# Patient Record
Sex: Female | Born: 1955
Health system: Southern US, Community
[De-identification: ages and names within clinical notes are randomized; demographics above are authoritative.]

## PROBLEM LIST (undated history)

## (undated) DIAGNOSIS — R269 Unspecified abnormalities of gait and mobility: Secondary | ICD-10-CM

## (undated) DIAGNOSIS — K649 Unspecified hemorrhoids: Secondary | ICD-10-CM

## (undated) DIAGNOSIS — R7989 Other specified abnormal findings of blood chemistry: Secondary | ICD-10-CM

## (undated) DIAGNOSIS — E785 Hyperlipidemia, unspecified: Secondary | ICD-10-CM

## (undated) DIAGNOSIS — T7840XA Allergy, unspecified, initial encounter: Secondary | ICD-10-CM

## (undated) DIAGNOSIS — K5790 Diverticulosis of intestine, part unspecified, without perforation or abscess without bleeding: Secondary | ICD-10-CM

## (undated) DIAGNOSIS — K635 Polyp of colon: Secondary | ICD-10-CM

## (undated) DIAGNOSIS — J309 Allergic rhinitis, unspecified: Secondary | ICD-10-CM

## (undated) DIAGNOSIS — E119 Type 2 diabetes mellitus without complications: Secondary | ICD-10-CM

## (undated) DIAGNOSIS — R945 Abnormal results of liver function studies: Secondary | ICD-10-CM

## (undated) DIAGNOSIS — E669 Obesity, unspecified: Secondary | ICD-10-CM

## (undated) DIAGNOSIS — E559 Vitamin D deficiency, unspecified: Secondary | ICD-10-CM

## (undated) DIAGNOSIS — G43909 Migraine, unspecified, not intractable, without status migrainosus: Secondary | ICD-10-CM

## (undated) DIAGNOSIS — K754 Autoimmune hepatitis: Secondary | ICD-10-CM

## (undated) DIAGNOSIS — K759 Inflammatory liver disease, unspecified: Secondary | ICD-10-CM

## (undated) DIAGNOSIS — I10 Essential (primary) hypertension: Secondary | ICD-10-CM

## (undated) DIAGNOSIS — G35 Multiple sclerosis: Secondary | ICD-10-CM

## (undated) DIAGNOSIS — F419 Anxiety disorder, unspecified: Secondary | ICD-10-CM

## (undated) HISTORY — DX: Inflammatory liver disease, unspecified: K75.9

## (undated) HISTORY — DX: Vitamin D deficiency, unspecified: E55.9

## (undated) HISTORY — PX: ABDOMINAL HYSTERECTOMY: SHX81

## (undated) HISTORY — PX: OTHER SURGICAL HISTORY: SHX169

## (undated) HISTORY — DX: Hyperlipidemia, unspecified: E78.5

## (undated) HISTORY — DX: Allergy, unspecified, initial encounter: T78.40XA

## (undated) HISTORY — DX: Other specified abnormal findings of blood chemistry: R79.89

## (undated) HISTORY — DX: Abnormal results of liver function studies: R94.5

## (undated) HISTORY — DX: Unspecified abnormalities of gait and mobility: R26.9

## (undated) HISTORY — DX: Anxiety disorder, unspecified: F41.9

## (undated) HISTORY — DX: Polyp of colon: K63.5

## (undated) HISTORY — DX: Migraine, unspecified, not intractable, without status migrainosus: G43.909

## (undated) HISTORY — PX: TONSILLECTOMY AND ADENOIDECTOMY: SHX28

## (undated) HISTORY — DX: Type 2 diabetes mellitus without complications: E11.9

## (undated) HISTORY — DX: Unspecified hemorrhoids: K64.9

## (undated) HISTORY — DX: Multiple sclerosis: G35

## (undated) HISTORY — DX: Essential (primary) hypertension: I10

## (undated) HISTORY — DX: Allergic rhinitis, unspecified: J30.9

## (undated) HISTORY — DX: Diverticulosis of intestine, part unspecified, without perforation or abscess without bleeding: K57.90

## (undated) HISTORY — DX: Obesity, unspecified: E66.9

## (undated) HISTORY — DX: Autoimmune hepatitis: K75.4

---

## 2000-06-17 ENCOUNTER — Encounter: Payer: Self-pay | Admitting: Obstetrics and Gynecology

## 2000-06-17 ENCOUNTER — Encounter: Admission: RE | Admit: 2000-06-17 | Discharge: 2000-06-17 | Payer: Self-pay | Admitting: Obstetrics and Gynecology

## 2000-12-02 ENCOUNTER — Other Ambulatory Visit: Admission: RE | Admit: 2000-12-02 | Discharge: 2000-12-02 | Payer: Self-pay | Admitting: Obstetrics and Gynecology

## 2000-12-05 ENCOUNTER — Ambulatory Visit (HOSPITAL_COMMUNITY): Admission: RE | Admit: 2000-12-05 | Discharge: 2000-12-05 | Payer: Self-pay | Admitting: Obstetrics and Gynecology

## 2000-12-05 ENCOUNTER — Encounter: Payer: Self-pay | Admitting: Obstetrics and Gynecology

## 2001-09-25 ENCOUNTER — Encounter: Admission: RE | Admit: 2001-09-25 | Discharge: 2001-09-25 | Payer: Self-pay | Admitting: Family Medicine

## 2001-09-25 ENCOUNTER — Encounter: Payer: Self-pay | Admitting: Family Medicine

## 2002-01-19 ENCOUNTER — Encounter: Payer: Self-pay | Admitting: Obstetrics and Gynecology

## 2002-01-19 ENCOUNTER — Encounter: Admission: RE | Admit: 2002-01-19 | Discharge: 2002-01-19 | Payer: Self-pay | Admitting: *Deleted

## 2002-01-29 ENCOUNTER — Other Ambulatory Visit: Admission: RE | Admit: 2002-01-29 | Discharge: 2002-01-29 | Payer: Self-pay | Admitting: Obstetrics and Gynecology

## 2002-03-25 ENCOUNTER — Encounter: Payer: Self-pay | Admitting: Gastroenterology

## 2002-03-25 ENCOUNTER — Ambulatory Visit (HOSPITAL_COMMUNITY): Admission: RE | Admit: 2002-03-25 | Discharge: 2002-03-25 | Payer: Self-pay | Admitting: Gastroenterology

## 2002-07-26 ENCOUNTER — Encounter: Admission: RE | Admit: 2002-07-26 | Discharge: 2002-07-26 | Payer: Self-pay | Admitting: Family Medicine

## 2002-07-26 ENCOUNTER — Encounter: Payer: Self-pay | Admitting: Family Medicine

## 2003-02-14 ENCOUNTER — Encounter: Admission: RE | Admit: 2003-02-14 | Discharge: 2003-02-14 | Payer: Self-pay | Admitting: Family Medicine

## 2003-02-14 ENCOUNTER — Encounter: Payer: Self-pay | Admitting: Family Medicine

## 2004-02-16 ENCOUNTER — Encounter: Admission: RE | Admit: 2004-02-16 | Discharge: 2004-02-16 | Payer: Self-pay | Admitting: Obstetrics and Gynecology

## 2004-06-13 ENCOUNTER — Inpatient Hospital Stay (HOSPITAL_COMMUNITY): Admission: RE | Admit: 2004-06-13 | Discharge: 2004-06-15 | Payer: Self-pay | Admitting: Obstetrics and Gynecology

## 2004-06-13 ENCOUNTER — Encounter (INDEPENDENT_AMBULATORY_CARE_PROVIDER_SITE_OTHER): Payer: Self-pay | Admitting: Specialist

## 2005-02-21 ENCOUNTER — Encounter: Admission: RE | Admit: 2005-02-21 | Discharge: 2005-02-21 | Payer: Self-pay | Admitting: Family Medicine

## 2005-04-05 ENCOUNTER — Encounter: Admission: RE | Admit: 2005-04-05 | Discharge: 2005-04-05 | Payer: Self-pay | Admitting: Obstetrics and Gynecology

## 2005-08-21 ENCOUNTER — Encounter: Admission: RE | Admit: 2005-08-21 | Discharge: 2005-08-21 | Payer: Self-pay | Admitting: Occupational Medicine

## 2006-04-07 ENCOUNTER — Encounter: Admission: RE | Admit: 2006-04-07 | Discharge: 2006-04-07 | Payer: Self-pay | Admitting: Obstetrics and Gynecology

## 2007-05-13 ENCOUNTER — Encounter: Admission: RE | Admit: 2007-05-13 | Discharge: 2007-05-13 | Payer: Self-pay | Admitting: Family Medicine

## 2007-06-17 ENCOUNTER — Emergency Department (HOSPITAL_COMMUNITY): Admission: EM | Admit: 2007-06-17 | Discharge: 2007-06-17 | Payer: Self-pay | Admitting: Emergency Medicine

## 2007-12-18 ENCOUNTER — Encounter: Admission: RE | Admit: 2007-12-18 | Discharge: 2007-12-18 | Payer: Self-pay | Admitting: Family Medicine

## 2008-05-25 ENCOUNTER — Encounter: Admission: RE | Admit: 2008-05-25 | Discharge: 2008-05-25 | Payer: Self-pay | Admitting: Family Medicine

## 2008-06-27 ENCOUNTER — Emergency Department (HOSPITAL_COMMUNITY): Admission: EM | Admit: 2008-06-27 | Discharge: 2008-06-27 | Payer: Self-pay | Admitting: Family Medicine

## 2009-03-29 ENCOUNTER — Ambulatory Visit: Payer: Self-pay | Admitting: Internal Medicine

## 2009-03-29 DIAGNOSIS — R7401 Elevation of levels of liver transaminase levels: Secondary | ICD-10-CM | POA: Insufficient documentation

## 2009-03-29 DIAGNOSIS — Z860100 Personal history of colon polyps, unspecified: Secondary | ICD-10-CM | POA: Insufficient documentation

## 2009-03-29 DIAGNOSIS — R740 Nonspecific elevation of levels of transaminase and lactic acid dehydrogenase [LDH]: Secondary | ICD-10-CM

## 2009-03-29 DIAGNOSIS — K589 Irritable bowel syndrome without diarrhea: Secondary | ICD-10-CM

## 2009-03-29 DIAGNOSIS — K7689 Other specified diseases of liver: Secondary | ICD-10-CM

## 2009-04-11 LAB — CONVERTED CEMR LAB
Iron: 63 ug/dL (ref 42–145)
Saturation Ratios: 24.8 % (ref 20.0–50.0)
Transferrin: 181.1 mg/dL — ABNORMAL LOW (ref 212.0–360.0)

## 2009-06-05 ENCOUNTER — Emergency Department (HOSPITAL_COMMUNITY): Admission: EM | Admit: 2009-06-05 | Discharge: 2009-06-05 | Payer: Self-pay | Admitting: Family Medicine

## 2009-06-07 ENCOUNTER — Encounter: Admission: RE | Admit: 2009-06-07 | Discharge: 2009-06-07 | Payer: Self-pay | Admitting: Family Medicine

## 2009-09-27 ENCOUNTER — Emergency Department (HOSPITAL_COMMUNITY): Admission: EM | Admit: 2009-09-27 | Discharge: 2009-09-27 | Payer: Self-pay | Admitting: Family Medicine

## 2010-02-10 ENCOUNTER — Emergency Department (HOSPITAL_COMMUNITY): Admission: EM | Admit: 2010-02-10 | Discharge: 2010-02-10 | Payer: Self-pay | Admitting: Emergency Medicine

## 2010-06-08 ENCOUNTER — Encounter: Admission: RE | Admit: 2010-06-08 | Discharge: 2010-06-08 | Payer: Self-pay | Admitting: Family Medicine

## 2010-07-18 ENCOUNTER — Encounter (INDEPENDENT_AMBULATORY_CARE_PROVIDER_SITE_OTHER): Payer: Self-pay | Admitting: *Deleted

## 2010-07-30 ENCOUNTER — Encounter (INDEPENDENT_AMBULATORY_CARE_PROVIDER_SITE_OTHER): Payer: Self-pay | Admitting: *Deleted

## 2010-08-01 ENCOUNTER — Ambulatory Visit: Payer: Self-pay | Admitting: Internal Medicine

## 2010-08-28 ENCOUNTER — Ambulatory Visit: Payer: Self-pay | Admitting: Internal Medicine

## 2010-08-29 ENCOUNTER — Encounter: Payer: Self-pay | Admitting: Internal Medicine

## 2010-11-01 NOTE — Letter (Signed)
Summary: [facility] Instructions  [facility]  [address] [place], [place] [address]   Phone: [phone]  Fax: [phone]       Gabriela Rice    [birth year]    MRN: [id number]        Procedure Day /Date: Tuesday 08/28/1010     Arrival Time: 10:00 am      Procedure Time: 11:00 am     Location of Procedure:                    _x _  [facility] (4th Floor)                        PREPARATION FOR COLONOSCOPY WITH MOVIPREP   Starting 5 days prior to your procedure Thursday 11/24 do not eat nuts, seeds, popcorn, corn, beans, peas,  salads, or any raw vegetables.  Do not take any fiber supplements (e.g. Metamucil, Citrucel, and Benefiber).  THE DAY BEFORE YOUR PROCEDURE         DATE: Monday 11/28  1.  Drink clear liquids the entire day-NO SOLID FOOD  2.  Do not drink anything colored red or purple.  Avoid juices with pulp.  No orange juice.  3.  Drink at least 64 oz. (8 glasses) of fluid/clear liquids during the day to prevent dehydration and help the prep work efficiently.  CLEAR LIQUIDS INCLUDE: Water Jello Ice Popsicles Tea (sugar ok, no milk/cream) Powdered fruit flavored drinks Coffee (sugar ok, no milk/cream) Gatorade Juice: apple, white grape, white cranberry  Lemonade Clear bullion, consomm, broth Carbonated beverages (any kind) Strained chicken noodle soup Hard Candy                             4.  In the morning, mix first dose of MoviPrep solution:    Empty 1 Pouch A and 1 Pouch B into the disposable container    Add lukewarm drinking water to the top line of the container. Mix to dissolve    Refrigerate (mixed solution should be used within 24 hrs)  5.  Begin drinking the prep at 5:00 p.m. The MoviPrep container is divided by 4 marks.   Every 15 minutes drink the solution down to the next mark (approximately 8 oz) until the full liter is complete.   6.  Follow completed prep with 16 oz of clear liquid of your choice  (Nothing red or purple).  Continue to drink clear liquids until bedtime.  7.  Before going to bed, mix second dose of MoviPrep solution:    Empty 1 Pouch A and 1 Pouch B into the disposable container    Add lukewarm drinking water to the top line of the container. Mix to dissolve    Refrigerate  THE DAY OF YOUR PROCEDURE      DATE: Tuesday 11/29  Beginning at 6:00 a.m. (5 hours before procedure):         1. Every 15 minutes, drink the solution down to the next mark (approx 8 oz) until the full liter is complete.  2. Follow completed prep with 16 oz. of clear liquid of your choice.    3. You may drink clear liquids until 9:00 am (2 HOURS BEFORE PROCEDURE).   MEDICATION INSTRUCTIONS  Unless otherwise instructed, you should take regular prescription medications with a small sip of water   as early as possible the morning of your  procedure.           OTHER INSTRUCTIONS  You will need a responsible adult at least 55 years of age to accompany you and drive you home.   This person must remain in the waiting room during your procedure.  Wear loose fitting clothing that is easily removed.  Leave jewelry and other valuables at home.  However, you may wish to bring a book to read or  an iPod/MP3 player to listen to music as you wait for your procedure to start.  Remove all body piercing jewelry and leave at home.  Total time from sign-in until discharge is approximately 2-3 hours.  You should go home directly after your procedure and rest.  You can resume normal activities the  day after your procedure.  The day of your procedure you should not:   Drive   Make legal decisions   Operate machinery   Drink alcohol   Return to work  You will receive specific instructions about eating, activities and medications before you leave.    The above instructions have been reviewed and explained to me by   Ezra Sites RN  August 01, 2010 4:52 PM     I fully understand  and can verbalize these instructions _____________________________ Date _________

## 2010-11-01 NOTE — Miscellaneous (Signed)
Summary: [facility]  Clinical Lists Changes  Medications: Added new medication of MOVIPREP 100 GM  SOLR (PEG-KCL-NACL-NASULF-NA ASC-C) As per prep instructions. - Signed Rx of MOVIPREP 100 GM  SOLR (PEG-KCL-NACL-NASULF-NA ASC-C) As per prep instructions.;  #1 x 0;  Signed;  Entered by: Ezra Sites RN;  Authorized by: Hilarie Fredrickson MD;  Method used: Electronically to [facility]*, [address] Shipping/mailing, [place], [place]  [address], Ph: [phone], Fax: [phone] Allergies: Changed allergy or adverse reaction from CODEINE to CODEINE Changed allergy or adverse reaction from PENICILLIN to PENICILLIN    Prescriptions: MOVIPREP 100 GM  SOLR (PEG-KCL-NACL-NASULF-NA ASC-C) As per prep instructions.  #1 x 0   Entered by:   Ezra Sites RN   Authorized by:   Hilarie Fredrickson MD   Signed by:   Ezra Sites RN on 08/01/2010   Method used:   Electronically to        [facility]* (retail)       [address]       [address] Shipping/mailing       [place], [place]  [address]       Ph: [phone]       Fax: [phone]   RxID:   [id number]   Appended Document: Colonoscopy     Procedures Next Due Date:    Colonoscopy: 07/2020  Appended Document: Patient Notice- Polyp Results Letter mailed

## 2010-11-01 NOTE — Procedures (Signed)
Summary: Colonoscopy  Patient: Gabriela Rice Note: All result statuses are Final unless otherwise noted.  Tests: (1) Colonoscopy (COL)   COL Colonoscopy           DONE     [facility]     [address]     [place], [place]  [address]           COLONOSCOPY PROCEDURE REPORT           PATIENT:  Gabriela Rice, Gabriela Rice  MR#:  [id number]     BIRTHDATE:  1956/02/17, 54 yrs. old  GENDER:  female     ENDOSCOPIST:  Wilhemina Bonito. Eda Keys, MD     REF. BY:  Surveillance Program Recall     PROCEDURE DATE:  08/28/2010     PROCEDURE:  Colonoscopy with biopsy,     Colonoscopy with snare polypectomy     x 3     ASA CLASS:  Class II     INDICATIONS:  history of polyps, family history of colon cancer     (aunt), surveillance and high-risk screening ; index exam 07-2004     w/ NAP     MEDICATIONS:   Fentanyl 75 mcg IV, Versed 9 mg IV           DESCRIPTION OF PROCEDURE:   After the risks benefits and     alternatives of the procedure were thoroughly explained, informed     consent was obtained.  Digital rectal exam was performed and     revealed no abnormalities.   The LB 180AL [id number] endoscope was     introduced through the anus and advanced to the cecum, which was     identified by both the appendix and ileocecal valve, without     limitations.Time to cecum = 2:41 min. The quality of the prep was     excellent, using MoviPrep.  The instrument was then slowly     withdrawn (time = 15:47 min) as the colon was fully examined.     <<PROCEDUREIMAGES>>           FINDINGS:  Three polyps, all < 5 mm,  were found in the ascending     colon. Polyps were snared without cautery. Retrieval was     successful. p  A polypoid lesion which appearred to be an inverted     diverticulum was found in the transverse colon. the tip was bx for     confirmation.  Mild diverticulosis was found in the left colon.     Retroflexed views in the rectum revealed internal hemorrhoids.     The scope was then withdrawn from  the patient and the procedure     completed.           COMPLICATIONS:  None     ENDOSCOPIC IMPRESSION:     1) Three polyps in the ascending colon - removed     2) Probable inverted Diverticulum in the transverse colon - bx     3) Mild diverticulosis in the left colon     4) Internal hemorrhoids           RECOMMENDATIONS:     1) Follow up colonoscopy pending pathology results; likely in 5     years           ______________________________     Wilhemina Bonito. Eda Keys, MD           CC:  Shaune Pollack, MD;  The Patient  n.     eSIGNED:   Wilhemina Bonito. Eda Keys at 08/28/2010 12:11 PM           Janyth Pupa, [id number]  Note: An exclamation mark (!) indicates a result that was not dispersed into the flowsheet. Document Creation Date: 08/28/2010 12:11 PM _______________________________________________________________________  (1) Order result status: Final Collection or observation date-time: 08/28/2010 11:52 Requested date-time:  Receipt date-time:  Reported date-time:  Referring Physician:   Ordering Physician: Fransico Setters [phone]) Specimen Source:  Source: Launa Grill Order Number: [id number] Lab site:   Appended Document: Colonoscopy     Procedures Next Due Date:    Colonoscopy: 07/2020

## 2010-11-01 NOTE — Letter (Signed)
Summary: Patient Notice- Polyp Results  [facility]  [address] [place], [place] [address]   Phone: [phone]  Fax: [phone]        August 29, 2010 MRN: [id number]    [facility] [address] [place], [place]  [address]    Dear Ms. Ha,  I am pleased to inform you that the colon polyps removed during your recent colonoscopy were found to be benign (no cancer detected) upon pathologic examination. None of the polyps were precancerous. The single biopsy was also benign.  I recommend you have a repeat colonoscopy examination in 10 years since none of the polyps were precancerous.  Should you develop new or worsening symptoms of abdominal pain, bowel habit changes or bleeding from the rectum or bowels, please schedule an evaluation with either your primary care physician or with me.  Additional information/recommendations:  __ No further action with gastroenterology is needed at this time. Please      follow-up with your primary care physician for your other healthcare      needs.    Please call us if you are having persistent problems or have questions about your condition that have not been fully answered at this time.  Sincerely,  Hilarie Fredrickson MD  This letter has been electronically signed by your physician.  Appended Document: Patient Notice- Polyp Results Letter mailed

## 2010-11-01 NOTE — Letter (Signed)
Summary: Pre Visit Letter Revised  [facility]  [address] [place], [place] [address]   Phone: [phone]  Fax: [phone]        07/18/2010 MRN: [id number] Gabriela Rice [address] [place], [place]  [address]             Procedure Date:  08/28/2010   Welcome to the Gastroenterology Division at [facility].    You are scheduled to see a nurse for your pre-procedure visit on 08/01/2010 at 4:30PM on the 3rd floor at [facility], [address]  We ask that you try to arrive at our office 15 minutes prior to your appointment time to allow for check-in.  Please take a minute to review the attached form.  If you answer "Yes" to one or more of the questions on the first page, we ask that you call the person listed at your earliest opportunity.  If you answer "No" to all of the questions, please complete the rest of the form and bring it to your appointment.    Your nurse visit will consist of discussing your medical and surgical history, your immediate family medical history, and your medications.   If you are unable to list all of your medications on the form, please bring the medication bottles to your appointment and we will list them.  We will need to be aware of both prescribed and over the counter drugs.  We will need to know exact dosage information as well.    Please be prepared to read and sign documents such as consent forms, a financial agreement, and acknowledgement forms.  If necessary, and with your consent, a friend or relative is welcome to sit-in on the nurse visit with you.  Please bring your insurance card so that we may make a copy of it.  If your insurance requires a referral to see a specialist, please bring your referral form from your primary care physician.  No co-pay is required for this nurse visit.     If you cannot keep your appointment, please call [phone] to cancel or reschedule prior to your appointment date.  This  allows [place] the opportunity to schedule an appointment for another patient in need of care.    Thank you for choosing [facility] for your medical needs.  We appreciate the opportunity to care for you.  Please visit [place] at our website  to learn more about our practice.  Sincerely, The Gastroenterology Division

## 2010-12-18 LAB — URINE CULTURE

## 2010-12-18 LAB — POCT URINALYSIS DIP (DEVICE)
Bilirubin Urine: NEGATIVE
Glucose, UA: NEGATIVE mg/dL
Ketones, ur: NEGATIVE mg/dL
Nitrite: NEGATIVE
Protein, ur: 30 mg/dL — AB
Specific Gravity, Urine: 1.025 (ref 1.005–1.030)
pH: 5.5 (ref 5.0–8.0)

## 2011-01-04 LAB — URINE CULTURE: Colony Count: >=100000

## 2011-01-04 LAB — POCT URINALYSIS DIP (DEVICE)
Bilirubin Urine: NEGATIVE
Glucose, UA: 100 mg/dL — AB
Ketones, ur: NEGATIVE mg/dL
Nitrite: POSITIVE — AB
Specific Gravity, Urine: 1.015 (ref 1.005–1.030)

## 2011-02-15 NOTE — H&P (Signed)
NAME:  Gabriela Rice, Gabriela Rice                       ACCOUNT NO.:  [id number]   MEDICAL RECORD NO.:  [id number]                   PATIENT TYPE:  INP   LOCATION:  NA                                   FACILITY:  [facility]   PHYSICIAN:  Malachi Pro. Ambrose Mantle, M.D.              DATE OF BIRTH:  [birth year]   DATE OF ADMISSION:  06/13/2004  DATE OF DISCHARGE:                                HISTORY & PHYSICAL   HISTORY OF PRESENT ILLNESS:  This is a 55 year old white female para 2-0-2-  2, who is admitted to the hospital for abdominal hysterectomy and bilateral  salpingo-oophorectomy because of leiomyomata uteri, menorrhagia, and  dysmenorrhea.  Last menstrual period was June 05, 2004.  Periods occur  at approximately 22 to 27 day intervals,  last seven days, and are heavy.  She states that she uses 8 to 10 pads plus 8 to 10 tampons per day.  She  uses two overnight pads at night and gets up to change at night and passes  egg sized clots.  She rates her menstrual cramps at 7 to 8/10.  An  ultrasound done on May 15, 2005, showed multiple uterine fibroids.  The  uterus measured 14.4 x 7.4 x 9.3 cm.  The patient has undergone a previous  multiple myomectomy and is admitted now for hysterectomy.   ALLERGIES:  1.  PENICILLIN.  2.  CODEINE.   PAST SURGICAL HISTORY:  1.  Two cesarean sections.  2.  Multiple myomectomies.  3.  Two terminations of pregnancy.   PAST MEDICAL HISTORY:  1.  Multiple sclerosis.  2.  Elevated cholesterol.  3.  Chronic liver problems that have been attributed to fatty liver.  She      had a liver biopsy and has abnormal liver function tests.   She reports that her sex drive was an 6/06, at age 6, continued to be 8/10  at 65, but now is a 3/10.  She feels like the decreased sex drive is mainly  related to fatigue, quite likely related to multiple sclerosis.   MEDICATIONS:  1.  ___________.  2.  Xanax.  3.  Advil.  4.  Benadryl.   FAMILY HISTORY:  Mother 21 with  multiple sclerosis.  Father died at 68 of  cirrhosis of the liver.  Two sisters, one with hepatitis C, one brother is  in normal health, but is disabled with back problems.   PHYSICAL EXAMINATION:  GENERAL:  A well-developed, somewhat obese, white  female in no acute distress.  VITAL SIGNS:  Weight 215.25 pounds, blood pressure 130/80, pulse 80.  HEENT:  She has a narrow mouth, no major abnormalities of the mouth.  Extraocular movements were intact.  Nose and pharynx are clear.  NECK:  Supple without thyromegaly.  BREASTS:  Soft without masses.  LUNGS:  Clear to auscultation and percussion.  HEART:  Normal size and sounds, no murmurs.  ABDOMEN:  Soft.  There is a lower abdominal mass palpable and a midline  abdominal scar.  PELVIC:  The vulva and vagina are clean.  The cervix is clean.  A slight  amount of menstrual blood present.  Uterus is hard to feel vaginally.  The  adnexa are clear.  RECTAL:  Rectal examination done in August was negative.   LABORATORY DATA:  Pap smear on May 10, 2004, was negative for  intraepithelial lesion and malignancy.   IMPRESSION:  1.  Leiomyomata.  2.  Menorrhagia.  3.  Dysmenorrhea.   PLAN:  The patient is admitted for abdominal hysterectomy and bilateral  salpingo-oophorectomy.  She understands the risks of surgery, included but  are not limited to heart attack, stroke, pulmonary embolus, wound  disruption, hemorrhage with need for re-operation and/or transfusion,  fistula formation, nerve injury, and intestinal obstruction.  She  understands and agrees to proceed.                                               Malachi Pro. Ambrose Mantle, M.D.    TFH/MEDQ  D:  06/12/2004  T:  06/12/2004  Job:  [id number]

## 2011-02-15 NOTE — Discharge Summary (Signed)
NAME:  Gabriela Rice, Gabriela Rice                       ACCOUNT NO.:  [id number]   MEDICAL RECORD NO.:  [id number]                   PATIENT TYPE:  INP   LOCATION:  0454                                 FACILITY:  [facility]   PHYSICIAN:  Malachi Pro. Ambrose Mantle, M.D.              DATE OF BIRTH:  1956/07/02   DATE OF ADMISSION:  06/13/2004  DATE OF DISCHARGE:  06/15/2004                                 DISCHARGE SUMMARY   This is a 55 year old white female admitted for abdominal  hysterectomy/bilateral salpingo-oophorectomy because of leiomyomata uteri,  dysmenorrhea, and menorrhagia.  She underwent that procedure with division  of pelvic adhesions on June 13, 2004, did well postoperatively, and was  discharged on the second postoperative day.  On the first postoperative day,  she did have a temperature maximum of 100.4 degrees.  She was asked to  increase her pulmonary toilet with incentive spirometry and with coughing  and deep breathing.  She has remained afebrile now and is ready for  discharge although she is strongly encouraged to watch her temperature four  times a day and call if it is above 100.4 degrees.  Her abdomen is soft and  nontender.  She has not passed flatus.  She is told to take clear liquids or  full liquids until she is passing flatus and she may use a Dulcolax  suppository.  Initial hemoglobin 11, hematocrit 33.5, white count 6800,  platelet count 322,000.  There is a normal differential.  A comprehensive  metabolic profile showed a glucose of 120, creatinine of 0.8, AST of 61, ALT  of 72 but the patient has had abnormal liver function tests.  She has been  evaluated by Dr. Kinnie Scales and had a liver biopsy that showed fatty liver.  Urinalysis was negative.  An EKG showed a moderate voltage criteria for LVH,  borderline EKG.  No previous tracing.  Pathology report showed a 566 g  uterus with benign cervix and chronic cervicitis, squamous metaplasia and  focal microglandular  hyperplasia, benign proliferative endometrium and no  hyperplasia, leiomyomata uteri, serosal fibrous adhesions with focal  endosalpingiosis.  Bilateral ovaries and fallopian tubes with benign ovarian  follicular cysts, benign ovarian serous cystadenoma, benign paratubal cysts,  and fibrous adhesions.   FINAL DIAGNOSES:  1.  Leiomyomata uteri.  2.  Menorrhagia.  3.  Dysmenorrhea.  4.  Serous cystadenoma of the ovary.   OPERATION:  Abdominal hysterectomy, bilateral salpingo-oophorectomy,  division of adhesions.   FINAL CONDITION:  Improved.   INSTRUCTIONS:  1.  Liquid diet until passing flatus.  2.  Call with any temperature elevation greater than 100.4 or 100.6 degrees.  3.  Call with any unusual problems.  4.  No heavy lifting or strenuous activity.  5.  No vaginal entrance.   DISCHARGE MEDICATIONS:  Mepergan Fortis 24 tablets 1 every 4-6 hours as  needed for pain and the patient is advised to continue her medications she  takes at home.   She is to return in five days to have her staples removed.      TFH/MEDQ  D:  06/15/2004  T:  06/16/2004  Job:  [id number]

## 2011-02-15 NOTE — Op Note (Signed)
NAME:  Gabriela Rice, Gabriela Rice                       ACCOUNT NO.:  [id number]   MEDICAL RECORD NO.:  [id number]                   PATIENT TYPE:  INP   LOCATION:  0454                                 FACILITY:  [facility]   PHYSICIAN:  Malachi Pro. Ambrose Mantle, M.D.              DATE OF BIRTH:  06/08/1956   DATE OF PROCEDURE:  06/13/2004  DATE OF DISCHARGE:                                 OPERATIVE REPORT   PREOPERATIVE DIAGNOSES:  1.  Leiomyoma uteri.  2.  Dysmenorrhea.  3.  Menorrhagia.   POSTOPERATIVE DIAGNOSES:  1.  Leiomyoma uteri.  2.  Dysmenorrhea.  3.  Menorrhagia.  4.  Some pelvic adhesions.   OPERATION:  Abdominal hysterectomy, bilateral salpingo-oophorectomy.   SURGEON:  Malachi Pro. Ambrose Mantle, M.D.   ASSISTANT:  Zenaida Niece, M.D.   General anesthesia.   The patient was brought to the operating room and placed under satisfactory  general anesthesia.  The abdomen, vulva, vagina, and urethra were prepped  with Betadine solution and a Foley catheter was inserted to straight drain.  Exam revealed a mass that extended to the umbilicus; otherwise, I could not  feel the pelvic structures well.  The cervix itself was closed.  The patient  was placed supine.  The patient had a prior midline incision.  Incision was  made through this old scar from the lower abdominal crease all the way  almost to the umbilicus.  The subcutaneous tissue was separated from the  fascia throughout the length of the incision.  The fascia was incised, the  midline was identified and developed.  The peritoneum was opened vertically.  Exploration of the upper abdomen revealed the liver to be smooth.  I did not  feel any abnormalities of the liver.  She had had some abnormal liver  function tests a liver biopsy showed to be fatty liver.  Both kidneys felt  normal.  Exploration of the pelvis revealed that the uterus came to the  umbilicus.  Both tubes and ovaries appeared relatively normal although there  were  multiple cysts on the surface of both ovaries, but nothing to suggest  malignancy.  The tubes had been previously ligated.  There were some  adhesions around both infundibulopelvic ligaments.  Packs and retractors  were used for exposure.  The upper pedicles were clamped across.  The right  round ligament was divided with the electrical current.  The left round  ligament was divided between [facility] clamps and suture ligated.  The bladder  flap was developed, although there was some scarring over the anterior lower  uterine segment, probably from her previous tubal ligations.  The right  infundibulopelvic ligament was divided and doubly suture ligated.  The left  infundibulopelvic ligament required two clamps to divide it, and then it was  suture ligated.  The uterine vessels were skeletonized as well as could be,  but exposure was difficult because the uterus seemed to reach  from sidewall  to sidewall, plus the patient's bladder was quite floppy and kept trying to  flop into the operative field.  Both uterine vessels were clamped, cut, and  suture ligated.  The parametrial and paracervical tissues were clamped, cut,  and suture ligated.  The uterosacral ligaments were clamped, cut, and suture  ligated and held.  The right vaginal angle was entered and the uterus and  tubes and ovaries were removed by transecting the upper vagina.  Vaginal  angle sutures were placed and then the central portion of the vagina was  closed with interrupted figure-of-eight sutures of 0 Vicryl.  It should be  noted that this procedure was quite difficult because the patient's bladder  was quite floppy and kept trying to enter the operative field.  It required  two Deaver retractors and the self-retaining bladder blade to try to keep it  at bay.  A 3-0 Vicryl suture was used for hemostasis close to the right  uterosacral ligament.  Liberal irrigation confirmed hemostasis.  The  uterosacral ligaments were sutured  together in the midline. The bladder was  irrigated with methylene blue-stained fluid.  No leakage was noted, and  there was good distention of the bladder.  Both ureters were palpated and  felt to be well away from the operative field.  They were of normal size and  shape and course.  All packs and retractors were removed.  The abdominal  wall was closed with interrupted Marcello Moores sutures of #1 Novofil, going  through the subcutaneous tissue, fascia, rectus muscle, and peritoneum on  both sides and then incorporating the fascia in the second bite.  At the end  of the closure there did not seem to be any gaps in the fascial closure.  The subcutaneous tissue was closed with a running 3-0 Vicryl and the skin  was closed with automatic staples.  The patient seemed to tolerate the  procedure well.  Blood loss was estimated at 400 mL.  Sponge and needle  counts were correct and the patient was returned to recovery in satisfactory  condition.                                               Malachi Pro. Ambrose Mantle, M.D.    TFH/MEDQ  D:  06/13/2004  T:  06/13/2004  Job:  [id number]

## 2011-05-07 ENCOUNTER — Other Ambulatory Visit: Payer: Self-pay | Admitting: Family Medicine

## 2011-05-07 DIAGNOSIS — Z1231 Encounter for screening mammogram for malignant neoplasm of breast: Secondary | ICD-10-CM

## 2011-06-10 ENCOUNTER — Ambulatory Visit
Admission: RE | Admit: 2011-06-10 | Discharge: 2011-06-10 | Disposition: A | Discharge status: Home / Self Care | Payer: Managed Care, Other (non HMO) | Source: Ambulatory Visit | Attending: Family Medicine | Admitting: Family Medicine

## 2011-06-10 DIAGNOSIS — Z1231 Encounter for screening mammogram for malignant neoplasm of breast: Secondary | ICD-10-CM

## 2011-07-01 LAB — POCT URINALYSIS DIP (DEVICE)
Bilirubin Urine: NEGATIVE
Glucose, UA: NEGATIVE
Ketones, ur: NEGATIVE
Nitrite: NEGATIVE
Operator id: 235561
Protein, ur: NEGATIVE
Specific Gravity, Urine: <=1.005
Urobilinogen, UA: 0.2
pH: 6

## 2011-07-01 LAB — URINE CULTURE: Colony Count: >=100000

## 2012-05-12 ENCOUNTER — Other Ambulatory Visit: Payer: Self-pay | Admitting: Family Medicine

## 2012-05-12 DIAGNOSIS — Z1231 Encounter for screening mammogram for malignant neoplasm of breast: Secondary | ICD-10-CM

## 2012-06-10 ENCOUNTER — Ambulatory Visit: Payer: Managed Care, Other (non HMO)

## 2012-06-29 ENCOUNTER — Ambulatory Visit: Payer: Managed Care, Other (non HMO)

## 2012-07-03 ENCOUNTER — Ambulatory Visit
Admission: RE | Admit: 2012-07-03 | Discharge: 2012-07-03 | Disposition: A | Discharge status: Home / Self Care | Payer: BC Managed Care – PPO | Source: Ambulatory Visit | Attending: Family Medicine | Admitting: Family Medicine

## 2012-07-03 DIAGNOSIS — Z1231 Encounter for screening mammogram for malignant neoplasm of breast: Secondary | ICD-10-CM

## 2012-10-14 DIAGNOSIS — Z79899 Other long term (current) drug therapy: Secondary | ICD-10-CM | POA: Insufficient documentation

## 2012-10-14 DIAGNOSIS — G35 Multiple sclerosis: Secondary | ICD-10-CM | POA: Insufficient documentation

## 2013-02-03 ENCOUNTER — Encounter: Payer: Self-pay | Admitting: Internal Medicine

## 2013-02-04 ENCOUNTER — Ambulatory Visit (INDEPENDENT_AMBULATORY_CARE_PROVIDER_SITE_OTHER): Payer: BC Managed Care – PPO | Admitting: Internal Medicine

## 2013-02-04 ENCOUNTER — Ambulatory Visit (INDEPENDENT_AMBULATORY_CARE_PROVIDER_SITE_OTHER)
Admission: RE | Admit: 2013-02-04 | Discharge: 2013-02-04 | Disposition: A | Discharge status: Home / Self Care | Payer: BC Managed Care – PPO | Source: Ambulatory Visit | Attending: Internal Medicine | Admitting: Internal Medicine

## 2013-02-04 ENCOUNTER — Encounter: Payer: Self-pay | Admitting: Internal Medicine

## 2013-02-04 VITALS — BP 148/82 | HR 96 | Temp 97.7°F | Ht 64.0 in | Wt 197.6 lb

## 2013-02-04 DIAGNOSIS — R0600 Dyspnea, unspecified: Secondary | ICD-10-CM

## 2013-02-04 DIAGNOSIS — R0989 Other specified symptoms and signs involving the circulatory and respiratory systems: Secondary | ICD-10-CM

## 2013-02-04 DIAGNOSIS — R0902 Hypoxemia: Secondary | ICD-10-CM

## 2013-02-04 NOTE — Patient Instructions (Addendum)
Please see patient coordinator before you leave today  to schedule overnight oximetry Room air  Please remember to go to the  x-ray department downstairs for your tests - we will call you with the results when they are available.  Please schedule a follow up office visit in 4 weeks, sooner if needed with pfts

## 2013-02-04 NOTE — Progress Notes (Signed)
  Subjective:    Patient ID: Gabriela Rice, female    DOB: July 17, 1956  MRN: [id number]  HPI  89 yowf quit  Mild smoking 1997 no resp problems with MS and chronic fatigue and doe starting around 2013 referred 02/03/13 to pulmonary clinic by Dr Shaune Pollack.  02/04/2013 1st pulmonary eval cc indolent onset progressive doe x 1 year to point where has to lean on cart when walks at grocery store assoc with new cough x sev months which  typically happens this time every year but this year more of a dry  daytime than a first thiing  in am pattern prod cough.   Ms worse x 3 years, f/u Lesia Sago. No dysphagia.  No obvious daytime variabilty or assoc chronic cough or cp or chest tightness, subjective wheeze overt sinus or hb symptoms. No unusual exp hx or h/o childhood pna/ asthma or premature birth to her knowledge.   Current Medications, Allergies, Past Medical History, Past Surgical History, Family History, and Social History were reviewed in [employer] record.  ROS  The following are not active complaints unless bolded sore throat, dysphagia, dental problems, itching, sneezing,  nasal congestion or excess/ purulent secretions, ear ache,   fever, chills, sweats, unintended wt loss, pleuritic or exertional cp, hemoptysis,  orthopnea pnd or leg swelling, presyncope, palpitations, heartburn, abdominal pain, anorexia, nausea, vomiting, diarrhea  or change in bowel or urinary habits, change in stools or urine, dysuria,hematuria,  rash, arthralgias, visual complaints, headache, numbness weakness or ataxia or problems with walking or coordination,  change in mood/affect or memory.        Review of Systems  Constitutional: Negative for fever, chills and unexpected weight change.  HENT: Positive for congestion and sneezing. Negative for ear pain, nosebleeds, sore throat, rhinorrhea, trouble swallowing, dental problem, voice change, postnasal drip and sinus pressure.   Eyes: Negative for  visual disturbance.  Respiratory: Positive for cough and shortness of breath. Negative for choking.   Cardiovascular: Negative for chest pain and leg swelling.  Gastrointestinal: Negative for vomiting, abdominal pain and diarrhea.  Genitourinary: Negative for difficulty urinating.  Musculoskeletal: Negative for arthralgias.  Skin: Negative for rash.  Neurological: Negative for tremors, syncope and headaches.  Hematological: Does not bruise/bleed easily.       Objective:   Physical Exam  amb wf nad Wt Readings from Last 3 Encounters:  02/04/13 197 lb 9.6 oz (89.631 kg)  03/29/09 203 lb 6.4 oz (92.262 kg)    HEENT: nl dentition, turbinates, and orophanx. Nl external ear canals without cough reflex   NECK :  without JVD/Nodes/TM/ nl carotid upstrokes bilaterally   LUNGS: no acc muscle use, clear to A and P bilaterally without cough on insp or exp maneuvers   CV:  RRR  no s3 or murmur or increase in P2, no edema   ABD:  soft and nontender with nl excursion in the supine position. No bruits or organomegaly, bowel sounds nl  MS:  warm without deformities, calf tenderness, cyanosis or clubbing  SKIN: warm and dry without lesions    NEURO:  alert, approp, no deficits    CXR  02/04/2013 : Prominent bronchitic changes with bibasilar atelectasis           Assessment & Plan:

## 2013-02-05 NOTE — Assessment & Plan Note (Signed)
-   See sleep study 01/13/13  Neg for sign osa but did desat RA  - ONO ra ordered  02/04/2013  >>>  Most likely this is on the basis of obesity and perhaps MS related muscle weakness, needs ono RA to confirm and qualify for 02

## 2013-02-05 NOTE — Assessment & Plan Note (Addendum)
-   02/04/2013  Walked RA x 2 laps @ 185 ft each stopped due to  Fatigue not sob sat 92%  This is probably combination of deconditioning/ ms/ atelectasis from obesity but need further evaluation with pft's before go further with the w/u  Concerned about the chronic use of macrodantin in this setting but no def ILD on cxr and did not desat with exertion so probably ok to continue for now.

## 2013-02-05 NOTE — Progress Notes (Signed)
Quick Note:  Spoke with pt and notified of results per Dr. Wert. Pt verbalized understanding and denied any questions.  ______ 

## 2013-02-09 ENCOUNTER — Telehealth: Payer: Self-pay | Admitting: Internal Medicine

## 2013-02-09 NOTE — Telephone Encounter (Signed)
Spoke with patient patient inquiring on ONO, this has not been set up yet and she has not heard from anyone  Spoke with Selena Batten at [facility] She stated she was going to check on status of this (as it was to be done yesterday) and if has not been done they would get it done TODAY  Informed patient she should be receiving a call today from [facility] to set this up, Informed patient that if anything else is needed to give [place] a call patient verbalized understanding and nothing further needed at this time

## 2013-02-11 ENCOUNTER — Ambulatory Visit
Admission: RE | Admit: 2013-02-11 | Discharge: 2013-02-11 | Disposition: A | Discharge status: Home / Self Care | Payer: BC Managed Care – PPO | Source: Ambulatory Visit | Attending: Family Medicine | Admitting: Family Medicine

## 2013-02-11 ENCOUNTER — Telehealth: Payer: Self-pay | Admitting: Internal Medicine

## 2013-02-11 ENCOUNTER — Other Ambulatory Visit: Payer: Self-pay | Admitting: Family Medicine

## 2013-02-11 DIAGNOSIS — R109 Unspecified abdominal pain: Secondary | ICD-10-CM

## 2013-02-11 NOTE — Telephone Encounter (Signed)
Pts LFT's have been rising and pt had an ultrasound that suggest hepatitis. Request pt be seen asap. Pt scheduled to see Doug Sou PA 02/12/13@2 :15pm. Records have been faxed.

## 2013-02-12 ENCOUNTER — Encounter: Payer: Self-pay | Admitting: Gastroenterology

## 2013-02-12 ENCOUNTER — Ambulatory Visit (INDEPENDENT_AMBULATORY_CARE_PROVIDER_SITE_OTHER): Payer: BC Managed Care – PPO | Admitting: Gastroenterology

## 2013-02-12 ENCOUNTER — Other Ambulatory Visit (INDEPENDENT_AMBULATORY_CARE_PROVIDER_SITE_OTHER): Payer: BC Managed Care – PPO

## 2013-02-12 VITALS — BP 140/82 | HR 111 | Ht 63.75 in | Wt 194.4 lb

## 2013-02-12 DIAGNOSIS — R1011 Right upper quadrant pain: Secondary | ICD-10-CM | POA: Insufficient documentation

## 2013-02-12 DIAGNOSIS — R7989 Other specified abnormal findings of blood chemistry: Secondary | ICD-10-CM

## 2013-02-12 DIAGNOSIS — R110 Nausea: Secondary | ICD-10-CM

## 2013-02-12 MED ORDER — ONDANSETRON HCL 4 MG PO TABS: ORAL_TABLET | ORAL | Status: DC

## 2013-02-12 NOTE — Patient Instructions (Addendum)
Please go to the basement level to have your labs drawn.  We will call you with the results.  We sent a prescription for Zofran 4 mg to  [facility].

## 2013-02-12 NOTE — Progress Notes (Signed)
HPI

## 2013-02-12 NOTE — Progress Notes (Signed)
02/12/2013 Gabriela Rice [id number] [birth year]   HISTORY OF PRESENT ILLNESS:  Patient is a pleasant 57 year old female who is a patient of Dr. Lamar Sprinkles.  She has been complaining of nausea (1-2 months) and RUQ abdominal pain (has this for quite some time).  PCP checked ultrasound for gallbladder evaluation; gallbladder was normal but study suggested hepatitis.  Liver enzymes are very high (AST 839, ALT 719, ALP 228, and total bili 5.4).  Hepatitis A, B, and C were negative.  Iron studies were normal (says that her brother has hemachromatosis).  Alpha 1 anti-trypsin was normal.  LFT's have been elevated in the past and have been monitored because they also go up when she takes statins, etc but have never been anywhere near this high.    Father died of cirrhosis, but was a heavy drinker.  Patient does not drink ETOH.  No new medications except for the Tecfidera, which she has been taking for almost a year (she says that it isn't supposed to cause LFT elevation and PI says that it can cause AST elevation).  No OTC meds/herbs, etc.  She says that she had a liver biopsy several years ago when she was seeing Dr. Troy Sine, but we do not have the results of that at this time (she says that it was normal to her knowledge).  She is asking about Lyme disease as well because her husband is a Therapist, nutritional and has been bringing in a lot of ticks.   Past Medical History  Diagnosis Date  . DM (diabetes mellitus)   . Hyperlipidemia   . Multiple sclerosis   . Allergic rhinitis   . Elevated LFTs   . Obesity   . Vitamin D deficiency    Past Surgical History  Procedure Laterality Date  . Myoectomy    . Cesarean section      x 2    reports that she quit smoking about 17 years ago. Her smoking use included Cigarettes. She has a 1 pack-year smoking history. She has never used smokeless tobacco. She reports that she does not drink alcohol or use illicit drugs. family history includes Brain cancer in her maternal uncle;  Colon cancer in her maternal aunt; Heart disease in her maternal grandmother; and Uterine cancer in her maternal aunt. Allergies  Allergen Reactions  . Codeine     REACTION: hands swell  . Penicillins     REACTION: seizure as child      Outpatient Encounter Prescriptions as of 02/12/2013  Medication Sig Dispense Refill  . ALPRAZolam (XANAX) 1 MG tablet Take 0.5-1 mg by mouth as needed for sleep.      Marland Kitchen aspirin 81 MG tablet Take 81 mg by mouth daily.      . Cholecalciferol (VITAMIN D) 2000 UNITS CAPS Take 1 capsule by mouth daily.      . Dimethyl Fumarate (TECFIDERA) 240 MG CPDR Take 1 capsule by mouth 2 (two) times daily.      . nitrofurantoin (MACRODANTIN) 100 MG capsule Take 100 mg by mouth daily.      . polyethylene glycol (MIRALAX / GLYCOLAX) packet Take 17 g by mouth daily.       No facility-administered encounter medications on file as of 02/12/2013.     REVIEW OF SYSTEMS  : All other systems reviewed and negative except where noted in the History of Present Illness.   PHYSICAL EXAM: BP 140/82  Pulse 111  Ht 5' 3.75" (1.619 m)  Wt 194 lb 6 oz (  88.168 kg)  BMI 33.64 kg/m2 General: Well developed white female in no acute distress Head: Normocephalic and atraumatic Eyes:  sclerae anicteric,conjunctive pink. Ears: Normal auditory acuity  Lungs: Clear throughout to auscultation Heart: Tachy but regular rhythm Abdomen: Soft, nontender, non-distended. No masses or hepatomegaly noted. Normal bowel sounds. Musculoskeletal: Symmetrical with no gross deformities  Skin: No lesions on visible extremities Extremities: No edema  Neurological: Alert oriented x 4, grossly non-focal. Psychological:  Alert and cooperative. Normal mood and affect  ASSESSMENT AND PLAN: -Elevated LFT's with nausea and RUQ abdominal pain.  Ultrasound suggested hepatitis.  Will complete evaluation with ANA, ASMA, AMA, ceruloplasmin, celiac titers, Malachi Carl, and lyme studies.  Zofran for nausea.

## 2013-02-13 LAB — CERULOPLASMIN: Ceruloplasmin: 46 mg/dL (ref 20–60)

## 2013-02-15 ENCOUNTER — Encounter: Payer: Self-pay | Admitting: Internal Medicine

## 2013-02-15 LAB — ANTI-SMOOTH MUSCLE ANTIBODY, IGG: Smooth Muscle Ab: 84 U — ABNORMAL HIGH (ref <20)

## 2013-02-16 ENCOUNTER — Encounter: Payer: Self-pay | Admitting: Neurology

## 2013-02-16 ENCOUNTER — Ambulatory Visit (INDEPENDENT_AMBULATORY_CARE_PROVIDER_SITE_OTHER): Payer: BC Managed Care – PPO | Admitting: Neurology

## 2013-02-16 ENCOUNTER — Telehealth: Payer: Self-pay | Admitting: *Deleted

## 2013-02-16 VITALS — BP 137/86 | HR 108 | Wt 192.0 lb

## 2013-02-16 DIAGNOSIS — R269 Unspecified abnormalities of gait and mobility: Secondary | ICD-10-CM | POA: Insufficient documentation

## 2013-02-16 DIAGNOSIS — Z79899 Other long term (current) drug therapy: Secondary | ICD-10-CM

## 2013-02-16 DIAGNOSIS — G35 Multiple sclerosis: Secondary | ICD-10-CM

## 2013-02-16 HISTORY — DX: Unspecified abnormalities of gait and mobility: R26.9

## 2013-02-16 LAB — LYME DISEASE AB, QUANT, IGM: Lyme Disease Ab, Quant, IgM: <0.91 index (ref 0.00–0.90)

## 2013-02-16 NOTE — Telephone Encounter (Signed)
I called patient. The patient continues to have a workup for her hepatitis. She will contact me when she has more information concerning this.

## 2013-02-16 NOTE — Telephone Encounter (Signed)
Patient called stating the physician wanted her to callback once she has the information.

## 2013-02-16 NOTE — Progress Notes (Signed)
Reason for visit: Multiple sclerosis  Gabriela Rice is an 57 y.o. female  History of present illness:  Gabriela Rice is a 57 year old right-handed white female with a history of multiple sclerosis. The patient has been on Tecfidera, and she has been tolerating the medication fairly well. The patient reports that her liver enzymes have gone up recently. The patient is having episodes of tachycardia at nighttime, but a Holter monitor study did not show any abnormalities. The patient has been noted to have elevated anti-smooth muscle antibodies, and the possibility of autoimmune hepatitis is being considered. The patient is undergoing workup currently for this. Hepatitis viral screens have been negative. The patient indicates that she continues to have some problems with walking, and she will fall on occasion. The patient uses a cane for ambulation. The patient had some problems with constipation. The patient was on Crestor for her cholesterol, but this has been discontinued. The patient feels weak in general. The patient returns for an evaluation. The patient lost about 10 pounds of weight since last seen. The patient feels nauseated.  Past Medical History  Diagnosis Date  . DM (diabetes mellitus)   . Hyperlipidemia   . Multiple sclerosis   . Allergic rhinitis   . Elevated LFTs   . Obesity   . Vitamin D deficiency   . Abnormality of gait 02/16/2013    Past Surgical History  Procedure Laterality Date  . Myoectomy    . Cesarean section      x 2    Family History  Problem Relation Age of Onset  . Heart disease Maternal Grandmother   . Colon cancer Maternal Aunt   . Uterine cancer Maternal Aunt   . Brain cancer Maternal Uncle   . Diabetes Mother   . Multiple sclerosis Mother     Social history:  reports that she quit smoking about 17 years ago. Her smoking use included Cigarettes. She has a 1 pack-year smoking history. She has never used smokeless tobacco. She reports that she  does not drink alcohol or use illicit drugs.  Allergies:  Allergies  Allergen Reactions  . Codeine     REACTION: hands swell  . Penicillins     REACTION: seizure as child    Medications:  Current Outpatient Prescriptions on File Prior to Visit  Medication Sig Dispense Refill  . ALPRAZolam (XANAX) 1 MG tablet Take 0.5-1 mg by mouth as needed for sleep.      Marland Kitchen aspirin 81 MG tablet Take 81 mg by mouth daily.      . Cholecalciferol (VITAMIN D) 2000 UNITS CAPS Take 1 capsule by mouth daily.      . Dimethyl Fumarate (TECFIDERA) 240 MG CPDR Take 1 capsule by mouth 2 (two) times daily.      . nitrofurantoin (MACRODANTIN) 100 MG capsule Take 100 mg by mouth daily.      . ondansetron (ZOFRAN) 4 MG tablet Take 1 tab every 6 hours as needed for nausea.  20 tablet  0  . polyethylene glycol (MIRALAX / GLYCOLAX) packet Take 17 g by mouth daily.       No current facility-administered medications on file prior to visit.    ROS:  Out of a complete 14 system review of symptoms, the patient complains only of the following symptoms, and all other reviewed systems are negative.  Weight loss, fatigue Palpitations of the heart Visual blurring Cough Constipation Urinary frequency, occasional incontinence Feeling hot Joint pain Allergies Memory loss, numbness, weakness, dizziness  Anxiety, insomnia  Blood pressure 137/86, pulse 108, weight 192 lb (87.091 kg).  Physical Exam  General: The patient is alert and cooperative at the time of the examination.  Skin: No significant peripheral edema is noted.   Neurologic Exam  Cranial nerves: Facial symmetry is present. Speech is normal, no aphasia or dysarthria is noted. Extraocular movements are full. Visual fields are full. Pupils are equal, round, and reactive to light. Discs are flat bilaterally.  Motor: The patient has good strength in all 4 extremities, with exception that the patient has slight left greater than right proximal weakness of  the lower extremities.  Coordination: The patient has good finger-nose-finger and heel-to-shin bilaterally.  Gait and station: The patient has a slightly wide-based, unsteady gait. The patient uses a cane for ambulation. Tandem gait is unsteady. Romberg is negative. No drift is seen.  Reflexes: Deep tendon reflexes are symmetric.   Assessment/Plan:  One. Multiple sclerosis  2. Gait disorder  3. Elevated liver enzymes  The patient will be taken off of Tecfidera until her hepatitis issues have been resolved. The patient will followup in about 4 months. We will consider repeat MRI evaluations of the brain and cervical spine at that time. If it is felt that the patient does not have hepatitis on the basis of a medication, Tecfidera can be restarted in the future.  Marlan Palau MD 02/16/2013 2:44 PM  [facility] [address] [place], [place] [address]  Phone [phone] Fax [phone]

## 2013-02-17 ENCOUNTER — Telehealth: Payer: Self-pay | Admitting: Internal Medicine

## 2013-02-17 ENCOUNTER — Other Ambulatory Visit: Payer: Self-pay | Admitting: *Deleted

## 2013-02-17 DIAGNOSIS — R0902 Hypoxemia: Secondary | ICD-10-CM

## 2013-02-17 NOTE — Telephone Encounter (Signed)
I spoke with patient about results and she verbalized understanding and had no questions. Order sent 

## 2013-02-17 NOTE — Telephone Encounter (Signed)
Per MW- ONO on RA shows desat, so needs to start o2 at 2lpm and then recheck ONO on 2lpm [facility]

## 2013-02-17 NOTE — Telephone Encounter (Signed)
Pt returned Gabriela Rice's call.  Pt requests to be reached at [phone].  [name]

## 2013-02-18 ENCOUNTER — Encounter (HOSPITAL_COMMUNITY): Payer: Self-pay | Admitting: Pharmacy Technician

## 2013-02-22 ENCOUNTER — Telehealth: Payer: Self-pay | Admitting: Pulmonary Disease

## 2013-02-22 ENCOUNTER — Other Ambulatory Visit: Payer: Self-pay | Admitting: Pulmonary Disease

## 2013-02-22 DIAGNOSIS — J329 Chronic sinusitis, unspecified: Secondary | ICD-10-CM

## 2013-02-22 MED ORDER — SULFAMETHOXAZOLE-TRIMETHOPRIM 800-160 MG PO TABS: 1.0000 | ORAL_TABLET | Freq: Two times a day (BID) | ORAL | Status: DC

## 2013-02-22 NOTE — Telephone Encounter (Signed)
Pt called with complaint of intense sinus pressure and pain, teeth hurting, and frontal headache for a few days.  Blowing purulent mucus from nose and congested.  Fever to 100. No chest congestion or sob.  Will treat with:  1) neilmed sinus rinses am and pm for 7 days 2) afrin 2 each nostril am and pm for 4 days only. 3) will call in bactrim ds one bid for 7 days. (pcn allergy).

## 2013-02-23 ENCOUNTER — Other Ambulatory Visit: Payer: Self-pay | Admitting: Radiology

## 2013-02-23 ENCOUNTER — Telehealth: Payer: Self-pay | Admitting: Internal Medicine

## 2013-02-23 MED ORDER — LEVOFLOXACIN 750 MG PO TABS: 750.0000 mg | ORAL_TABLET | Freq: Every day | ORAL | Status: AC

## 2013-02-23 NOTE — Telephone Encounter (Signed)
Spoke with pt and she is aware. Orders for labs entered in epic.

## 2013-02-23 NOTE — Telephone Encounter (Signed)
Spoke with the pt and notified of recs per [facility] She verbalized understanding  Agrees to try the levaquin  ABX was sent to pharm  [facility] updated

## 2013-02-23 NOTE — Telephone Encounter (Signed)
I actually have not seen this patient in the office in 4 years. I did review the extender's note, for the first time today. She is planned for liver biopsy this week, which I agree with. In terms of the antibiotic, we have yet to define her liver disease. As such, she should discuss an alternative antibiotic with the prescribing physician as I do not know the nature or severity of her sinus infection. Also, I am concerned about the degree of her recent liver test abnormalities. Have her come into the office today or tomorrow morning at the latest, for repeat laboratories including CBC, comprehensive metabolic panel, PT/INR. Thank you.

## 2013-02-23 NOTE — Telephone Encounter (Signed)
Pt last seen by midlevel 02/12/13 for elevated LFT's. Pt has sinus infection and was given Sulfamethoxazole TMP. Pt states papers she got from the pharmacy state med should not be taken if they have liver problems. Pt wants to know if they need a different antibiotic. Dr. Marina Goodell please advise.

## 2013-02-23 NOTE — Telephone Encounter (Signed)
Spoke with the pt and notified needs to keep ov with MW with PFT- she is already scheduled for 03/17/13 Pt verbalized understanding and states will keep her appt

## 2013-02-23 NOTE — Telephone Encounter (Signed)
Bactrim is usually not an issue for pt's with liver issues unless the liver disease is significant.  Since I do not know the severity of her liver disease, will just change abx to levaquin 750mg  one each day for 7 days.  If she feels she cannot take that, she will need to discuss further with Dr. Sherene Sires.

## 2013-02-23 NOTE — Telephone Encounter (Signed)
F/u ov one week since needed call in over weekend and will need f/u

## 2013-02-23 NOTE — Telephone Encounter (Signed)
Patient states that she called in over the weekend due to sinus infection issues and spoke with [facility] and was prescribed bactrim. Pt states after she got home and read the paperwork it states to not take this medication if you are having liver problems. Pt states she is being currently worked up for liver problems and wants to know if it is ok to take this medication. Pt called her GI and they advised her to contact us because she has not been seen there in 4 years. Dr. Marina Rice advised the following: I actually have not seen this patient in the office in 4 years. I did review the extender's note, for the first time today. She is planned for liver biopsy this week, which I agree with. In terms of the antibiotic, we have yet to define her liver disease. As such, she should discuss an alternative antibiotic with the prescribing physician as I do not know the nature or severity of her sinus infection.  Since Dr. Sherene Rice is off and [facility] is the doctor that spoke with the pt I will send him the message. Please advise if it is ok for the pt to take the bactrim prescribed? Thanks. Gabriela Rice, CMA Allergies  Allergen Reactions  . Codeine     REACTION: hands swell  . Penicillins     REACTION: seizure as child

## 2013-02-24 ENCOUNTER — Other Ambulatory Visit (INDEPENDENT_AMBULATORY_CARE_PROVIDER_SITE_OTHER): Payer: BC Managed Care – PPO

## 2013-02-24 ENCOUNTER — Other Ambulatory Visit: Payer: Self-pay | Admitting: Internal Medicine

## 2013-02-24 DIAGNOSIS — R7989 Other specified abnormal findings of blood chemistry: Secondary | ICD-10-CM

## 2013-02-24 LAB — CBC WITH DIFFERENTIAL/PLATELET
HCT: 39.8 % (ref 36.0–46.0)
Hemoglobin: 13.9 g/dL (ref 12.0–15.0)
Platelets: 215 10*3/uL (ref 150.0–400.0)
RBC: 4.69 Mil/uL (ref 3.87–5.11)
WBC: 5.2 10*3/uL (ref 4.5–10.5)

## 2013-02-24 LAB — COMPREHENSIVE METABOLIC PANEL
ALT: 719 U/L — ABNORMAL HIGH (ref 0–35)
CO2: 24 mEq/L (ref 19–32)
Calcium: 8.9 mg/dL (ref 8.4–10.5)
Chloride: 101 mEq/L (ref 96–112)
Creatinine, Ser: 0.5 mg/dL (ref 0.4–1.2)
GFR: 132.2 mL/min (ref >60.00)

## 2013-02-24 MED ORDER — PREDNISONE 20 MG PO TABS: 40.0000 mg | ORAL_TABLET | Freq: Every day | ORAL | Status: DC

## 2013-02-25 ENCOUNTER — Ambulatory Visit (HOSPITAL_COMMUNITY): Admission: RE | Admit: 2013-02-25 | Payer: BC Managed Care – PPO | Source: Ambulatory Visit

## 2013-02-25 ENCOUNTER — Ambulatory Visit (HOSPITAL_COMMUNITY): Payer: BC Managed Care – PPO

## 2013-02-26 ENCOUNTER — Encounter: Payer: Self-pay | Admitting: Internal Medicine

## 2013-02-26 ENCOUNTER — Ambulatory Visit (INDEPENDENT_AMBULATORY_CARE_PROVIDER_SITE_OTHER): Payer: BC Managed Care – PPO | Admitting: Internal Medicine

## 2013-02-26 VITALS — BP 100/80 | HR 120 | Ht 64.0 in | Wt 190.5 lb

## 2013-02-26 DIAGNOSIS — R7989 Other specified abnormal findings of blood chemistry: Secondary | ICD-10-CM

## 2013-02-26 DIAGNOSIS — K754 Autoimmune hepatitis: Secondary | ICD-10-CM

## 2013-02-26 NOTE — Progress Notes (Signed)
HISTORY OF PRESENT ILLNESS:  Gabriela Rice is a 57 y.o. female with a history of multiple sclerosis, NASH, hyperlipidemia, morbid obesity, IBS, and anxiety disorder. I saw the patient in the office in June 2010 regarding chronic mild elevation of hepatic transaminases previously worked up. See that dictation. She was subsequently seen in November of 2011 for surveillance colonoscopy. She was found to have negative polyps and mild diverticulosis. Polyps or non-adenomatous and followup in 10 years recommended. Patient has been on a number of medications for her multiple sclerosis. Last summer she was placed on tecfidera. For treatment of her lipid disorder, number of statins. In April she was noted to have elevated liver tests above her baseline. She was referred to this office and evaluated by our physician assistant Feb 12 2013. See that dictation. Multiple studies were obtained to evaluate significant liver test abnormalities (transaminases in the 7 and 800 range with bilirubin 5.4). Everything was negative except for anti-smooth muscle antibody. Globulin were elevated. Palliative plans for liver biopsy today. I had her come into the office the other day for repeat laboratories. Marked worsening with SGOT 1462, SGPT 719, total bilirubin 16.1, alkaline phosphatase 249. Total protein was 8.6 with albumin 2.8. Prothrombin time was slightly elevated at 13.6. Her Tecfidera and statins have been stopped. I canceled her liver biopsy, initiated prednisone 40 mg daily, and asked her to come into the office today to be seen. She is with her son. Her chief complaint is jaundice, dark urine, and extreme fatigue. No issues with mentation. Her son corroborates this history.  REVIEW OF SYSTEMS:  All non-GI ROS negative except for fatigue, weakness,  Past Medical History  Diagnosis Date  . DM (diabetes mellitus)   . Hyperlipidemia   . Multiple sclerosis   . Allergic rhinitis   . Elevated LFTs   . Obesity   .  Vitamin D deficiency   . Abnormality of gait 02/16/2013  . Colon polyps   . Diverticulosis   . Hemorrhoids     Past Surgical History  Procedure Laterality Date  . Myoectomy    . Cesarean section      x 2    Social History EVELEAN BIGLER  reports that she quit smoking about 17 years ago. Her smoking use included Cigarettes. She has a 1 pack-year smoking history. She has never used smokeless tobacco. She reports that she does not drink alcohol or use illicit drugs.  family history includes Brain cancer in her maternal uncle; Colon cancer in her maternal aunt; Diabetes in her mother; Heart disease in her maternal grandmother; Multiple sclerosis in her mother; and Uterine cancer in her maternal aunt.  Allergies  Allergen Reactions  . Codeine     REACTION: hands swell  . Penicillins     REACTION: seizure as child       PHYSICAL EXAMINATION: Vital signs: BP 100/80  Pulse 120  Ht 5\' 4"  (1.626 m)  Wt 190 lb 8 oz (86.41 kg)  BMI 32.68 kg/m2  Constitutional: Deeply jaundiced fatigued-appearing, no acute distress Psychiatric: alert and oriented x3, cooperative Eyes: extraocular movements intact, scleral icterus, conjunctiva pink Mouth: oral pharynx moist, no lesions Neck: supple no lymphadenopathy Cardiovascular: heart regular rate and rhythm, no murmur Lungs: clear to auscultation bilaterally Abdomen: soft, obese, nontender, nondistended, no obvious ascites, no peritoneal signs, normal bowel sounds, no organomegaly Rectal: Omitted Extremities: no lower extremity edema bilaterally Skin: no lesions on visible extremities Neuro: No focal deficits. No asterixis.    ASSESSMENT:  #  1. Autoimmune hepatitis most likely.. Slight impairment in hepatic synthetic function with mild elevation of prothrombin time. No evidence for encephalopathy. May be primary or secondary (drug-induced from statin as has been reported). I doubt it is related to Tecfidera which may cause mild elevation  in AST (less than 3 times upper limit of normal) in about 4% of patients. Generally seen in the first 6 months. Elected not to proceed with liver biopsy, but rather treat empirically, based on the laboratory data, clinical history, and potential risk of bleeding associated with slight prolongation of prothrombin time #2. History of NASH #3. Multiple medical problems #4. Last colonoscopy November 2011 as outlined. Followup in 10 years   PLAN:  #1. Continue prednisone 40 mg daily. Discussed side effects. She is aware she is taken as before. #2. Repeat liver tests and prothrombin time in one week #3. Routine office visit with me in 4 weeks. Contact the office in the interim for any questions or problems

## 2013-02-26 NOTE — Patient Instructions (Addendum)
Dr. Marina Goodell would like you to have labs drawn every week starting 03-05-13.  Please follow up with Dr. Marina Goodell in 4 weeks

## 2013-03-02 ENCOUNTER — Encounter: Payer: Self-pay | Admitting: Internal Medicine

## 2013-03-04 ENCOUNTER — Other Ambulatory Visit (INDEPENDENT_AMBULATORY_CARE_PROVIDER_SITE_OTHER): Payer: BC Managed Care – PPO

## 2013-03-04 ENCOUNTER — Other Ambulatory Visit: Payer: Self-pay | Admitting: Internal Medicine

## 2013-03-04 DIAGNOSIS — K754 Autoimmune hepatitis: Secondary | ICD-10-CM

## 2013-03-04 LAB — HEPATIC FUNCTION PANEL
ALT: 680 U/L — ABNORMAL HIGH (ref 0–35)
AST: 786 U/L — ABNORMAL HIGH (ref 0–37)
Albumin: 2.7 g/dL — ABNORMAL LOW (ref 3.5–5.2)
Alkaline Phosphatase: 220 U/L — ABNORMAL HIGH (ref 39–117)
Bilirubin, Direct: 8.5 mg/dL — ABNORMAL HIGH (ref 0.0–0.3)
Total Protein: 7.9 g/dL (ref 6.0–8.3)

## 2013-03-11 ENCOUNTER — Other Ambulatory Visit (INDEPENDENT_AMBULATORY_CARE_PROVIDER_SITE_OTHER): Payer: BC Managed Care – PPO

## 2013-03-11 DIAGNOSIS — K754 Autoimmune hepatitis: Secondary | ICD-10-CM

## 2013-03-11 LAB — HEPATIC FUNCTION PANEL
ALT: 728 U/L — ABNORMAL HIGH (ref 0–35)
AST: 746 U/L — ABNORMAL HIGH (ref 0–37)
Total Bilirubin: 15.4 mg/dL — ABNORMAL HIGH (ref 0.3–1.2)
Total Protein: 7.3 g/dL (ref 6.0–8.3)

## 2013-03-11 LAB — PROTIME-INR
INR: 1.2 ratio — ABNORMAL HIGH (ref 0.8–1.0)
Prothrombin Time: 12.5 s — ABNORMAL HIGH (ref 10.2–12.4)

## 2013-03-15 ENCOUNTER — Other Ambulatory Visit (INDEPENDENT_AMBULATORY_CARE_PROVIDER_SITE_OTHER): Payer: BC Managed Care – PPO

## 2013-03-15 ENCOUNTER — Other Ambulatory Visit: Payer: Self-pay | Admitting: Internal Medicine

## 2013-03-15 DIAGNOSIS — Z8639 Personal history of other endocrine, nutritional and metabolic disease: Secondary | ICD-10-CM

## 2013-03-15 LAB — BASIC METABOLIC PANEL
BUN: 17 mg/dL (ref 6–23)
Calcium: 9.1 mg/dL (ref 8.4–10.5)
Chloride: 98 mEq/L (ref 96–112)
Creatinine, Ser: 0.6 mg/dL (ref 0.4–1.2)

## 2013-03-17 ENCOUNTER — Ambulatory Visit: Payer: BC Managed Care – PPO | Admitting: Internal Medicine

## 2013-03-24 ENCOUNTER — Other Ambulatory Visit (INDEPENDENT_AMBULATORY_CARE_PROVIDER_SITE_OTHER): Payer: BC Managed Care – PPO

## 2013-03-24 ENCOUNTER — Other Ambulatory Visit: Payer: Self-pay | Admitting: Internal Medicine

## 2013-03-24 DIAGNOSIS — K754 Autoimmune hepatitis: Secondary | ICD-10-CM

## 2013-03-24 LAB — PROTIME-INR
INR: 1.1 ratio — ABNORMAL HIGH (ref 0.8–1.0)
Prothrombin Time: 11.7 s (ref 10.2–12.4)

## 2013-03-24 LAB — HEPATIC FUNCTION PANEL
ALT: 697 U/L — ABNORMAL HIGH (ref 0–35)
Total Bilirubin: 14.1 mg/dL — ABNORMAL HIGH (ref 0.3–1.2)
Total Protein: 7.6 g/dL (ref 6.0–8.3)

## 2013-03-29 ENCOUNTER — Ambulatory Visit (INDEPENDENT_AMBULATORY_CARE_PROVIDER_SITE_OTHER): Payer: BC Managed Care – PPO | Admitting: Internal Medicine

## 2013-03-29 ENCOUNTER — Other Ambulatory Visit: Payer: Self-pay | Admitting: Radiology

## 2013-03-29 ENCOUNTER — Encounter (HOSPITAL_COMMUNITY): Payer: Self-pay | Admitting: Pharmacy Technician

## 2013-03-29 ENCOUNTER — Encounter: Payer: Self-pay | Admitting: Internal Medicine

## 2013-03-29 ENCOUNTER — Other Ambulatory Visit (INDEPENDENT_AMBULATORY_CARE_PROVIDER_SITE_OTHER): Payer: BC Managed Care – PPO

## 2013-03-29 VITALS — BP 100/70 | HR 80 | Ht 64.0 in | Wt 178.1 lb

## 2013-03-29 DIAGNOSIS — K754 Autoimmune hepatitis: Secondary | ICD-10-CM

## 2013-03-29 DIAGNOSIS — R7989 Other specified abnormal findings of blood chemistry: Secondary | ICD-10-CM

## 2013-03-29 LAB — HEPATIC FUNCTION PANEL
ALT: 630 U/L — ABNORMAL HIGH (ref 0–35)
AST: 424 U/L — ABNORMAL HIGH (ref 0–37)
Alkaline Phosphatase: 202 U/L — ABNORMAL HIGH (ref 39–117)
Bilirubin, Direct: 6.7 mg/dL — ABNORMAL HIGH (ref 0.0–0.3)
Total Protein: 7.5 g/dL (ref 6.0–8.3)

## 2013-03-29 LAB — CBC WITH DIFFERENTIAL/PLATELET
Hemoglobin: 13.5 g/dL (ref 12.0–15.0)
MCHC: 34.1 g/dL (ref 30.0–36.0)
Platelets: 198 10*3/uL (ref 150.0–400.0)
RDW: 20.3 % — ABNORMAL HIGH (ref 11.5–14.6)

## 2013-03-29 LAB — BASIC METABOLIC PANEL
CO2: 28 mEq/L (ref 19–32)
Chloride: 97 mEq/L (ref 96–112)
Glucose, Bld: 130 mg/dL — ABNORMAL HIGH (ref 70–99)
Potassium: 3.4 mEq/L — ABNORMAL LOW (ref 3.5–5.1)
Sodium: 132 mEq/L — ABNORMAL LOW (ref 135–145)

## 2013-03-29 LAB — PROTIME-INR
INR: 1.1 ratio — ABNORMAL HIGH (ref 0.8–1.0)
Prothrombin Time: 11.4 s (ref 10.2–12.4)

## 2013-03-29 NOTE — Progress Notes (Addendum)
HISTORY OF PRESENT ILLNESS:  Gabriela Rice is a 57 y.o. female with diabetes, hyperlipidemia, and multiple sclerosis who presents today for followup regarding newly diagnosed probable autoimmune hepatitis. She was evaluated in May for new insignificant elevation of liver tests. See that dictation. Workup was negative except for elevated globulins and an elevated anti-smooth muscle antibody. She was given a presumptive diagnosis of autoimmune hepatitis and placed on prednisone 40 mg daily. One week later, improvement in liver function tests, though still significantly abnormal. Subsequently, persistently abnormal liver test without ongoing improvement. Prothrombin time normal. 2 weeks ago, prednisone increased to 60 mg daily. She has had to have adjustment in diabetic medications secondary to prednisone-induced hyperglycemia. She presents today for schedule followup. Despite ongoing liver test abnormalities and jaundice, she tells me that she is feeling better. Less fatigue than last visit. She has had 15 pound weight loss over the past 6 weeks.  REVIEW OF SYSTEMS:  All non-GI ROS negative except for sinus and allergy trouble, anxiety, back pain, visual change related to MS, some fatigue, increased thirst and urination, and prednisone associated insomnia  Past Medical History  Diagnosis Date  . DM (diabetes mellitus)   . Hyperlipidemia   . Multiple sclerosis   . Allergic rhinitis   . Elevated LFTs   . Obesity   . Vitamin D deficiency   . Abnormality of gait 02/16/2013  . Colon polyps   . Diverticulosis   . Hemorrhoids     Past Surgical History  Procedure Laterality Date  . Myoectomy    . Cesarean section      x 2    Social History Gabriela Rice  reports that she quit smoking about 17 years ago. Her smoking use included Cigarettes. She has a 1 pack-year smoking history. She has never used smokeless tobacco. She reports that she does not drink alcohol or use illicit  drugs.  family history includes Brain cancer in her maternal uncle; Colon cancer in her maternal aunt; Diabetes in her mother; Heart disease in her maternal grandmother; Multiple sclerosis in her mother; and Uterine cancer in her maternal aunt.  Allergies  Allergen Reactions  . Codeine     REACTION: hands swell  . Penicillins     REACTION: seizure as child       PHYSICAL EXAMINATION: Vital signs: BP 100/70  Pulse 80  Ht 5\' 4"  (1.626 m)  Wt 178 lb 2 oz (80.797 kg)  BMI 30.56 kg/m2  Constitutional: Jaundice but otherwise generally well-appearing, no acute distress Psychiatric: alert and oriented x3, cooperative Eyes: extraocular movements intact, obvious icterus, conjunctiva pink Mouth: oral pharynx moist, no lesions Neck: supple no lymphadenopathy Cardiovascular: heart regular rate and rhythm, no murmur Lungs: clear to auscultation bilaterally Abdomen: soft, nontender, nondistended, no obvious ascites, no peritoneal signs, normal bowel sounds, no organomegaly Extremities: no lower extremity edema bilaterally Skin: no lesions on visible extremities Neuro: No focal deficits. No asterixis.    ASSESSMENT:  #1. Probable autoimmune hepatitis. No significant improvement in liver tests 1 month after initiating prednisone. Intact hepatic synthetic function at this point. Discussed with her that approximately 10% of patients with autoimmune hepatitis are nonresponders to therapy. This could potentially lead to cirrhosis and the need for liver transplantation #2. Brother with hemochromatosis. Patient with C282Y and H63D the patient. #3. Previous evaluation of elevated hepatic transaminases by Dr. Sharrell Ku including liver biopsy August 2004 revealing changes of fatty liver without significant inflammation or fibrosis #4. Father with cirrhosis. Etiology unclear,  though significant alcohol history #5. Multiple medical problems including diabetes and MS #6. History of non-adenomatous  colon polyps. Last colonoscopy November 2011  PLAN:  #1. Laboratories today including liver function tests, PT/INR, CBC, TPMT phenotype, hepatitis A and B. status #2. Ultrasound guided core biopsy of the liver to confirm autoimmune hepatitis and the patient not responding to steroids and rule out other possible etiology #3. Continue current dose of prednisone for now. May need to add Imuran or like immunomodulator #4. Followup and treatment adjustments to be determined after the above.

## 2013-03-29 NOTE — Patient Instructions (Addendum)
Your physician has requested that you go to the basement for lab work before leaving today   Someone from the radiology department will contact you to schedule the liver biopsy.

## 2013-03-30 LAB — HEPATITIS A ANTIBODY, TOTAL: Hep A Total Ab: POSITIVE — AB

## 2013-03-31 ENCOUNTER — Encounter (HOSPITAL_COMMUNITY): Payer: Self-pay

## 2013-03-31 ENCOUNTER — Other Ambulatory Visit: Payer: Self-pay | Admitting: Internal Medicine

## 2013-03-31 ENCOUNTER — Ambulatory Visit (HOSPITAL_COMMUNITY)
Admission: RE | Admit: 2013-03-31 | Discharge: 2013-03-31 | Disposition: A | Discharge status: Home / Self Care | Payer: BC Managed Care – PPO | Source: Ambulatory Visit | Attending: Internal Medicine | Admitting: Internal Medicine

## 2013-03-31 DIAGNOSIS — Z860100 Personal history of colon polyps, unspecified: Secondary | ICD-10-CM | POA: Insufficient documentation

## 2013-03-31 DIAGNOSIS — K649 Unspecified hemorrhoids: Secondary | ICD-10-CM | POA: Insufficient documentation

## 2013-03-31 DIAGNOSIS — J309 Allergic rhinitis, unspecified: Secondary | ICD-10-CM | POA: Insufficient documentation

## 2013-03-31 DIAGNOSIS — K5730 Diverticulosis of large intestine without perforation or abscess without bleeding: Secondary | ICD-10-CM | POA: Insufficient documentation

## 2013-03-31 DIAGNOSIS — E785 Hyperlipidemia, unspecified: Secondary | ICD-10-CM | POA: Insufficient documentation

## 2013-03-31 DIAGNOSIS — G35 Multiple sclerosis: Secondary | ICD-10-CM | POA: Insufficient documentation

## 2013-03-31 DIAGNOSIS — E669 Obesity, unspecified: Secondary | ICD-10-CM | POA: Insufficient documentation

## 2013-03-31 DIAGNOSIS — K754 Autoimmune hepatitis: Secondary | ICD-10-CM

## 2013-03-31 DIAGNOSIS — E119 Type 2 diabetes mellitus without complications: Secondary | ICD-10-CM | POA: Insufficient documentation

## 2013-03-31 DIAGNOSIS — K838 Other specified diseases of biliary tract: Secondary | ICD-10-CM | POA: Insufficient documentation

## 2013-03-31 LAB — CBC
MCH: 30.7 pg (ref 26.0–34.0)
MCHC: 35.7 g/dL (ref 30.0–36.0)
MCV: 85.9 fL (ref 78.0–100.0)
Platelets: 159 10*3/uL (ref 150–400)
RDW: 19.1 % — ABNORMAL HIGH (ref 11.5–15.5)

## 2013-03-31 MED ORDER — MIDAZOLAM HCL 2 MG/2ML IJ SOLN
INTRAMUSCULAR | Status: AC
  Filled 2013-03-31: qty 6

## 2013-03-31 MED ORDER — FENTANYL CITRATE 0.05 MG/ML IJ SOLN
INTRAMUSCULAR | Status: AC | PRN
  Administered 2013-03-31 (×3): 50 ug via INTRAVENOUS

## 2013-03-31 MED ORDER — FENTANYL CITRATE 0.05 MG/ML IJ SOLN
INTRAMUSCULAR | Status: AC
  Filled 2013-03-31: qty 2

## 2013-03-31 MED ORDER — SODIUM CHLORIDE 0.9 % IV SOLN: INTRAVENOUS | Status: DC

## 2013-03-31 MED ORDER — MIDAZOLAM HCL 2 MG/2ML IJ SOLN
INTRAMUSCULAR | Status: AC | PRN
  Administered 2013-03-31: 1 mg via INTRAVENOUS
  Administered 2013-03-31: 2 mg via INTRAVENOUS
  Administered 2013-03-31: 1 mg via INTRAVENOUS

## 2013-03-31 NOTE — Procedures (Signed)
US guided core biopsies of right hepatic lobe.  No immediate complication.

## 2013-03-31 NOTE — ED Notes (Signed)
Patient denies pain and is resting comfortably.  

## 2013-03-31 NOTE — H&P (Signed)
Gabriela Rice is an 57 y.o. female.   Chief Complaint: fatigue; weakness since May 2014 Dark urine since May 2014 MD visit ; US liver and labs revealed elevated liver functions and hepatitis Scheduled now for liver core biopsy HPI: DM; HLD; MS; elev LFTs  Past Medical History  Diagnosis Date  . DM (diabetes mellitus)   . Hyperlipidemia   . Multiple sclerosis   . Allergic rhinitis   . Elevated LFTs   . Obesity   . Vitamin D deficiency   . Abnormality of gait 02/16/2013  . Colon polyps   . Diverticulosis   . Hemorrhoids     Past Surgical History  Procedure Laterality Date  . Myoectomy    . Cesarean section      x 2    Family History  Problem Relation Age of Onset  . Heart disease Maternal Grandmother   . Colon cancer Maternal Aunt   . Uterine cancer Maternal Aunt   . Brain cancer Maternal Uncle   . Diabetes Mother   . Multiple sclerosis Mother    Social History:  reports that she quit smoking about 17 years ago. Her smoking use included Cigarettes. She has a 1 pack-year smoking history. She has never used smokeless tobacco. She reports that she does not drink alcohol or use illicit drugs.  Allergies:  Allergies  Allergen Reactions  . Codeine     REACTION: hands swell  . Penicillins     REACTION: seizure as child     (Not in a hospital admission)  Results for orders placed during the hospital encounter of 03/31/13 (from the past 48 hour(s))  GLUCOSE, CAPILLARY     Status: Abnormal   Collection Time    03/31/13  8:14 AM      Result Value Range   Glucose-Capillary 109 (*) 70 - 99 mg/dL  APTT     Status: None   Collection Time    03/31/13  8:35 AM      Result Value Range   aPTT 26  24 - 37 seconds  CBC     Status: Abnormal   Collection Time    03/31/13  8:35 AM      Result Value Range   WBC 5.5  4.0 - 10.5 K/uL   RBC 3.98  3.87 - 5.11 MIL/uL   Hemoglobin 12.2  12.0 - 15.0 g/dL   HCT 40.9 (*) 81.1 - 91.4 %   MCV 85.9  78.0 - 100.0 fL   MCH 30.7   26.0 - 34.0 pg   MCHC 35.7  30.0 - 36.0 g/dL   RDW 78.2 (*) 95.6 - 21.3 %   Platelets 159  150 - 400 K/uL  PROTIME-INR     Status: None   Collection Time    03/31/13  8:35 AM      Result Value Range   Prothrombin Time 12.9  11.6 - 15.2 seconds   INR 0.99  0.00 - 1.49   No results found.  Review of Systems  Constitutional: Positive for malaise/fatigue. Negative for fever.  Respiratory: Negative for shortness of breath.   Cardiovascular: Negative for chest pain.  Gastrointestinal: Positive for nausea and abdominal pain.  Neurological: Positive for weakness and headaches. Negative for dizziness.    Blood pressure 121/72, pulse 61, temperature 96.9 F (36.1 C), temperature source Oral, resp. rate 18, height 5\' 4"  (1.626 m), weight 178 lb (80.74 kg), SpO2 100.00%. Physical Exam  Constitutional: She is oriented to person, place, and time.  She appears well-developed and well-nourished.  Eyes:  jaundice  Cardiovascular: Normal rate and regular rhythm.   No murmur heard. Respiratory: Effort normal and breath sounds normal. She has no wheezes.  GI: Soft. Bowel sounds are normal. There is no tenderness.  Musculoskeletal: Normal range of motion.  Neurological: She is alert and oriented to person, place, and time.  Skin: Skin is dry.  Psychiatric: She has a normal mood and affect. Her behavior is normal. Judgment and thought content normal.     Assessment/Plan Dark urine; GI sxs; fatigue Since May 2014 Jaundice; elev LFTs recent US reveals hepatitis Scheduled for liver core biopsy Pt aware of procedure benefits and risks and agreeable to proceed Consent signed and in chart  Oran Dillenburg A 03/31/2013, 9:12 AM

## 2013-04-05 ENCOUNTER — Encounter: Payer: Self-pay | Admitting: Internal Medicine

## 2013-04-05 ENCOUNTER — Ambulatory Visit (INDEPENDENT_AMBULATORY_CARE_PROVIDER_SITE_OTHER): Payer: BC Managed Care – PPO | Admitting: Internal Medicine

## 2013-04-05 VITALS — BP 112/80 | HR 115 | Temp 98.0°F | Ht 63.5 in | Wt 179.0 lb

## 2013-04-05 DIAGNOSIS — R0989 Other specified symptoms and signs involving the circulatory and respiratory systems: Secondary | ICD-10-CM

## 2013-04-05 DIAGNOSIS — R0609 Other forms of dyspnea: Secondary | ICD-10-CM

## 2013-04-05 DIAGNOSIS — R0600 Dyspnea, unspecified: Secondary | ICD-10-CM

## 2013-04-05 DIAGNOSIS — J310 Chronic rhinitis: Secondary | ICD-10-CM

## 2013-04-05 DIAGNOSIS — R0902 Hypoxemia: Secondary | ICD-10-CM

## 2013-04-05 LAB — PULMONARY FUNCTION TEST

## 2013-04-05 NOTE — Progress Notes (Signed)
PFT done today. 

## 2013-04-05 NOTE — Progress Notes (Signed)
  Subjective:    Patient ID: Gabriela Rice, female    DOB: 07-01-1956  MRN: [id number]  HPI  7 yowf quit  Mild smoking 1997 no resp problems with MS and chronic fatigue and doe starting around 2013 referred 02/03/13 to pulmonary clinic by Dr Gabriela Rice with nl PFT's 04/05/2013   02/04/2013 1st pulmonary eval cc indolent onset progressive doe x 1 year to point where has to lean on cart when walks at grocery store assoc with new cough x sev months which  typically happens this time every year but this year more of a dry  daytime than a first thiing  in am pattern prod cough.   MS worse x 3 years, f/u Gabriela Rice. No dysphagia.  rec ono 02 > start 02 2lpm   04/05/2013 f/u ov/Gabriela Rice  Chief Complaint  Patient presents with  . Followup with PFT    Pt states her cough has resolved. Her breathing has also improved. She states that since stating her noct o2 she has had increased nasal d/c with green mucus.    denies limiting sob, cough is gone  - nasal symptoms started 02 rx, bloody to green mucus   No obvious daytime variabilty or assoc chronic cough or cp or chest tightness, subjective wheeze overt sinus or hb symptoms. No unusual exp hx or h/o childhood pna/ asthma or premature birth to her knowledge.   Current Medications, Allergies, Past Medical History, Past Surgical History, Family History, and Social History were reviewed in [employer] record.  ROS  The following are not active complaints unless bolded sore throat, dysphagia, dental problems, itching, sneezing,  nasal congestion or excess/ purulent secretions, ear ache,   fever, chills, sweats, unintended wt loss, pleuritic or exertional cp, hemoptysis,  orthopnea pnd or leg swelling, presyncope, palpitations, heartburn, abdominal pain, anorexia, nausea, vomiting, diarrhea  or change in bowel or urinary habits, change in stools or urine, dysuria,hematuria,  rash, arthralgias, visual complaints, headache, numbness weakness  or ataxia or problems with walking or coordination due to MS, no change from baseline,   change in mood/affect or memory.             Objective:   Physical Exam  amb wf nad  04/05/2013          179  Wt Readings from Last 3 Encounters:  02/04/13 197 lb 9.6 oz (89.631 kg)  03/29/09 203 lb 6.4 oz (92.262 kg)    HEENT: nl dentition, turbinates, and orophanx. Nl external ear canals without cough reflex   NECK :  without JVD/Nodes/TM/ nl carotid upstrokes bilaterally   LUNGS: no acc muscle use, clear to A and P bilaterally without cough on insp or exp maneuvers   CV:  RRR  no s3 or murmur or increase in P2, no edema   ABD:  soft and nontender with nl excursion in the supine position. No bruits or organomegaly, bowel sounds nl  MS:  warm without deformities, calf tenderness, cyanosis or clubbing  SKIN: warm and dry without lesions    NEURO:  alert, approp, no deficits    CXR  02/04/2013 : Prominent bronchitic changes with bibasilar atelectasis           Assessment & Plan:

## 2013-04-05 NOTE — Patient Instructions (Addendum)
Nasal saline spray > if get worse call [phone] ask Gabriela Rice to Schedule sinus CT   Try off 02 for now    Please schedule a follow up visit in 3 months but call sooner if needed

## 2013-04-07 ENCOUNTER — Telehealth: Payer: Self-pay

## 2013-04-07 ENCOUNTER — Other Ambulatory Visit (INDEPENDENT_AMBULATORY_CARE_PROVIDER_SITE_OTHER): Payer: BC Managed Care – PPO

## 2013-04-07 DIAGNOSIS — R7989 Other specified abnormal findings of blood chemistry: Secondary | ICD-10-CM

## 2013-04-07 LAB — HEPATIC FUNCTION PANEL
Albumin: 3 g/dL — ABNORMAL LOW (ref 3.5–5.2)
Alkaline Phosphatase: 181 U/L — ABNORMAL HIGH (ref 39–117)
Bilirubin, Direct: 4 mg/dL — ABNORMAL HIGH (ref 0.0–0.3)
Total Bilirubin: 7.5 mg/dL — ABNORMAL HIGH (ref 0.3–1.2)

## 2013-04-07 NOTE — Telephone Encounter (Signed)
Spoke with pt and she is aware and will come for labs. 

## 2013-04-07 NOTE — Telephone Encounter (Signed)
Message copied by Chrystie Nose on Wed Apr 07, 2013 10:15 AM ------      Message from: Hilarie Fredrickson      Created: Wed Apr 07, 2013 10:03 AM      Regarding: Liver biopsy results       [name], please let the patient know that I am waiting to hear from the pathologist regarding her liver biopsy results. I just wanted to let her that we have not forgotten. In the interim, she should continue on her current medication. Have her repeat liver tests this week, if she has not yet. Also PT/INR. Thanks ------

## 2013-04-08 ENCOUNTER — Telehealth: Payer: Self-pay

## 2013-04-08 ENCOUNTER — Other Ambulatory Visit: Payer: Self-pay

## 2013-04-08 DIAGNOSIS — K759 Inflammatory liver disease, unspecified: Secondary | ICD-10-CM

## 2013-04-08 DIAGNOSIS — J310 Chronic rhinitis: Secondary | ICD-10-CM | POA: Insufficient documentation

## 2013-04-08 MED ORDER — AZATHIOPRINE 50 MG PO TABS: 50.0000 mg | ORAL_TABLET | Freq: Every day | ORAL | Status: DC

## 2013-04-08 NOTE — Telephone Encounter (Signed)
Message copied by Annett Fabian on Thu Apr 08, 2013  4:49 PM ------      Message from: Hilarie Fredrickson      Created: Thu Apr 08, 2013  4:36 PM       That result must have arrived today (was pending yesterday). Thank you. It is normal. Start her on Imuran 50 mg daily. Convert this to phone note after you speak with patient. Thanks . D.r Marina Goodell            ----- Message -----         From: Rossie Muskrat, RN         Sent: 04/08/2013   4:19 PM           To: Hilarie Fredrickson, MD            Dr. Marina Goodell the results for TPMT from 6/30./14 are in her chart.  Were you expecting additional results?       ------

## 2013-04-08 NOTE — Assessment & Plan Note (Addendum)
-   See sleep study 01/13/13  Neg for sign osa but did desat RA  - ONO RA 01/28/13 >  Sat <89% for 54 min 48 sec > 02/15/2013 rec 02 2lpm and recheck ONO on 2lpm  On 02/25/13 > no desats on 2lpm  Adequate control on present rx but tolerating nasal 02 poorly due to rhinitis > try NS and off 02  For now

## 2013-04-08 NOTE — Assessment & Plan Note (Signed)
Add nasal saline irrigation, f/u sinus ct next step

## 2013-04-08 NOTE — Assessment & Plan Note (Signed)
-   02/04/2013  Walked RA x 2 laps @ 185 ft each stopped due to  Fatigue not sob sat 92% - PFTs 04/05/2013 wnl x DLCO 75 corrects to 193   Resolved to her satisfaction, main limitations are due to MS which does not typically effect diaphragm or cough muscles until very late stage so no pulmonary f/u needed

## 2013-04-08 NOTE — Telephone Encounter (Signed)
Advised patient of lab results and that needs to start Imuran and labs next week.  She is aware that this is an additional medication not to replace any other medications.

## 2013-04-09 ENCOUNTER — Telehealth: Payer: Self-pay | Admitting: Gastroenterology

## 2013-04-09 MED ORDER — PREDNISONE 20 MG PO TABS: 60.0000 mg | ORAL_TABLET | Freq: Every day | ORAL | Status: DC

## 2013-04-09 NOTE — Telephone Encounter (Signed)
Refilled Prednisone to reflect change in dosage from 40mg  qd to 60mg  qd

## 2013-04-15 ENCOUNTER — Other Ambulatory Visit (INDEPENDENT_AMBULATORY_CARE_PROVIDER_SITE_OTHER): Payer: BC Managed Care – PPO

## 2013-04-15 DIAGNOSIS — K759 Inflammatory liver disease, unspecified: Secondary | ICD-10-CM

## 2013-04-15 LAB — HEPATIC FUNCTION PANEL
ALT: 278 U/L — ABNORMAL HIGH (ref 0–35)
Bilirubin, Direct: 2.3 mg/dL — ABNORMAL HIGH (ref 0.0–0.3)
Total Bilirubin: 4.3 mg/dL — ABNORMAL HIGH (ref 0.3–1.2)

## 2013-04-15 LAB — PROTIME-INR: INR: 1 ratio (ref 0.8–1.0)

## 2013-04-16 ENCOUNTER — Telehealth: Payer: Self-pay | Admitting: Internal Medicine

## 2013-04-16 NOTE — Telephone Encounter (Signed)
Spoke with patient and she is asking for her lab results and results of tests from [facility]. She is asking how long she will be on Prednisone and Imuran. Her PCP has been asking her. She is aware that Dr. Marina Goodell is out of the office until Monday. Please, advise.

## 2013-04-19 ENCOUNTER — Telehealth: Payer: Self-pay | Admitting: Internal Medicine

## 2013-04-19 DIAGNOSIS — R0902 Hypoxemia: Secondary | ICD-10-CM

## 2013-04-19 NOTE — Telephone Encounter (Signed)
Advised on laboratory results. Please see that correspondence

## 2013-04-19 NOTE — Telephone Encounter (Signed)
See prev entry 

## 2013-04-19 NOTE — Telephone Encounter (Signed)
Spoke with patient- aware that [facility] ok'd for d/c O2.-order placed but patient then asked for [place] to find out from [facility] what she can do or take for nasal drainage worsened and has green and blood tinged discharge. Pt has tried the nasal saline and no help. Pt would like to avoid having CT scan as she as incurred a lot of medical charges lately. Pt aware that we will call her back tomorrow with answer from [facility]. Thanks.

## 2013-04-19 NOTE — Telephone Encounter (Signed)
Pt aware. See result note. 

## 2013-04-19 NOTE — Telephone Encounter (Signed)
Spoke with patient, patient states at her last visit she was told she did not need to wear her oxygen She wants to know if an order can be placed do that her DME company can pick up their o2 She says she has tried w out o2 and feels she has been doing well Dr. Sherene Sires please advise if this order can be placed.  Last OV 04/05/13  Patient Instructions    Nasal saline spray > if get worse call [phone] ask Almyra Free to Schedule sinus CT  Try off 02 for now  Please schedule a follow up visit in 3 months but call sooner if needed

## 2013-04-19 NOTE — Telephone Encounter (Signed)
Ok to d/c 02  

## 2013-04-19 NOTE — Addendum Note (Signed)
Addended by: Ronny Bacon on: 04/19/2013 05:41 PM   Modules accepted: Orders

## 2013-04-20 ENCOUNTER — Telehealth: Payer: Self-pay | Admitting: Internal Medicine

## 2013-04-20 MED ORDER — AZITHROMYCIN 250 MG PO TABS: ORAL_TABLET | ORAL | Status: DC

## 2013-04-20 NOTE — Telephone Encounter (Signed)
Pt aware.

## 2013-04-20 NOTE — Addendum Note (Signed)
Addended by: Darrell Jewel on: 04/20/2013 09:59 AM   Modules accepted: Orders

## 2013-04-20 NOTE — Telephone Encounter (Signed)
Pt states she has a sinus infection and Dr. Sherene Sires prescribed a Z pack for her to take. Pt wants to know if it is ok for her to take the Z pack with her liver issues. Pt was concerned when she read the package insert. Dr. Marina Goodell please advise.

## 2013-04-20 NOTE — Telephone Encounter (Signed)
Pt advised and rx sent. Jennifer Castillo, CMA  

## 2013-04-20 NOTE — Telephone Encounter (Signed)
Z-pak is ok. Thank her for checking

## 2013-04-20 NOTE — Telephone Encounter (Signed)
zpak x one then ov if not better

## 2013-04-22 ENCOUNTER — Other Ambulatory Visit (INDEPENDENT_AMBULATORY_CARE_PROVIDER_SITE_OTHER): Payer: BC Managed Care – PPO

## 2013-04-22 DIAGNOSIS — K754 Autoimmune hepatitis: Secondary | ICD-10-CM

## 2013-04-22 DIAGNOSIS — R7989 Other specified abnormal findings of blood chemistry: Secondary | ICD-10-CM

## 2013-04-22 LAB — HEPATIC FUNCTION PANEL
AST: 129 U/L — ABNORMAL HIGH (ref 0–37)
Albumin: 3.3 g/dL — ABNORMAL LOW (ref 3.5–5.2)
Alkaline Phosphatase: 125 U/L — ABNORMAL HIGH (ref 39–117)

## 2013-04-22 LAB — PROTIME-INR: INR: 1 ratio (ref 0.8–1.0)

## 2013-04-26 ENCOUNTER — Telehealth: Payer: Self-pay | Admitting: Internal Medicine

## 2013-04-26 MED ORDER — AZATHIOPRINE 50 MG PO TABS: 100.0000 mg | ORAL_TABLET | Freq: Every day | ORAL | Status: DC

## 2013-04-26 NOTE — Telephone Encounter (Signed)
Spoke with pt and she is aware of results. See result note. Refill sent in for pt for Imuran. Pt wants to know if it is safe for her to go and visit her mother in the nursing home since her immune system can be affected by the Imuran? Please advise.

## 2013-04-26 NOTE — Telephone Encounter (Signed)
Pt aware.

## 2013-04-26 NOTE — Telephone Encounter (Signed)
Ok to visit. Wash hands before and after visit

## 2013-04-29 ENCOUNTER — Other Ambulatory Visit (INDEPENDENT_AMBULATORY_CARE_PROVIDER_SITE_OTHER): Payer: BC Managed Care – PPO

## 2013-04-29 DIAGNOSIS — K754 Autoimmune hepatitis: Secondary | ICD-10-CM

## 2013-04-29 LAB — PROTIME-INR
INR: 1 ratio (ref 0.8–1.0)
Prothrombin Time: 10.4 s (ref 10.2–12.4)

## 2013-04-29 LAB — HEPATIC FUNCTION PANEL
ALT: 158 U/L — ABNORMAL HIGH (ref 0–35)
AST: 108 U/L — ABNORMAL HIGH (ref 0–37)
Bilirubin, Direct: 1.4 mg/dL — ABNORMAL HIGH (ref 0.0–0.3)
Total Bilirubin: 3.2 mg/dL — ABNORMAL HIGH (ref 0.3–1.2)

## 2013-04-30 ENCOUNTER — Telehealth: Payer: Self-pay

## 2013-04-30 ENCOUNTER — Other Ambulatory Visit: Payer: Self-pay | Admitting: Internal Medicine

## 2013-04-30 MED ORDER — PREDNISONE 5 MG PO TABS: ORAL_TABLET | ORAL | Status: DC

## 2013-04-30 NOTE — Telephone Encounter (Signed)
Message copied by Chrystie Nose on Fri Apr 30, 2013  8:36 AM ------      Message from: Hilarie Fredrickson      Created: Thu Apr 29, 2013  2:57 PM      Regarding: RE: Path       Let patient know that [facility] agrees with autoimmune hepatitis. Continue with current treatment plan.      ----- Message -----         From: Lily Lovings, RN         Sent: 04/29/2013  11:12 AM           To: Hilarie Fredrickson, MD      Subject: Path                                                     Dr. Marina Goodell,            Have you had a chance to review her path from [facility]? Before I call her with her lab results I wanted to check with you on the path, she was anxious to hear back from the path.            Thanks,      [name]       ------

## 2013-04-30 NOTE — Telephone Encounter (Signed)
Spoke with pt and she is aware.

## 2013-05-03 ENCOUNTER — Telehealth: Payer: Self-pay | Admitting: Internal Medicine

## 2013-05-03 DIAGNOSIS — K754 Autoimmune hepatitis: Secondary | ICD-10-CM

## 2013-05-03 NOTE — Telephone Encounter (Signed)
Spoke with pt and she is aware.

## 2013-05-03 NOTE — Telephone Encounter (Signed)
Pt states she is "freaking out" because of the way she looks. Pt states her face is swollen. Reports her heart has been racing and she feels like she is going to jump out of her skin. Pt states she is not sleeping. Pt takes Xanax 0.5mg  at bedtime and usually that helps her sleep well through the night. Now pt states she is up every morning at 4am and she is not sleeping much at all. Pts dose was just changed to Prednisone 35mg  the end of last week. Pt wants to know if there is anything she can do for these side effects. Please advise.

## 2013-05-03 NOTE — Telephone Encounter (Signed)
Drop her prednisone down to 25 mg. We will continue to monitor her liver tests weekly. Also, add basic metabolic panel and CBC to her next set of LFTs

## 2013-05-06 ENCOUNTER — Other Ambulatory Visit: Payer: Self-pay | Admitting: Internal Medicine

## 2013-05-06 ENCOUNTER — Other Ambulatory Visit (INDEPENDENT_AMBULATORY_CARE_PROVIDER_SITE_OTHER): Payer: BC Managed Care – PPO

## 2013-05-06 DIAGNOSIS — K754 Autoimmune hepatitis: Secondary | ICD-10-CM

## 2013-05-06 LAB — BASIC METABOLIC PANEL
BUN: 21 mg/dL (ref 6–23)
CO2: 28 mEq/L (ref 19–32)
Chloride: 104 mEq/L (ref 96–112)
Glucose, Bld: 226 mg/dL — ABNORMAL HIGH (ref 70–99)
Potassium: 3.5 mEq/L (ref 3.5–5.1)
Sodium: 138 mEq/L (ref 135–145)

## 2013-05-06 LAB — CBC WITH DIFFERENTIAL/PLATELET
Basophils Relative: 0.2 % (ref 0.0–3.0)
Eosinophils Relative: 0.8 % (ref 0.0–5.0)
Monocytes Relative: 6 % (ref 3.0–12.0)
Neutrophils Relative %: 78.8 % — ABNORMAL HIGH (ref 43.0–77.0)
Platelets: 189 10*3/uL (ref 150.0–400.0)
RBC: 4.09 Mil/uL (ref 3.87–5.11)
WBC: 5.2 10*3/uL (ref 4.5–10.5)

## 2013-05-06 LAB — HEPATIC FUNCTION PANEL
ALT: 115 U/L — ABNORMAL HIGH (ref 0–35)
Total Bilirubin: 2.5 mg/dL — ABNORMAL HIGH (ref 0.3–1.2)

## 2013-05-06 LAB — PROTIME-INR: INR: 1.1 ratio — ABNORMAL HIGH (ref 0.8–1.0)

## 2013-05-19 ENCOUNTER — Other Ambulatory Visit (INDEPENDENT_AMBULATORY_CARE_PROVIDER_SITE_OTHER): Payer: BC Managed Care – PPO

## 2013-05-19 DIAGNOSIS — R7989 Other specified abnormal findings of blood chemistry: Secondary | ICD-10-CM

## 2013-05-19 DIAGNOSIS — K754 Autoimmune hepatitis: Secondary | ICD-10-CM

## 2013-05-19 LAB — PROTIME-INR: INR: 1.1 ratio — ABNORMAL HIGH (ref 0.8–1.0)

## 2013-05-19 LAB — HEPATIC FUNCTION PANEL
ALT: 83 U/L — ABNORMAL HIGH (ref 0–35)
Bilirubin, Direct: 0.7 mg/dL — ABNORMAL HIGH (ref 0.0–0.3)
Total Bilirubin: 2.2 mg/dL — ABNORMAL HIGH (ref 0.3–1.2)

## 2013-05-28 ENCOUNTER — Encounter: Payer: Self-pay | Admitting: Internal Medicine

## 2013-06-03 ENCOUNTER — Other Ambulatory Visit (INDEPENDENT_AMBULATORY_CARE_PROVIDER_SITE_OTHER): Payer: BC Managed Care – PPO

## 2013-06-03 DIAGNOSIS — K754 Autoimmune hepatitis: Secondary | ICD-10-CM

## 2013-06-03 LAB — HEPATIC FUNCTION PANEL
AST: 74 U/L — ABNORMAL HIGH (ref 0–37)
Albumin: 4 g/dL (ref 3.5–5.2)
Alkaline Phosphatase: 69 U/L (ref 39–117)

## 2013-06-04 ENCOUNTER — Encounter: Payer: Self-pay | Admitting: Internal Medicine

## 2013-06-04 ENCOUNTER — Ambulatory Visit (INDEPENDENT_AMBULATORY_CARE_PROVIDER_SITE_OTHER): Payer: BC Managed Care – PPO | Admitting: Internal Medicine

## 2013-06-04 VITALS — BP 122/84 | HR 100 | Ht 63.5 in | Wt 178.0 lb

## 2013-06-04 DIAGNOSIS — K5900 Constipation, unspecified: Secondary | ICD-10-CM

## 2013-06-04 DIAGNOSIS — K754 Autoimmune hepatitis: Secondary | ICD-10-CM

## 2013-06-04 DIAGNOSIS — R110 Nausea: Secondary | ICD-10-CM

## 2013-06-04 MED ORDER — POLYETHYLENE GLYCOL 3350 17 GM/SCOOP PO POWD: 17.0000 g | Freq: Every day | ORAL | Status: DC

## 2013-06-04 MED ORDER — AZATHIOPRINE 50 MG PO TABS: 150.0000 mg | ORAL_TABLET | Freq: Every day | ORAL | Status: DC

## 2013-06-04 NOTE — Patient Instructions (Addendum)
Per Dr. Marina Goodell, increase your Imuran to 150mg  (3 tablets a day)  Decrease your Prednisone to 10mg  daily  You may take Miralax as needed for your constipation  Please follow up with Dr. Marina Goodell in 3 months

## 2013-06-04 NOTE — Progress Notes (Signed)
HISTORY OF PRESENT ILLNESS:  Gabriela Rice is a 57 y.o. female with diabetes, hyperlipidemia, and multiple sclerosis who presents today for followup regarding management of autoimmune hepatitis. She was evaluated in may have 2014 for new significant elevation of liver tests. See that dictation. Workup was negative except for elevated globulins and elevated anti-smooth muscle antibody. She was given the presumptive diagnosis of autoimmune hepatitis and placed on prednisone 40 mg daily. Because of a slow response to therapy and significant issues with prednisone, it was elected to perform a liver biopsy. This revealed changes consistent with autoimmune hepatitis. The slides were referred to [facility] for evaluation. Similar reading. TPM T. genotype was normal. Hepatitis A and B. status negative. In mid July she was started on Imuran. We have been tapering prednisone and monitoring liver tests. Overall she has made marked improvement. She now has transaminase is less than 100 and normal bilirubin. Overall she is feeling better. Diabetes better control. Weight has been stable. Cushingoid features resolved. Currently she is on Imuran 100 mg daily and prednisone 15 mg daily. Other complaints include nausea which began immediately after starting the antihypertensive lisinopril. Also chronic constipation for which MiraLax has worked previously. No other complaints. Her multiple sclerosis has been stable.  REVIEW OF SYSTEMS:  All non-GI ROS negative except for allergy, anxiety, back pain, fatigue, irregular heart rate with skipped beats, and sleeping problems  Past Medical History  Diagnosis Date  . DM (diabetes mellitus)   . Hyperlipidemia   . Multiple sclerosis   . Allergic rhinitis   . Elevated LFTs   . Obesity   . Vitamin D deficiency   . Abnormality of gait 02/16/2013  . Colon polyps   . Diverticulosis   . Hemorrhoids   . Hepatitis     with cholestasis  . Hemorrhoids     Past Surgical History   Procedure Laterality Date  . Myoectomy    . Cesarean section      x 2    Social History Gabriela Rice  reports that she quit smoking about 17 years ago. Her smoking use included Cigarettes. She has a 1 pack-year smoking history. She has never used smokeless tobacco. She reports that she does not drink alcohol or use illicit drugs.  family history includes Brain cancer in her maternal uncle; Colon cancer in her maternal aunt; Diabetes in her mother; Heart disease in her maternal grandmother; Multiple sclerosis in her mother; Uterine cancer in her maternal aunt.  Allergies  Allergen Reactions  . Codeine     REACTION: hands swell  . Penicillins     REACTION: seizure as child       PHYSICAL EXAMINATION:  Vital signs: BP 122/84  Pulse 100  Ht 5' 3.5" (1.613 m)  Wt 178 lb (80.74 kg)  BMI 31.03 kg/m2 General: Well-developed, well-nourished, no acute distress HEENT: Sclerae are anicteric, conjunctiva pink. Oral mucosa intact Lungs: Clear Heart: Regular Abdomen: soft, obese, nontender, nondistended, no obvious ascites, no peritoneal signs, normal bowel sounds. No organomegaly. Extremities: No edema Psychiatric: alert and oriented x3. Cooperative   ASSESSMENT:  #1. Autoimmune hepatitis. Significant improvement of liver tests on Imuran (for about 6 weeks now) and prednisone taper. #2. Brother with hemochromatosis. Patient heterozygous for 2 separate mutations with no evidence of iron storage disease #3. Previous evaluation of elevated hepatic transaminases including liver biopsy revealed fatty liver without significant inflammation or fibrosis in August of 2004. #4. Father with cirrhosis. Etiology unclear though significant alcohol history was present #  5. Multiple medical problems including diabetes mellitus and multiple sclerosis #6. History of non-adenomatous colon polyps. Last colonoscopy November 2011   PLAN:  #1. Decrease prednisone to 10 mg daily #2. Increase Imuran  to 150 mg daily. (This is less than the ceiling 2 mg per kilogram) #3. Recheck liver tests as well as CBC in 2 weeks #4. Consider talking to PCP about holding lisinopril for a few days to see if this helps nausea #5. Use MiraLax more regularly to address constipation #6. GI followup in 3 months

## 2013-06-09 ENCOUNTER — Encounter: Payer: Self-pay | Admitting: Internal Medicine

## 2013-06-09 ENCOUNTER — Telehealth: Payer: Self-pay

## 2013-06-09 MED ORDER — ONDANSETRON HCL 4 MG PO TABS: 4.0000 mg | ORAL_TABLET | Freq: Three times a day (TID) | ORAL | Status: DC | PRN

## 2013-06-09 NOTE — Telephone Encounter (Signed)
Refilled Zofran with no refills

## 2013-06-14 ENCOUNTER — Other Ambulatory Visit: Payer: Self-pay

## 2013-06-14 DIAGNOSIS — Z1231 Encounter for screening mammogram for malignant neoplasm of breast: Secondary | ICD-10-CM

## 2013-06-16 ENCOUNTER — Other Ambulatory Visit (INDEPENDENT_AMBULATORY_CARE_PROVIDER_SITE_OTHER): Payer: BC Managed Care – PPO

## 2013-06-16 ENCOUNTER — Other Ambulatory Visit: Payer: Self-pay

## 2013-06-16 DIAGNOSIS — K754 Autoimmune hepatitis: Secondary | ICD-10-CM

## 2013-06-16 LAB — HEPATIC FUNCTION PANEL
ALT: 73 U/L — ABNORMAL HIGH (ref 0–35)
Albumin: 4.2 g/dL (ref 3.5–5.2)
Alkaline Phosphatase: 67 U/L (ref 39–117)
Total Protein: 7.2 g/dL (ref 6.0–8.3)

## 2013-06-16 LAB — CBC WITH DIFFERENTIAL/PLATELET
Basophils Absolute: 0 10*3/uL (ref 0.0–0.1)
Eosinophils Absolute: 0.1 10*3/uL (ref 0.0–0.7)
Hemoglobin: 13.6 g/dL (ref 12.0–15.0)
Lymphocytes Relative: 11.6 % — ABNORMAL LOW (ref 12.0–46.0)
MCHC: 34.5 g/dL (ref 30.0–36.0)
Monocytes Relative: 6.8 % (ref 3.0–12.0)
Neutro Abs: 5.1 10*3/uL (ref 1.4–7.7)
Neutrophils Relative %: 80.5 % — ABNORMAL HIGH (ref 43.0–77.0)
Platelets: 220 10*3/uL (ref 150.0–400.0)
RDW: 14.2 % (ref 11.5–14.6)

## 2013-06-30 ENCOUNTER — Other Ambulatory Visit (INDEPENDENT_AMBULATORY_CARE_PROVIDER_SITE_OTHER): Payer: BC Managed Care – PPO

## 2013-06-30 DIAGNOSIS — K754 Autoimmune hepatitis: Secondary | ICD-10-CM

## 2013-06-30 LAB — HEPATIC FUNCTION PANEL
AST: 58 U/L — ABNORMAL HIGH (ref 0–37)
Alkaline Phosphatase: 66 U/L (ref 39–117)
Bilirubin, Direct: 0.3 mg/dL (ref 0.0–0.3)
Total Bilirubin: 1.2 mg/dL (ref 0.3–1.2)

## 2013-07-05 ENCOUNTER — Ambulatory Visit: Payer: BC Managed Care – PPO

## 2013-07-06 ENCOUNTER — Other Ambulatory Visit: Payer: Self-pay

## 2013-07-06 DIAGNOSIS — K754 Autoimmune hepatitis: Secondary | ICD-10-CM

## 2013-07-08 ENCOUNTER — Ambulatory Visit (INDEPENDENT_AMBULATORY_CARE_PROVIDER_SITE_OTHER): Payer: BC Managed Care – PPO | Admitting: Internal Medicine

## 2013-07-08 ENCOUNTER — Encounter: Payer: Self-pay | Admitting: Internal Medicine

## 2013-07-08 VITALS — BP 132/86 | HR 88 | Temp 98.7°F | Ht 64.0 in | Wt 172.0 lb

## 2013-07-08 DIAGNOSIS — R0600 Dyspnea, unspecified: Secondary | ICD-10-CM

## 2013-07-08 DIAGNOSIS — I10 Essential (primary) hypertension: Secondary | ICD-10-CM

## 2013-07-08 DIAGNOSIS — R0902 Hypoxemia: Secondary | ICD-10-CM

## 2013-07-08 DIAGNOSIS — R0609 Other forms of dyspnea: Secondary | ICD-10-CM

## 2013-07-08 NOTE — Progress Notes (Signed)
Subjective:    Patient ID: Gabriela Rice, female    DOB: [birth year]  MRN: [id number]    Brief patient profile:  56 yowf quit  Mild smoking 1997 no resp problems with MS and chronic fatigue and doe starting around 2013 referred 02/03/13 to pulmonary clinic by Dr Shaune Pollack with nl PFT's 04/05/2013   02/04/2013 1st pulmonary eval cc indolent onset progressive doe x 1 year to point where has to lean on cart when walks at grocery store assoc with new cough x sev months which  typically happens this time every year but this year more of a dry  daytime than a first thiing  in am pattern prod cough.   MS worse x 3 years, f/u Lesia Sago. No dysphagia.  rec ono 02 > start 02 2lpm    History of Present Illness  04/05/2013 f/u ov/Atharv Barriere  Chief Complaint  Patient presents with  . Followup with PFT    Pt states her cough has resolved. Her breathing has also improved. She states that since stating her noct o2 she has had increased nasal d/c with green mucus.    denies limiting sob, cough is gone  - nasal symptoms started 02 rx, bloody to green mucus rec Nasal saline spray > if get worse call [phone] ask Almyra Free to Schedule sinus CT  Try off 02 for now> better off it    07/08/2013 f/u ov/Taevon Aschoff re: h/o resp failure on macrodantin/acei  Chief Complaint  Patient presents with  . Follow-up    Pt states doing well and denies any co's today. Taking pred 5 mg qod.     Not limited by breathing    No obvious daytime variabilty or assoc chronic cough or cp or chest tightness, subjective wheeze overt sinus or hb symptoms. No unusual exp hx or h/o childhood pna/ asthma or premature birth to her knowledge.   Current Medications, Allergies, Past Medical History, Past Surgical History, Family History, and Social History were reviewed in [employer] record.  ROS  The following are not active complaints unless bolded sore throat, dysphagia, dental problems, itching, sneezing,  nasal congestion  or excess/ purulent secretions, ear ache,   fever, chills, sweats, unintended wt loss, pleuritic or exertional cp, hemoptysis,  orthopnea pnd or leg swelling, presyncope, palpitations, heartburn, abdominal pain, anorexia, nausea, vomiting, diarrhea  or change in bowel or urinary habits, change in stools or urine, dysuria,hematuria,  rash, arthralgias, visual complaints, headache, numbness weakness or ataxia or problems with walking or coordination due to MS, no change from baseline,   change in mood/affect or memory.             Objective:   Physical Exam  amb wf nad  04/05/2013          179 vs 172 07/08/13  Wt Readings from Last 3 Encounters:  02/04/13 197 lb 9.6 oz (89.631 kg)  03/29/09 203 lb 6.4 oz (92.262 kg)    HEENT: nl dentition, turbinates, and orophanx. Nl external ear canals without cough reflex   NECK :  without JVD/Nodes/TM/ nl carotid upstrokes bilaterally   LUNGS: no acc muscle use, clear to A and P bilaterally without cough on insp or exp maneuvers   CV:  RRR  no s3 or murmur or increase in P2, no edema   ABD:  soft and nontender with nl excursion in the supine position. No bruits or organomegaly, bowel sounds nl  MS:  warm without deformities, calf tenderness, cyanosis or  clubbing  SKIN: warm and dry without lesions    NEURO:  alert, approp, no deficits    CXR  02/04/2013 : Prominent bronchitic changes with bibasilar atelectasis           Assessment & Plan:

## 2013-07-08 NOTE — Patient Instructions (Addendum)
Be careful on macrodantin to report any cough or shortness of breath to your urologist   Pulmonary follow up can be as needed

## 2013-07-11 DIAGNOSIS — I10 Essential (primary) hypertension: Secondary | ICD-10-CM | POA: Insufficient documentation

## 2013-07-11 NOTE — Assessment & Plan Note (Signed)
-   See sleep study 01/13/13  Neg for sign osa but did desat RA - ONO RA 01/28/13 >  Sat <89% for 54 min 48 sec > 02/15/2013 rec 02 2lpm and recheck ONO on 2lpm  On 02/25/13 > no desats on 2lpm > d/c 02 04/19/2013 at pt request  No obvious symptoms off 02,  Ok to  continue off

## 2013-07-11 NOTE — Assessment & Plan Note (Addendum)
-   02/04/2013  Walked RA x 2 laps @ 185 ft each stopped due to  Fatigue not sob sat 92% - PFTs 04/05/2013 wnl x DLCO 75 corrects to 193  - 07/08/2013  Walked RA x 3 laps @ 185 ft each stopped due to  Leg pain, no sob or desat  Doing well at present though concerned about chronic macrodantin and acei use in this setting > if condition worsens would have low threshold to find substitutes for both.

## 2013-07-11 NOTE — Assessment & Plan Note (Signed)
Adequate control on present rx,  However now on acei  ACE inhibitors are problematic in  pts with airway complaints because  even experienced pulmonologists can't always distinguish ace effects from copd/asthma.  By themselves they don't actually cause a problem, much like oxygen can't by itself start a fire, but they certainly serve as a powerful catalyst or enhancer for any "fire"  or inflammatory process in the upper airway, be it caused by an ET  tube or more commonly reflux (especially in the obese or pts with known GERD or who are on biphoshonates).     For now ok to continue but low threshold to change to arb for persistent cough or recurrent sob so as not to muddy the waters in interpretation of non-specific resp complaints.

## 2013-07-12 ENCOUNTER — Ambulatory Visit
Admission: RE | Admit: 2013-07-12 | Discharge: 2013-07-12 | Disposition: A | Discharge status: Home / Self Care | Payer: BC Managed Care – PPO | Source: Ambulatory Visit

## 2013-07-12 DIAGNOSIS — Z1231 Encounter for screening mammogram for malignant neoplasm of breast: Secondary | ICD-10-CM

## 2013-07-13 ENCOUNTER — Other Ambulatory Visit: Payer: Self-pay | Admitting: Neurology

## 2013-07-14 NOTE — Telephone Encounter (Signed)
Rx signed and faxed.

## 2013-08-04 ENCOUNTER — Other Ambulatory Visit (INDEPENDENT_AMBULATORY_CARE_PROVIDER_SITE_OTHER): Payer: BC Managed Care – PPO

## 2013-08-04 DIAGNOSIS — K754 Autoimmune hepatitis: Secondary | ICD-10-CM

## 2013-08-04 LAB — HEPATIC FUNCTION PANEL
ALT: 25 U/L (ref 0–35)
AST: 35 U/L (ref 0–37)
Albumin: 4.2 g/dL (ref 3.5–5.2)
Bilirubin, Direct: 0.2 mg/dL (ref 0.0–0.3)
Total Protein: 7.6 g/dL (ref 6.0–8.3)

## 2013-08-05 ENCOUNTER — Encounter: Payer: Self-pay | Admitting: Internal Medicine

## 2013-08-05 ENCOUNTER — Telehealth: Payer: Self-pay

## 2013-08-05 NOTE — Telephone Encounter (Signed)
Spoke with pt and let her know that Dr. Marina Goodell stated she should be off of her prednisone and stay on other medications at the same dose. Pt wants to make sure she should be on 150mg . She is supposed to have LFT's in 2 mths. Pt wants to know if she needs to have any other labs. Please advise.

## 2013-08-05 NOTE — Telephone Encounter (Signed)
Per Dr. Marina Goodell pt needs to stay on same dose, have cbc and lft's in . Pt aware.

## 2013-08-05 NOTE — Telephone Encounter (Signed)
Gabriela Rice, she needs to continue on azathioprine at current dose. I can discuss long-term plans with her at her upcoming office appointment. She is due to see me this December-please make sure she has an appointment. Also, she will need periodic blood counts while on azathioprine. She should have a CBC with her next LFTs in 2 months, as previously recommended . Thanks. Please convert this correspondence to a phone note. Thanks    Spoke with pt and she is aware, OV for December scheduled.

## 2013-08-13 ENCOUNTER — Encounter: Payer: Self-pay | Admitting: Neurology

## 2013-08-13 ENCOUNTER — Ambulatory Visit (INDEPENDENT_AMBULATORY_CARE_PROVIDER_SITE_OTHER): Payer: BC Managed Care – PPO | Admitting: Neurology

## 2013-08-13 VITALS — BP 143/93 | HR 101 | Wt 173.0 lb

## 2013-08-13 DIAGNOSIS — G35 Multiple sclerosis: Secondary | ICD-10-CM

## 2013-08-13 DIAGNOSIS — G35D Multiple sclerosis, unspecified: Secondary | ICD-10-CM

## 2013-08-13 DIAGNOSIS — R269 Unspecified abnormalities of gait and mobility: Secondary | ICD-10-CM

## 2013-08-13 NOTE — Progress Notes (Signed)
Reason for visit: Multiple sclerosis  Gabriela Rice is a 57 y.o. female  History of present illness:  Ms. Feild is a 57 year old right-handed white female with a history of multiple sclerosis and a gait disorder. The patient has been found to have an autoimmune hepatitis. The patient was taken off of Tecfidera when the liver enzymes elevated. The patient has been treated with prednisone which was not effective. The patient was taken off of prednisone, and she was placed on Imuran. The patient is on 150 mg daily of this medication, and the liver enzymes have normalized. The patient had significant problems with her diabetes on the prednisone. The patient indicates that she has had some increased fatigue, and difficulty with walking. This has just recently improved. The patient does have some difficulty fully emptying the bladder, and she may have some occasional incontinence. The patient has had some blurring of vision recently as her blood sugars have elevated. The patient denies any double vision. The patient has stumbled, but she has not sustained any falls. The patient returns to this office for an evaluation. In the past, she has had some liver enzyme elevations on the beta interferons, and she has done well with Copaxone.  Past Medical History  Diagnosis Date  . DM (diabetes mellitus)   . Hyperlipidemia   . Multiple sclerosis   . Allergic rhinitis   . Elevated LFTs   . Obesity   . Vitamin D deficiency   . Abnormality of gait 02/16/2013  . Colon polyps   . Diverticulosis   . Hemorrhoids   . Hepatitis     with cholestasis  . Hemorrhoids   . Autoimmune hepatitis     Past Surgical History  Procedure Laterality Date  . Myoectomy    . Cesarean section      x 2    Family History  Problem Relation Age of Onset  . Heart disease Maternal Grandmother   . Colon cancer Maternal Aunt   . Uterine cancer Maternal Aunt   . Brain cancer Maternal Uncle   . Diabetes Mother   .  Multiple sclerosis Mother     Social history:  reports that she quit smoking about 17 years ago. Her smoking use included Cigarettes. She has a 1 pack-year smoking history. She has never used smokeless tobacco. She reports that she does not drink alcohol or use illicit drugs.  Medications:  Current Outpatient Prescriptions on File Prior to Visit  Medication Sig Dispense Refill  . ALPRAZolam (XANAX) 1 MG tablet take 1 tablet by mouth at bedtime  90 tablet  1  . aspirin EC 81 MG tablet Take 81 mg by mouth daily.      Marland Kitchen azaTHIOprine (IMURAN) 50 MG tablet Take 3 tablets (150 mg total) by mouth daily.  90 tablet  3  . cetirizine (ZYRTEC) 10 MG tablet Take 10 mg by mouth daily.      . Cholecalciferol (VITAMIN D) 2000 UNITS CAPS Take 2,000 Units by mouth daily.       Marland Kitchen ibuprofen (ADVIL,MOTRIN) 200 MG tablet Take 200 mg by mouth every 6 (six) hours as needed for pain.      Marland Kitchen lisinopril (PRINIVIL,ZESTRIL) 10 MG tablet Take 1 tablet by mouth daily.      . metFORMIN (GLUCOPHAGE) 500 MG tablet Take 500 mg by mouth 2 (two) times daily with a meal.       . ondansetron (ZOFRAN) 4 MG tablet Take 1 tablet (4 mg total) by  mouth every 8 (eight) hours as needed for nausea.  30 tablet  0  . ONE TOUCH ULTRA TEST test strip       . polyethylene glycol powder (GLYCOLAX/MIRALAX) powder Take 17 g by mouth daily.  255 g  3   No current facility-administered medications on file prior to visit.      Allergies  Allergen Reactions  . Codeine     REACTION: hands swell  . Penicillins     REACTION: seizure as child    ROS:  Out of a complete 14 system review of symptoms, the patient complains only of the following symptoms, and all other reviewed systems are negative.  Weight loss, fatigue Blurred vision, loss of vision, cough Constipation Urination problems Feeling hot Muscle cramps, achy muscles Allergies, runny nose Headache, numbness, weakness, dizziness Gait disturbance Anxiety, insomnia, change in  appetite, restless legs  Blood pressure 143/93, pulse 101, weight 173 lb (78.472 kg).  Physical Exam  General: The patient is alert and cooperative at the time of the examination. The patient is minimally obese.  Head: Pupils are equal, round, and reactive to light. Discs are flat bilaterally.  Skin: Extremities are without significant edema.  Neurologic Exam  Mental status: The patient is alert and oriented x 3 at the time of the examination.  Cranial nerves: Facial symmetry is present. There is good sensation of the face to pinprick and soft touch bilaterally. The strength of the facial muscles and the muscles to head turning and shoulder shrug are normal bilaterally. Speech is well enunciated, no aphasia or dysarthria is noted. Extraocular movements are full. Visual fields are full.  Motor: The motor testing reveals 5 over 5 strength of all 4 extremities. Good symmetric motor tone is noted throughout. The patient is able to arise from a seated position with arms crossed.  Sensory: Sensory testing is intact to soft touch on all 4 extremities. No evidence of extinction is noted.  Coordination: Cerebellar testing reveals good finger-nose-finger and heel-to-shin bilaterally.  Gait and station: Gait is slightly unsteady, the patient uses a cane for ambulation. Tandem gait is unsteady. Romberg is negative. No drift is seen.  Reflexes: Deep tendon reflexes are symmetric and normal bilaterally. Toes are downgoing bilaterally.   Assessment/Plan:  One. Multiple sclerosis  2. Gait disorder  3. Autoimmune hepatitis  The patient recently has had normalization of her liver enzymes. The patient is on Imuran. For this reason, I am not clear that she could go back on Tecfidera. The patient could likely take Copaxone and do well with this. The patient will be placed back on this medication, and she will followup in 6 months.   Marlan Palau MD 08/13/2013 8:19 PM  [facility] [address] [place], [place] [address]  Phone [phone] Fax [phone]

## 2013-08-13 NOTE — Patient Instructions (Signed)
Multiple Sclerosis Multiple sclerosis (MS) is a disease of the central nervous system. Its cause is unknown. It is more common in the northern states than in the southern states. There is a higher incidence of MS in women. There is a wide variation in the symptoms (problems) of MS. This is because of the many different ways it affects the central nervous system. It often comes on in episodes or attacks. These attacks may last weeks to months. There may be long periods of nearly no problems between attacks. The main symptoms include visual problems (associated with eye pain), numbness, weakness, and paralysis in extremities (arms/hands and legs/feet). There may also be tremors and problems with balance and walking. The age when MS starts is variable. Advances in medicine continue to improve the treatment of this illness. There is no known cure for MS but there are medications that help. MS is not an inherited illness, although your risk of getting this disease is higher if you have a relative with MS. The best radiologic (x-ray) study for MS is an MRI (magnetic resonance imaging). There are medications available to decrease the number and frequency of attacks. SYMPTOMS  The symptoms of MS are caused by loss of insulation (myelin) of the nerves of the brain. When this happens, brain signals do not get transmitted properly or may not get transmitted at all. Some of the problems caused by this include:   Numbness.  Weakness.  Paralysis in extremities.  Visual problems, eye pain.  Balance problems.  Tremors. DIAGNOSIS  Your caregiver can do studies on you to make this diagnosis. This may include specialized X-rays and spinal fluid studies. HOME CARE INSTRUCTIONS   Take medications as directed by your caregiver. Baclofen is a drug commonly used to reduce muscle spasticity. Steroids are often used for short term relief.  Exercise as directed.  Use physical and occupational therapy as directed by  your caregiver. Careful attention to this medical care can help avoid depression.  See your caregiver if you begin to have problems with depression. This is a common problem in MS. Patients often continue to work many years after the diagnosis of MS. Document Released: 09/13/2000 Document Revised: 12/09/2011 Document Reviewed: 04/22/2007 [employer] Patient Information 2014 [employer].  

## 2013-09-13 ENCOUNTER — Encounter: Payer: Self-pay | Admitting: Internal Medicine

## 2013-09-13 ENCOUNTER — Ambulatory Visit (INDEPENDENT_AMBULATORY_CARE_PROVIDER_SITE_OTHER): Payer: BC Managed Care – PPO | Admitting: Internal Medicine

## 2013-09-13 VITALS — BP 118/90 | HR 96 | Ht 63.5 in | Wt 171.1 lb

## 2013-09-13 DIAGNOSIS — K754 Autoimmune hepatitis: Secondary | ICD-10-CM

## 2013-09-13 DIAGNOSIS — Z86010 Personal history of colonic polyps: Secondary | ICD-10-CM

## 2013-09-13 DIAGNOSIS — K5900 Constipation, unspecified: Secondary | ICD-10-CM

## 2013-09-13 MED ORDER — POLYETHYLENE GLYCOL 3350 17 GM/SCOOP PO POWD: ORAL | Status: DC

## 2013-09-13 NOTE — Progress Notes (Signed)
HISTORY OF PRESENT ILLNESS:  Gabriela Rice is a 57 y.o. female with diabetes, hyperlipidemia, and multiple sclerosis. She is followed in this office for autoimmune hepatitis diagnosed around May 2014. Also, constipation. She was initially placed on prednisone (with worsening diabetes) and subsequently Imuran. Currently on Imuran 150 mg daily as a single agent. Liver function tests from one month ago were normal. She is off prednisone. Good control of diabetes last hemoglobin A1c 5.6. Recently restarted on Copaxone for multiple sclerosis. Her only GI complaint is that of constipation. Currently taking MiraLax twice daily.  REVIEW OF SYSTEMS:  All non-GI ROS negative except for muscle weakness  Past Medical History  Diagnosis Date  . DM (diabetes mellitus)   . Hyperlipidemia   . Multiple sclerosis   . Allergic rhinitis   . Elevated LFTs   . Obesity   . Vitamin D deficiency   . Abnormality of gait 02/16/2013  . Colon polyps   . Diverticulosis   . Hemorrhoids   . Hepatitis     with cholestasis  . Hemorrhoids   . Autoimmune hepatitis     Past Surgical History  Procedure Laterality Date  . Myoectomy    . Cesarean section      x 2    Social History Gabriela Rice  reports that she quit smoking about 17 years ago. Her smoking use included Cigarettes. She has a 1 pack-year smoking history. She has never used smokeless tobacco. She reports that she does not drink alcohol or use illicit drugs.  family history includes Brain cancer in her maternal uncle; Colon cancer in her maternal aunt; Diabetes in her mother; Heart disease in her maternal grandmother; Multiple sclerosis in her mother; Uterine cancer in her maternal aunt.  Allergies  Allergen Reactions  . Codeine     REACTION: hands swell  . Penicillins     REACTION: seizure as child       PHYSICAL EXAMINATION: Vital signs: BP 118/90  Pulse 96  Ht 5' 3.5" (1.613 m)  Wt 171 lb 2 oz (77.622 kg)  BMI 29.83  kg/m2 General: Well-developed, well-nourished, no acute distress HEENT: Sclerae are anicteric, conjunctiva pink. Oral mucosa intact Lungs: Clear Heart: Regular Abdomen: soft, nontender, nondistended, no obvious ascites, no peritoneal signs, normal bowel sounds. No organomegaly. Extremities: No edema Psychiatric: alert and oriented x3. Cooperative Neuro: No asterixis   ASSESSMENT:  #1. Autoimmune hepatitis. Recent biopsies supportive and reviewed at [facility] as well. Liver function tests have now normalized on Imuran 150 mg daily #2. Brother with hemochromatosis. Patient had her esophagus for 2 separate mutations with no evidence of iron storage disease on biopsy #3. Previous evaluation of elevated liver tests, including liver biopsy elsewhere, with fatty liver without fibrosis. #4. Father with history of cirrhosis. Etiology uncertain, though significant alcohol history reported excellent #5. Multiple medical problems including diabetes mellitus and multiple sclerosis #6. History of non-adenomatous colon polyps November 2011 #7. Functional constipation   PLAN:  #1. Continue Imuran 150 mg daily #2. Recheck liver tests next month as previously planned #3. Increase MiraLax as needed to achieve desired result. Prescription rewritten #4. Routine GI office followup in 6 months

## 2013-09-13 NOTE — Patient Instructions (Signed)
We have sent the following medications to your pharmacy for you to pick up at your convenience:  Glycolax.  Mix 17g (one capful) in 8 ounces or water or juice three times a day.  Your LFTs are in the computer to be drawn next month.    Please follow up with Dr. Marina Goodell in 6 months

## 2013-10-05 ENCOUNTER — Telehealth: Payer: Self-pay | Admitting: Neurology

## 2013-10-05 ENCOUNTER — Other Ambulatory Visit: Payer: Self-pay

## 2013-10-05 DIAGNOSIS — R945 Abnormal results of liver function studies: Principal | ICD-10-CM

## 2013-10-05 DIAGNOSIS — R7989 Other specified abnormal findings of blood chemistry: Secondary | ICD-10-CM

## 2013-10-05 NOTE — Telephone Encounter (Signed)
I have contacted [facility] and provided all requested info.  They will review this and notify us and the patient of the outcome.  I called the patient.  She is aware.

## 2013-10-05 NOTE — Telephone Encounter (Signed)
PT called stating rec'd letter from [place].  Letter stated that the Doctor needs to call to discuss COPAXONE 40 MG/ML SOSY or they will no longer pay for the Rx. Please call [facility] at [phone] to arrange a review.  Please call patient to let her know that this has been taken care of.  Thank you.

## 2013-10-06 ENCOUNTER — Other Ambulatory Visit (INDEPENDENT_AMBULATORY_CARE_PROVIDER_SITE_OTHER): Payer: BC Managed Care – PPO

## 2013-10-06 ENCOUNTER — Other Ambulatory Visit: Payer: Self-pay

## 2013-10-06 DIAGNOSIS — R945 Abnormal results of liver function studies: Principal | ICD-10-CM

## 2013-10-06 DIAGNOSIS — R7989 Other specified abnormal findings of blood chemistry: Secondary | ICD-10-CM

## 2013-10-06 LAB — HEPATIC FUNCTION PANEL
ALBUMIN: 4.2 g/dL (ref 3.5–5.2)
ALT: 21 U/L (ref 0–35)
AST: 27 U/L (ref 0–37)
Alkaline Phosphatase: 76 U/L (ref 39–117)
BILIRUBIN DIRECT: 0.1 mg/dL (ref 0.0–0.3)
Total Bilirubin: 0.6 mg/dL (ref 0.3–1.2)
Total Protein: 7.4 g/dL (ref 6.0–8.3)

## 2013-10-07 ENCOUNTER — Other Ambulatory Visit: Payer: Self-pay

## 2013-10-07 DIAGNOSIS — K754 Autoimmune hepatitis: Secondary | ICD-10-CM

## 2013-10-13 ENCOUNTER — Other Ambulatory Visit: Payer: Self-pay | Admitting: Internal Medicine

## 2013-11-04 ENCOUNTER — Ambulatory Visit: Payer: BC Managed Care – PPO | Admitting: Internal Medicine

## 2013-11-15 ENCOUNTER — Ambulatory Visit: Payer: BC Managed Care – PPO | Admitting: Internal Medicine

## 2013-11-15 ENCOUNTER — Telehealth: Payer: Self-pay | Admitting: Internal Medicine

## 2013-11-15 NOTE — Telephone Encounter (Signed)
Yes, with normal monitoring of liver tests as would be done otherwise

## 2013-11-15 NOTE — Telephone Encounter (Signed)
Spoke with pt and she is aware.

## 2013-11-15 NOTE — Telephone Encounter (Signed)
Pt states her PCP wants to put her on Pravachol but she wanted to check with Dr. Henrene Pastor and see if he thinks it is ok for her to take. Please advise.

## 2013-11-19 ENCOUNTER — Other Ambulatory Visit (INDEPENDENT_AMBULATORY_CARE_PROVIDER_SITE_OTHER): Payer: BC Managed Care – PPO

## 2013-11-19 ENCOUNTER — Other Ambulatory Visit: Payer: Self-pay

## 2013-11-19 DIAGNOSIS — K754 Autoimmune hepatitis: Secondary | ICD-10-CM

## 2013-11-19 DIAGNOSIS — R945 Abnormal results of liver function studies: Principal | ICD-10-CM

## 2013-11-19 DIAGNOSIS — R7989 Other specified abnormal findings of blood chemistry: Secondary | ICD-10-CM

## 2013-11-19 LAB — HEPATIC FUNCTION PANEL
ALBUMIN: 4.3 g/dL (ref 3.5–5.2)
ALT: 21 U/L (ref 0–35)
AST: 22 U/L (ref 0–37)
Alkaline Phosphatase: 66 U/L (ref 39–117)
Bilirubin, Direct: 0.1 mg/dL (ref 0.0–0.3)
TOTAL PROTEIN: 7.6 g/dL (ref 6.0–8.3)
Total Bilirubin: 0.6 mg/dL (ref 0.3–1.2)

## 2013-11-19 LAB — PROTIME-INR
INR: 1.1 ratio — ABNORMAL HIGH (ref 0.8–1.0)
PROTHROMBIN TIME: 11.7 s (ref 10.2–12.4)

## 2013-11-19 LAB — CBC WITH DIFFERENTIAL/PLATELET
BASOS ABS: 0 10*3/uL (ref 0.0–0.1)
BASOS PCT: 0.4 % (ref 0.0–3.0)
Eosinophils Absolute: 0.1 10*3/uL (ref 0.0–0.7)
Eosinophils Relative: 3.4 % (ref 0.0–5.0)
HCT: 39.7 % (ref 36.0–46.0)
Hemoglobin: 13.4 g/dL (ref 12.0–15.0)
LYMPHS PCT: 21.8 % (ref 12.0–46.0)
Lymphs Abs: 0.9 10*3/uL (ref 0.7–4.0)
MCHC: 33.9 g/dL (ref 30.0–36.0)
MCV: 93.9 fl (ref 78.0–100.0)
MONO ABS: 0.4 10*3/uL (ref 0.1–1.0)
Monocytes Relative: 9 % (ref 3.0–12.0)
NEUTROS PCT: 65.4 % (ref 43.0–77.0)
Neutro Abs: 2.7 10*3/uL (ref 1.4–7.7)
PLATELETS: 297 10*3/uL (ref 150.0–400.0)
RBC: 4.23 Mil/uL (ref 3.87–5.11)
RDW: 15 % — AB (ref 11.5–14.6)
WBC: 4.2 10*3/uL — ABNORMAL LOW (ref 4.5–10.5)

## 2013-12-06 ENCOUNTER — Telehealth: Payer: Self-pay | Admitting: *Deleted

## 2013-12-06 NOTE — Telephone Encounter (Signed)
I called the patient. The patient has had some low back pain issues for number of years, worsening since September 2014, particularly worse since the beginning of this year. The patient is now having significant pain in the back, in the right hip and thigh more so than the left side. The pain will come and go, not present all the time. The patient feels as if the legs will buckle at times. I'll get a work in revisit for this patient.

## 2013-12-06 NOTE — Telephone Encounter (Signed)
Spoke with patient and she said that she is having back pain radiating down into her legs, hard to walk, right leg is worse, severe unbearable pain in the right upper thigh

## 2013-12-07 ENCOUNTER — Encounter: Payer: Self-pay | Admitting: Neurology

## 2013-12-07 ENCOUNTER — Ambulatory Visit (INDEPENDENT_AMBULATORY_CARE_PROVIDER_SITE_OTHER): Payer: BC Managed Care – PPO | Admitting: Neurology

## 2013-12-07 VITALS — BP 128/79 | HR 87 | Wt 179.0 lb

## 2013-12-07 DIAGNOSIS — M545 Low back pain, unspecified: Secondary | ICD-10-CM

## 2013-12-07 DIAGNOSIS — R269 Unspecified abnormalities of gait and mobility: Secondary | ICD-10-CM

## 2013-12-07 DIAGNOSIS — G35 Multiple sclerosis: Secondary | ICD-10-CM

## 2013-12-07 NOTE — Progress Notes (Signed)
Reason for visit: Multiple sclerosis  Gabriela Rice is an 58 y.o. female  History of present illness:  Gabriela Rice is a 58 year old right-handed white female with a history of multiple sclerosis with an associated gait disorder. The patient is on Copaxone at this point, and she is on Imuran for autoimmune hepatitis. The patient is tolerating the Copaxone well. Within the last month or so, the patient has had a significant worsening of back pain that is mainly affecting the right leg, but it will affect the left leg to some degree. The patient has pain in the back, going into the right hip, and down the right thigh to the knee. The patient reports that there is pain whether she is sitting, standing, or lying down. The patient notes that the pain will worsen if she bends or stoops over. The patient feels as if the right leg is somewhat weak. The patient uses a cane for ambulation, she reports no recent falls. The patient denies any problems controlling the bowels or bladder, but she has had a lot of problems with constipation recently. The patient is taking some Advil for pain. The patient comes in for an evaluation.  Past Medical History  Diagnosis Date  . DM (diabetes mellitus)   . Hyperlipidemia   . Multiple sclerosis   . Allergic rhinitis   . Elevated LFTs   . Obesity   . Vitamin D deficiency   . Abnormality of gait 02/16/2013  . Colon polyps   . Diverticulosis   . Hemorrhoids   . Hepatitis     with cholestasis  . Hemorrhoids   . Autoimmune hepatitis     Past Surgical History  Procedure Laterality Date  . Myoectomy    . Cesarean section      x 2    Family History  Problem Relation Age of Onset  . Heart disease Maternal Grandmother   . Colon cancer Maternal Aunt   . Uterine cancer Maternal Aunt   . Brain cancer Maternal Uncle   . Diabetes Mother   . Multiple sclerosis Mother     Social history:  reports that she quit smoking about 18 years ago. Her smoking use  included Cigarettes. She has a 1 pack-year smoking history. She has never used smokeless tobacco. She reports that she does not drink alcohol or use illicit drugs.    Allergies  Allergen Reactions  . Codeine     REACTION: hands swell  . Penicillins     REACTION: seizure as child    Medications:  Current Outpatient Prescriptions on File Prior to Visit  Medication Sig Dispense Refill  . ALPRAZolam (XANAX) 1 MG tablet take 1 tablet by mouth at bedtime  90 tablet  1  . aspirin EC 81 MG tablet Take 81 mg by mouth daily.      Marland Kitchen azaTHIOprine (IMURAN) 50 MG tablet take 3 tablets once daily  90 tablet  3  . cetirizine (ZYRTEC) 10 MG tablet Take 10 mg by mouth daily.      . Cholecalciferol (VITAMIN D) 2000 UNITS CAPS Take 2,000 Units by mouth daily.       Marland Kitchen COPAXONE 40 MG/ML SOSY Inject 40 mg into the skin 3 (three) times a week.       Marland Kitchen ibuprofen (ADVIL,MOTRIN) 200 MG tablet Take 200 mg by mouth every 6 (six) hours as needed for pain.      Marland Kitchen losartan (COZAAR) 100 MG tablet Take 100 mg by mouth daily.       Marland Kitchen  metFORMIN (GLUCOPHAGE) 500 MG tablet Take 500 mg by mouth 2 (two) times daily with a meal.       . ondansetron (ZOFRAN) 4 MG tablet Take 1 tablet (4 mg total) by mouth every 8 (eight) hours as needed for nausea.  30 tablet  0  . ONE TOUCH ULTRA TEST test strip       . polyethylene glycol powder (GLYCOLAX/MIRALAX) powder Mix 17g in 8 ounces of water or juice three times a day  850 g  3   No current facility-administered medications on file prior to visit.    ROS:  Out of a complete 14 system review of symptoms, the patient complains only of the following symptoms, and all other reviewed systems are negative.  Fatigue Heat intolerance Swollen abdomen, abdominal pain, constipation Restless legs, insomnia Incontinence of bladder, frequency of urination Low back pain, achy muscles, walking difficulties Dizziness, numbness, weakness Anxiety  Blood pressure 128/79, pulse 87, weight 179  lb (81.194 kg).  Physical Exam  General: The patient is alert and cooperative at the time of the examination. The patient is moderately obese.  Neuromuscular: The patient has full range of movement of the low back, but the patient reports discomfort with flexion. No significant pain with palpation of the low back is noted. No pain over the SI joints is noted. There is no discomfort with internal and external rotation of the hips on either side.  Skin: No significant peripheral edema is noted.   Neurologic Exam  Mental status: The patient is oriented x 3.  Cranial nerves: Facial symmetry is present. Speech is normal, no aphasia or dysarthria is noted. Extraocular movements are full. Visual fields are full. Pupils are equal, round, and reactive to light. Discs are flat bilaterally.  Motor: The patient has good strength in all 4 extremities, with exception of 4/5 proximal strength in the left leg, 4+/5 strength in the right leg proximally.  Sensory examination: Soft touch sensation on the face, arms, and legs is symmetric.  Coordination: The patient has good finger-nose-finger and heel-to-shin bilaterally.  Gait and station: The patient has a slightly wide-based gait, the patient walks with a cane. Tandem gait is unsteady. Romberg is negative. No drift is seen.  Reflexes: Deep tendon reflexes are symmetric.   Assessment/Plan:  One. Multiple sclerosis  2. Low back pain, right greater than left lower extremity discomfort   The current pain problems that the patient is experiencing likely are not related to multiple sclerosis. The patient does have a history of diabetes. The patient will be set up for MRI evaluation of the lumbosacral spine. The pain distribution could be related to facet joint arthritis. The patient will followup in 3-4 months. If the MRI does not explain the current pain syndrome, the patient will be set up for EMG and nerve conduction study evaluation.  Jill Alexanders MD 12/07/2013 7:34 PM  [facility] [address] [place], [place] [address]  Phone [phone] Fax [phone]

## 2013-12-07 NOTE — Patient Instructions (Signed)
Back Pain, Adult Low back pain is very common. About 1 in 5 people have back pain.The cause of low back pain is rarely dangerous. The pain often gets better over time.About half of people with a sudden onset of back pain feel better in just 2 weeks. About 8 in 10 people feel better by 6 weeks.  CAUSES Some common causes of back pain include:  Strain of the muscles or ligaments supporting the spine.  Wear and tear (degeneration) of the spinal discs.  Arthritis.  Direct injury to the back. DIAGNOSIS Most of the time, the direct cause of low back pain is not known.However, back pain can be treated effectively even when the exact cause of the pain is unknown.Answering your caregiver's questions about your overall health and symptoms is one of the most accurate ways to make sure the cause of your pain is not dangerous. If your caregiver needs more information, he or she may order lab work or imaging tests (X-rays or MRIs).However, even if imaging tests show changes in your back, this usually does not require surgery. HOME CARE INSTRUCTIONS For many people, back pain returns.Since low back pain is rarely dangerous, it is often a condition that people can learn to manageon their own.   Remain active. It is stressful on the back to sit or stand in one place. Do not sit, drive, or stand in one place for more than 30 minutes at a time. Take short walks on level surfaces as soon as pain allows.Try to increase the length of time you walk each day.  Do not stay in bed.Resting more than 1 or 2 days can delay your recovery.  Do not avoid exercise or work.Your body is made to move.It is not dangerous to be active, even though your back may hurt.Your back will likely heal faster if you return to being active before your pain is gone.  Pay attention to your body when you bend and lift. Many people have less discomfortwhen lifting if they bend their knees, keep the load close to their bodies,and  avoid twisting. Often, the most comfortable positions are those that put less stress on your recovering back.  Find a comfortable position to sleep. Use a firm mattress and lie on your side with your knees slightly bent. If you lie on your back, put a pillow under your knees.  Only take over-the-counter or prescription medicines as directed by your caregiver. Over-the-counter medicines to reduce pain and inflammation are often the most helpful.Your caregiver may prescribe muscle relaxant drugs.These medicines help dull your pain so you can more quickly return to your normal activities and healthy exercise.  Put ice on the injured area.  Put ice in a plastic bag.  Place a towel between your skin and the bag.  Leave the ice on for 15-20 minutes, 03-04 times a day for the first 2 to 3 days. After that, ice and heat may be alternated to reduce pain and spasms.  Ask your caregiver about trying back exercises and gentle massage. This may be of some benefit.  Avoid feeling anxious or stressed.Stress increases muscle tension and can worsen back pain.It is important to recognize when you are anxious or stressed and learn ways to manage it.Exercise is a great option. SEEK MEDICAL CARE IF:  You have pain that is not relieved with rest or medicine.  You have pain that does not improve in 1 week.  You have new symptoms.  You are generally not feeling well. SEEK   IMMEDIATE MEDICAL CARE IF:   You have pain that radiates from your back into your legs.  You develop new bowel or bladder control problems.  You have unusual weakness or numbness in your arms or legs.  You develop nausea or vomiting.  You develop abdominal pain.  You feel faint. Document Released: 09/16/2005 Document Revised: 03/17/2012 Document Reviewed: 02/04/2011 [employer] Patient Information 2014 [employer].  

## 2013-12-09 DIAGNOSIS — M549 Dorsalgia, unspecified: Secondary | ICD-10-CM

## 2013-12-10 ENCOUNTER — Other Ambulatory Visit: Payer: Self-pay | Admitting: Neurology

## 2013-12-10 ENCOUNTER — Telehealth: Payer: Self-pay | Admitting: Neurology

## 2013-12-10 DIAGNOSIS — G35 Multiple sclerosis: Secondary | ICD-10-CM

## 2013-12-10 DIAGNOSIS — R269 Unspecified abnormalities of gait and mobility: Secondary | ICD-10-CM

## 2013-12-10 DIAGNOSIS — M545 Low back pain, unspecified: Secondary | ICD-10-CM

## 2013-12-10 DIAGNOSIS — M47817 Spondylosis without myelopathy or radiculopathy, lumbosacral region: Secondary | ICD-10-CM

## 2013-12-10 NOTE — Telephone Encounter (Signed)
I called the patient. MRI of the lumbosacral spine shows severe degenerative changes at the L2-3 level. The patient likely has a facet joint related and degenerative related pain. We will try an epidural steroid injection, if this is not effective, we will consider a referral to a pain Center.  MRI lumbar spine 12/10/2013:  IMPRESSION: Abnormal MRI scan of the lumbar spine showing prominent spondylitic changes throughout most severe at L2-3 where there is severe endplate marrow degenerative changes as well as disc osteophyte protrusion resulting in left more than right foraminal narrowing.

## 2013-12-11 ENCOUNTER — Encounter: Payer: Self-pay | Admitting: *Deleted

## 2013-12-13 ENCOUNTER — Ambulatory Visit (INDEPENDENT_AMBULATORY_CARE_PROVIDER_SITE_OTHER): Payer: BC Managed Care – PPO | Admitting: Internal Medicine

## 2013-12-13 ENCOUNTER — Encounter: Payer: Self-pay | Admitting: Internal Medicine

## 2013-12-13 ENCOUNTER — Other Ambulatory Visit: Payer: Self-pay | Admitting: Neurology

## 2013-12-13 VITALS — BP 112/80 | HR 64 | Ht 63.5 in | Wt 177.8 lb

## 2013-12-13 DIAGNOSIS — M47817 Spondylosis without myelopathy or radiculopathy, lumbosacral region: Secondary | ICD-10-CM

## 2013-12-13 DIAGNOSIS — K5900 Constipation, unspecified: Secondary | ICD-10-CM

## 2013-12-13 DIAGNOSIS — R945 Abnormal results of liver function studies: Secondary | ICD-10-CM

## 2013-12-13 DIAGNOSIS — R7989 Other specified abnormal findings of blood chemistry: Secondary | ICD-10-CM

## 2013-12-13 DIAGNOSIS — Z86010 Personal history of colonic polyps: Secondary | ICD-10-CM

## 2013-12-13 DIAGNOSIS — K754 Autoimmune hepatitis: Secondary | ICD-10-CM

## 2013-12-13 NOTE — Progress Notes (Signed)
HISTORY OF PRESENT ILLNESS:  Gabriela Rice is a 58 y.o. female with multiple medical problems including diabetes mellitus, hyperlipidemia, and multiple sclerosis. She is followed in this office for autoimmune hepatitis diagnosed in May of 2014. Initially placed on prednisone (with worsening of diabetes) and subsequently Imuran. Did have liver biopsy. Last evaluated 09/13/2013. Doing well at that time with normalized liver function tests on Imuran 150 mg daily. Did complain of constipation for which MiraLax was recommended. She presents today for followup. Recently had problems with right back pain for which she has seen urology (negative CT scan-reviewed) and undergone MRI of the back. Apparently has spinal disc disease. She feels her multiple sclerosis is under adequate control. No additional GI complaints. Now on Pravachol for lipids. Last liver tests 1 month ago were normal.  REVIEW OF SYSTEMS:  All non-GI ROS negative except for back pain  Past Medical History  Diagnosis Date  . DM (diabetes mellitus)   . Hyperlipidemia   . Multiple sclerosis   . Allergic rhinitis   . Elevated LFTs   . Obesity   . Vitamin D deficiency   . Abnormality of gait 02/16/2013  . Colon polyps   . Diverticulosis   . Hemorrhoids   . Hepatitis     with cholestasis  . Hemorrhoids   . Autoimmune hepatitis     Past Surgical History  Procedure Laterality Date  . Myoectomy    . Cesarean section      x 2    Social History Gabriela Rice  reports that she quit smoking about 18 years ago. Her smoking use included Cigarettes. She has a 1 pack-year smoking history. She has never used smokeless tobacco. She reports that she does not drink alcohol or use illicit drugs.  family history includes Brain cancer in her maternal uncle; Colon cancer in her maternal aunt; Diabetes in her mother; Heart disease in her maternal grandmother; Multiple sclerosis in her mother; Uterine cancer in her maternal  aunt.  Allergies  Allergen Reactions  . Codeine     REACTION: hands swell  . Penicillins     REACTION: seizure as child       PHYSICAL EXAMINATION: Vital signs: BP 112/80  Pulse 64  Ht 5' 3.5" (1.613 m)  Wt 177 lb 12.8 oz (80.65 kg)  BMI 31.00 kg/m2 General: Well-developed, well-nourished, no acute distress. Sitting in chair HEENT: Sclerae are anicteric, conjunctiva pink. Oral mucosa intact Lungs: Clear Heart: Regular Abdomen: soft, obese, nontender, nondistended, no obvious ascites, no peritoneal signs, normal bowel sounds. No organomegaly. Extremities: No edema Psychiatric: alert and oriented x3. Cooperative   ASSESSMENT:  #1. Autoimmune hepatitis. Biopsy supported. Normal liver function tests on Imuran 150 mg daily #2. Brother with hemochromatosis. The patient is heterozygous for 2 separate mutations with no evidence for iron storage disease on biopsy #3. Previous evaluation elsewhere for elevated liver tests including liver biopsy demonstrating fatty liver without fibrosis #4. Father with a history of cirrhosis. Etiology uncertain. Significant alcohol history reported #5. Multiple medical problems #6. History of non-adenomatous colon polyp 2011 #7. Functional constipation  PLAN:  #1. Continue Imuran 150 mg daily #2. Repeat liver tests next month and as directed thereafter #3. MiraLax as needed for constipation #4. Routine colonoscopy 2021 #5. Routine GI followup in 6 months. Sooner if needed

## 2013-12-13 NOTE — Patient Instructions (Signed)
Please follow up with Dr. Perry in 6 months 

## 2013-12-16 ENCOUNTER — Other Ambulatory Visit: Payer: Self-pay | Admitting: Internal Medicine

## 2013-12-17 ENCOUNTER — Ambulatory Visit
Admission: RE | Admit: 2013-12-17 | Discharge: 2013-12-17 | Disposition: A | Discharge status: Home / Self Care | Payer: BC Managed Care – PPO | Source: Ambulatory Visit | Attending: Neurology | Admitting: Neurology

## 2013-12-17 VITALS — BP 123/86 | HR 90

## 2013-12-17 DIAGNOSIS — M47817 Spondylosis without myelopathy or radiculopathy, lumbosacral region: Secondary | ICD-10-CM

## 2013-12-17 MED ORDER — METHYLPREDNISOLONE ACETATE 40 MG/ML INJ SUSP (RADIOLOG
120.0000 mg | Freq: Once | INTRAMUSCULAR | Status: AC
  Administered 2013-12-17: 120 mg via EPIDURAL

## 2013-12-17 MED ORDER — IOHEXOL 180 MG/ML  SOLN
1.0000 mL | Freq: Once | INTRAMUSCULAR | Status: AC | PRN
  Administered 2013-12-17: 1 mL via EPIDURAL

## 2013-12-17 NOTE — Discharge Instructions (Signed)

## 2013-12-17 NOTE — Telephone Encounter (Signed)
Message copied by Larina Bras on Fri Dec 17, 2013  1:21 PM ------      Message from: Irene Shipper      Created: Fri Dec 17, 2013 12:09 PM       Ok to refill. Thanks       ----- Message -----         From: Audrea Muscat, CMA         Sent: 12/16/2013   4:15 PM           To: Irene Shipper, MD            Patient requesting a refill of Zofran.  Was last filled in 05/2013.  When she was seen in this office earlier this week, the note does not indicate that nausea or need for Zofran was discussed.  Ok to refill?       ------

## 2013-12-17 NOTE — Telephone Encounter (Signed)
Rx sent 

## 2013-12-30 ENCOUNTER — Telehealth: Payer: Self-pay | Admitting: Neurology

## 2013-12-30 DIAGNOSIS — M47817 Spondylosis without myelopathy or radiculopathy, lumbosacral region: Secondary | ICD-10-CM

## 2013-12-30 MED ORDER — CYCLOBENZAPRINE HCL 5 MG PO TABS: 5.0000 mg | ORAL_TABLET | Freq: Three times a day (TID) | ORAL | Status: DC

## 2013-12-30 NOTE — Telephone Encounter (Signed)
I called patient. The patient had epidural steroid injection which helped the pain in the leg, but now she has pain in the spine that is worse as the day goes on. This may be neuromuscular, spasm related. The patient will be placed on Flexeril, and set up for neuromuscular therapy.

## 2013-12-30 NOTE — Telephone Encounter (Signed)
Spoke with patient and she said that the pain is in her lower spine, can't stand, wash dishes. She feels as though her bone is scraping, is taking ibuprofren 3 tablets twice daily, helps but does not relieve completely,by the end of day she can hardly get around, can't lift legs now.  Pain has gotton worse since the shot.

## 2013-12-30 NOTE — Telephone Encounter (Signed)
Pt called about steroid shot and pt states pain is worse than it was before, pt would like for Dr. Jannifer Franklin or his nurse to call her back concerning this matter. Thanks

## 2014-01-03 ENCOUNTER — Telehealth: Payer: Self-pay | Admitting: Internal Medicine

## 2014-01-03 ENCOUNTER — Telehealth: Payer: Self-pay

## 2014-01-03 NOTE — Telephone Encounter (Signed)
Message copied by Algernon Huxley on Mon Jan 03, 2014  1:36 PM ------      Message from: HUNT, Virginia R      Created: Wed Oct 06, 2013  1:58 PM      Regarding: LFT's       Pt needs labs in 3 mth, order in epic. ------

## 2014-01-03 NOTE — Telephone Encounter (Signed)
Unable to reach pt by phone. Letter mailed to pt reminding her to come for labs.

## 2014-01-04 ENCOUNTER — Other Ambulatory Visit: Payer: Self-pay | Admitting: Neurology

## 2014-01-04 NOTE — Telephone Encounter (Signed)
Rx signed and faxed.

## 2014-01-05 ENCOUNTER — Telehealth: Payer: Self-pay

## 2014-01-05 NOTE — Telephone Encounter (Signed)
Message copied by Audrea Muscat on Wed Jan 05, 2014  1:14 PM ------      Message from: Irene Shipper      Created: Wed Jan 05, 2014 10:56 AM       It should be OK      ----- Message -----         From: Audrea Muscat, CMA         Sent: 01/05/2014  10:42 AM           To: Irene Shipper, MD            Patient is questioning whether she can take the muscle relaxant Cyclonbenzaprine with her diagnosis of autoimmune hepatitis.  Please advise       ------

## 2014-01-05 NOTE — Telephone Encounter (Signed)
Spoke to pt and relayed to her that Dr. Henrene Pastor said it was ok to take the muscle relaxant.  She acknowledged and understood.  She then told me she was due for lab work but had just had some done at [place].  She is going to try to get them faxed to our office

## 2014-01-05 NOTE — Telephone Encounter (Signed)
Sent message to Dr. Henrene Pastor relaying pt's question regarding whether she can take Cyclobenzaprine.  Called pt and left a message for her to call me back

## 2014-01-11 ENCOUNTER — Telehealth: Payer: Self-pay | Admitting: *Deleted

## 2014-01-11 NOTE — Telephone Encounter (Signed)
Patient scheduled for 4/21 with Faustino Congress.

## 2014-01-18 ENCOUNTER — Ambulatory Visit: Payer: BC Managed Care – PPO | Attending: Neurology | Admitting: Physical Therapy

## 2014-01-18 DIAGNOSIS — M6281 Muscle weakness (generalized): Secondary | ICD-10-CM | POA: Insufficient documentation

## 2014-01-18 DIAGNOSIS — IMO0001 Reserved for inherently not codable concepts without codable children: Secondary | ICD-10-CM | POA: Insufficient documentation

## 2014-01-18 DIAGNOSIS — IMO0002 Reserved for concepts with insufficient information to code with codable children: Secondary | ICD-10-CM | POA: Insufficient documentation

## 2014-01-22 ENCOUNTER — Other Ambulatory Visit: Payer: Self-pay | Admitting: Internal Medicine

## 2014-01-28 NOTE — Telephone Encounter (Signed)
See note dated 01/05/2014

## 2014-02-02 ENCOUNTER — Ambulatory Visit (HOSPITAL_COMMUNITY)
Admission: RE | Admit: 2014-02-02 | Discharge: 2014-02-02 | Disposition: A | Discharge status: Home / Self Care | Payer: BC Managed Care – PPO | Source: Ambulatory Visit | Attending: Neurology | Admitting: Neurology

## 2014-02-02 DIAGNOSIS — M545 Low back pain, unspecified: Secondary | ICD-10-CM | POA: Insufficient documentation

## 2014-02-02 DIAGNOSIS — E119 Type 2 diabetes mellitus without complications: Secondary | ICD-10-CM | POA: Insufficient documentation

## 2014-02-02 DIAGNOSIS — R262 Difficulty in walking, not elsewhere classified: Secondary | ICD-10-CM | POA: Insufficient documentation

## 2014-02-02 DIAGNOSIS — M6281 Muscle weakness (generalized): Secondary | ICD-10-CM | POA: Insufficient documentation

## 2014-02-02 DIAGNOSIS — IMO0001 Reserved for inherently not codable concepts without codable children: Secondary | ICD-10-CM | POA: Insufficient documentation

## 2014-02-02 NOTE — Evaluation (Addendum)
Physical Therapy Evaluation  Patient Details  Name: Gabriela Rice MRN: [id number] Date of Birth: 1955-12-15  Today's Date: 02/02/2014 Time: 1020-1100 PT Time Calculation (min): 40 min    Charges: 1 eval, 1055-1100 Manual therapy          Visit#: 1 of 20  Re-eval:  03/04/14 Assessment Diagnosis: difficulty walkign secondary to Lumbago, generalized weakness, and limited hip knee and lumbar spine mobility.  Next MD Visit: Gabriela Rice, August.   Authorization: [employer]    Authorization Time Period:    Authorization Visit#: 1 of 55   Past Medical History:  Past Medical History  Diagnosis Date  . DM (diabetes mellitus)   . Hyperlipidemia   . Multiple sclerosis   . Allergic rhinitis   . Elevated LFTs   . Obesity   . Vitamin D deficiency   . Abnormality of gait 02/16/2013  . Colon polyps   . Diverticulosis   . Hemorrhoids   . Hepatitis     with cholestasis  . Hemorrhoids   . Autoimmune hepatitis    Past Surgical History:  Past Surgical History  Procedure Laterality Date  . Myoectomy    . Cesarean section      x 2    Subjective Symptoms/Limitations Symptoms: Patient has low back pain, recent steroid injection decreased LE pain, but not back pain. Pain alon Lteral Rt thight Pertinent History: Gabriela Rice is a 58 year old right-handed white female with a history of multiple sclerosis with an associated gait disorder. Patient has autoimmune hepatitis. Within the last month or so, the patient has had a significant worsening of back pain that is mainly affecting the right leg, but it will affect the left leg to some degree. The patient has pain in the back, going into the right hip, and down the right thigh to the knee. The patient reports that there is pain whether she is sitting, standing, or lying down. The patient notes that the pain will worsen if she bends or stoops over. The patient feels as if the right leg is somewhat weak. The patient uses a cane for ambulation, problems with  constipation recently.  Limitations: Sitting;Standing;Walking How long can you sit comfortably?: <26min, needs a comfotrtable chair with lumbar support How long can you stand comfortably?: 54minutes How long can you walk comfortably?: 69min Repetition: Increases Symptoms Patient Stated Goals: to be able to walk better and balance decrease risk of falls.  Pain Assessment Currently in Pain?: Yes Pain Score: 8  (cleaning house) Pain Location: Back Pain Orientation: Right;Left Pain Type: Chronic pain Pain Radiating Towards: Radiates  Pain Onset: More than a month ago Pain Frequency: Intermittent Pain Relieving Factors: pillow behind back Effect of Pain on Daily Activities: unab;e to perform normal household cleaning, cooking and ADL's withotu pain. Walking.  Sensation/Coordination/Flexibility/Functional Tests Flexibility Thomas: Positive 90/90: Positive (Rt: low back pain at 90 degrees, Lt hamstring tightness 70) Functional Tests Functional Tests: 3D hip excursion: limited extensio/flexion due to pain.  Functional Tests: Gait: excessive knee valgus moment, limited hip extension. SPC in right hand due to right handedness Functional Tests: limited piriformis mobility  Assessment RLE AROM (degrees) Right Hip Extension: 5 Right Hip Flexion: 90 RLE Strength Right Hip Flexion: 2+/5 Right Hip ABduction: 3/5 Right Knee Flexion: 2/5 Right Knee Extension: 5/5 Right Ankle Dorsiflexion: 3+/5 Right Ankle Plantar Flexion: 2+/5 Lumbar AROM Lumbar Flexion: 60% limited Lumbar Extension: 25% limited Lumbar Strength Lumbar Flexion: 2+/5 Lumbar Extension: 2+/5  Exercise/Treatments Stretches Prone on Elbows Stretch: 3 reps;20 seconds  Press Ups: 1 rep;10 seconds  Manual Therapy Manual Therapy: Joint mobilization Joint Mobilization: L1-L5 joint mobilization grade 1-2. patient was particularly limited at L3 and L4, follwoign 15min of joint mobilizations at each patient noticed decreased pain  and improved extension  Physical Therapy Assessment and Plan PT Assessment and Plan Clinical Impression Statement: Patient is a pleasant 58 y/o lady who presents with Low back pain with radiating symptoms into bilateral LE. Patient's symptoms are worsened/exacerbated with forward bending and centralized with extension. Patient's lumbar spine appears particularly limited at L3 and L4 , following posterior to anterior joint mobilizations low back pain improved.  Patient will benefit from contineusd phsycial therapy to addess the above listed impairments as well as other contriburting factors including bilateral LE weakness (hip extension and abduction in particular), decreased balance, limited pirformis mobility, and limited hamstring mobility. Expecti patient to require at least 8 weeks of physical therapy to return top prior level of fucntion and independence.  Pt will benefit from skilled therapeutic intervention in order to improve on the following deficits: Abnormal gait;Decreased activity tolerance;Decreased balance;Decreased range of motion;Decreased mobility;Decreased strength;Difficulty walking;Increased fascial restricitons;Impaired flexibility;Pain Rehab Potential: Fair Clinical Impairments Affecting Rehab Potential: Chronicity and seveity of pain and MS,  though patient is highly motivated PT Frequency: Min 2X/week PT Duration: 8 weeks PT Treatment/Interventions: Gait training;Stair training;Functional mobility training;Therapeutic activities;Therapeutic exercise;Balance training;Patient/family education;Manual techniques;Modalities PT Plan: Patient to benefit from skilled physical therapy 3x a week for 4 weeks and progress to 2x a week for the following 4 weeks. Initial focus on improving flexibility and spinal stabilization.     Goals Home Exercise Program Pt/caregiver will Perform Home Exercise Program: For increased ROM;For increased strengthening PT Goal: Perform Home Exercise Program  - Progress: Goal set today PT Short Term Goals Time to Complete Short Term Goals: 4 weeks PT Short Term Goal 1: Patient will be able to sit in car >75minutes without pain increasing to >4/10 so she can drive short to moderate distances PT Short Term Goal 2: Patient will be able to Stand >66minutes without pain increasing >3/10 so patient can cook dinner  PT Short Term Goal 3: Patient will be able to demostrate full lumbar extension ROM without pain so patient can find position of comfort when sittign and stanidng PT Short Term Goal 4: Patient will be able to Walk 15 minutes without pain increasing to greater than 4/10 so patient can perform short shopping errands PT Long Term Goals Time to Complete Long Term Goals: 8 weeks PT Long Term Goal 1: Patient will be able to sit in car >115minutes without pain increasing to >2/10 so she can drive moderate to long distances PT Long Term Goal 2: Patient will be able to Stand >61minutes without pain increasing >2/10 so patient perform dusting chores in home Long Term Goal 3: Patient will be able to ambulate 56minutes to perform grocery shopping without pain.  Long Term Goal 4: Patient will demsontrate improved hip abduction/extensionstrength to 4+/5 so patient may perform sit to stand to chair without using her hands and single leg stand for 5 seconds on each leg.  PT Long Term Goal 5: Patient will demostrate improve core strength stability to 4/5 with maual muscle testing so patient can control forward flexion and extension to while standing Additional PT Long Term Goals?: Yes PT Long Term Goal 6: patient will be able to lift 5lb from floor to chest high shelf  Problem List Patient Active Problem List   Diagnosis Date Noted  .  Lumbago 02/02/2014  . Difficulty in walking(719.7) 02/02/2014  . HBP (high blood pressure) 07/11/2013  . Chronic rhinitis 04/08/2013  . Abnormality of gait 02/16/2013  . Elevated liver function tests 02/12/2013  . Abdominal  pain, right upper quadrant 02/12/2013  . Nausea alone 02/12/2013  . Dyspnea 02/04/2013  . Hypoxemia, nocturnal only  02/04/2013  . Encounter for long-term (current) use of other medications 10/14/2012  . Multiple sclerosis 10/14/2012  . IRRITABLE BOWEL SYNDROME 03/29/2009  . FATTY LIVER DISEASE 03/29/2009  . Elev transaminase/LDH 03/29/2009  . PERSONAL HX COLONIC POLYPS 03/29/2009    PT - End of Session Activity Tolerance: Patient tolerated treatment well;Patient limited by pain General Behavior During Therapy: WFL for tasks assessed/performed PT Plan of Care PT Home Exercise Plan: Prone pres up.  PT Patient Instructions: twice daily.  Consulted and Agree with Plan of Care: Patient  GP    Leia Alf 02/02/2014, 12:53 PM  Physician Documentation Your signature is required to indicate approval of the treatment plan as stated above.  Please sign and either send electronically or make a copy of this report for your files and return this physician signed original.   Please mark one 1.__approve of plan  2. ___approve of plan with the following conditions.   ______________________________                                                          _____________________ Physician Signature                                                                                                             Date

## 2014-02-09 ENCOUNTER — Ambulatory Visit (HOSPITAL_COMMUNITY)
Admission: RE | Admit: 2014-02-09 | Discharge: 2014-02-09 | Disposition: A | Discharge status: Home / Self Care | Payer: BC Managed Care – PPO | Source: Ambulatory Visit | Attending: Neurology | Admitting: Neurology

## 2014-02-09 NOTE — Progress Notes (Signed)
Physical Therapy Treatment Patient Details  Name: Gabriela Rice MRN: [id number] Date of Birth: 11-Jan-1956  Today's Date: 02/09/2014 Time: 1019-1052 PT Time Calculation (min): 33 min Charges: Therex x 30'  Visit#: 2 of 20  Re-eval: 03/02/14  Authorization: [employer]  Authorization Visit#: 2 of 29   Subjective: Symptoms/Limitations Symptoms: Pt states taht she was sore yesterday but pain has decreased today. Pain Assessment Currently in Pain?: Yes Pain Score: 5  Pain Location: Back Pain Orientation: Right;Left   Exercise/Treatments Stretches Passive Hamstring Stretch: 3 reps;30 seconds;Limitations Passive Hamstring Stretch Limitations: manual Single Knee to Chest Stretch: 3 reps;30 seconds Press Ups: 3 reps;10 seconds Supine Ab Set: 10 reps Clam: 10 reps Bent Knee Raise: 10 reps Sidelying Hip Abduction: 10 reps Prone  Other Prone Lumbar Exercises: POE x 3' with hamstring curl x 10B  Physical Therapy Assessment and Plan PT Assessment and Plan Clinical Impression Statement: Pt displays significant weakness in core and BLE. Pt requires multimodal cueing to improve stabilization and form with therex. Progressed stabilization exercises to improve core stability and decrease pain. Pt appears to tolerates session well. Pt will benefit from skilled therapeutic intervention in order to improve on the following deficits: Abnormal gait;Decreased activity tolerance;Decreased balance;Decreased range of motion;Decreased mobility;Decreased strength;Difficulty walking;Increased fascial restricitons;Impaired flexibility;Pain Rehab Potential: Fair PT Frequency: Min 2X/week PT Duration: 8 weeks PT Treatment/Interventions: Gait training;Stair training;Functional mobility training;Therapeutic activities;Therapeutic exercise;Balance training;Patient/family education;Manual techniques;Modalities PT Plan: Continue to progress flexibility and spinal stabilization per PT POC.     Problem  List Patient Active Problem List   Diagnosis Date Noted  . Lumbago 02/02/2014  . Difficulty in walking(719.7) 02/02/2014  . HBP (high blood pressure) 07/11/2013  . Chronic rhinitis 04/08/2013  . Abnormality of gait 02/16/2013  . Elevated liver function tests 02/12/2013  . Abdominal pain, right upper quadrant 02/12/2013  . Nausea alone 02/12/2013  . Dyspnea 02/04/2013  . Hypoxemia, nocturnal only  02/04/2013  . Encounter for long-term (current) use of other medications 10/14/2012  . Multiple sclerosis 10/14/2012  . IRRITABLE BOWEL SYNDROME 03/29/2009  . FATTY LIVER DISEASE 03/29/2009  . Elev transaminase/LDH 03/29/2009  . PERSONAL HX COLONIC POLYPS 03/29/2009    PT - End of Session Activity Tolerance: Patient tolerated treatment well;Patient limited by pain General Behavior During Therapy: [facility] for tasks assessed/performed  Rachelle Hora, PTA  02/09/2014, 12:13 PM

## 2014-02-10 ENCOUNTER — Other Ambulatory Visit: Payer: Self-pay | Admitting: Internal Medicine

## 2014-02-10 ENCOUNTER — Ambulatory Visit (HOSPITAL_COMMUNITY)
Admission: RE | Admit: 2014-02-10 | Discharge: 2014-02-10 | Disposition: A | Discharge status: Home / Self Care | Payer: BC Managed Care – PPO | Source: Ambulatory Visit | Attending: Neurology | Admitting: Neurology

## 2014-02-10 ENCOUNTER — Ambulatory Visit (HOSPITAL_COMMUNITY): Payer: BC Managed Care – PPO

## 2014-02-10 NOTE — Progress Notes (Signed)
Physical Therapy Treatment Patient Details  Name: Gabriela Rice MRN: [id number] Date of Birth: [birth year]  Today's Date: 02/10/2014 Time: 1100-1145 PT Time Calculation (min): 45 min Charge:  There ex 1100-1145 Visit#: 3 of 20  Re-eval: 03/02/14    Authorization: [employer]  Authorization Visit#: 3 of 20   Subjective: Symptoms/Limitations Symptoms: Pt states she is not as sore as she was.  This morning was better as far as pain. Pain Assessment Pain Score: 1  (lying on table )  Exercise/Treatments  Stretches Active Hamstring Stretch: 3 reps;30 seconds Single Knee to Chest Stretch: 3 reps;30 seconds   Supine Ab Set: 5 reps Glut Set: 10 reps Clam: 10 reps Bent Knee Raise: 10 reps Other Supine Lumbar Exercises: isometric adduction x 10  Sidelying Clam: 5 reps Hip Abduction: 5 reps Prone  Other Prone Lumbar Exercises: heel squeeze x 10  Other Prone Lumbar Exercises: knee flexion x5      Physical Therapy Assessment and Plan PT Assessment and Plan Clinical Impression Statement: Pt has significant weak core and LE mm.  Lumbar mobiltiy appears improved. Added Adduction and prone exercises to pt program.  All exercises needed therapist facilitation and cuing to perform correctly.  Pt fatigues easily, (for most exercise this is at five reps), which will need to be watched due to pt hx of MS. PT Plan: focuse on strengtheneing without total fatigue.    Goals  will progress slowly due to extreme weakness.  Problem List Patient Active Problem List   Diagnosis Date Noted  . Lumbago 02/02/2014  . Difficulty in walking(719.7) 02/02/2014  . HBP (high blood pressure) 07/11/2013  . Chronic rhinitis 04/08/2013  . Abnormality of gait 02/16/2013  . Elevated liver function tests 02/12/2013  . Abdominal pain, right upper quadrant 02/12/2013  . Nausea alone 02/12/2013  . Dyspnea 02/04/2013  . Hypoxemia, nocturnal only  02/04/2013  . Encounter for long-term (current) use of other  medications 10/14/2012  . Multiple sclerosis 10/14/2012  . IRRITABLE BOWEL SYNDROME 03/29/2009  . FATTY LIVER DISEASE 03/29/2009  . Elev transaminase/LDH 03/29/2009  . PERSONAL HX COLONIC POLYPS 03/29/2009    PT Plan of Care PT Home Exercise Plan: given for core supine ex.  GP    Leeroy Cha 02/10/2014, 12:00 PM

## 2014-02-11 ENCOUNTER — Ambulatory Visit: Payer: BC Managed Care – PPO | Admitting: Neurology

## 2014-02-15 ENCOUNTER — Ambulatory Visit (HOSPITAL_COMMUNITY)
Admission: RE | Admit: 2014-02-15 | Discharge: 2014-02-15 | Disposition: A | Discharge status: Home / Self Care | Payer: BC Managed Care – PPO | Source: Ambulatory Visit | Attending: Neurology | Admitting: Neurology

## 2014-02-15 NOTE — Progress Notes (Signed)
Physical Therapy Treatment Patient Details  Name: Gabriela Rice MRN: [id number] Date of Birth: 1955/12/25  Today's Date: 02/15/2014 Time: 9381-0175 PT Time Calculation (min): 38 min Charges: Therex x 35'  Visit#: 4 of 20   Re-eval: 03/02/14 Authorization: [employer]  Authorization Visit#: 4 of 20   Subjective: Symptoms/Limitations Symptoms: Pt states that her pain is higher today secondary to completing her exercise on the bed rather than the floor. Pain Assessment Currently in Pain?: Yes Pain Score: 7  Pain Location: Back Pain Orientation: Right;Left  Exercise/Treatments Supine Ab Set: 10 reps Glut Set: 10 reps Clam: 10 reps Sidelying Clam: 5 reps;Limitations Clam Limitations: 10" holds Hip Abduction: 10 reps Prone  Other Prone Lumbar Exercises: heel squeeze 10x5" Other Prone Lumbar Exercises: knee flexion x10  Physical Therapy Assessment and Plan PT Assessment and Plan Clinical Impression Statement: PTA facilitated activities to improve core and LE strength/stability. Pt completes therex well after initial cueing and demo. No progression of therex this session secondary to increase pain. Pt requires multimodal cueing to avoid hamstring compensation with sidelying clams.  PT Plan: Continue to progress strengthening avoiding overexertion. Begin session with standing exercises next session.     Problem List Patient Active Problem List   Diagnosis Date Noted  . Lumbago 02/02/2014  . Difficulty in walking(719.7) 02/02/2014  . HBP (high blood pressure) 07/11/2013  . Chronic rhinitis 04/08/2013  . Abnormality of gait 02/16/2013  . Elevated liver function tests 02/12/2013  . Abdominal pain, right upper quadrant 02/12/2013  . Nausea alone 02/12/2013  . Dyspnea 02/04/2013  . Hypoxemia, nocturnal only  02/04/2013  . Encounter for long-term (current) use of other medications 10/14/2012  . Multiple sclerosis 10/14/2012  . IRRITABLE BOWEL SYNDROME 03/29/2009  . FATTY LIVER  DISEASE 03/29/2009  . Elev transaminase/LDH 03/29/2009  . PERSONAL HX COLONIC POLYPS 03/29/2009    PT - End of Session Activity Tolerance: Patient tolerated treatment well General Behavior During Therapy: [facility] for tasks assessed/performed  Rachelle Hora, PTA  02/15/2014, 6:07 PM

## 2014-02-16 ENCOUNTER — Ambulatory Visit (HOSPITAL_COMMUNITY)
Admission: RE | Admit: 2014-02-16 | Discharge: 2014-02-16 | Disposition: A | Discharge status: Home / Self Care | Payer: BC Managed Care – PPO | Source: Ambulatory Visit | Attending: Neurology | Admitting: Neurology

## 2014-02-16 NOTE — Progress Notes (Signed)
Physical Therapy Treatment Patient Details  Name: Gabriela Rice MRN: [id number] Date of Birth: 1956/09/01  Today's Date: 02/16/2014 Time: 1302-1347 PT Time Calculation (min): 45 min  Visit#: 5 of 20  Re-eval: 03/02/14 Authorization: [employer]  Authorization Visit#: 5 of 20  Charges:  therex 42  Subjective: Symptoms/Limitations Symptoms: Pt states she is having no pain today.  States last session was rough due to increased pain from doing her exercises on the bed vs the floor.   Pertinent History: Gabriela Rice is a 58 year old right-handed white female with a history of multiple sclerosis with an associated gait disorder. Patient has autoimmune hepatitis. Within the last month or so, the patient has had a significant worsening of back pain that is mainly affecting the right leg, but it will affect the left leg to some degree. The patient has pain in the back, going into the right hip, and down the right thigh to the knee. The patient reports that there is pain whether she is sitting, standing, or lying down. The patient notes that the pain will worsen if she bends or stoops over. The patient feels as if the right leg is somewhat weak. The patient uses a cane for ambulation, problems with constipation recently.  Pain Assessment Currently in Pain?: No/denies   Exercise/Treatments Stretches Active Hamstring Stretch: 3 reps;30 seconds Single Knee to Chest Stretch: 3 reps;30 seconds ITB Stretch: Limitations ITB Stretch Limitations: slant board gastroc stretch 3X30" Standing Heel Raises: 10 reps;Limitations Heel Raises Limitations: toeraises 10 reps Functional Squats: 10 reps Supine Bent Knee Raise: 10 reps Bridge: 10 reps Straight Leg Raise: 5 reps Other Supine Lumbar Exercises: isometric adduction x 10  Sidelying Clam: 5 reps;Limitations Clam Limitations: 10" holds Hip Abduction: 10 reps Prone  Other Prone Lumbar Exercises: heel squeeze 10x5" Other Prone Lumbar Exercises: knee  flexion x10   nustep 6' level 2 hills #2  LE's only  Physical Therapy Assessment and Plan PT Assessment and Plan Clinical Impression Statement: Progressed strengthening therex today in standing.  Continues to need facilitation from PTA to perform established therex in correct form.  No complaints of pain with any therex today.  Most difficulty with prone and supine SLR's due to weakness.   PT Plan: Continue to progress strengthening avoiding overexertion (Pt has MS).      Problem List Patient Active Problem List   Diagnosis Date Noted  . Lumbago 02/02/2014  . Difficulty in walking(719.7) 02/02/2014  . HBP (high blood pressure) 07/11/2013  . Chronic rhinitis 04/08/2013  . Abnormality of gait 02/16/2013  . Elevated liver function tests 02/12/2013  . Abdominal pain, right upper quadrant 02/12/2013  . Nausea alone 02/12/2013  . Dyspnea 02/04/2013  . Hypoxemia, nocturnal only  02/04/2013  . Encounter for long-term (current) use of other medications 10/14/2012  . Multiple sclerosis 10/14/2012  . IRRITABLE BOWEL SYNDROME 03/29/2009  . FATTY LIVER DISEASE 03/29/2009  . Elev transaminase/LDH 03/29/2009  . PERSONAL HX COLONIC POLYPS 03/29/2009    PT - End of Session Activity Tolerance: Patient tolerated treatment well General Behavior During Therapy: WFL for tasks assessed/performed   Teena Irani, PTA/CLT 02/16/2014, 2:01 PM

## 2014-02-18 ENCOUNTER — Ambulatory Visit (HOSPITAL_COMMUNITY)
Admission: RE | Admit: 2014-02-18 | Discharge: 2014-02-18 | Disposition: A | Discharge status: Home / Self Care | Payer: BC Managed Care – PPO | Source: Ambulatory Visit | Attending: Family Medicine | Admitting: Family Medicine

## 2014-02-18 NOTE — Progress Notes (Addendum)
Physical Therapy Treatment Patient Details  Name: Gabriela Rice MRN: [id number] Date of Birth: Jan 16, 1956  Today's Date: 02/18/2014 Time: 1300-1353 PT Time Calculation (min): 60 min Charge: Manual [id number], TE [id number]  Visit#: 6 of 20  Re-eval: 03/02/14 Assessment Diagnosis: difficulty walkign secondary to Lumbago, generalized weakness, and limited hip knee and lumbar spine mobility.  Next MD Visit: Dr. Jannifer Rice, August.   Authorization: [employer]  Authorization Time Period:    Authorization Visit#: 6 of 20   Subjective: Symptoms/Limitations Symptoms: Patient states that the pain has been much better since last session.  Pain Assessment Currently in Pain?: No/denies  Objective:   Exercise/Treatments Stretches Active Hamstring Stretch: 3 reps;20 seconds Single Knee to Chest Stretch: 3 reps;30 seconds Prone on Elbows Stretch: 3 reps;20 seconds Press Ups: 1 rep;10 seconds ITB Stretch Limitations: slant board gastroc stretch 3X30" Piriformis Stretch: 3 reps;20 seconds;Limitations Piriformis Stretch Limitations: seated 3 directions, HEP given  Aerobic Stationary Bike: nustep 6 minutes level3 hills #3 Standing Forward Lunge: 10 reps;Limitations Forward Lunge Limitations: Forward and lateral lunges on 8in box 10x Bil LE. Side Lunge: 10 reps Other Standing Lumbar Exercises: 3D hip excursion 10x Other Standing Lumbar Exercises: Standing abduction with heels against wall for proper mm activation 10x Bil LE Supine Bent Knee Raise: 10 reps Bridge: 10 reps Straight Leg Raise: 5 reps  Manual Therapy Joint Mobilization: L3,4 grade 2 posterior to anterior joint mobilization, T2-T12 grade 3 posterior to anterior joint mobilizations and rib mobilization 10seconds each to improve thoracic extension, Manual piriformis stretch. Piriformis release  Physical Therapy Assessment and Plan PT Assessment and Plan Clinical Impression Statement: Patient arrives with improved symptoms this  session noting decreased pain but contineud tightness and discomfort in Rt posterior hip. apatient displasy significant limitations in piriformis and glut max mobility that upon stretching and myofascial release resulted in decreased pain. Patient noted low back pain with prone extension during which she displayed excessive mobility at L4,L5,S1, bulut limited mobility throughout the uperior spine. Upon mobilizing thoracic spine patient had no low back pain and full ROM with prone extension. Patient demosntrated gait indicating excssive knee valgus secondary to limited glu strength for which the rest of the session focused on strengthenig. patient stated improved pain and symptoms at the end of the session.  PT Plan: Continue to progress thoracic and superior lumbar spine mobility, and glut mobility. prograess hip abduction/extension strength as able avoiding overexertion (Pt has MS).     Goals Home Exercise Program Pt/caregiver will Perform Home Exercise Program: For increased ROM;For increased strengthening PT Short Term Goals Time to Complete Short Term Goals: 4 weeks PT Short Term Goal 1: Patient will be able to sit in car >65minutes without pain increasing to >4/10 so she can drive short to moderate distances PT Short Term Goal 1 - Progress: Progressing toward goal PT Short Term Goal 2: Patient will be able to Stand >65minutes without pain increasing >3/10 so patient can cook dinner  PT Short Term Goal 2 - Progress: Progressing toward goal PT Short Term Goal 3: Patient will be able to demostrate full lumbar extension ROM without pain so patient can find position of comfort when sittign and stanidng PT Short Term Goal 3 - Progress: Progressing toward goal PT Short Term Goal 4: Patient will be able to Walk 15 minutes without pain increasing to greater than 4/10 so patient can perform short shopping errands PT Long Term Goals Time to Complete Long Term Goals: 8 weeks PT Long Term Goal 1: Patient  will be able to sit in car >163minutes without pain increasing to >2/10 so she can drive moderate to long distances PT Long Term Goal 2: Patient will be able to Stand >63minutes without pain increasing >2/10 so patient perform dusting chores in home Long Term Goal 3: Patient will be able to ambulate 67minutes to perform grocery shopping without pain.  Long Term Goal 4: Patient will demsontrate improved hip abduction/extensionstrength to 4+/5 so patient may perform sit to stand to chair without using her hands and single leg stand for 5 seconds on each leg.  PT Long Term Goal 5: Patient will demostrate improve core strength stability to 4/5 with maual muscle testing so patient can control forward flexion and extension to while standing  Problem List Patient Active Problem List   Diagnosis Date Noted  . Lumbago 02/02/2014  . Difficulty in walking(719.7) 02/02/2014  . HBP (high blood pressure) 07/11/2013  . Chronic rhinitis 04/08/2013  . Abnormality of gait 02/16/2013  . Elevated liver function tests 02/12/2013  . Abdominal pain, right upper quadrant 02/12/2013  . Nausea alone 02/12/2013  . Dyspnea 02/04/2013  . Hypoxemia, nocturnal only  02/04/2013  . Encounter for long-term (current) use of other medications 10/14/2012  . Multiple sclerosis 10/14/2012  . IRRITABLE BOWEL SYNDROME 03/29/2009  . FATTY LIVER DISEASE 03/29/2009  . Elev transaminase/LDH 03/29/2009  . PERSONAL HX COLONIC POLYPS 03/29/2009       GP   Gabriela Rice, PT DPT Gabriela Rice 02/18/2014, 2:10 PM

## 2014-02-22 ENCOUNTER — Ambulatory Visit (HOSPITAL_COMMUNITY)
Admission: RE | Admit: 2014-02-22 | Discharge: 2014-02-22 | Disposition: A | Discharge status: Home / Self Care | Payer: BC Managed Care – PPO | Source: Ambulatory Visit | Attending: Neurology | Admitting: Neurology

## 2014-02-22 NOTE — Progress Notes (Signed)
Physical Therapy Treatment Patient Details  Name: Gabriela Rice MRN: [id number] Date of Birth: 06-29-1956  Today's Date: 02/22/2014 Time: 1300-1345 PT Time Calculation (min): 45 min Charge: TE [id number]  Visit#: 7 of 20  Re-eval: 03/02/14 Assessment Diagnosis: difficulty walkign secondary to Lumbago, generalized weakness, and limited hip knee and lumbar spine mobility.  Next MD Visit: Dr. Jannifer Franklin, August.   Authorization: [employer]  Authorization Time Period:    Authorization Visit#: 7 of 20   Subjective: Symptoms/Limitations Symptoms: Pt stated she is feeling better today min pain Rt LBP/ buttocks region 3/10 Pain Assessment Currently in Pain?: Yes Pain Score: 3  Pain Location: Back Pain Orientation: Lower;Right  Objective:  Exercise/Treatments Stretches Active Hamstring Stretch: 3 reps;20 seconds Piriformis Stretch: 3 reps;20 seconds;Limitations Piriformis Stretch Limitations: seated 3 directions, HEP given  Aerobic Stationary Bike: nustep 8 minutes level3 hills #3 Standing Forward Lunge: 20 reps;Limitations Forward Lunge Limitations: Forward and lateral lunges on 8in box 20x Bil LE. Other Standing Lumbar Exercises: 3D hip excursion 15x Seated Other Seated Lumbar Exercises: 3D thoracic excursion 10reps    Physical Therapy Assessment and Plan PT Assessment and Plan Clinical Impression Statement: Session focus on improving mobility and functional strengthening.  Added thoracic excursion with therapist facilition to improve thoracic mobility especially extension.  Ended session with stretches to improve flexibilty, pt stated pain reduced to 1/10 at end of session.  Pt limited by fatigue with activtiies. PT Plan: Continue to progress thoracic and superior lumbar spine mobility, and glut mobility. prograess hip abduction/extension strength as able avoiding overexertion (Pt has MS).     Goals Home Exercise Program Pt/caregiver will Perform Home Exercise Program: For  increased ROM;For increased strengthening PT Short Term Goals Time to Complete Short Term Goals: 4 weeks PT Short Term Goal 1: Patient will be able to sit in car >24minutes without pain increasing to >4/10 so she can drive short to moderate distances PT Short Term Goal 1 - Progress: Progressing toward goal PT Short Term Goal 2: Patient will be able to Stand >74minutes without pain increasing >3/10 so patient can cook dinner  PT Short Term Goal 2 - Progress: Progressing toward goal PT Short Term Goal 3: Patient will be able to demostrate full lumbar extension ROM without pain so patient can find position of comfort when sittign and stanidng PT Short Term Goal 3 - Progress: Progressing toward goal PT Short Term Goal 4: Patient will be able to Walk 15 minutes without pain increasing to greater than 4/10 so patient can perform short shopping errands PT Long Term Goals Time to Complete Long Term Goals: 8 weeks PT Long Term Goal 1: Patient will be able to sit in car >145minutes without pain increasing to >2/10 so she can drive moderate to long distances PT Long Term Goal 2: Patient will be able to Stand >68minutes without pain increasing >2/10 so patient perform dusting chores in home Long Term Goal 3: Patient will be able to ambulate 54minutes to perform grocery shopping without pain.  Long Term Goal 4: Patient will demsontrate improved hip abduction/extensionstrength to 4+/5 so patient may perform sit to stand to chair without using her hands and single leg stand for 5 seconds on each leg.  PT Long Term Goal 5: Patient will demostrate improve core strength stability to 4/5 with maual muscle testing so patient can control forward flexion and extension to while standing  Problem List Patient Active Problem List   Diagnosis Date Noted  . Lumbago 02/02/2014  . Difficulty in  walking(719.7) 02/02/2014  . HBP (high blood pressure) 07/11/2013  . Chronic rhinitis 04/08/2013  . Abnormality of gait  02/16/2013  . Elevated liver function tests 02/12/2013  . Abdominal pain, right upper quadrant 02/12/2013  . Nausea alone 02/12/2013  . Dyspnea 02/04/2013  . Hypoxemia, nocturnal only  02/04/2013  . Encounter for long-term (current) use of other medications 10/14/2012  . Multiple sclerosis 10/14/2012  . IRRITABLE BOWEL SYNDROME 03/29/2009  . FATTY LIVER DISEASE 03/29/2009  . Elev transaminase/LDH 03/29/2009  . PERSONAL HX COLONIC POLYPS 03/29/2009    PT - End of Session Activity Tolerance: Patient tolerated treatment well General Behavior During Therapy: [facility] for tasks assessed/performed  GP    Aldona Lento 02/22/2014, 4:23 PM

## 2014-02-23 ENCOUNTER — Ambulatory Visit (HOSPITAL_COMMUNITY)
Admission: RE | Admit: 2014-02-23 | Discharge: 2014-02-23 | Disposition: A | Discharge status: Home / Self Care | Payer: BC Managed Care – PPO | Source: Ambulatory Visit | Attending: Neurology | Admitting: Neurology

## 2014-02-23 NOTE — Progress Notes (Signed)
Physical Therapy Treatment Patient Details  Name: Gabriela Rice MRN: [id number] Date of Birth: 1956-04-22  Today's Date: 02/23/2014 Time: 1015-1050 PT Time Calculation (min): 35 min Charges: Therex x 32'  Visit#: 8 of 20  Re-eval: 03/02/14  Authorization: [employer]  Authorization Visit#: 8 of 20   Subjective: Symptoms/Limitations Symptoms: Pt states that she was completely exhausted after last session. Pain Assessment Currently in Pain?: Yes Pain Score: 3  Pain Location: Back Pain Orientation: Lower;Right  Exercise/Treatments Stretches Active Hamstring Stretch: 5 reps;10 seconds;Limitations Active Hamstring Stretch Limitations: standing with 8" box 3 directions Standing Heel Raises: 10 reps;Limitations Heel Raises Limitations: toeraises 10 reps Forward Lunge: 10 reps Forward Lunge Limitations: Forward on 8in box. Other Standing Lumbar Exercises: 3D hip excursion 10x Other Standing Lumbar Exercises: Tandem stance 2x30" B Supine Clam: 10 reps Bent Knee Raise: 10 reps Bridge: 10 reps Other Supine Lumbar Exercises: isometric adduction x 10   Physical Therapy Assessment and Plan PT Assessment and Plan Clinical Impression Statement: PTA facilitated activities to improve functional strength/stability and core/postural stability. Decreased reps and exercises intensity secondary to overexertion with previous session. Pt completes therex well after initial cueing and demo. Pt displays improve LE and core control with supine stabilization activities. Pt tolerates today's session well. PT Plan: Continue to progress thoracic and superior lumbar spine mobility, and glut mobility. Progress hip abduction/extension strength as able avoiding overexertion (Pt has MS).     Problem List Patient Active Problem List   Diagnosis Date Noted  . Lumbago 02/02/2014  . Difficulty in walking(719.7) 02/02/2014  . HBP (high blood pressure) 07/11/2013  . Chronic rhinitis 04/08/2013  . Abnormality of  gait 02/16/2013  . Elevated liver function tests 02/12/2013  . Abdominal pain, right upper quadrant 02/12/2013  . Nausea alone 02/12/2013  . Dyspnea 02/04/2013  . Hypoxemia, nocturnal only  02/04/2013  . Encounter for long-term (current) use of other medications 10/14/2012  . Multiple sclerosis 10/14/2012  . IRRITABLE BOWEL SYNDROME 03/29/2009  . FATTY LIVER DISEASE 03/29/2009  . Elev transaminase/LDH 03/29/2009  . PERSONAL HX COLONIC POLYPS 03/29/2009    PT - End of Session Activity Tolerance: Patient tolerated treatment well General Behavior During Therapy: [facility] for tasks assessed/performed  Rachelle Hora, PTA 02/23/2014, 12:09 PM

## 2014-02-25 ENCOUNTER — Telehealth (HOSPITAL_COMMUNITY): Payer: Self-pay

## 2014-02-25 ENCOUNTER — Ambulatory Visit (HOSPITAL_COMMUNITY): Payer: BC Managed Care – PPO

## 2014-02-28 ENCOUNTER — Ambulatory Visit (HOSPITAL_COMMUNITY)
Admission: RE | Admit: 2014-02-28 | Discharge: 2014-02-28 | Disposition: A | Discharge status: Home / Self Care | Payer: BC Managed Care – PPO | Source: Ambulatory Visit | Attending: Family Medicine | Admitting: Family Medicine

## 2014-02-28 DIAGNOSIS — E119 Type 2 diabetes mellitus without complications: Secondary | ICD-10-CM | POA: Insufficient documentation

## 2014-02-28 DIAGNOSIS — IMO0001 Reserved for inherently not codable concepts without codable children: Secondary | ICD-10-CM | POA: Insufficient documentation

## 2014-02-28 DIAGNOSIS — M6281 Muscle weakness (generalized): Secondary | ICD-10-CM | POA: Insufficient documentation

## 2014-02-28 DIAGNOSIS — M545 Low back pain, unspecified: Secondary | ICD-10-CM | POA: Insufficient documentation

## 2014-02-28 DIAGNOSIS — R262 Difficulty in walking, not elsewhere classified: Secondary | ICD-10-CM | POA: Insufficient documentation

## 2014-02-28 NOTE — Progress Notes (Addendum)
Physical Therapy Treatment Patient Details  Name: Gabriela ARSENEAULT MRN: [id number] Date of Birth: 1956/04/19  Today's Date: 02/28/2014 Time: 1104-1150 PT Time Calculation (min): 46 min Visit#: 9 of 20  Re-eval: 03/04/14 Authorization: [employer]  Authorization Visit#: 9 of 20  Charges:  therex 44  Subjective: Symptoms/Limitations Symptoms: Pt states she has been exhausted and that's why she did not make her last session.  States her MS is flaired up a little.  Pt reports currently 1-2/10 Rt lumbar region. Pain Assessment Currently in Pain?: Yes Pain Score: 2  Pain Location: Back Pain Orientation: Right;Lower   Exercise/Treatments Stretches Active Hamstring Stretch: 5 reps;10 seconds;Limitations Active Hamstring Stretch Limitations: standing with 8" box 3 directions Aerobic Stationary Bike: nustep 8 minutes level 2 level surface Standing Heel Raises: 10 reps;Limitations Heel Raises Limitations: toeraises 10 reps Forward Lunge: 10 reps Forward Lunge Limitations: Forward on 8in box. Other Standing Lumbar Exercises: 3D hip excursion 10x Supine Clam: 10 reps Bent Knee Raise: 10 reps Bridge: 10 reps Straight Leg Raise: 5 reps Other Supine Lumbar Exercises: isometric adduction x 10  Sidelying Hip Abduction: 10 reps    Physical Therapy Assessment and Plan PT Assessment and Plan Clinical Impression Statement: PTA facilitated activities to improve functional strength/stability and core/postural stability.  Pt able to complete activities despite increased exericise intolerance.   Decreased difficulty of nustep at end of session to help decrease fatigue.   Pt displays improve LE and core control with supine stabilization activities however continues to require cues to initiate. Pt tolerates today's session well with minimal rest breaks. PT Plan: Progress hip abduction/extension strength as able avoiding overexertion (Pt has MS).   Re-evaluate X 2  visits.     Problem List Patient  Active Problem List   Diagnosis Date Noted  . Lumbago 02/02/2014  . Difficulty in walking(719.7) 02/02/2014  . HBP (high blood pressure) 07/11/2013  . Chronic rhinitis 04/08/2013  . Abnormality of gait 02/16/2013  . Elevated liver function tests 02/12/2013  . Abdominal pain, right upper quadrant 02/12/2013  . Nausea alone 02/12/2013  . Dyspnea 02/04/2013  . Hypoxemia, nocturnal only  02/04/2013  . Encounter for long-term (current) use of other medications 10/14/2012  . Multiple sclerosis 10/14/2012  . IRRITABLE BOWEL SYNDROME 03/29/2009  . FATTY LIVER DISEASE 03/29/2009  . Elev transaminase/LDH 03/29/2009  . PERSONAL HX COLONIC POLYPS 03/29/2009    PT - End of Session Activity Tolerance: Patient tolerated treatment well General Behavior During Therapy: [facility] for tasks assessed/performed  GP    Teena Irani, PTA/CLT 02/28/2014, 11:46 AM

## 2014-03-02 ENCOUNTER — Ambulatory Visit (HOSPITAL_COMMUNITY)
Admission: RE | Admit: 2014-03-02 | Discharge: 2014-03-02 | Disposition: A | Discharge status: Home / Self Care | Payer: BC Managed Care – PPO | Source: Ambulatory Visit | Attending: Family Medicine | Admitting: Family Medicine

## 2014-03-02 NOTE — Progress Notes (Signed)
Physical Therapy Treatment Patient Details  Name: Gabriela Rice MRN: [id number] Date of Birth: 06-15-1956  Today's Date: 03/02/2014 Time: 1102-1140 PT Time Calculation (min): 38 min  Visit#: 10 of 20  Re-eval: 03/02/14 Authorization: [employer]  Authorization Visit#: 10 of 20  Charges:  therex 38  Subjective: Symptoms/Limitations Symptoms: Pt states she had severe lumbar pain yesterday.  States she felt good the day after therapy.  Pt wonders if any connection to doing nustep since symptoms have only increased since using.   Pain Assessment Currently in Pain?: Yes Pain Score: 2  Pain Location: Back Pain Orientation: Right;Lower   Exercise/Treatments Stretches Active Hamstring Stretch: 5 reps;10 seconds;Limitations Active Hamstring Stretch Limitations: standing with 8" box 3 directions ITB Stretch Limitations: slant board gastroc stretch 3X30" Piriformis Stretch: 3 reps;20 seconds;Limitations Aerobic NuStep: held today Standing Heel Raises: 15 reps Heel Raises Limitations: toeraises 15 reps Forward Lunge: 15 reps Forward Lunge Limitations: Forward on 8in box. Other Standing Lumbar Exercises: 3D hip excursion 10x Other Standing Lumbar Exercises: tandem gait, retro gait 1RT each Supine Clam: 15 reps Bent Knee Raise: 15 reps Bridge: 15 reps Straight Leg Raise: 5 reps Isometric Hip Flexion: 5 seconds Other Supine Lumbar Exercises: isometric adduction x 15  Sidelying Hip Abduction: 10 reps    Physical Therapy Assessment and Plan PT Assessment and Plan Clinical Impression Statement: PTA facilitated activities to improve functional strength/stability and core/postural stability. Able to increase most of the mat exercises, however SLR continues to be most difficult for patient.  Added gastroc stretch with slant board with good results.  Began balance activities today.  Held nustep to see if causing increased lumbar discomfort.   Able to complete all exercises without  complaint. PT Plan: Progress hip abduction/extension strength as able avoiding overexertion (Pt has MS).   Re-evaluate next visit.     Problem List Patient Active Problem List   Diagnosis Date Noted  . Lumbago 02/02/2014  . Difficulty in walking(719.7) 02/02/2014  . HBP (high blood pressure) 07/11/2013  . Chronic rhinitis 04/08/2013  . Abnormality of gait 02/16/2013  . Elevated liver function tests 02/12/2013  . Abdominal pain, right upper quadrant 02/12/2013  . Nausea alone 02/12/2013  . Dyspnea 02/04/2013  . Hypoxemia, nocturnal only  02/04/2013  . Encounter for long-term (current) use of other medications 10/14/2012  . Multiple sclerosis 10/14/2012  . IRRITABLE BOWEL SYNDROME 03/29/2009  . FATTY LIVER DISEASE 03/29/2009  . Elev transaminase/LDH 03/29/2009  . PERSONAL HX COLONIC POLYPS 03/29/2009    PT - End of Session Activity Tolerance: Patient tolerated treatment well General Behavior During Therapy: [facility] for tasks assessed/performed  Teena Irani, PTA/CLT 03/02/2014, 11:35 AM

## 2014-03-04 ENCOUNTER — Ambulatory Visit (HOSPITAL_COMMUNITY): Payer: BC Managed Care – PPO | Admitting: Physical Therapy

## 2014-03-08 ENCOUNTER — Ambulatory Visit (HOSPITAL_COMMUNITY)
Admission: RE | Admit: 2014-03-08 | Discharge: 2014-03-08 | Disposition: A | Discharge status: Home / Self Care | Payer: BC Managed Care – PPO | Source: Ambulatory Visit | Attending: Family Medicine | Admitting: Family Medicine

## 2014-03-08 NOTE — Evaluation (Signed)
Physical Therapy Reassessment  Patient Details  Name: Gabriela Rice MRN: [id number] Date of Birth: [birth year]  Today's Date: 03/08/2014 Time: 1345-1430 PT Time Calculation (min): 45 min     TherEx 1345-1420, Manual 1420-1430         Visit#: 11 of 20  Re-eval: 03/02/14 Assessment Diagnosis: difficulty walkign secondary to Lumbago, generalized weakness, and limited hip knee and lumbar spine mobility.  Next MD Visit: Dr. Jannifer Franklin, August.   Authorization: BCBS    Authorization Time Period:    Authorization Visit#: 55 of 20   Past Medical History:  Past Medical History  Diagnosis Date  . DM (diabetes mellitus)   . Hyperlipidemia   . Multiple sclerosis   . Allergic rhinitis   . Elevated LFTs   . Obesity   . Vitamin D deficiency   . Abnormality of gait 02/16/2013  . Colon polyps   . Diverticulosis   . Hemorrhoids   . Hepatitis     with cholestasis  . Hemorrhoids   . Autoimmune hepatitis    Past Surgical History:  Past Surgical History  Procedure Laterality Date  . Myoectomy    . Cesarean section      x 2    Subjective Symptoms/Limitations Symptoms: Patient states that pain this week has been improved.  Sensation/Coordination/Flexibility/Functional Tests Functional Tests Functional Tests: Improved pirformis mobility  Assessment RLE AROM (degrees) Right Hip Extension: 10 Right Hip Flexion: 120 RLE Strength Right Hip Flexion: 2+/5 (limited by pain) Right Hip ABduction: 3/5 Right Knee Flexion: 3+/5 Right Knee Extension: 5/5 Right Ankle Dorsiflexion: 4/5 Right Ankle Plantar Flexion: 2+/5 Lumbar AROM Lumbar Flexion: WNL (minor end range pain) Lumbar Extension: 10% limited (pain at end range) Lumbar Strength Lumbar Flexion: 2+/5 Lumbar Extension: 2+/5  Exercise/Treatments Stretches Piriformis Stretch: 4 reps;20 seconds Standing Forward Lunge: 15 reps Forward Lunge Limitations: Forward on 8in box. styatic, no UE support Other Standing Lumbar Exercises: 3D  hip excursion 10x Other Standing Lumbar Exercises: 2 way Squat chop with yellow ball. 10x each way Seated Other Seated Lumbar Exercises: 3D thoracic excursion 10reps Supine Ab Set: 10 reps Bent Knee Raise: 15 reps Quadruped Madcat/Old Horse: 10 reps Single Arm Raise: 10 reps;Right;Left Straight Leg Raise: 5 reps  Physical Therapy Assessment and Plan PT Assessment and Plan Clinical Impression Statement: Patient is making slow but steady progress towards all goals. Patient has significantly improved ROM as patient can now touch toes in standing with legs straight without pain and is only 10% limited in lumbar extension with 1/10 pain at end range. Patient does however continue to have pain with functional activities including driving, prolonged sitting, walking, and standing which is attributed to limited hip and trunk stability as patient continues to display weak hip and trunk flexors an extensors.  Patient will continue to benefit from skilled physical therapy to with focus on improving hip and trunk stability.  PT Plan: Progress hip abduction/extension strength as able avoiding overexertion (Pt has MS).   Therapy focus to shift to increasing hip, and trunk stability to decrease pain with prolonged activities. .     Goals PT Short Term Goals PT Short Term Goal 1: Patient will be able to sit in car >63mnutes without pain increasing to >4/10 so she can drive short to moderate distances PT Short Term Goal 1 - Progress: Progressing toward goal PT Short Term Goal 2: Patient will be able to Stand >368mutes without pain increasing >3/10 so patient can cook dinner  PT Short Term Goal 2 -  Progress: Progressing toward goal PT Short Term Goal 3: Patient will be able to demostrate full lumbar extension ROM without pain so patient can find position of comfort when sittign and stanidng PT Short Term Goal 3 - Progress: Partly met PT Short Term Goal 4: Patient will be able to Walk 15 minutes without pain  increasing to greater than 4/10 so patient can perform short shopping errands PT Short Term Goal 4 - Progress: Met PT Long Term Goals PT Long Term Goal 1: Patient will be able to sit in car >181mnutes without pain increasing to >2/10 so she can drive moderate to long distances PT Long Term Goal 1 - Progress: Progressing toward goal PT Long Term Goal 2: Patient will be able to Stand >654mutes without pain increasing >2/10 so patient perform dusting chores in home PT Long Term Goal 2 - Progress: Progressing toward goal Long Term Goal 3: Patient will be able to ambulate 3030mtes to perform grocery shopping without pain.  Long Term Goal 3 Progress: Met Long Term Goal 4: Patient will demsontrate improved hip abduction/extensionstrength to 4+/5 so patient may perform sit to stand to chair without using her hands and single leg stand for 5 seconds on each leg.  Long Term Goal 4 Progress: Progressing toward goal PT Long Term Goal 5: Patient will demostrate improve core strength stability to 4/5 with maual muscle testing so patient can control forward flexion and extension to while standing Long Term Goal 5 Progress: Progressing toward goal  Problem List Patient Active Problem List   Diagnosis Date Noted  . Lumbago 02/02/2014  . Difficulty in walking(719.7) 02/02/2014  . HBP (high blood pressure) 07/11/2013  . Chronic rhinitis 04/08/2013  . Abnormality of gait 02/16/2013  . Elevated liver function tests 02/12/2013  . Abdominal pain, right upper quadrant 02/12/2013  . Nausea alone 02/12/2013  . Dyspnea 02/04/2013  . Hypoxemia, nocturnal only  02/04/2013  . Encounter for long-term (current) use of other medications 10/14/2012  . Multiple sclerosis 10/14/2012  . IRRITABLE BOWEL SYNDROME 03/29/2009  . FATTY LIVER DISEASE 03/29/2009  . Elev transaminase/LDH 03/29/2009  . PERSONAL HX COLONIC POLYPS 03/29/2009    PT - End of Session Activity Tolerance: Patient tolerated treatment  well General Behavior During Therapy: [facility] tasks assessed/performed  GP    Gabriela Rice R Gabriela Rice 03/08/2014, 3:14 PM  Physician Documentation Your signature is required to indicate approval of the treatment plan as stated above.  Please sign and either send electronically or make a copy of this report for your files and return this physician signed original.   Please mark one 1.__approve of plan  2. ___approve of plan with the following conditions.   ______________________________                                                          _____________________ Physician Signature  Date  

## 2014-03-21 ENCOUNTER — Ambulatory Visit (HOSPITAL_COMMUNITY)
Admission: RE | Admit: 2014-03-21 | Discharge: 2014-03-21 | Disposition: A | Discharge status: Home / Self Care | Payer: BC Managed Care – PPO | Source: Ambulatory Visit | Attending: Family Medicine | Admitting: Family Medicine

## 2014-03-21 NOTE — Progress Notes (Signed)
Physical Therapy Treatment Patient Details  Name: Gabriela Rice MRN: [id number] Date of Birth: 24-Oct-1955  Today's Date: 03/21/2014 Time: 0930-1008 PT Time Calculation (min): 38 min Charge TE 0930-1008  Visit#: 12 of 20  Re-eval: 04/05/14 Assessment Diagnosis: difficulty walkign secondary to Lumbago, generalized weakness, and limited hip knee and lumbar spine mobility.  Next MD Visit: Dr. Jannifer Franklin, August.   Authorization: BCBS  Authorization Time Period:    Authorization Visit#: 12 of 20   Subjective: Symptoms/Limitations Symptoms: Pt reported minimal pain Rt hip 1/10, pt has been compliant with HEP as pain allows.  Pt reported stumbling yesterday following sitting outside believes due to her MS and the heat.  Stubbled in bathroom this morning, no fall Pain Assessment Currently in Pain?: Yes Pain Score: 1  Pain Location: Back Pain Orientation: Lower;Right  Objective:   Exercise/Treatments Standing Forward Lunge: 15 reps Forward Lunge Limitations: Forward on 8in box. styatic, no UE support Side Lunge: 15 reps;Limitations Side Lunge Limitations: on 2in box Other Standing Lumbar Exercises: 3D hip excursion 10x Other Standing Lumbar Exercises: hip abduction against wall 10x each Seated Other Seated Lumbar Exercises: 3D thoracic excursion 10reps Sidelying Hip Abduction: 15 reps Quadruped Madcat/Old Horse: 10 reps     Physical Therapy Assessment and Plan PT Assessment and Plan Clinical Impression Statement: Session focus on improving thoracic and hip mobility with therapist faciliation to improve mobilty especially thoracic region and trunk.  Pt limited by fatigue through session with activities, no c/o pain through session.  Pt educated on importance of not pushing to fatigue to avoid oxerexertion with MS.   PT Plan: Progress hip abduction/extension strength as able avoiding overexertion (Pt has MS).   Therapy focus to shift to increasing hip, and trunk stability to  decrease pain with prolonged activities. .     Goals PT Short Term Goals PT Short Term Goal 1: Patient will be able to sit in car >6minutes without pain increasing to >4/10 so she can drive short to moderate distances PT Short Term Goal 1 - Progress: Progressing toward goal PT Short Term Goal 2: Patient will be able to Stand >27minutes without pain increasing >3/10 so patient can cook dinner  PT Short Term Goal 2 - Progress: Progressing toward goal PT Short Term Goal 3: Patient will be able to demostrate full lumbar extension ROM without pain so patient can find position of comfort when sittign and stanidng PT Short Term Goal 3 - Progress: Progressing toward goal  Problem List Patient Active Problem List   Diagnosis Date Noted  . Lumbago 02/02/2014  . Difficulty in walking(719.7) 02/02/2014  . HBP (high blood pressure) 07/11/2013  . Chronic rhinitis 04/08/2013  . Abnormality of gait 02/16/2013  . Elevated liver function tests 02/12/2013  . Abdominal pain, right upper quadrant 02/12/2013  . Nausea alone 02/12/2013  . Dyspnea 02/04/2013  . Hypoxemia, nocturnal only  02/04/2013  . Encounter for long-term (current) use of other medications 10/14/2012  . Multiple sclerosis 10/14/2012  . IRRITABLE BOWEL SYNDROME 03/29/2009  . FATTY LIVER DISEASE 03/29/2009  . Elev transaminase/LDH 03/29/2009  . PERSONAL HX COLONIC POLYPS 03/29/2009    PT - End of Session Activity Tolerance: Patient tolerated treatment well;Patient limited by fatigue General Behavior During Therapy: [facility] for tasks assessed/performed  GP    Aldona Lento 03/21/2014, 10:13 AM

## 2014-03-23 ENCOUNTER — Ambulatory Visit (HOSPITAL_COMMUNITY): Payer: BC Managed Care – PPO | Admitting: Physical Therapy

## 2014-03-28 ENCOUNTER — Ambulatory Visit (HOSPITAL_COMMUNITY)
Admission: RE | Admit: 2014-03-28 | Discharge: 2014-03-28 | Disposition: A | Discharge status: Home / Self Care | Payer: BC Managed Care – PPO | Source: Ambulatory Visit

## 2014-03-28 NOTE — Progress Notes (Addendum)
Physical Therapy Treatment Patient Details  Name: Gabriela Rice MRN: [id number] Date of Birth: 1956/02/12  Today's Date: 03/28/2014 Time: 9509-3267 PT Time Calculation (min): 35 min Charge: Manual 1353-1410, TE 1245-8099   Visit#: 13 of 20  Re-eval: 04/05/14 Assessment Diagnosis: difficulty walkign secondary to Lumbago, generalized weakness, and limited hip knee and lumbar spine mobility.  Next MD Visit: Dr. Jannifer Franklin, August.   Authorization: BCBS  Authorization Time Period:    Authorization Visit#: 13 of 20   Subjective: Symptoms/Limitations Symptoms: Pt reported increased Rt hip pain following exercises on floor then standing up, had to cancel last session due to pain.   Pain Assessment Currently in Pain?: Yes Pain Score: 4  Pain Location: Hip Pain Orientation: Right  Objective:  Exercise/Treatments Standing Other Standing Lumbar Exercises: Gait training following MET x 5 minutes Seated Other Seated Lumbar Exercises: PFC with therapist faciliation 5x 10" Other Seated Lumbar Exercises: anterior/posterior hip rotation 2 sets 10 reps Supine Bridge: Limitations;5 reps Bridge Limitations: 3 sets x 5 reps  Manual Therapy Manual Therapy: Other (comment) Other Manual Therapy: Muscle energy technique (MET) for Lt anterior rotation and outflare f/b PFC instruction and gait training for 5 min  Physical Therapy Assessment and Plan PT Assessment and Plan Clinical Impression Statement: Pt limited by pain initially this session following report of getting off floor last week wrong with increased LBP following.  Session focus on pain control to improve gait.  Muscle energy technique complete to improve SI alignment form an Lt SI anterior rotation and outflare. Pt educated on importance of core strengthening to keep SI set in alingment with therapist faciliation for proper musculature activation.  Gait training complete at end of session to assess hip mobilty with gait and to set SI  in alignment to assist with pain control.  Pt stated pain reduced to 1/10 at end of session.   PT Plan: Check SI alignment and core specific exercises to keep in alignment Progress hip abduction/extension strength as able avoiding overexertion (Pt has MS).   Therapy focus to shift to increasing hip, and trunk stability to decrease pain with prolonged activities. .     Goals PT Short Term Goals PT Short Term Goal 1: Patient will be able to sit in car >32minutes without pain increasing to >4/10 so she can drive short to moderate distances PT Short Term Goal 1 - Progress: Progressing toward goal PT Short Term Goal 3: Patient will be able to demostrate full lumbar extension ROM without pain so patient can find position of comfort when sittign and stanidng PT Short Term Goal 3 - Progress: Progressing toward goal  Problem List Patient Active Problem List   Diagnosis Date Noted  . Lumbago 02/02/2014  . Difficulty in walking(719.7) 02/02/2014  . HBP (high blood pressure) 07/11/2013  . Chronic rhinitis 04/08/2013  . Abnormality of gait 02/16/2013  . Elevated liver function tests 02/12/2013  . Abdominal pain, right upper quadrant 02/12/2013  . Nausea alone 02/12/2013  . Dyspnea 02/04/2013  . Hypoxemia, nocturnal only  02/04/2013  . Encounter for long-term (current) use of other medications 10/14/2012  . Multiple sclerosis 10/14/2012  . IRRITABLE BOWEL SYNDROME 03/29/2009  . FATTY LIVER DISEASE 03/29/2009  . Elev transaminase/LDH 03/29/2009  . PERSONAL HX COLONIC POLYPS 03/29/2009    PT - End of Session Activity Tolerance: Patient limited by pain;Patient tolerated treatment well;Patient limited by fatigue General Behavior During Therapy: [facility] for tasks assessed/performed PT Plan of Care PT Home Exercise Plan: Pelvic floor contraction  GP    Aldona Lento 03/28/2014, 5:16 PM

## 2014-03-30 ENCOUNTER — Ambulatory Visit (HOSPITAL_COMMUNITY): Payer: BC Managed Care – PPO | Admitting: Physical Therapy

## 2014-03-31 ENCOUNTER — Telehealth: Payer: Self-pay | Admitting: Neurology

## 2014-03-31 NOTE — Telephone Encounter (Signed)
Patient requesting earlier appointment, she had an appointment ischeduled 8/27 and experiencing severe pain in both legs.  She stated becoming difficult for her to rest at night.  Please call and advise.

## 2014-04-04 ENCOUNTER — Ambulatory Visit (HOSPITAL_COMMUNITY): Payer: BC Managed Care – PPO | Admitting: Physical Therapy

## 2014-04-04 ENCOUNTER — Telehealth (HOSPITAL_COMMUNITY): Payer: Self-pay

## 2014-04-04 NOTE — Telephone Encounter (Deleted)
Called pt and pt stated that he needed a letter stating that he could work, but just not drive before his employer would let him come back to work. I scheduled the pt for a f/u with Jeani Hawking, NP per Dr. Guadelupe Sabin phone note on 03/30/14. Pt has an appt to come in on 04/05/14. Please advise

## 2014-04-04 NOTE — Telephone Encounter (Signed)
We will try to work in the patient within the next week or 2.

## 2014-04-04 NOTE — Telephone Encounter (Signed)
Pt calling requesting earlier appt, having severe leg pain in both legs. Pt has an appt on 05/26/14. Please advise

## 2014-04-06 ENCOUNTER — Ambulatory Visit (HOSPITAL_COMMUNITY): Payer: BC Managed Care – PPO

## 2014-04-08 ENCOUNTER — Ambulatory Visit (INDEPENDENT_AMBULATORY_CARE_PROVIDER_SITE_OTHER): Payer: BC Managed Care – PPO | Admitting: Neurology

## 2014-04-08 ENCOUNTER — Encounter: Payer: Self-pay | Admitting: Neurology

## 2014-04-08 ENCOUNTER — Telehealth: Payer: Self-pay | Admitting: Neurology

## 2014-04-08 VITALS — BP 121/82 | HR 101 | Wt 192.0 lb

## 2014-04-08 DIAGNOSIS — R269 Unspecified abnormalities of gait and mobility: Secondary | ICD-10-CM

## 2014-04-08 DIAGNOSIS — G35 Multiple sclerosis: Secondary | ICD-10-CM

## 2014-04-08 MED ORDER — PREDNISONE 10 MG PO TABS: ORAL_TABLET | ORAL | Status: DC

## 2014-04-08 MED ORDER — GABAPENTIN 300 MG PO CAPS: 300.0000 mg | ORAL_CAPSULE | Freq: Every day | ORAL | Status: DC

## 2014-04-08 NOTE — Telephone Encounter (Signed)
I called patient. On prednisone, her blood sugars may elevate, in the past she has had no on insulin transiently while on prednisone. The patient was concerned more about the gabapentin, but this should not be an issue with the diabetes.

## 2014-04-08 NOTE — Patient Instructions (Signed)

## 2014-04-08 NOTE — Telephone Encounter (Signed)
Please advise 

## 2014-04-08 NOTE — Telephone Encounter (Signed)
Patient calling to state that she just picked up her Prednisone script and has a question about dosage--states that the bottle says to take 6 a day, she wants to know if it's all at once or if she needs to spread it out over the day. Please return call to patient and advise.

## 2014-04-08 NOTE — Progress Notes (Signed)
Reason for visit: Multiple sclerosis  Gabriela Rice is an 58 y.o. female  History of present illness:  Gabriela Rice is a 58 year old right-handed white female with a history of multiple sclerosis. She has a history of chronic low back pain, and MRI lumbar evaluation has shown some degenerative changes in the back. She underwent an epidural steroid injection in March of 2015 without significant benefit. She has reported some right anterior lateral thigh pain, and now has some left medial leg pain below the knee. The patient has had increased discomfort within the last 2 weeks with associated fatigue and a change in walking and balance. The patient is having increasing problems with the sensation of weakness in the legs. She does have some troubles with bladder control, and ongoing constipation problems. The patient is in physical therapy, but the fatigue and weakness in the legs has prevented her from participating in this recently. She continues to have leg and back pain. The leg pain is worse in the evening hours, and does not bother her much during the day. The back pain is present at all times. She reports some numbness in the feet, and this may have increased recently as well. She denies any vision changes. She does have some neck and shoulder discomfort. She is on Copaxone, and she also takes Imuran for autoimmune hepatitis. In the past, she has been on Tecfidera.  Past Medical History  Diagnosis Date  . DM (diabetes mellitus)   . Hyperlipidemia   . Multiple sclerosis   . Allergic rhinitis   . Elevated LFTs   . Obesity   . Vitamin D deficiency   . Abnormality of gait 02/16/2013  . Colon polyps   . Diverticulosis   . Hemorrhoids   . Hepatitis     with cholestasis  . Hemorrhoids   . Autoimmune hepatitis     Past Surgical History  Procedure Laterality Date  . Myoectomy    . Cesarean section      x 2    Family History  Problem Relation Age of Onset  . Heart disease  Maternal Grandmother   . Colon cancer Maternal Aunt   . Uterine cancer Maternal Aunt   . Brain cancer Maternal Uncle   . Diabetes Mother   . Multiple sclerosis Mother     Social history:  reports that she quit smoking about 18 years ago. Her smoking use included Cigarettes. She has a 1 pack-year smoking history. She has never used smokeless tobacco. She reports that she does not drink alcohol or use illicit drugs.    Allergies  Allergen Reactions  . Penicillins Other (See Comments)    seizure as child  . Codeine Swelling    hands swell    Medications:  Current Outpatient Prescriptions on File Prior to Visit  Medication Sig Dispense Refill  . ALPRAZolam (XANAX) 1 MG tablet take 1 tablet by mouth at bedtime  90 tablet  1  . aspirin EC 81 MG tablet Take 81 mg by mouth daily.      Marland Kitchen azaTHIOprine (IMURAN) 50 MG tablet TAKE THREE TABLETS BY MOUTH ONCE DAILY  90 tablet  3  . cetirizine (ZYRTEC) 10 MG tablet Take 10 mg by mouth daily.      . Cholecalciferol (VITAMIN D) 2000 UNITS CAPS Take 2,000 Units by mouth daily.       Marland Kitchen COPAXONE 40 MG/ML SOSY Inject 40 mg into the skin 3 (three) times a week.       Marland Kitchen  cyclobenzaprine (FLEXERIL) 5 MG tablet Take 1 tablet (5 mg total) by mouth 3 (three) times daily.  90 tablet  1  . ibuprofen (ADVIL,MOTRIN) 200 MG tablet Take 200 mg by mouth every 6 (six) hours as needed for pain.      Marland Kitchen losartan (COZAAR) 100 MG tablet Take 100 mg by mouth daily.       . metFORMIN (GLUCOPHAGE) 500 MG tablet Take 500 mg by mouth 2 (two) times daily with a meal.       . ondansetron (ZOFRAN) 4 MG tablet take 1 tablet every 8 hours if needed for nausea  30 tablet  0  . ONE TOUCH ULTRA TEST test strip       . polyethylene glycol powder (GLYCOLAX/MIRALAX) powder MIX 17G IN 8 OUNCES OF WATER OR JUICE THREE TIMES DAILY  527 g  3  . pravastatin (PRAVACHOL) 20 MG tablet       . trimethoprim (TRIMPEX) 100 MG tablet Take 100 mg by mouth daily.       No current  facility-administered medications on file prior to visit.    ROS:  Out of a complete 14 system review of symptoms, the patient complains only of the following symptoms, and all other reviewed systems are negative.  Fatigue Runny nose Heat intolerance  Swollen abdomen, constipation, nausea Insomnia, frequent waking Difficulty urinating Joint pain, back pain, walking difficulties Dizziness, numbness, weakness Anxiety  Blood pressure 121/82, pulse 101, weight 192 lb (87.091 kg).  Physical Exam  General: The patient is alert and cooperative at the time of the examination. The patient is minimally to moderately obese.  Skin: No significant peripheral edema is noted.   Neurologic Exam  Mental status: The patient is oriented x 3.  Cranial nerves: Facial symmetry is present. Speech is normal, no aphasia or dysarthria is noted. Extraocular movements are full. Visual fields are full.  Motor: The patient has good strength in all 4 extremities.  Sensory examination: Soft touch sensation is symmetric on the face, arms, and legs.  Coordination: The patient has good finger-nose-finger and heel-to-shin bilaterally.  Gait and station: The patient has a slightly wide-based, unsteady gait. The patient walks with a cane. Tandem gait is unsteady. Romberg is negative. No drift is seen.  Reflexes: Deep tendon reflexes are symmetric.   MRI lumbar 12/10/13:  IMPRESSION: Abnormal MRI scan of the lumbar spine showing prominent spondylitic changes throughout most severe at L2-3 where there is severe endplate marrow degenerative changes as well as disc osteophyte protrusion resulting in left more than right foraminal narrowing.   Assessment/Plan:  One. Multiple sclerosis  2. Gait disturbance  3. Chronic low back pain  The patient is having increasing problems with her walking, increasing leg pain and back pain. The patient reports increasing fatigue. She may be having an MS  exacerbation. She will be placed on prednisone, and she will take gabapentin at nighttime for the leg discomfort. She will continue physical therapy, and MRI evaluation of the brain and cervical spine will be done to followup for the multiple sclerosis. The patient will followup in about 4 months.  Jill Alexanders MD 04/08/2014 6:41 PM  [facility] [address] [place], [place] [address]  Phone [phone] Fax [phone]

## 2014-04-08 NOTE — Telephone Encounter (Signed)
I called the patient back.  Says she understands instructions for Prednisone.  Wants a message relayed to Dr Jannifer Franklin that she has Type 2 Diabetes and would like to make sure he is okay with her taking Prednisone and Gabapentin with this condition.  Says she will be sure to watch her sugar level.  Please advise.  Thank you.

## 2014-04-11 ENCOUNTER — Inpatient Hospital Stay (HOSPITAL_COMMUNITY)
Admission: RE | Admit: 2014-04-11 | Payer: BC Managed Care – PPO | Source: Ambulatory Visit | Admitting: Physical Therapy

## 2014-04-11 ENCOUNTER — Telehealth (HOSPITAL_COMMUNITY): Payer: Self-pay

## 2014-04-13 ENCOUNTER — Inpatient Hospital Stay (HOSPITAL_COMMUNITY)
Admission: RE | Admit: 2014-04-13 | Payer: BC Managed Care – PPO | Source: Ambulatory Visit | Admitting: Physical Therapy

## 2014-04-18 ENCOUNTER — Ambulatory Visit (HOSPITAL_COMMUNITY): Payer: BC Managed Care – PPO | Admitting: Physical Therapy

## 2014-04-20 ENCOUNTER — Ambulatory Visit (HOSPITAL_COMMUNITY): Payer: BC Managed Care – PPO | Admitting: Physical Therapy

## 2014-04-25 ENCOUNTER — Ambulatory Visit (HOSPITAL_COMMUNITY): Payer: BC Managed Care – PPO

## 2014-04-27 ENCOUNTER — Ambulatory Visit (HOSPITAL_COMMUNITY): Payer: BC Managed Care – PPO | Admitting: Physical Therapy

## 2014-05-02 ENCOUNTER — Telehealth: Payer: Self-pay | Admitting: Neurology

## 2014-05-02 MED ORDER — GABAPENTIN 300 MG PO CAPS: 300.0000 mg | ORAL_CAPSULE | Freq: Every day | ORAL | Status: DC

## 2014-05-02 NOTE — Telephone Encounter (Signed)
Patient Gabriela Rice DOB 03/05/1956 calling-patient needs new Rx for Gabapentin called to [facility]--Rx was previously sent to [facility]--thank you.

## 2014-05-02 NOTE — Telephone Encounter (Signed)
Rx has been sent to [facility] mail order per patient request.

## 2014-05-11 DIAGNOSIS — G35 Multiple sclerosis: Secondary | ICD-10-CM

## 2014-05-12 ENCOUNTER — Other Ambulatory Visit: Payer: Self-pay | Admitting: Diagnostic Neuroimaging

## 2014-05-12 DIAGNOSIS — G35 Multiple sclerosis: Secondary | ICD-10-CM

## 2014-05-12 DIAGNOSIS — R269 Unspecified abnormalities of gait and mobility: Secondary | ICD-10-CM

## 2014-05-17 ENCOUNTER — Telehealth: Payer: Self-pay | Admitting: Neurology

## 2014-05-17 MED ORDER — CORTICOTROPIN 80 UNIT/ML IJ GEL: 80.0000 [IU] | Freq: Every day | INTRAMUSCULAR | Status: DC

## 2014-05-17 NOTE — Telephone Encounter (Signed)
Patient returning call to Dr. Jannifer Franklin, please call patient back at her house phone, which is [phone]. Patient cannot get a good signal with her cell phone at her house.

## 2014-05-17 NOTE — Telephone Encounter (Signed)
I called patient. MRI of the brain and cervical spinal cord do show changes consistent with multiple sclerosis. There are no new lesions however, and no enhancing lesions. The patient still could be having a minor MS exacerbation. If she is continuing to worsen with her clinical functional level, she is to contact our office.    MRI cervical spinal cord 05/12/2014:  IMPRESSION:  Abnormal MRI cervical spine (without) demonstrating: 1. Multiple chronic demyelinating plaques at the cervico-medullary junction, C2, C4-C5 and C6 levels. 2. At C5-6: disc bulging and uncovertebral joint hypertrophy with severe right and moderate left foraminal stenosis  3. At C3-4: disc bulging and uncovertebral joint hypertrophy with severe right and mild left foraminal stenosis  4. At C6-7: disc bulging and uncovertebral joint hypertrophy and facet hypertrophy with mild left foraminal stenosis  5. Compared to MRI on 05/25/12, there is no significant interval change.   MRI brain 05/12/2014:   IMPRESSION:  Abnormal MRI brain (with and without) demonstrating: 1. Multiple round and ovoid, periventricular and subcortical and juxtacortical and right cerebellar chronic demyelinating plaques.  2. No acute plaques. 3. No change from MRI on 01/27/12.

## 2014-05-17 NOTE — Telephone Encounter (Signed)
Patient returning call to Dr. Jannifer Franklin, please call patient back at her house phone, which is [phone]. Patient cannot get a good signal with her cell phone at her house.

## 2014-05-17 NOTE — Telephone Encounter (Signed)
Enrollment forms completed and faxed along with clinical info for ins to review.

## 2014-05-17 NOTE — Telephone Encounter (Signed)
I called patient. The patient indicates that she continues to do poorly, with severe fatigue, difficulty with walking. The patient has not been able to participate in physical therapy because of this. The patient had transient brief benefit with her back pain and her ability to walk on the prednisone. I will try a course of ACTH for about 10 days. We will try to get the medication through [facility].

## 2014-05-20 ENCOUNTER — Telehealth: Payer: Self-pay

## 2014-05-20 ENCOUNTER — Telehealth: Payer: Self-pay | Admitting: Neurology

## 2014-05-20 NOTE — Telephone Encounter (Signed)
All info has been re-faxed

## 2014-05-20 NOTE — Telephone Encounter (Signed)
I called back to advise patient [employer] support will be contacting her to go over all info regarding this med.  (Their number is [phone])

## 2014-05-20 NOTE — Telephone Encounter (Signed)
[facility] notified us they have approved our request for coverage on Acthar effective until 06/18/2014 Ref # [id number]

## 2014-05-20 NOTE — Telephone Encounter (Signed)
Patient is questioning how to use corticotropin (ACTHAR HP) 80 UNIT/ML injectable gel.  Please call and advise.

## 2014-05-20 NOTE — Telephone Encounter (Signed)
Gabriela Rice from the [employer] support and access program calling to state that the fax they received didn't transmit properly, and they need a new enrollment form sent over because part of the form was cut off, please return call and advise.

## 2014-05-23 ENCOUNTER — Telehealth: Payer: Self-pay | Admitting: Neurology

## 2014-05-23 NOTE — Telephone Encounter (Signed)
Spoke to [place] to see if she received PA on Acthar for the patient.  She had received the PA and also checked with [employer] and the patient should expect delivery tomorrow.  I will check with doctor if he wishes to use the sample delivered to the office first or if she should start with her prescription.  Also if the patient does use our sample we would have to get the bottle returned to office, as we cannot get another sample without the empty bottle being returned to the company.  Janett Billow also relayed that [employer] support will call patient to guide her through the process of administering medication.

## 2014-05-23 NOTE — Telephone Encounter (Signed)
The patient may come in to get the first injection here with supervision, then she can do the rest of the injections herself. I will likely need to write a prescription for the 23-gauge needles, 18-gauge needles to withdrawal the medication. The patient will need to syringes as well.

## 2014-05-24 NOTE — Telephone Encounter (Signed)
If the patient will have the medication through her insurance, okay to use this medication.

## 2014-05-24 NOTE — Telephone Encounter (Signed)
Spoke to patient and she relayed that she received the medicine today, with the 18-gauge needles and 25-gauge needles.  She also received a call saying that they would send a nurse out on Wednesday to show her how to inject the medicine.  I relayed that if they don't come, to call the office and come to see [place] for instructions.  She agreed and will not attempt to inject without instruction from a nurse.

## 2014-05-25 ENCOUNTER — Telehealth: Payer: Self-pay | Admitting: Neurology

## 2014-05-25 NOTE — Telephone Encounter (Signed)
I called patient. The ACTHAR may result in some blood pressure elevation and some blood sugar elevation, but the effects of this should be less than high dose oral steroids. I discussed this with the patient.

## 2014-05-25 NOTE — Telephone Encounter (Signed)
Patient questioning if could take corticotropin (ACTHAR HP) 80 UNIT/ML injectable gel due to her high blood pressure.  Read drug web site and it stated pts should take with high blood pressure.  Please return call after lunch today and leave message if not available.

## 2014-05-25 NOTE — Telephone Encounter (Signed)
Patient wants to know if she can use the Acthar with her High BP?

## 2014-05-26 ENCOUNTER — Telehealth: Payer: Self-pay | Admitting: *Deleted

## 2014-05-26 ENCOUNTER — Ambulatory Visit: Payer: BC Managed Care – PPO | Admitting: Neurology

## 2014-05-26 NOTE — Telephone Encounter (Signed)
Patient renewal parking placard was mail on 05/26/14.

## 2014-05-26 NOTE — Telephone Encounter (Signed)
Carla Drape, RN [facility] calling about pt and clarification on IM or SQ injection for the ACTHAR HP).  I relayed after consulting Dr. Jannifer Franklin that the injectable is very thick.  Draw up 1 ml with 18g, then warm with hands, then change needle to 25g and give IM.  (pt to receive for 10days on a daily basis).  She verbalized understanding. [phone].

## 2014-06-01 ENCOUNTER — Telehealth: Payer: Self-pay | Admitting: Neurology

## 2014-06-01 NOTE — Telephone Encounter (Signed)
I called patient. The patient has had some right foot burning that has come and gone over the last day or so. The patient has not gained much benefit with walking yet. I indicated to the patient that the Steele Sizer is different from prednisone in that he doesn't have an immediate effect, and the duration of the benefit goes on several weeks after the course of injections has stopped. If the patient continues to have pain in the right foot, she is to contact our office.

## 2014-06-01 NOTE — Telephone Encounter (Signed)
Spoke to patient and she relayed today is her 7th day of the Acthar, and has not noticed any improvement.  Yesterday is when the burning in her right foot started, and she had it again today, but yesterday was worse, and needing her walker to ambulate.  Please advise.

## 2014-06-01 NOTE — Telephone Encounter (Signed)
Patient calling to state that she believes her Acthar medication is causing her right foot to feel like it's burning, and she says that it is hard for her to walk. Please call and advise.

## 2014-06-11 ENCOUNTER — Other Ambulatory Visit: Payer: Self-pay | Admitting: Internal Medicine

## 2014-06-30 ENCOUNTER — Telehealth: Payer: Self-pay | Admitting: Neurology

## 2014-06-30 ENCOUNTER — Encounter: Payer: Self-pay | Admitting: Neurology

## 2014-06-30 NOTE — Telephone Encounter (Signed)
I called  The patient. The patient is having ongoing problems with low back pain. She has not been benefit with the are. I will get a revisit set up, we will need to address this issue, she may require a pain Center referral.

## 2014-07-01 NOTE — Telephone Encounter (Signed)
See work-in request.

## 2014-07-06 ENCOUNTER — Encounter: Payer: Self-pay | Admitting: Neurology

## 2014-07-06 ENCOUNTER — Ambulatory Visit (INDEPENDENT_AMBULATORY_CARE_PROVIDER_SITE_OTHER): Payer: BC Managed Care – PPO | Admitting: Neurology

## 2014-07-06 VITALS — BP 140/85 | HR 99 | Wt 198.2 lb

## 2014-07-06 DIAGNOSIS — M545 Low back pain, unspecified: Secondary | ICD-10-CM

## 2014-07-06 DIAGNOSIS — G35 Multiple sclerosis: Secondary | ICD-10-CM

## 2014-07-06 DIAGNOSIS — R269 Unspecified abnormalities of gait and mobility: Secondary | ICD-10-CM

## 2014-07-06 MED ORDER — GABAPENTIN 300 MG PO CAPS: 300.0000 mg | ORAL_CAPSULE | Freq: Every day | ORAL | Status: DC

## 2014-07-06 MED ORDER — GABAPENTIN 300 MG PO CAPS: 300.0000 mg | ORAL_CAPSULE | Freq: Three times a day (TID) | ORAL | Status: DC

## 2014-07-06 NOTE — Patient Instructions (Signed)
Back Pain, Adult Low back pain is very common. About 1 in 5 people have back pain.The cause of low back pain is rarely dangerous. The pain often gets better over time.About half of people with a sudden onset of back pain feel better in just 2 weeks. About 8 in 10 people feel better by 6 weeks.  CAUSES Some common causes of back pain include:  Strain of the muscles or ligaments supporting the spine.  Wear and tear (degeneration) of the spinal discs.  Arthritis.  Direct injury to the back. DIAGNOSIS Most of the time, the direct cause of low back pain is not known.However, back pain can be treated effectively even when the exact cause of the pain is unknown.Answering your caregiver's questions about your overall health and symptoms is one of the most accurate ways to make sure the cause of your pain is not dangerous. If your caregiver needs more information, he or she may order lab work or imaging tests (X-rays or MRIs).However, even if imaging tests show changes in your back, this usually does not require surgery. HOME CARE INSTRUCTIONS For many people, back pain returns.Since low back pain is rarely dangerous, it is often a condition that people can learn to manageon their own.   Remain active. It is stressful on the back to sit or stand in one place. Do not sit, drive, or stand in one place for more than 30 minutes at a time. Take short walks on level surfaces as soon as pain allows.Try to increase the length of time you walk each day.  Do not stay in bed.Resting more than 1 or 2 days can delay your recovery.  Do not avoid exercise or work.Your body is made to move.It is not dangerous to be active, even though your back may hurt.Your back will likely heal faster if you return to being active before your pain is gone.  Pay attention to your body when you bend and lift. Many people have less discomfortwhen lifting if they bend their knees, keep the load close to their bodies,and  avoid twisting. Often, the most comfortable positions are those that put less stress on your recovering back.  Find a comfortable position to sleep. Use a firm mattress and lie on your side with your knees slightly bent. If you lie on your back, put a pillow under your knees.  Only take over-the-counter or prescription medicines as directed by your caregiver. Over-the-counter medicines to reduce pain and inflammation are often the most helpful.Your caregiver may prescribe muscle relaxant drugs.These medicines help dull your pain so you can more quickly return to your normal activities and healthy exercise.  Put ice on the injured area.  Put ice in a plastic bag.  Place a towel between your skin and the bag.  Leave the ice on for 15-20 minutes, 03-04 times a day for the first 2 to 3 days. After that, ice and heat may be alternated to reduce pain and spasms.  Ask your caregiver about trying back exercises and gentle massage. This may be of some benefit.  Avoid feeling anxious or stressed.Stress increases muscle tension and can worsen back pain.It is important to recognize when you are anxious or stressed and learn ways to manage it.Exercise is a great option. SEEK MEDICAL CARE IF:  You have pain that is not relieved with rest or medicine.  You have pain that does not improve in 1 week.  You have new symptoms.  You are generally not feeling well. SEEK   IMMEDIATE MEDICAL CARE IF:   You have pain that radiates from your back into your legs.  You develop new bowel or bladder control problems.  You have unusual weakness or numbness in your arms or legs.  You develop nausea or vomiting.  You develop abdominal pain.  You feel faint. Document Released: 09/16/2005 Document Revised: 03/17/2012 Document Reviewed: 01/18/2014 ExitCare Patient Information 2015 [employer]. This information is not intended to replace advice given to you by your health care provider. Make sure you  discuss any questions you have with your health care provider.  

## 2014-07-06 NOTE — Progress Notes (Signed)
Reason for visit: Multiple sclerosis  Gabriela Rice is an 59 y.o. female  History of present illness:  Gabriela Rice is a 58 year old right-handed white female with a history of multiple sclerosis associated with a gait disorder. The patient has bilateral proximal muscle weakness in both legs, but within the last year she has developed some chronic low back pain. The pain is across the low back, worse on the right side than the left without radiation down into the legs. The patient has undergone MRI evaluation of the lumbosacral spine that shows multilevel facet joint arthritis with spondylosis that is most severe at the L2-3 level. The patient has undergone a single epidural steroid injection without benefit. The patient indicates that her pain is better when she is lying down, and if she is able to sit with back support, she feels minimal pain. However, when she stands, the pain in the back will ensue within several minutes. The patient indicates that the pain is worse later in the day, better in the morning. The patient is using a cane or a walker for ambulation. When the pain is severe, the patient may have increasing problems with dragging her legs. The patient has fallen on 2 occasions since last seen. The patient may have some burning in the right foot. The patient denies any change in function of the bowels or the bladder. The patient also has some chronic neck pain, without radiation down into the arms. The patient underwent physical therapy in May of 2015 without benefit. The patient is taking gabapentin and Xanax at night which helps her rest. She comes to this office for an evaluation.  Past Medical History  Diagnosis Date  . DM (diabetes mellitus)   . Hyperlipidemia   . Multiple sclerosis   . Allergic rhinitis   . Elevated LFTs   . Obesity   . Vitamin D deficiency   . Abnormality of gait 02/16/2013  . Colon polyps   . Diverticulosis   . Hemorrhoids   . Hepatitis     with  cholestasis  . Hemorrhoids   . Autoimmune hepatitis     Past Surgical History  Procedure Laterality Date  . Myoectomy    . Cesarean section      x 2    Family History  Problem Relation Age of Onset  . Heart disease Maternal Grandmother   . Colon cancer Maternal Aunt   . Uterine cancer Maternal Aunt   . Brain cancer Maternal Uncle   . Diabetes Mother   . Multiple sclerosis Mother     Social history:  reports that she quit smoking about 18 years ago. Her smoking use included Cigarettes. She has a 1 pack-year smoking history. She has never used smokeless tobacco. She reports that she does not drink alcohol or use illicit drugs.    Allergies  Allergen Reactions  . Penicillins Other (See Comments)    seizure as child  . Codeine Swelling    hands swell    Medications:  Current Outpatient Prescriptions on File Prior to Visit  Medication Sig Dispense Refill  . ALPRAZolam (XANAX) 1 MG tablet take 1 tablet by mouth at bedtime  90 tablet  1  . aspirin EC 81 MG tablet Take 81 mg by mouth daily.      Marland Kitchen azaTHIOprine (IMURAN) 50 MG tablet TAKE 3 TABLETS ONCE A DAY  90 tablet  3  . cetirizine (ZYRTEC) 10 MG tablet Take 10 mg by mouth daily.      Marland Kitchen  Cholecalciferol (VITAMIN D) 2000 UNITS CAPS Take 2,000 Units by mouth daily.       Marland Kitchen COPAXONE 40 MG/ML SOSY Inject 40 mg into the skin 3 (three) times a week.       . cyclobenzaprine (FLEXERIL) 5 MG tablet Take 1 tablet (5 mg total) by mouth 3 (three) times daily.  90 tablet  1  . ibuprofen (ADVIL,MOTRIN) 200 MG tablet Take 200 mg by mouth every 6 (six) hours as needed for pain.      Marland Kitchen losartan (COZAAR) 100 MG tablet Take 100 mg by mouth daily.       . metFORMIN (GLUCOPHAGE) 500 MG tablet Take 500 mg by mouth 2 (two) times daily with a meal.       . ondansetron (ZOFRAN) 4 MG tablet take 1 tablet every 8 hours if needed for nausea  30 tablet  0  . ONE TOUCH ULTRA TEST test strip       . polyethylene glycol powder (GLYCOLAX/MIRALAX) powder MIX  17G IN 8 OUNCES OF WATER OR JUICE THREE TIMES DAILY  527 g  3  . pravastatin (PRAVACHOL) 20 MG tablet       . trimethoprim (TRIMPEX) 100 MG tablet Take 100 mg by mouth daily.       No current facility-administered medications on file prior to visit.    ROS:  Out of a complete 14 system review of symptoms, the patient complains only of the following symptoms, and all other reviewed systems are negative.  Fatigue Visual loss Heat intolerance Insomnia Difficulty urinating, incontinence of bladder Joint pain, back pain, walking difficulties, neck pain, neck stiffness Dizziness, numbness, weakness Anxiety  Blood pressure 140/85, pulse 99, weight 198 lb 3.2 oz (89.903 kg).  Physical Exam  General: The patient is alert and cooperative at the time of the examination. The patient is moderately obese.  Neuromuscular: The patient has good range of movement of the low back without significant discomfort with palpation of the low back.  Skin: No significant peripheral edema is noted.   Neurologic Exam  Mental status: The patient is oriented x 3.  Cranial nerves: Facial symmetry is present. Speech is normal, no aphasia or dysarthria is noted. Extraocular movements are full. Visual fields are full. Pupils are equal, round, and reactive to light. Discs are flat bilaterally.  Motor: The patient has good strength in all 4 extremities, with the exception that there is 4 minus/5 strength proximally in both legs. The patient is unable to walk on the toes bilaterally, is able to walk on heels bilaterally.  Sensory examination: Soft touch sensation is symmetric on the face, arms, and legs.  Coordination: The patient has good finger-nose-finger and heel-to-shin bilaterally.  Gait and station: The patient has a slightly wide-based gait, she walks with a cane. Tandem gait was not attempted. Romberg is negative. No drift is seen.  Reflexes: Deep tendon reflexes are symmetric.   MRI lumbar  12/10/13:  IMPRESSION: Abnormal MRI scan of the lumbar spine showing prominent spondylitic changes throughout most severe at L2-3 where there is severe endplate marrow degenerative changes as well as disc osteophyte protrusion resulting in left more than right foraminal narrowing.    Assessment/Plan:  One. Multiple sclerosis  2. Chronic low back pain  3. Gait disorder  The patient is having ongoing discomfort with the back. The patient has not responded to epidural steroid injections or physical therapy in the past. The patient will go up on the gabapentin dose taking 300 mg 3  times daily. We may consider a pain Center referral in the future. The patient does have diffuse spondylosis in the lumbosacral spine, and this is not amenable to surgery. She will followup in about 4 months. The patient will remain on Copaxone, she will have blood work done today.  Jill Alexanders MD 07/06/2014 9:32 AM  [facility] [address] [place], [place] [address]  Phone [phone] Fax [phone]

## 2014-07-07 LAB — COMPREHENSIVE METABOLIC PANEL
ALT: 30 IU/L (ref 0–32)
AST: 41 IU/L — AB (ref 0–40)
Albumin/Globulin Ratio: 2.1 (ref 1.1–2.5)
Albumin: 4.7 g/dL (ref 3.5–5.5)
Alkaline Phosphatase: 60 IU/L (ref 39–117)
BUN/Creatinine Ratio: 21 (ref 9–23)
BUN: 18 mg/dL (ref 6–24)
CO2: 25 mmol/L (ref 18–29)
Calcium: 10 mg/dL (ref 8.7–10.2)
Chloride: 101 mmol/L (ref 97–108)
Creatinine, Ser: 0.84 mg/dL (ref 0.57–1.00)
GFR calc Af Amer: 89 mL/min/{1.73_m2} (ref >59)
GFR calc non Af Amer: 77 mL/min/{1.73_m2} (ref >59)
Globulin, Total: 2.2 g/dL (ref 1.5–4.5)
Glucose: 157 mg/dL — ABNORMAL HIGH (ref 65–99)
POTASSIUM: 4.1 mmol/L (ref 3.5–5.2)
SODIUM: 143 mmol/L (ref 134–144)
Total Bilirubin: 0.7 mg/dL (ref 0.0–1.2)
Total Protein: 6.9 g/dL (ref 6.0–8.5)

## 2014-07-07 LAB — CBC WITH DIFFERENTIAL
Basophils Absolute: 0 10*3/uL (ref 0.0–0.2)
Basos: 0 %
EOS ABS: 0.1 10*3/uL (ref 0.0–0.4)
EOS: 3 %
HCT: 36.7 % (ref 34.0–46.6)
HEMOGLOBIN: 13.2 g/dL (ref 11.1–15.9)
IMMATURE GRANS (ABS): 0 10*3/uL (ref 0.0–0.1)
Immature Granulocytes: 0 %
LYMPHS: 15 %
Lymphocytes Absolute: 0.7 10*3/uL (ref 0.7–3.1)
MCH: 31.6 pg (ref 26.6–33.0)
MCHC: 36 g/dL — AB (ref 31.5–35.7)
MCV: 88 fL (ref 79–97)
Monocytes Absolute: 0.3 10*3/uL (ref 0.1–0.9)
Monocytes: 7 %
NEUTROS PCT: 75 %
Neutrophils Absolute: 3.6 10*3/uL (ref 1.4–7.0)
Platelets: 299 10*3/uL (ref 150–379)
RBC: 4.18 x10E6/uL (ref 3.77–5.28)
RDW: 14.6 % (ref 12.3–15.4)
WBC: 4.7 10*3/uL (ref 3.4–10.8)

## 2014-07-08 ENCOUNTER — Other Ambulatory Visit: Payer: Self-pay | Admitting: Neurology

## 2014-07-11 NOTE — Telephone Encounter (Signed)
Rx signed and faxed.

## 2014-07-13 ENCOUNTER — Other Ambulatory Visit: Payer: Self-pay | Admitting: Neurology

## 2014-07-15 ENCOUNTER — Other Ambulatory Visit: Payer: Self-pay

## 2014-07-15 ENCOUNTER — Other Ambulatory Visit: Payer: Self-pay | Admitting: Neurology

## 2014-07-15 NOTE — Telephone Encounter (Signed)
The patient will go up on the gabapentin dose taking 300 mg 3 times daily.

## 2014-08-04 ENCOUNTER — Encounter: Payer: Self-pay | Admitting: Internal Medicine

## 2014-08-04 NOTE — Telephone Encounter (Signed)
Pt states she is having problems with constipation. Pt states she has not been in for labs or a visit in a while. Requests to be seen. Pt scheduled to see Amy Esterwood PA 08/17/14@1 :30pm. Pt aware of appt.

## 2014-08-09 ENCOUNTER — Ambulatory Visit: Payer: BC Managed Care – PPO | Admitting: Nurse Practitioner

## 2014-08-10 ENCOUNTER — Encounter: Payer: Self-pay | Admitting: Internal Medicine

## 2014-08-14 ENCOUNTER — Encounter: Payer: Self-pay | Admitting: Neurology

## 2014-08-15 ENCOUNTER — Other Ambulatory Visit: Payer: Self-pay | Admitting: Neurology

## 2014-08-15 DIAGNOSIS — G8929 Other chronic pain: Secondary | ICD-10-CM

## 2014-08-15 DIAGNOSIS — M545 Low back pain, unspecified: Secondary | ICD-10-CM

## 2014-08-15 NOTE — Progress Notes (Signed)
The patient has requested a referral to a pain center as we had previously discussed. I will send patient to Dr. Consuela Mimes at [facility] pain center.

## 2014-08-17 ENCOUNTER — Other Ambulatory Visit: Payer: Self-pay

## 2014-08-17 ENCOUNTER — Ambulatory Visit (INDEPENDENT_AMBULATORY_CARE_PROVIDER_SITE_OTHER): Payer: BC Managed Care – PPO | Admitting: Physician Assistant

## 2014-08-17 ENCOUNTER — Other Ambulatory Visit (INDEPENDENT_AMBULATORY_CARE_PROVIDER_SITE_OTHER): Payer: BC Managed Care – PPO

## 2014-08-17 ENCOUNTER — Encounter: Payer: Self-pay | Admitting: Physician Assistant

## 2014-08-17 ENCOUNTER — Other Ambulatory Visit: Payer: Self-pay | Admitting: Internal Medicine

## 2014-08-17 ENCOUNTER — Other Ambulatory Visit: Payer: BC Managed Care – PPO

## 2014-08-17 VITALS — BP 116/68 | HR 68 | Ht 63.5 in | Wt 200.1 lb

## 2014-08-17 DIAGNOSIS — K5909 Other constipation: Secondary | ICD-10-CM

## 2014-08-17 DIAGNOSIS — K754 Autoimmune hepatitis: Secondary | ICD-10-CM

## 2014-08-17 DIAGNOSIS — K5900 Constipation, unspecified: Secondary | ICD-10-CM

## 2014-08-17 LAB — URINALYSIS, ROUTINE W REFLEX MICROSCOPIC
BILIRUBIN URINE: NEGATIVE
Hgb urine dipstick: NEGATIVE
Ketones, ur: NEGATIVE
Nitrite: POSITIVE — AB
Specific Gravity, Urine: 1.025 (ref 1.000–1.030)
Total Protein, Urine: NEGATIVE
UROBILINOGEN UA: 0.2 (ref 0.0–1.0)
Urine Glucose: NEGATIVE
pH: 6 (ref 5.0–8.0)

## 2014-08-17 MED ORDER — AZATHIOPRINE 50 MG PO TABS: ORAL_TABLET | ORAL | Status: DC

## 2014-08-17 MED ORDER — CIPROFLOXACIN HCL 250 MG PO TABS: 250.0000 mg | ORAL_TABLET | Freq: Two times a day (BID) | ORAL | Status: AC

## 2014-08-17 NOTE — Progress Notes (Signed)
agree

## 2014-08-17 NOTE — Patient Instructions (Signed)
Please go to the basement level to have your labs drawn.   You will come to our lab Nov 17, 2014 for labs.  Continue Miralax daily. You can take a probiotic, Align, or Cultrell daily. You can get this at your pharmacy, [facility], [place], [facility], [facility], [facility].  Take 1 daily. We have given you a coupon.  We sent a year refills of Imuran 50 mg to [facility].  Take 3  (50mg ) tab together once daily .

## 2014-08-17 NOTE — Progress Notes (Signed)
Patient ID: Gabriela Rice, female   DOB: 1955/12/29, 58 y.o.   MRN: [id number]   Subjective:    Patient ID: Gabriela Rice, female    DOB: September 22, 1956, 58 y.o.   MRN: [id number]  HPI Gabriela Rice is a pleasant 58 year old female known to Gabriela Rice. She has history of MS which has been worse over the past year, now having difficulty ambulating. Also with adult-onset diabetes mellitus, hyperlipidemia, history of diverticulosis and hyperplastic colon polyps on last colonoscopy 2011. She has a diagnosis of autoimmune hepatitis, was diagnosed in May 2014 and is now on Imuran 150 mg by mouth daily. She has had normal LFTs over the past year. She comes in today for follow-up. She states that she had labs done about a month ago. These are reviewed CBC was unremarkable and LFTs completely normal with the exception of AST of 41. She relates chronic problems with constipation, often despite using MiraLAX. She says she went for several months with ongoing problems with constipation and noted the past week or so has been feeling a bit better. He says her stool caliber is always small she has not noted any melena or hematochezia. She does admit to a lot of straining and frequent bloating. She also has been having dysuria over the past couple of days has history of frequent UTIs and asked if we can evaluate for urinary tract infection.  Review of Systems  Outpatient Encounter Prescriptions as of 08/17/2014  Medication Sig  . ALPRAZolam (XANAX) 1 MG tablet take 1 tablet by mouth at bedtime  . aspirin EC 81 MG tablet Take 81 mg by mouth daily.  Marland Kitchen azaTHIOprine (IMURAN) 50 MG tablet Take 3 tab at the same time daily.  . cetirizine (ZYRTEC) 10 MG tablet Take 10 mg by mouth daily.  . Cholecalciferol (VITAMIN D) 2000 UNITS CAPS Take 2,000 Units by mouth daily.   Marland Kitchen COPAXONE 40 MG/ML SOSY INJECT ONE SYRINGE UNDER THE SKIN THREE TIMES A WEEK  . gabapentin (NEURONTIN) 300 MG capsule Take 1 capsule (300 mg total) by  mouth 3 (three) times daily.  Marland Kitchen ibuprofen (ADVIL,MOTRIN) 200 MG tablet Take 200 mg by mouth every 6 (six) hours as needed for pain.  Marland Kitchen losartan (COZAAR) 100 MG tablet Take 100 mg by mouth daily.   . metFORMIN (GLUCOPHAGE) 500 MG tablet Take 500 mg by mouth 2 (two) times daily with a meal.   . ondansetron (ZOFRAN) 4 MG tablet take 1 tablet every 8 hours if needed for nausea  . ONE TOUCH ULTRA TEST test strip   . polyethylene glycol powder (GLYCOLAX/MIRALAX) powder MIX 17G IN 8 OUNCES OF WATER OR JUICE THREE TIMES DAILY  . pravastatin (PRAVACHOL) 40 MG tablet Take 1 tablet by mouth daily.  Marland Kitchen trimethoprim (TRIMPEX) 100 MG tablet Take 100 mg by mouth daily.  . [DISCONTINUED] azaTHIOprine (IMURAN) 50 MG tablet TAKE 3 TABLETS ONCE A DAY  . [DISCONTINUED] pravastatin (PRAVACHOL) 20 MG tablet   . cyclobenzaprine (FLEXERIL) 5 MG tablet Take 1 tablet (5 mg total) by mouth 3 (three) times daily.  . [DISCONTINUED] gabapentin (NEURONTIN) 300 MG capsule Take 1 capsule (300 mg total) by mouth 3 (three) times daily.   Allergies  Allergen Reactions  . Penicillins Other (See Comments)    seizure as child  . Codeine Swelling    hands swell   Patient Active Problem List   Diagnosis Date Noted  . Lumbago 02/02/2014  . Difficulty in walking(719.7) 02/02/2014  . HBP (high blood  pressure) 07/11/2013  . Chronic rhinitis 04/08/2013  . Abnormality of gait 02/16/2013  . Elevated liver function tests 02/12/2013  . Abdominal pain, right upper quadrant 02/12/2013  . Nausea alone 02/12/2013  . Dyspnea 02/04/2013  . Hypoxemia, nocturnal only  02/04/2013  . Encounter for long-term (current) use of other medications 10/14/2012  . Multiple sclerosis 10/14/2012  . IRRITABLE BOWEL SYNDROME 03/29/2009  . FATTY LIVER DISEASE 03/29/2009  . Elev transaminase/LDH 03/29/2009  . PERSONAL HX COLONIC POLYPS 03/29/2009   History   Social History  . Marital Status: Married    Spouse Name: N/A    Number of Children: 2    . Years of Education: HS   Occupational History  . Unemployed    Social History Main Topics  . Smoking status: Former Smoker -- 0.25 packs/day for 4 years    Types: Cigarettes    Quit date: 10/01/1995  . Smokeless tobacco: Never Used  . Alcohol Use: No  . Drug Use: No  . Sexual Activity: Not on file   Other Topics Concern  . Not on file   Social History Narrative    Gabriela Rice's family history includes Brain cancer in her maternal uncle; Colon cancer in her maternal aunt; Diabetes in her mother; Heart disease in her maternal grandmother; Multiple sclerosis in her mother; Uterine cancer in her maternal aunt.      Objective:    Filed Vitals:   08/17/14 1330  BP: 116/68  Pulse: 68    Physical Exam  Well-developed white female in no acute distress, she is ambulating with a walker, and has difficulty getting on the exam table. Blood pressure 116/68 pulse 68 height 5 foot 3 weight 200. HEENT; nontraumatic, normocephalic EOMI PERRLA sclerae anicteric, Neck;supple JVD, Cardiovascular;regular rate and rhythm with S1-S2 no murmur rub or gallop, Pulmonary; clear bilaterally, Abdomen; soft nontender nondistended bowel sounds are active there is no palpable mass or hepatosplenomegaly, Rectal ;exam not done, Extremities ;no clubbing cyanosis or edema skin warm and dry, Psych; mood and affect appropriate    Assessment & Plan:   #70  58 year old female with history of autoimmune hepatitis, being maintained on Imuran 150 mg by mouth daily. Most recent LFTs essentially normal.Will repeat CBC and hepatic panel in 3 months #2 chronic constipation-offered trial of Linzess-she declines at this time because of concerns with extensive multiple prescriptions, she'll continue MiraLAX once or twice daily and add a daily probiotic. Suggested align, florastor, or restora #3 dysuria-check UA today #4 colon neoplasia screening-patient does not need colonoscopy at this time has been slated for 10 year  interval follow-up and will be due in 2021  Patient will follow up with Gabriela Rice in 6 months or sooner if needed   Amy Genia Harold PA-C 08/17/2014

## 2014-09-06 ENCOUNTER — Ambulatory Visit: Payer: BC Managed Care – PPO | Admitting: Neurology

## 2014-09-16 ENCOUNTER — Ambulatory Visit: Payer: Self-pay | Admitting: Pain Medicine

## 2014-09-16 LAB — SEDIMENTATION RATE: Erythrocyte Sed Rate: 18 mm/hr (ref 0–30)

## 2014-09-16 LAB — MAGNESIUM: Magnesium: 2.1 mg/dL

## 2014-09-16 LAB — BASIC METABOLIC PANEL
ANION GAP: 8 (ref 7–16)
BUN: 21 mg/dL — AB (ref 7–18)
CALCIUM: 9.2 mg/dL (ref 8.5–10.1)
CO2: 29 mmol/L (ref 21–32)
Chloride: 105 mmol/L (ref 98–107)
Creatinine: 0.86 mg/dL (ref 0.60–1.30)
EGFR (African American): >60
EGFR (Non-African Amer.): >60
Glucose: 111 mg/dL — ABNORMAL HIGH (ref 65–99)
Osmolality: 287 (ref 275–301)
Potassium: 3.9 mmol/L (ref 3.5–5.1)
Sodium: 142 mmol/L (ref 136–145)

## 2014-09-16 LAB — HEPATIC FUNCTION PANEL A (ARMC)
ALK PHOS: 69 U/L
Albumin: 4 g/dL (ref 3.4–5.0)
Bilirubin,Total: 0.6 mg/dL (ref 0.2–1.0)
SGOT(AST): 41 U/L — ABNORMAL HIGH (ref 15–37)
SGPT (ALT): 42 U/L
Total Protein: 7.7 g/dL (ref 6.4–8.2)

## 2014-11-08 ENCOUNTER — Ambulatory Visit (INDEPENDENT_AMBULATORY_CARE_PROVIDER_SITE_OTHER): Payer: Medicare Other | Admitting: Neurology

## 2014-11-08 ENCOUNTER — Encounter: Payer: Self-pay | Admitting: Neurology

## 2014-11-08 VITALS — BP 126/82 | HR 93 | Ht 63.0 in | Wt 204.6 lb

## 2014-11-08 DIAGNOSIS — R269 Unspecified abnormalities of gait and mobility: Secondary | ICD-10-CM

## 2014-11-08 DIAGNOSIS — G35 Multiple sclerosis: Secondary | ICD-10-CM | POA: Diagnosis not present

## 2014-11-08 DIAGNOSIS — Z5181 Encounter for therapeutic drug level monitoring: Secondary | ICD-10-CM

## 2014-11-08 NOTE — Patient Instructions (Signed)

## 2014-11-08 NOTE — Progress Notes (Signed)
Reason for visit: Multiple sclerosis  Gabriela Rice is an 59 y.o. female  History of present illness:  Gabriela Rice is a 59 year old right-handed white female with a history of multiple sclerosis associated with a gait disorder. The patient has bilateral proximal leg weakness associated with MS, but she also has problems with low back pain. She has recently made contact with Dr. Harl Favor, and she is set up to have a facet joint injection in the near future. The patient still indicates she has significant back pain when she is weightbearing. She is shuffling her feet because she cannot pick the feet up. She cannot do leg strengthening exercises because it adversely affects her back pain. She has not had any falls, but she is using a walker inside and outside the house now. She denies any changes in bowel or bladder function, but she has chronic constipation issues. She denies any vision changes, or new numbness. She is on Copaxone, tolerating medication well. She is on Imuran for autoimmune hepatitis.  Past Medical History  Diagnosis Date  . DM (diabetes mellitus)   . Hyperlipidemia   . Multiple sclerosis   . Allergic rhinitis   . Elevated LFTs   . Obesity   . Vitamin D deficiency   . Abnormality of gait 02/16/2013  . Colon polyps   . Diverticulosis   . Hemorrhoids   . Hepatitis     with cholestasis  . Hemorrhoids   . Autoimmune hepatitis     Past Surgical History  Procedure Laterality Date  . Myoectomy    . Cesarean section      x 2    Family History  Problem Relation Age of Onset  . Heart disease Maternal Grandmother   . Colon cancer Maternal Aunt   . Uterine cancer Maternal Aunt   . Brain cancer Maternal Uncle   . Diabetes Mother   . Multiple sclerosis Mother     Social history:  reports that she quit smoking about 19 years ago. Her smoking use included Cigarettes. She has a 1 pack-year smoking history. She has never used smokeless tobacco. She reports that she  does not drink alcohol or use illicit drugs.    Allergies  Allergen Reactions  . Penicillins Other (See Comments)    seizure as child  . Codeine Swelling    hands swell    Medications:  Current Outpatient Prescriptions on File Prior to Visit  Medication Sig Dispense Refill  . ALPRAZolam (XANAX) 1 MG tablet take 1 tablet by mouth at bedtime 90 tablet 1  . aspirin EC 81 MG tablet Take 81 mg by mouth daily.    Marland Kitchen azaTHIOprine (IMURAN) 50 MG tablet Take 3 tab at the same time daily. 90 tablet 11  . cetirizine (ZYRTEC) 10 MG tablet Take 10 mg by mouth daily.    . Cholecalciferol (VITAMIN D) 2000 UNITS CAPS Take 2,000 Units by mouth daily.     Marland Kitchen COPAXONE 40 MG/ML SOSY INJECT ONE SYRINGE UNDER THE SKIN THREE TIMES A WEEK 36 Syringe 1  . gabapentin (NEURONTIN) 300 MG capsule Take 1 capsule (300 mg total) by mouth 3 (three) times daily. (Patient taking differently: Take 300 mg by mouth daily. ) 270 capsule 2  . ibuprofen (ADVIL,MOTRIN) 200 MG tablet Take 200 mg by mouth every 6 (six) hours as needed for pain.    Marland Kitchen losartan (COZAAR) 100 MG tablet Take 100 mg by mouth daily.     . metFORMIN (GLUCOPHAGE) 500  MG tablet Take 500 mg by mouth 2 (two) times daily with a meal.     . ondansetron (ZOFRAN) 4 MG tablet take 1 tablet every 8 hours if needed for nausea 30 tablet 0  . ONE TOUCH ULTRA TEST test strip     . polyethylene glycol powder (GLYCOLAX/MIRALAX) powder TAKE 17 GRAMS IN 8 OUNCES OF WATER OR JUICE THREE TIMES DAILY 527 g 3  . pravastatin (PRAVACHOL) 40 MG tablet Take 1 tablet by mouth daily.    Marland Kitchen trimethoprim (TRIMPEX) 100 MG tablet Take 100 mg by mouth daily.     No current facility-administered medications on file prior to visit.    ROS:  Out of a complete 14 system review of symptoms, the patient complains only of the following symptoms, and all other reviewed systems are negative.  Fatigue Light sensitivity, loss of vision  Heat intolerance Abdominal pain, constipation,  nausea Insomnia Incontinence of the bladder Joint pain, back pain, walking difficulties, neck pain Dizziness, numbness, weakness Anxiety   Blood pressure 126/82, pulse 93, height 5\' 3"  (1.6 m), weight 204 lb 9.6 oz (92.806 kg).  Physical Exam  General: The patient is alert and cooperative at the time of the examination. The patient is moderately obese.  Skin: No significant peripheral edema is noted.   Neurologic Exam  Mental status: The patient is oriented x 3.  Cranial nerves: Facial symmetry is present. Speech is normal, no aphasia or dysarthria is noted. Extraocular movements are full. Visual fields are full. Pupils are equal, round, and reactive to light. Discs are flat bilaterally.  Motor: The patient has good strength in all 4 extremities, with exception of 4 minus/5 strength proximally in both legs.  Sensory examination: Soft touch sensation is symmetric on the face, arms, and legs.  Coordination: The patient has good finger-nose-finger and heel-to-shin bilaterally.  Gait and station: The patient has a  wide-based, unsteady gait. The patient uses a walker for ambulation. Tandem gait was not attempted. Romberg is negative. No drift is seen.  Reflexes: Deep tendon reflexes are symmetric.   Assessment/Plan:  1. Multiple sclerosis  2. Gait disorder  3. Chronic low back pain  The patient is still having chronic back pain. The patient is being followed through a pain center currently. She will remain on the Copaxone, she will have blood work done today. She will follow-up in 6 months. If the back pain can be improved, the patient may engage in leg strengthening exercises. The leg weakness is likely mainly related to the multiple sclerosis.  Jill Alexanders MD 11/08/2014 8:25 PM  [facility] [address] [place], [place] [address]  Phone [phone] Fax [phone]

## 2014-11-09 LAB — COMPREHENSIVE METABOLIC PANEL
ALT: 41 IU/L — ABNORMAL HIGH (ref 0–32)
AST: 51 IU/L — AB (ref 0–40)
Albumin/Globulin Ratio: 1.8 (ref 1.1–2.5)
Albumin: 4.7 g/dL (ref 3.5–5.5)
Alkaline Phosphatase: 66 IU/L (ref 39–117)
BILIRUBIN TOTAL: 0.4 mg/dL (ref 0.0–1.2)
BUN / CREAT RATIO: 30 — AB (ref 9–23)
BUN: 21 mg/dL (ref 6–24)
CO2: 18 mmol/L (ref 18–29)
CREATININE: 0.71 mg/dL (ref 0.57–1.00)
Calcium: 10.2 mg/dL (ref 8.7–10.2)
Chloride: 105 mmol/L (ref 97–108)
GFR calc non Af Amer: 94 mL/min/{1.73_m2} (ref >59)
GFR, EST AFRICAN AMERICAN: 109 mL/min/{1.73_m2} (ref >59)
Globulin, Total: 2.6 g/dL (ref 1.5–4.5)
Glucose: 140 mg/dL — ABNORMAL HIGH (ref 65–99)
Potassium: 4.6 mmol/L (ref 3.5–5.2)
SODIUM: 144 mmol/L (ref 134–144)
Total Protein: 7.3 g/dL (ref 6.0–8.5)

## 2014-11-09 LAB — CBC WITH DIFFERENTIAL/PLATELET
Basophils Absolute: 0 10*3/uL (ref 0.0–0.2)
Basos: 0 %
EOS ABS: 0.2 10*3/uL (ref 0.0–0.4)
Eos: 3 %
HCT: 38.2 % (ref 34.0–46.6)
Hemoglobin: 13.4 g/dL (ref 11.1–15.9)
IMMATURE GRANS (ABS): 0 10*3/uL (ref 0.0–0.1)
IMMATURE GRANULOCYTES: 0 %
LYMPHS ABS: 0.9 10*3/uL (ref 0.7–3.1)
Lymphs: 16 %
MCH: 30.2 pg (ref 26.6–33.0)
MCHC: 35.1 g/dL (ref 31.5–35.7)
MCV: 86 fL (ref 79–97)
Monocytes Absolute: 0.7 10*3/uL (ref 0.1–0.9)
Monocytes: 13 %
NEUTROS PCT: 68 %
Neutrophils Absolute: 3.5 10*3/uL (ref 1.4–7.0)
Platelets: 253 10*3/uL (ref 150–379)
RBC: 4.43 x10E6/uL (ref 3.77–5.28)
RDW: 14.6 % (ref 12.3–15.4)
WBC: 5.2 10*3/uL (ref 3.4–10.8)

## 2014-11-13 ENCOUNTER — Other Ambulatory Visit: Payer: Self-pay | Admitting: Internal Medicine

## 2014-11-14 ENCOUNTER — Other Ambulatory Visit: Payer: Self-pay | Admitting: *Deleted

## 2014-11-14 ENCOUNTER — Encounter: Payer: Self-pay | Admitting: Physician Assistant

## 2014-11-14 ENCOUNTER — Encounter: Payer: Self-pay | Admitting: *Deleted

## 2014-11-14 DIAGNOSIS — K754 Autoimmune hepatitis: Secondary | ICD-10-CM

## 2014-11-25 ENCOUNTER — Ambulatory Visit: Payer: Self-pay | Admitting: Pain Medicine

## 2014-11-25 DIAGNOSIS — M545 Low back pain: Secondary | ICD-10-CM | POA: Diagnosis not present

## 2014-11-25 DIAGNOSIS — M5416 Radiculopathy, lumbar region: Secondary | ICD-10-CM | POA: Diagnosis not present

## 2014-11-25 DIAGNOSIS — M25552 Pain in left hip: Secondary | ICD-10-CM | POA: Diagnosis not present

## 2014-11-25 DIAGNOSIS — M47811 Spondylosis without myelopathy or radiculopathy, occipito-atlanto-axial region: Secondary | ICD-10-CM | POA: Diagnosis not present

## 2014-11-25 DIAGNOSIS — M25551 Pain in right hip: Secondary | ICD-10-CM | POA: Diagnosis not present

## 2014-11-25 DIAGNOSIS — G894 Chronic pain syndrome: Secondary | ICD-10-CM | POA: Diagnosis not present

## 2014-12-14 DIAGNOSIS — M545 Low back pain: Secondary | ICD-10-CM | POA: Diagnosis not present

## 2014-12-14 DIAGNOSIS — M47816 Spondylosis without myelopathy or radiculopathy, lumbar region: Secondary | ICD-10-CM | POA: Diagnosis not present

## 2014-12-14 DIAGNOSIS — M5387 Other specified dorsopathies, lumbosacral region: Secondary | ICD-10-CM | POA: Diagnosis not present

## 2014-12-14 DIAGNOSIS — M488X6 Other specified spondylopathies, lumbar region: Secondary | ICD-10-CM | POA: Diagnosis not present

## 2014-12-21 DIAGNOSIS — N3020 Other chronic cystitis without hematuria: Secondary | ICD-10-CM | POA: Diagnosis not present

## 2014-12-21 DIAGNOSIS — R3912 Poor urinary stream: Secondary | ICD-10-CM | POA: Diagnosis not present

## 2014-12-27 ENCOUNTER — Other Ambulatory Visit: Payer: Self-pay | Admitting: Neurology

## 2014-12-28 DIAGNOSIS — M5387 Other specified dorsopathies, lumbosacral region: Secondary | ICD-10-CM | POA: Diagnosis not present

## 2014-12-28 DIAGNOSIS — M5136 Other intervertebral disc degeneration, lumbar region: Secondary | ICD-10-CM | POA: Diagnosis not present

## 2014-12-28 DIAGNOSIS — G894 Chronic pain syndrome: Secondary | ICD-10-CM | POA: Diagnosis not present

## 2015-01-30 ENCOUNTER — Other Ambulatory Visit: Payer: Self-pay | Admitting: Neurology

## 2015-02-06 ENCOUNTER — Encounter: Payer: Self-pay | Admitting: Neurology

## 2015-02-07 ENCOUNTER — Other Ambulatory Visit: Payer: Self-pay | Admitting: Neurology

## 2015-02-07 MED ORDER — ALPRAZOLAM 1 MG PO TABS: 1.0000 mg | ORAL_TABLET | Freq: Every day | ORAL | Status: DC

## 2015-02-08 ENCOUNTER — Telehealth: Payer: Self-pay

## 2015-02-08 NOTE — Telephone Encounter (Signed)
I called [facility] and verified they have the Rx Dr. Jannifer Franklin faxed yesterday. I also called the patient to let her know [facility] has the Rx.

## 2015-02-20 ENCOUNTER — Encounter: Payer: Self-pay | Admitting: Physician Assistant

## 2015-02-24 ENCOUNTER — Other Ambulatory Visit (INDEPENDENT_AMBULATORY_CARE_PROVIDER_SITE_OTHER): Payer: BLUE CROSS/BLUE SHIELD

## 2015-02-24 DIAGNOSIS — K5900 Constipation, unspecified: Secondary | ICD-10-CM | POA: Diagnosis not present

## 2015-02-24 DIAGNOSIS — K5909 Other constipation: Secondary | ICD-10-CM

## 2015-02-24 DIAGNOSIS — K754 Autoimmune hepatitis: Secondary | ICD-10-CM

## 2015-02-24 LAB — HEPATIC FUNCTION PANEL
ALT: 34 U/L (ref 0–35)
AST: 39 U/L — AB (ref 0–37)
Albumin: 4.3 g/dL (ref 3.5–5.2)
Alkaline Phosphatase: 57 U/L (ref 39–117)
BILIRUBIN DIRECT: 0.2 mg/dL (ref 0.0–0.3)
TOTAL PROTEIN: 7.1 g/dL (ref 6.0–8.3)
Total Bilirubin: 0.7 mg/dL (ref 0.2–1.2)

## 2015-02-24 LAB — CBC WITH DIFFERENTIAL/PLATELET
BASOS ABS: 0 10*3/uL (ref 0.0–0.1)
Basophils Relative: 0.4 % (ref 0.0–3.0)
EOS ABS: 0.1 10*3/uL (ref 0.0–0.7)
Eosinophils Relative: 2.1 % (ref 0.0–5.0)
HCT: 37.1 % (ref 36.0–46.0)
Hemoglobin: 12.6 g/dL (ref 12.0–15.0)
Lymphocytes Relative: 16.5 % (ref 12.0–46.0)
Lymphs Abs: 0.7 10*3/uL (ref 0.7–4.0)
MCHC: 34 g/dL (ref 30.0–36.0)
MCV: 91.8 fl (ref 78.0–100.0)
MONO ABS: 0.4 10*3/uL (ref 0.1–1.0)
Monocytes Relative: 9.3 % (ref 3.0–12.0)
NEUTROS PCT: 71.7 % (ref 43.0–77.0)
Neutro Abs: 3.1 10*3/uL (ref 1.4–7.7)
Platelets: 226 10*3/uL (ref 150.0–400.0)
RBC: 4.04 Mil/uL (ref 3.87–5.11)
RDW: 15.5 % (ref 11.5–15.5)
WBC: 4.4 10*3/uL (ref 4.0–10.5)

## 2015-03-01 DIAGNOSIS — R109 Unspecified abdominal pain: Secondary | ICD-10-CM | POA: Diagnosis not present

## 2015-03-01 DIAGNOSIS — I10 Essential (primary) hypertension: Secondary | ICD-10-CM | POA: Diagnosis not present

## 2015-03-01 DIAGNOSIS — E559 Vitamin D deficiency, unspecified: Secondary | ICD-10-CM | POA: Diagnosis not present

## 2015-03-01 DIAGNOSIS — E119 Type 2 diabetes mellitus without complications: Secondary | ICD-10-CM | POA: Diagnosis not present

## 2015-03-01 DIAGNOSIS — E782 Mixed hyperlipidemia: Secondary | ICD-10-CM | POA: Diagnosis not present

## 2015-03-06 ENCOUNTER — Other Ambulatory Visit: Payer: Self-pay | Admitting: Internal Medicine

## 2015-03-18 ENCOUNTER — Other Ambulatory Visit: Payer: Self-pay | Admitting: Internal Medicine

## 2015-04-04 DIAGNOSIS — G894 Chronic pain syndrome: Secondary | ICD-10-CM | POA: Diagnosis not present

## 2015-04-04 DIAGNOSIS — G35 Multiple sclerosis: Secondary | ICD-10-CM | POA: Diagnosis not present

## 2015-04-04 DIAGNOSIS — R4589 Other symptoms and signs involving emotional state: Secondary | ICD-10-CM | POA: Diagnosis not present

## 2015-04-04 DIAGNOSIS — M488X6 Other specified spondylopathies, lumbar region: Secondary | ICD-10-CM | POA: Diagnosis not present

## 2015-04-04 DIAGNOSIS — F419 Anxiety disorder, unspecified: Secondary | ICD-10-CM | POA: Diagnosis not present

## 2015-04-04 DIAGNOSIS — M5387 Other specified dorsopathies, lumbosacral region: Secondary | ICD-10-CM | POA: Diagnosis not present

## 2015-04-04 DIAGNOSIS — F329 Major depressive disorder, single episode, unspecified: Secondary | ICD-10-CM | POA: Diagnosis not present

## 2015-04-04 DIAGNOSIS — M47816 Spondylosis without myelopathy or radiculopathy, lumbar region: Secondary | ICD-10-CM | POA: Diagnosis not present

## 2015-05-04 ENCOUNTER — Other Ambulatory Visit: Payer: Self-pay | Admitting: Family Medicine

## 2015-05-04 DIAGNOSIS — R1084 Generalized abdominal pain: Secondary | ICD-10-CM

## 2015-05-08 ENCOUNTER — Ambulatory Visit
Admission: RE | Admit: 2015-05-08 | Discharge: 2015-05-08 | Disposition: A | Discharge status: Home / Self Care | Payer: BLUE CROSS/BLUE SHIELD | Source: Ambulatory Visit | Attending: Family Medicine | Admitting: Family Medicine

## 2015-05-08 DIAGNOSIS — R1084 Generalized abdominal pain: Secondary | ICD-10-CM

## 2015-05-08 DIAGNOSIS — D1771 Benign lipomatous neoplasm of kidney: Secondary | ICD-10-CM | POA: Diagnosis not present

## 2015-05-08 MED ORDER — IOPAMIDOL (ISOVUE-300) INJECTION 61%
100.0000 mL | Freq: Once | INTRAVENOUS | Status: AC | PRN
  Administered 2015-05-08: 100 mL via INTRAVENOUS

## 2015-05-10 ENCOUNTER — Ambulatory Visit: Payer: Medicare Other | Admitting: Neurology

## 2015-05-11 ENCOUNTER — Ambulatory Visit: Payer: BLUE CROSS/BLUE SHIELD | Admitting: Gastroenterology

## 2015-05-23 ENCOUNTER — Encounter: Payer: Self-pay | Admitting: Physician Assistant

## 2015-05-23 DIAGNOSIS — M47816 Spondylosis without myelopathy or radiculopathy, lumbar region: Secondary | ICD-10-CM | POA: Diagnosis not present

## 2015-05-23 DIAGNOSIS — M47817 Spondylosis without myelopathy or radiculopathy, lumbosacral region: Secondary | ICD-10-CM | POA: Diagnosis not present

## 2015-05-23 DIAGNOSIS — G894 Chronic pain syndrome: Secondary | ICD-10-CM | POA: Diagnosis not present

## 2015-05-23 DIAGNOSIS — M488X6 Other specified spondylopathies, lumbar region: Secondary | ICD-10-CM | POA: Diagnosis not present

## 2015-05-23 DIAGNOSIS — M545 Low back pain: Secondary | ICD-10-CM | POA: Diagnosis not present

## 2015-05-23 DIAGNOSIS — G35 Multiple sclerosis: Secondary | ICD-10-CM | POA: Diagnosis not present

## 2015-05-23 DIAGNOSIS — M5387 Other specified dorsopathies, lumbosacral region: Secondary | ICD-10-CM | POA: Diagnosis not present

## 2015-06-16 ENCOUNTER — Other Ambulatory Visit: Payer: Self-pay

## 2015-06-16 MED ORDER — COPAXONE 40 MG/ML ~~LOC~~ SOSY: PREFILLED_SYRINGE | SUBCUTANEOUS | Status: DC

## 2015-06-23 ENCOUNTER — Other Ambulatory Visit: Payer: Self-pay

## 2015-06-23 DIAGNOSIS — Z1231 Encounter for screening mammogram for malignant neoplasm of breast: Secondary | ICD-10-CM

## 2015-07-03 ENCOUNTER — Encounter: Payer: Self-pay | Admitting: Neurology

## 2015-07-04 ENCOUNTER — Other Ambulatory Visit: Payer: Self-pay | Admitting: Neurology

## 2015-07-04 DIAGNOSIS — M545 Low back pain, unspecified: Secondary | ICD-10-CM

## 2015-07-08 ENCOUNTER — Other Ambulatory Visit: Payer: Self-pay | Admitting: Internal Medicine

## 2015-07-12 ENCOUNTER — Ambulatory Visit: Payer: BLUE CROSS/BLUE SHIELD | Admitting: Pain Medicine

## 2015-07-13 ENCOUNTER — Ambulatory Visit: Payer: BLUE CROSS/BLUE SHIELD | Attending: Pain Medicine | Admitting: Pain Medicine

## 2015-07-13 ENCOUNTER — Encounter: Payer: Self-pay | Admitting: Pain Medicine

## 2015-07-13 VITALS — BP 115/85 | HR 93 | Temp 98.0°F | Resp 18 | Ht 64.0 in | Wt 200.0 lb

## 2015-07-13 DIAGNOSIS — M549 Dorsalgia, unspecified: Secondary | ICD-10-CM | POA: Diagnosis not present

## 2015-07-13 DIAGNOSIS — G8929 Other chronic pain: Secondary | ICD-10-CM | POA: Diagnosis not present

## 2015-07-13 DIAGNOSIS — M545 Low back pain, unspecified: Secondary | ICD-10-CM

## 2015-07-13 DIAGNOSIS — M5416 Radiculopathy, lumbar region: Secondary | ICD-10-CM

## 2015-07-13 DIAGNOSIS — M47816 Spondylosis without myelopathy or radiculopathy, lumbar region: Secondary | ICD-10-CM | POA: Diagnosis not present

## 2015-07-13 DIAGNOSIS — M791 Myalgia: Secondary | ICD-10-CM

## 2015-07-13 DIAGNOSIS — M47812 Spondylosis without myelopathy or radiculopathy, cervical region: Secondary | ICD-10-CM | POA: Insufficient documentation

## 2015-07-13 DIAGNOSIS — K589 Irritable bowel syndrome without diarrhea: Secondary | ICD-10-CM | POA: Insufficient documentation

## 2015-07-13 DIAGNOSIS — Z90710 Acquired absence of both cervix and uterus: Secondary | ICD-10-CM | POA: Insufficient documentation

## 2015-07-13 DIAGNOSIS — G35 Multiple sclerosis: Secondary | ICD-10-CM

## 2015-07-13 DIAGNOSIS — E119 Type 2 diabetes mellitus without complications: Secondary | ICD-10-CM | POA: Insufficient documentation

## 2015-07-13 DIAGNOSIS — M79604 Pain in right leg: Secondary | ICD-10-CM | POA: Insufficient documentation

## 2015-07-13 DIAGNOSIS — E785 Hyperlipidemia, unspecified: Secondary | ICD-10-CM | POA: Insufficient documentation

## 2015-07-13 DIAGNOSIS — E669 Obesity, unspecified: Secondary | ICD-10-CM | POA: Insufficient documentation

## 2015-07-13 DIAGNOSIS — K5790 Diverticulosis of intestine, part unspecified, without perforation or abscess without bleeding: Secondary | ICD-10-CM

## 2015-07-13 DIAGNOSIS — E782 Mixed hyperlipidemia: Secondary | ICD-10-CM | POA: Insufficient documentation

## 2015-07-13 DIAGNOSIS — M7918 Myalgia, other site: Secondary | ICD-10-CM | POA: Insufficient documentation

## 2015-07-13 DIAGNOSIS — M5030 Other cervical disc degeneration, unspecified cervical region: Secondary | ICD-10-CM | POA: Insufficient documentation

## 2015-07-13 DIAGNOSIS — K760 Fatty (change of) liver, not elsewhere classified: Secondary | ICD-10-CM | POA: Insufficient documentation

## 2015-07-13 DIAGNOSIS — M5136 Other intervertebral disc degeneration, lumbar region: Secondary | ICD-10-CM | POA: Insufficient documentation

## 2015-07-13 DIAGNOSIS — R531 Weakness: Secondary | ICD-10-CM

## 2015-07-13 DIAGNOSIS — G894 Chronic pain syndrome: Secondary | ICD-10-CM | POA: Insufficient documentation

## 2015-07-13 DIAGNOSIS — M5382 Other specified dorsopathies, cervical region: Secondary | ICD-10-CM

## 2015-07-13 DIAGNOSIS — M542 Cervicalgia: Secondary | ICD-10-CM

## 2015-07-13 DIAGNOSIS — Z98891 History of uterine scar from previous surgery: Secondary | ICD-10-CM

## 2015-07-13 MED ORDER — METHYLPREDNISOLONE ACETATE 80 MG/ML IJ SUSP
INTRAMUSCULAR | Status: AC
  Administered 2015-07-13: 80 mg via INTRAMUSCULAR
  Filled 2015-07-13: qty 1

## 2015-07-13 MED ORDER — ROPIVACAINE HCL 2 MG/ML IJ SOLN
INTRAMUSCULAR | Status: AC
  Administered 2015-07-13: 9 mL
  Filled 2015-07-13: qty 10

## 2015-07-13 MED ORDER — ROPIVACAINE HCL 2 MG/ML IJ SOLN
9.0000 mL | Freq: Once | INTRAMUSCULAR | Status: AC
  Administered 2015-07-13: 9 mL

## 2015-07-13 MED ORDER — METHYLPREDNISOLONE ACETATE 80 MG/ML IJ SUSP
80.0000 mg | Freq: Once | INTRAMUSCULAR | Status: AC
  Administered 2015-07-13: 80 mg via INTRAMUSCULAR

## 2015-07-13 NOTE — Progress Notes (Signed)
Patient's Name: Gabriela Rice MRN: [id number] DOB: 05-14-1956 DOS: 07/13/2015  Primary Reason(s) for Visit: Interventional Pain Management Treatment. CC: Back Pain   Pre-Procedure Assessment: Ms. Wirsing is a 59 y.o. year old, female patient, seen today for interventional treatment. She has IRRITABLE BOWEL SYNDROME; FATTY LIVER DISEASE; Nonspecific elevation of levels of transaminase or lactic acid dehydrogenase (LDH); PERSONAL HX COLONIC POLYPS; Dyspnea; Hypoxemia, nocturnal only ; Elevated liver function tests; Abdominal pain, right upper quadrant; Nausea alone; Encounter for long-term (current) use of other medications; Multiple sclerosis ([place]); Abnormality of gait; Chronic rhinitis; HBP (high blood pressure); Lumbago; Difficulty in walking(719.7); Right-sided Lumbar Facet Syndrome; Lumbar spondylosis; Chronic low back pain; Trigger point with back pain; Myofascial pain syndrome; Chronic pain; Chronic pain syndrome; Generalized weakness; Right-sided chronic neck pain; Cervical spondylosis; DDD (degenerative disc disease), cervical; Cervical facet syndrome; DDD (degenerative disc disease), lumbar; Pain of right lower extremity; Chronic radicular lumbar pain; Non-insulin dependent type 2 diabetes mellitus ([place]); History of hysterectomy; and History of C-section on her problem list.. Her primarily concern today is the Back Pain Verification of the correct person, correct site (including marking of site), and correct procedure were performed and confirmed by the patient. Today's Vitals   07/13/15 1452 07/13/15 1456 07/13/15 1556 07/13/15 1606  BP: 129/75  124/80 115/85  Pulse: 90  94 93  Temp: 98 F (36.7 C)     TempSrc: Oral     Resp: 18  16 18   Height: 5\' 4"  (1.626 m)     Weight: 200 lb (90.719 kg)     SpO2: 99%  100% 99%  PainSc:  5   1   Calculated BMI: Body mass index is 34.31 kg/(m^2). Allergies: She is allergic to penicillins and codeine.. Primary Diagnosis: Trigger point with back  pain [M54.9]  Procedure: Type: Therapeutic Trigger Point Injection Region: Posterior Lumbar Level: PSIS (Posterior Superior Iliac Spine)   Laterality: Right-Sided Paravertebral  Indications: Myofascial Pain Syndrome Lumbosacral Spine Pain and Sacroiliac Arthralgia  Consent: Secured. Under the influence of no sedatives a written informed consent was obtained, after having provided information on the risks and possible complications. To fulfill our ethical and legal obligations, as recommended by the American Medical Association's Code of Ethics, we have provided information to the patient about our clinical impression; the nature and purpose of the treatment or procedure; the risks, benefits, and possible complications of the intervention; alternatives; the risk(s) and benefit(s) of the alternative treatment(s) or procedure(s); and the risk(s) and benefit(s) of doing nothing. The patient was provided information about the risks and possible complications associated with the procedure. These include, but are not limited to, failure to achieve desired goals, infection, bleeding, organ or nerve damage, allergic reactions, paralysis, and death. In addition, the patient was informed that Medicine is not an exact science; therefore, there is also the possibility of unforeseen risks and possible complications that may result in a catastrophic outcome. The patient indicated having understood very clearly. We have given the patient no guarantees and we have made no promises. Enough time was given to the patient to ask questions, all of which were answered to the patient's satisfaction.  Pre-Procedure Preparation: Safety Precautions: Allergies reviewed. Appropriate site, procedure, and patient were confirmed by following the Joint Commission's Universal Protocol (UP.01.01.01), in the form of a "Time Out". The patient was asked to confirm marked site and procedure, before commencing. The patient was asked about  blood thinners, or active infections, both of which were denied. Patient was assessed for  positional comfort and all pressure points were checked before starting procedure. Monitoring:  As per clinic protocol. Infection Control Precautions: Aseptic technique used. Standard Universal Precautions were taken as recommended by the Department of [facility] for Disease Control and Prevention (CDC). Standard pre-surgical skin prep was conducted. Respiratory hygiene and cough etiquette was practiced. Hand hygiene observed. Safe injection practices and needle disposal techniques followed. SDV (single dose vial) medications used. Medications properly checked for expiration dates and contaminants. Personal protective equipment (PPE) used: Sterile surgical gloves.  Anesthesia, Analgesia, Anxiolysis: Type: Local Anesthesia Local Anesthetic(s): Lidocaine 1% Route: Subcutaneous IV Access: None. Sedation: Declined. Indication(s):Patient declined.  Description of Procedure Process:  Time-out: "Time-out" completed before starting procedure, as per protocol. Position: Sitting Target Area: Trigger Point Approach: Ipsilateral approach. Area Prepped: Entire Lower Lumbosacral Area Prepping solution: ChloraPrep (2% chlorhexidine gluconate and 70% isopropyl alcohol) Safety Precautions: Aspiration looking for blood return was conducted prior to all injections. At no point did we inject any substances, as a needle was being advanced. No attempts were made at seeking any paresthesias. Safe injection practices and needle disposal techniques used. Medications properly checked for expiration dates. SDV (single dose vial) medications used. Description of the Procedure: Protocol guidelines were followed. The patient was placed in position over the fluoroscopy table. The target area was identified and the area prepped in the usual manner. Skin desensitized using vapocoolant spray. Skin & deeper tissues infiltrated with  local anesthetic. Appropriate amount of time allowed to pass for local anesthetics to take effect. The procedure needles were then advanced to the target area. Proper needle placement secured. Negative aspiration confirmed. Solution injected in intermittent fashion, asking for systemic symptoms every 0.5cc of injectate. The needles were then removed and the area cleansed, making sure to leave some of the prepping solution back to take advantage of its long term bactericidal properties. EBL: None Materials & Medications Used:  Needle(s) Used: 25g - 1.5" Needle(s) Medications Administered today: We administered ropivacaine (PF) 2 mg/ml (0.2%), methylPREDNISolone acetate, ropivacaine (PF) 2 mg/ml (0.2%), and methylPREDNISolone acetate.Please see chart orders for dosing details.  Imaging Guidance:  Type of Imaging Technique: None Indication(s): N/A  Antibiotics:  Type:  Antibiotics Given (last 72 hours)    None       Post-operative Assessment:  Complications: No immediate post-procedural complications were observed. Disposition: Return to clinic for follow-up evaluation. The patient tolerated the entire procedure well. The patient was provided with post-procedure discharge instructions, including a section on how to identify potential problems. Should any problems arise concerning this procedure, the patient was given instructions to immediately contact us, at any time, without hesitation. In any case, we plan to contact the patient by telephone for a follow-up status report regarding this interventional procedure. Comments:  No additional relevant information.  Primary Care Physician: Marjorie Smolder, MD Location: [facility] Facility Note by: Kathlen Brunswick. Dossie Arbour, M.D, DABA, DABAPM, DABPM, DABIPP, FIPP  Disclaimer:  Medicine is not an exact science. The only guarantee in medicine is that nothing is guaranteed. It is important to note that the decision to proceed with  this intervention was based on the information collected from the patient. The Data and conclusions were drawn from the patient's questionnaire, the interview, and the physical examination. Because the information was provided in large part by the patient, it cannot be guaranteed that it has not been purposely or unconsciously manipulated. Every effort has been made to obtain as much relevant data as possible for this evaluation.  It is important to note that the conclusions that lead to this procedure are derived in large part from the available data. Always take into account that the treatment will also be dependent on availability of resources and existing treatment guidelines, considered by other Pain Management Practitioners as being common knowledge and practice, at the time of the intervention. For Medico-Legal purposes, it is also important to point out that variation in procedural techniques and pharmacological choices are the acceptable norm. The indications, contraindications, technique, and results of the above procedure should only be interpreted and judged by a Board-Certified Interventional Pain Specialist with extensive familiarity and expertise in the same exact procedure and technique. Attempts at providing opinions without similar or greater experience and expertise than that of the treating physician will be considered as inappropriate and unethical, and shall result in a formal complaint to the state medical board and applicable specialty societies.

## 2015-07-13 NOTE — Patient Instructions (Signed)
Trigger Point Injection Trigger points are areas where you have muscle pain. A trigger point injection is a shot given in the trigger point to relieve that pain. A trigger point might feel like a knot in your muscle. It hurts to press on a trigger point. Sometimes the pain spreads out (radiates) to other parts of the body. For example, pressing on a trigger point in your shoulder might cause pain in your arm or neck. You might have one trigger point. Or, you might have more than one. People often have trigger points in their upper back and lower back. They also occur often in the neck and shoulders. Pain from a trigger point lasts for a long time. It can make it hard to keep moving. You might not be able to do the exercise or physical therapy that could help you deal with the pain. A trigger point injection may help. It does not work for everyone. But, it may relieve your pain for a few days or a few months. A trigger point injection does not cure long-lasting (chronic) pain. LET YOUR CAREGIVER KNOW ABOUT:  Any allergies (especially to latex, lidocaine, or steroids).  Blood-thinning medicines that you take. These drugs can lead to bleeding or bruising after an injection. They include:  Aspirin.  Ibuprofen.  Clopidogrel.  Warfarin.  Other medicines you take. This includes all vitamins, herbs, eyedrops, over-the-counter medicines, and creams.  Use of steroids.  Recent infections.  Past problems with numbing medicines.  Bleeding problems.  Surgeries you have had.  Other health problems. RISKS AND COMPLICATIONS A trigger point injection is a safe treatment. However, problems may develop, such as:  Minor side effects usually go away in 1 to 2 days. These may include:  Soreness.  Bruising.  Stiffness.  More serious problems are rare. But, they may include:  Bleeding under the skin (hematoma).  Skin infection.  Breaking off of the needle under your skin.  Lung  puncture.  The trigger point injection may not work for you. BEFORE THE PROCEDURE You may need to stop taking any medicine that thins your blood. This is to prevent bleeding and bruising. Usually these medicines are stopped several days before the injection. No other preparation is needed. PROCEDURE  A trigger point injection can be given in your caregiver's office or in a clinic. Each injection takes 2 minutes or less.  Your caregiver will feel for trigger points. The caregiver may use a marker to circle the area for the injection.  The skin over the trigger point will be washed with a germ-killing (antiseptic) solution.  The caregiver pinches the spot for the injection.  Then, a very thin needle is used for the shot. You may feel pain or a twitching feeling when the needle enters the trigger point.  A numbing solution may be injected into the trigger point. Sometimes a drug to keep down swelling, redness, and warmth (inflammation) is also injected.  Your caregiver moves the needle around the trigger zone until the tightness and twitching goes away.  After the injection, your caregiver may put gentle pressure over the injection site.  Then it is covered with a bandage. AFTER THE PROCEDURE  You can go right home after the injection.  The bandage can be taken off after a few hours.  You may feel sore and stiff for 1 to 2 days.  Go back to your regular activities slowly. Your caregiver may ask you to stretch your muscles. Do not do anything that takes   extra energy for a few days.  Follow your caregiver's instructions to manage and treat other pain.   This information is not intended to replace advice given to you by your health care provider. Make sure you discuss any questions you have with your health care provider.   Document Released: 09/05/2011 Document Revised: 01/11/2013 Document Reviewed: 09/05/2011 Elsevier Interactive Patient Education 2016 [employer] Pain  Management Discharge Instructions  General Discharge Instructions :  If you need to reach your doctor call: Monday-Friday 8:00 am - 4:00 pm at [phone] or toll free [phone].  After clinic hours [phone] to have operator reach doctor.  Bring all of your medication bottles to all your appointments in the pain clinic.  To cancel or reschedule your appointment with Pain Management please remember to call 24 hours in advance to avoid a fee.  Refer to the educational materials which you have been given on: General Risks, I had my Procedure. Discharge Instructions, Post Sedation.  Post Procedure Instructions:  The drugs you were given will stay in your system until tomorrow, so for the next 24 hours you should not drive, make any legal decisions or drink any alcoholic beverages.  You may eat anything you prefer, but it is better to start with liquids then soups and crackers, and gradually work up to solid foods.  Please notify your doctor immediately if you have any unusual bleeding, trouble breathing or pain that is not related to your normal pain.  Depending on the type of procedure that was done, some parts of your body may feel week and/or numb.  This usually clears up by tonight or the next day.  Walk with the use of an assistive device or accompanied by an adult for the 24 hours.  You may use ice on the affected area for the first 24 hours.  Put ice in a Ziploc bag and cover with a towel and place against area 15 minutes on 15 minutes off.  You may switch to heat after 24 hours.

## 2015-07-13 NOTE — Progress Notes (Signed)
Safety precautions to be maintained throughout the outpatient stay will include: orient to surroundings, keep bed in low position, maintain call bell within reach at all times, provide assistance with transfer out of bed and ambulation.  

## 2015-07-14 ENCOUNTER — Telehealth: Payer: Self-pay | Admitting: *Deleted

## 2015-07-14 NOTE — Telephone Encounter (Signed)
Denies complications post procedure. 

## 2015-07-20 ENCOUNTER — Telehealth: Payer: Self-pay | Admitting: *Deleted

## 2015-07-20 DIAGNOSIS — M5416 Radiculopathy, lumbar region: Secondary | ICD-10-CM | POA: Diagnosis not present

## 2015-07-20 DIAGNOSIS — M549 Dorsalgia, unspecified: Secondary | ICD-10-CM | POA: Diagnosis not present

## 2015-07-20 NOTE — Telephone Encounter (Signed)
Form,[employer] received from [facility] and Dr Jannifer Franklin 07/20/15.

## 2015-07-21 DIAGNOSIS — Z0289 Encounter for other administrative examinations: Secondary | ICD-10-CM

## 2015-07-24 ENCOUNTER — Telehealth: Payer: Self-pay | Admitting: *Deleted

## 2015-07-24 NOTE — Telephone Encounter (Signed)
Form,[employer],received,completed by Dr Jannifer Franklin and Charisse Klinefelter at front desk 07/24/15.

## 2015-07-24 NOTE — Telephone Encounter (Signed)
Form completed, signed and returned to Donna. 

## 2015-07-27 ENCOUNTER — Ambulatory Visit: Payer: BLUE CROSS/BLUE SHIELD

## 2015-08-01 ENCOUNTER — Ambulatory Visit: Payer: BLUE CROSS/BLUE SHIELD | Admitting: Pain Medicine

## 2015-08-02 ENCOUNTER — Other Ambulatory Visit: Payer: Self-pay | Admitting: Neurosurgery

## 2015-08-02 DIAGNOSIS — M5416 Radiculopathy, lumbar region: Secondary | ICD-10-CM

## 2015-08-06 ENCOUNTER — Other Ambulatory Visit: Payer: Self-pay | Admitting: Internal Medicine

## 2015-08-06 ENCOUNTER — Other Ambulatory Visit: Payer: Self-pay | Admitting: Neurology

## 2015-08-07 ENCOUNTER — Other Ambulatory Visit: Payer: Self-pay

## 2015-08-07 MED ORDER — ALPRAZOLAM 1 MG PO TABS: 1.0000 mg | ORAL_TABLET | Freq: Every day | ORAL | Status: DC

## 2015-08-07 NOTE — Telephone Encounter (Signed)
Rx signed and faxed.

## 2015-08-19 ENCOUNTER — Encounter: Payer: Self-pay | Admitting: Gastroenterology

## 2015-08-20 ENCOUNTER — Inpatient Hospital Stay (HOSPITAL_COMMUNITY)
Admission: EM | Admit: 2015-08-20 | Discharge: 2015-08-23 | DRG: 060 | Disposition: A | Discharge status: Rehabilitation Facility | Payer: BLUE CROSS/BLUE SHIELD | Attending: Internal Medicine | Admitting: Internal Medicine

## 2015-08-20 ENCOUNTER — Encounter: Payer: Self-pay | Admitting: Neurology

## 2015-08-20 ENCOUNTER — Telehealth: Payer: Self-pay | Admitting: Neurology

## 2015-08-20 ENCOUNTER — Emergency Department (HOSPITAL_COMMUNITY): Payer: BLUE CROSS/BLUE SHIELD

## 2015-08-20 ENCOUNTER — Encounter (HOSPITAL_COMMUNITY): Payer: Self-pay | Admitting: Emergency Medicine

## 2015-08-20 DIAGNOSIS — R262 Difficulty in walking, not elsewhere classified: Secondary | ICD-10-CM | POA: Diagnosis not present

## 2015-08-20 DIAGNOSIS — R29898 Other symptoms and signs involving the musculoskeletal system: Secondary | ICD-10-CM | POA: Diagnosis not present

## 2015-08-20 DIAGNOSIS — F419 Anxiety disorder, unspecified: Secondary | ICD-10-CM | POA: Diagnosis present

## 2015-08-20 DIAGNOSIS — G35 Multiple sclerosis: Principal | ICD-10-CM | POA: Diagnosis present

## 2015-08-20 DIAGNOSIS — R109 Unspecified abdominal pain: Secondary | ICD-10-CM | POA: Diagnosis not present

## 2015-08-20 DIAGNOSIS — Z6834 Body mass index (BMI) 34.0-34.9, adult: Secondary | ICD-10-CM | POA: Diagnosis not present

## 2015-08-20 DIAGNOSIS — Z87891 Personal history of nicotine dependence: Secondary | ICD-10-CM | POA: Diagnosis not present

## 2015-08-20 DIAGNOSIS — H8113 Benign paroxysmal vertigo, bilateral: Secondary | ICD-10-CM | POA: Diagnosis not present

## 2015-08-20 DIAGNOSIS — Z79899 Other long term (current) drug therapy: Secondary | ICD-10-CM | POA: Diagnosis not present

## 2015-08-20 DIAGNOSIS — E785 Hyperlipidemia, unspecified: Secondary | ICD-10-CM | POA: Diagnosis present

## 2015-08-20 DIAGNOSIS — N390 Urinary tract infection, site not specified: Secondary | ICD-10-CM | POA: Diagnosis not present

## 2015-08-20 DIAGNOSIS — E1140 Type 2 diabetes mellitus with diabetic neuropathy, unspecified: Secondary | ICD-10-CM | POA: Diagnosis present

## 2015-08-20 DIAGNOSIS — R531 Weakness: Secondary | ICD-10-CM | POA: Diagnosis not present

## 2015-08-20 DIAGNOSIS — Z79891 Long term (current) use of opiate analgesic: Secondary | ICD-10-CM

## 2015-08-20 DIAGNOSIS — R404 Transient alteration of awareness: Secondary | ICD-10-CM | POA: Diagnosis not present

## 2015-08-20 DIAGNOSIS — R1084 Generalized abdominal pain: Secondary | ICD-10-CM

## 2015-08-20 DIAGNOSIS — E119 Type 2 diabetes mellitus without complications: Secondary | ICD-10-CM

## 2015-08-20 DIAGNOSIS — Z7982 Long term (current) use of aspirin: Secondary | ICD-10-CM

## 2015-08-20 DIAGNOSIS — Z7984 Long term (current) use of oral hypoglycemic drugs: Secondary | ICD-10-CM | POA: Diagnosis not present

## 2015-08-20 DIAGNOSIS — F411 Generalized anxiety disorder: Secondary | ICD-10-CM | POA: Diagnosis not present

## 2015-08-20 DIAGNOSIS — I10 Essential (primary) hypertension: Secondary | ICD-10-CM | POA: Diagnosis present

## 2015-08-20 DIAGNOSIS — R296 Repeated falls: Secondary | ICD-10-CM | POA: Diagnosis present

## 2015-08-20 DIAGNOSIS — R1013 Epigastric pain: Secondary | ICD-10-CM | POA: Diagnosis not present

## 2015-08-20 DIAGNOSIS — K754 Autoimmune hepatitis: Secondary | ICD-10-CM | POA: Diagnosis present

## 2015-08-20 DIAGNOSIS — R269 Unspecified abnormalities of gait and mobility: Secondary | ICD-10-CM | POA: Diagnosis not present

## 2015-08-20 DIAGNOSIS — D899 Disorder involving the immune mechanism, unspecified: Secondary | ICD-10-CM | POA: Diagnosis present

## 2015-08-20 DIAGNOSIS — R011 Cardiac murmur, unspecified: Secondary | ICD-10-CM | POA: Diagnosis not present

## 2015-08-20 DIAGNOSIS — K5901 Slow transit constipation: Secondary | ICD-10-CM | POA: Diagnosis not present

## 2015-08-20 DIAGNOSIS — M5126 Other intervertebral disc displacement, lumbar region: Secondary | ICD-10-CM | POA: Diagnosis not present

## 2015-08-20 DIAGNOSIS — E860 Dehydration: Secondary | ICD-10-CM | POA: Diagnosis present

## 2015-08-20 DIAGNOSIS — R627 Adult failure to thrive: Secondary | ICD-10-CM | POA: Diagnosis not present

## 2015-08-20 DIAGNOSIS — H8110 Benign paroxysmal vertigo, unspecified ear: Secondary | ICD-10-CM | POA: Diagnosis not present

## 2015-08-20 DIAGNOSIS — R10819 Abdominal tenderness, unspecified site: Secondary | ICD-10-CM | POA: Diagnosis not present

## 2015-08-20 DIAGNOSIS — K5790 Diverticulosis of intestine, part unspecified, without perforation or abscess without bleeding: Secondary | ICD-10-CM | POA: Diagnosis present

## 2015-08-20 LAB — COMPREHENSIVE METABOLIC PANEL
ALT: 58 U/L — ABNORMAL HIGH (ref 14–54)
ANION GAP: 10 (ref 5–15)
AST: 73 U/L — ABNORMAL HIGH (ref 15–41)
Albumin: 4.1 g/dL (ref 3.5–5.0)
Alkaline Phosphatase: 46 U/L (ref 38–126)
BUN: 23 mg/dL — ABNORMAL HIGH (ref 6–20)
CALCIUM: 9.5 mg/dL (ref 8.9–10.3)
CHLORIDE: 106 mmol/L (ref 101–111)
CO2: 25 mmol/L (ref 22–32)
Creatinine, Ser: 0.69 mg/dL (ref 0.44–1.00)
Glucose, Bld: 139 mg/dL — ABNORMAL HIGH (ref 65–99)
Potassium: 3.4 mmol/L — ABNORMAL LOW (ref 3.5–5.1)
SODIUM: 141 mmol/L (ref 135–145)
Total Bilirubin: 1.4 mg/dL — ABNORMAL HIGH (ref 0.3–1.2)
Total Protein: 6.9 g/dL (ref 6.5–8.1)

## 2015-08-20 LAB — CBC
HCT: 35.7 % — ABNORMAL LOW (ref 36.0–46.0)
HEMOGLOBIN: 12.3 g/dL (ref 12.0–15.0)
MCH: 31.1 pg (ref 26.0–34.0)
MCHC: 34.5 g/dL (ref 30.0–36.0)
MCV: 90.4 fL (ref 78.0–100.0)
PLATELETS: 202 10*3/uL (ref 150–400)
RBC: 3.95 MIL/uL (ref 3.87–5.11)
RDW: 14.5 % (ref 11.5–15.5)
WBC: 5.3 10*3/uL (ref 4.0–10.5)

## 2015-08-20 LAB — URINALYSIS, ROUTINE W REFLEX MICROSCOPIC
Bilirubin Urine: NEGATIVE
Glucose, UA: NEGATIVE mg/dL
HGB URINE DIPSTICK: NEGATIVE
Ketones, ur: 15 mg/dL — AB
LEUKOCYTES UA: NEGATIVE
Nitrite: POSITIVE — AB
PROTEIN: NEGATIVE mg/dL
SPECIFIC GRAVITY, URINE: 1.027 (ref 1.005–1.030)
pH: 6 (ref 5.0–8.0)

## 2015-08-20 LAB — URINE MICROSCOPIC-ADD ON: RBC / HPF: NONE SEEN RBC/hpf (ref 0–5)

## 2015-08-20 LAB — LIPASE, BLOOD: LIPASE: 50 U/L (ref 11–51)

## 2015-08-20 MED ORDER — POLYETHYLENE GLYCOL 3350 17 G PO PACK
17.0000 g | PACK | Freq: Every day | ORAL | Status: DC
  Administered 2015-08-20 – 2015-08-22 (×3): 17 g via ORAL
  Filled 2015-08-20 (×3): qty 1

## 2015-08-20 MED ORDER — SODIUM CHLORIDE 0.9 % IV BOLUS (SEPSIS)
1000.0000 mL | Freq: Once | INTRAVENOUS | Status: AC
  Administered 2015-08-20: 1000 mL via INTRAVENOUS

## 2015-08-20 MED ORDER — IOHEXOL 300 MG/ML  SOLN
25.0000 mL | Freq: Once | INTRAMUSCULAR | Status: AC | PRN
  Administered 2015-08-20: 25 mL via ORAL

## 2015-08-20 MED ORDER — ALPRAZOLAM 0.5 MG PO TABS
1.0000 mg | ORAL_TABLET | Freq: Once | ORAL | Status: AC
  Administered 2015-08-20: 1 mg via ORAL
  Filled 2015-08-20: qty 2

## 2015-08-20 MED ORDER — DOCUSATE SODIUM 100 MG PO CAPS
100.0000 mg | ORAL_CAPSULE | Freq: Once | ORAL | Status: AC
  Administered 2015-08-20: 100 mg via ORAL
  Filled 2015-08-20: qty 1

## 2015-08-20 MED ORDER — DICYCLOMINE HCL 10 MG PO CAPS
20.0000 mg | ORAL_CAPSULE | Freq: Once | ORAL | Status: AC
  Administered 2015-08-20: 20 mg via ORAL
  Filled 2015-08-20: qty 2

## 2015-08-20 MED ORDER — IOHEXOL 300 MG/ML  SOLN
100.0000 mL | Freq: Once | INTRAMUSCULAR | Status: AC | PRN
  Administered 2015-08-20: 100 mL via INTRAVENOUS

## 2015-08-20 MED ORDER — MORPHINE SULFATE (PF) 4 MG/ML IV SOLN
4.0000 mg | Freq: Once | INTRAVENOUS | Status: AC
  Administered 2015-08-20: 4 mg via INTRAVENOUS
  Filled 2015-08-20: qty 1

## 2015-08-20 MED ORDER — ONDANSETRON HCL 4 MG/2ML IJ SOLN
4.0000 mg | Freq: Once | INTRAMUSCULAR | Status: AC
  Administered 2015-08-20: 4 mg via INTRAVENOUS
  Filled 2015-08-20: qty 2

## 2015-08-20 NOTE — ED Provider Notes (Signed)
CSN: [id number]     Arrival date & time 08/20/15  1902 History   First MD Initiated Contact with Patient 08/20/15 2002     Chief Complaint  Patient presents with  . Abdominal Pain     (Consider location/radiation/quality/duration/timing/severity/associated sxs/prior Treatment) HPI   Gabriela Rice is a 59 y.o. female, with a history of relapsing-remitting MS and Autoimmune hepatitis, presenting to the ED with flare up of MS for the last two weeks. Pt flare ups usually involve lower extremity weakness, which makes it difficult to walk. Pt also complains of chronic abdominal pain for the past few months accompanied by bloating and constipation. Pt rates the abdominal pain at 7 or 8/10, pain is from the bloated feeling, generalized, non-radiating. Pt had a CT scan in august with no abnormalities. Dr. Jannifer Franklin is patient's neurologist and requested that the pt request a steroid drip while she is here in the ED to treat her relapsed MS. Pt denies N/V/D, fever/chills, chest pain, shortness of breath, problems swallowing, changes in her vision, or any other pain or complaints. Pt is accompanied by her husband and two sons. Pt states that she was diagnosed with autoimmune hepatitis two years ago, found during a routine physical. Pt takes Imuran for this issue.     Past Medical History  Diagnosis Date  . DM (diabetes mellitus) ([place])   . Hyperlipidemia   . Multiple sclerosis ([place])   . Allergic rhinitis   . Elevated LFTs   . Obesity   . Vitamin D deficiency   . Abnormality of gait 02/16/2013  . Colon polyps   . Diverticulosis   . Hemorrhoids   . Hepatitis     with cholestasis  . Hemorrhoids   . Autoimmune hepatitis ([place])   . Allergy   . Hypertension   . Autoimmune hepatitis ([place])   . Anxiety   . Migraines    Past Surgical History  Procedure Laterality Date  . Myoectomy    . Cesarean section      x 2  . Tonsillectomy and adenoidectomy    . Abdominal hysterectomy     Family History   Problem Relation Age of Onset  . Heart disease Maternal Grandmother   . Colon cancer Maternal Aunt   . Uterine cancer Maternal Aunt   . Brain cancer Maternal Uncle   . Diabetes Mother   . Multiple sclerosis Mother   . Cirrhosis Father    Social History  Substance Use Topics  . Smoking status: Former Smoker -- 0.25 packs/day for 4 years    Types: Cigarettes    Quit date: 10/01/1995  . Smokeless tobacco: Never Used  . Alcohol Use: No   OB History    No data available     Review of Systems  Constitutional: Negative for fever, chills, diaphoresis and unexpected weight change.  Respiratory: Negative for cough, chest tightness and shortness of breath.   Cardiovascular: Negative for chest pain, palpitations and leg swelling.  Gastrointestinal: Positive for abdominal pain and constipation. Negative for nausea, vomiting and diarrhea.  Genitourinary: Negative for dysuria and flank pain.  Musculoskeletal: Negative for back pain.  Skin: Negative for color change and pallor.  Neurological: Positive for weakness. Negative for dizziness, syncope and light-headedness.  All other systems reviewed and are negative.     Allergies  Penicillins and Codeine  Home Medications   Prior to Admission medications   Medication Sig Start Date End Date Taking? Authorizing Provider  ALPRAZolam Duanne Moron) 1 MG tablet Take  1 tablet (1 mg total) by mouth at bedtime. 08/07/15  Yes Kathrynn Ducking, MD  aspirin EC 81 MG tablet Take 81 mg by mouth daily.   Yes Historical Provider, MD  azaTHIOprine (IMURAN) 50 MG tablet Take 3 tab at the same time daily. 08/17/14  Yes Amy S Esterwood, PA-C  cetirizine (ZYRTEC) 10 MG tablet Take 10 mg by mouth daily.   Yes Historical Provider, MD  Cholecalciferol (VITAMIN D) 2000 UNITS CAPS Take 2,000 Units by mouth daily.    Yes Historical Provider, MD  COPAXONE 40 MG/ML SOSY INJECT 40 MG (1 ML) UNDER THE SKIN THREE TIMES A WEEK Patient taking differently: Inject 40 mg into  the skin every Monday, Wednesday, and Friday. INJECT 40 MG (1 ML) UNDER THE SKIN THREE TIMES A WEEK 06/16/15  Yes Kathrynn Ducking, MD  escitalopram (LEXAPRO) 10 MG tablet Take 10 mg by mouth daily. 08/14/15  Yes Historical Provider, MD  gabapentin (NEURONTIN) 300 MG capsule Take 1 capsule (300 mg total) by mouth 3 (three) times daily. Patient taking differently: Take 300 mg by mouth daily.  07/06/14  Yes Kathrynn Ducking, MD  ibuprofen (ADVIL,MOTRIN) 200 MG tablet Take 200 mg by mouth every 6 (six) hours as needed for pain.   Yes Historical Provider, MD  losartan (COZAAR) 100 MG tablet Take 100 mg by mouth daily.  09/02/13  Yes Historical Provider, MD  metFORMIN (GLUCOPHAGE) 500 MG tablet Take 500 mg by mouth 2 (two) times daily with a meal.  03/15/13  Yes Historical Provider, MD  ondansetron (ZOFRAN) 4 MG tablet take 1 tablet every 8 hours if needed for nausea   Yes Irene Shipper, MD  ONE TOUCH ULTRA TEST test strip 1 each by Other route daily.  03/15/13  Yes Historical Provider, MD  oxyCODONE-acetaminophen (PERCOCET/ROXICET) 5-325 MG tablet Take 1 tablet by mouth every 6 (six) hours as needed. For pain. 05/23/15  Yes Historical Provider, MD  polyethylene glycol powder (GLYCOLAX/MIRALAX) powder take 17GM (DISSOLVED IN WATER) by mouth three times a day Patient taking differently: take 17GM (DISSOLVED IN WATER) by mouth three times a day as needed for constipation 03/20/15  Yes Irene Shipper, MD  pravastatin (PRAVACHOL) 40 MG tablet Take 1 tablet by mouth daily. 07/04/14  Yes Historical Provider, MD  trimethoprim (TRIMPEX) 100 MG tablet Take 100 mg by mouth daily. 11/09/13  Yes Historical Provider, MD  azaTHIOprine (IMURAN) 50 MG tablet take 3 tablet by mouth once daily 08/07/15   Irene Shipper, MD   BP 152/81 mmHg  Pulse 85  Temp(Src) 98.7 F (37.1 C) (Oral)  Resp 19  Ht 5\' 4"  (1.626 m)  Wt 200 lb (90.719 kg)  BMI 34.31 kg/m2  SpO2 95% Physical Exam  Constitutional: She is oriented to person, place,  and time. She appears well-developed and well-nourished. No distress.  HENT:  Head: Normocephalic and atraumatic.  Eyes: Conjunctivae are normal. Pupils are equal, round, and reactive to light.  Cardiovascular: Normal rate, regular rhythm and normal heart sounds.   Pulmonary/Chest: Effort normal and breath sounds normal. No respiratory distress.  Abdominal: Soft. Bowel sounds are normal.  Musculoskeletal: She exhibits no edema or tenderness.  Neurological: She is alert and oriented to person, place, and time.  Some decreased sensation in lower extremities that pt states is normal. No sensory deficits in upper extremities. Strength 4/5 in both lower extremities. Strength 5/5 in upper extremities. Cranial nerves II-XII grossly intact, specifically, no issues with swallow reflex.  Skin:  Skin is warm and dry. She is not diaphoretic.  Nursing note and vitals reviewed.   ED Course  Procedures (including critical care time) Labs Review Labs Reviewed  COMPREHENSIVE METABOLIC PANEL - Abnormal; Notable for the following:    Potassium 3.4 (*)    Glucose, Bld 139 (*)    BUN 23 (*)    AST 73 (*)    ALT 58 (*)    Total Bilirubin 1.4 (*)    All other components within normal limits  CBC - Abnormal; Notable for the following:    HCT 35.7 (*)    All other components within normal limits  LIPASE, BLOOD  URINALYSIS, ROUTINE W REFLEX MICROSCOPIC (NOT AT [facility])    Imaging Review Ct Abdomen Pelvis W Contrast  08/20/2015  CLINICAL DATA:  Acute onset of epigastric abdominal pain, radiating to the lower back. Leg weakness. Initial encounter. EXAM: CT ABDOMEN AND PELVIS WITH CONTRAST TECHNIQUE: Multidetector CT imaging of the abdomen and pelvis was performed using the standard protocol following bolus administration of intravenous contrast. CONTRAST:  140mL OMNIPAQUE IOHEXOL 300 MG/ML  SOLN COMPARISON:  CT of the abdomen and pelvis from 05/08/2015 FINDINGS: The visualized lung bases are clear. A stable 5 mm  hypodensity is noted within the right hepatic lobe. The liver and spleen are otherwise unremarkable. The gallbladder is within normal limits. The pancreas and adrenal glands are unremarkable. A small 5 mm cyst is noted at the upper pole of the right kidney. The kidneys are otherwise unremarkable. There is no evidence of hydronephrosis. No renal or ureteral stones are seen. No perinephric stranding is appreciated. No free fluid is identified. The small bowel is unremarkable in appearance. The stomach is within normal limits. No acute vascular abnormalities are seen. The appendix is normal in caliber and contains air, without evidence of appendicitis. Scattered diverticulosis is noted along the sigmoid colon, without evidence of diverticulitis. The bladder is mildly distended and grossly unremarkable. The patient is status post hysterectomy. No suspicious adnexal masses are seen. No inguinal lymphadenopathy is seen. Mild chronic subcutaneous inflammation is noted along the anterior abdominal wall. No acute osseous abnormalities are identified. Vacuum phenomenon and endplate degenerative change are noted at L2-L3, and at L5-S1. IMPRESSION: 1. No acute abnormalities seen in the abdomen or pelvis. 2. Tiny hepatic and right renal cysts noted. 3. Scattered diverticulosis along the sigmoid colon, without evidence of diverticulitis. Electronically Signed   By: Garald Balding M.D.   On: 08/20/2015 22:00   I have personally reviewed and evaluated these images and lab results as part of my medical decision-making.   EKG Interpretation None      MDM   Final diagnoses:  Generalized abdominal pain  Multiple sclerosis exacerbation ([place])    Gabriela Rice presents with relapsing MS and abdominal bloating and pain.  Findings and plan of care discussed with Gabriela Belling, MD.  Will need to investigate patient's abdominal pain more closely due to her immunocompromised status. An abdominal CT is indicated. A  single dose of pain medication was provided to take the edge of the patient's pain off. 8:46 PM Spoke with Dr. Brett Fairy, who is the on call neurologist with Dr. Jannifer Franklin' practice. She recommended 500mg  solumedrol for three days and admission via the hospitalist service with inpatient physical therapy. Dr. Alvino Chapel will handle consultation with the hospitalist service. Please see Dr. Edwena Felty note for further information. Patient states that she is feeling nervous and requested a dose of Xanax. Patient feels much better after  administration of Xanax. I plan to hold off on treatment of patient's constipation until after the results of the CT are in hand. Patient CT scan shows diverticulosis without evidence of diverticulitis. Patient's constipation can now be treated. Upon reassessment patient states that she still has some abdominal discomfort and bloating. A bowel regimen was ordered for the patient. 10:25 PM Dr. Alvino Chapel spoke with the hospitalist who agreed to admit the patient to stepdown bed. No further instructions. Patient was informed of the plan for admission and is comfortable with this plan.  Filed Vitals:   08/20/15 1910 08/20/15 1924 08/20/15 1927 08/20/15 2048  BP: 120/80   152/81  Pulse: 73   85  Temp: 98.7 F (37.1 C)     TempSrc: Oral     Resp: 16   19  Height:   5\' 4"  (1.626 m)   Weight:   200 lb (90.719 kg)   SpO2: 97% 99%  95%    Lorayne Bender, PA-C 08/20/15 2239  Gabriela Belling, MD 08/20/15 [phone]

## 2015-08-20 NOTE — H&P (Signed)
[facility] History and Physical  Gabriela Rice [id number] DOB: 06-04-1956 DOA: 08/20/2015  Referring physician: Ms. Caryl Asp. PA. PCP: Marjorie Smolder, MD  Specialists: Dr. Beacher May. Neurologist.  Chief Complaint: Lower extremity weakness.  HPI: Gabriela Rice is a 59 y.o. female with history of multiple sclerosis presents to the ER because of increasing weakness of the lower extremity. Patient states over the last few days patient has been having increasing weakness of the lower extremity exam today patient was unable to walk and had a fall but did not hit her head or lose consciousness. On exam patient has significant weakness of the lower extremity. Denies any incontinence of urine or bowels. In addition patient has been having upper abdominal pain which has been chronic and is following with gastroenterologist. CT abdomen and pelvis is unremarkable. Denies any nausea vomiting or diarrhea. Patient is being admitted for multiple sclerosis exacerbation and patient's neurologist has recommended starting patient on Solu-Medrol as per the notes in Epic.  Review of Systems: As presented in the history of presenting illness, rest negative.  Past Medical History  Diagnosis Date  . DM (diabetes mellitus) ([place])   . Hyperlipidemia   . Multiple sclerosis ([place])   . Allergic rhinitis   . Elevated LFTs   . Obesity   . Vitamin D deficiency   . Abnormality of gait 02/16/2013  . Colon polyps   . Diverticulosis   . Hemorrhoids   . Hepatitis     with cholestasis  . Hemorrhoids   . Autoimmune hepatitis ([place])   . Allergy   . Hypertension   . Autoimmune hepatitis ([place])   . Anxiety   . Migraines    Past Surgical History  Procedure Laterality Date  . Myoectomy    . Cesarean section      x 2  . Tonsillectomy and adenoidectomy    . Abdominal hysterectomy     Social History:  reports that she quit smoking about 19 years ago. Her smoking use included Cigarettes. She has a 1 pack-year  smoking history. She has never used smokeless tobacco. She reports that she does not drink alcohol or use illicit drugs. Where does patient live home. Can patient participate in ADLs? Yes.  Allergies  Allergen Reactions  . Penicillins Other (See Comments)    seizure as child Has patient had a PCN reaction causing immediate rash, facial/tongue/throat swelling, SOB or lightheadedness with hypotension: No Has patient had a PCN reaction causing severe rash involving mucus membranes or skin necrosis:No Has patient had a PCN reaction that required hospitalizationNo Has patient had a PCN reaction occurring within the last 10 years:No If all of the above answers are "NO", then may proceed with Cephalosporin use.    . Codeine Swelling    hands swell    Family History:  Family History  Problem Relation Age of Onset  . Heart disease Maternal Grandmother   . Colon cancer Maternal Aunt   . Uterine cancer Maternal Aunt   . Brain cancer Maternal Uncle   . Diabetes Mother   . Multiple sclerosis Mother   . Cirrhosis Father       Prior to Admission medications   Medication Sig Start Date End Date Taking? Authorizing Provider  ALPRAZolam Duanne Moron) 1 MG tablet Take 1 tablet (1 mg total) by mouth at bedtime. 08/07/15  Yes Kathrynn Ducking, MD  aspirin EC 81 MG tablet Take 81 mg by mouth daily.   Yes Historical Provider, MD  azaTHIOprine (IMURAN) 50 MG  tablet Take 3 tab at the same time daily. 08/17/14  Yes Amy S Esterwood, PA-C  cetirizine (ZYRTEC) 10 MG tablet Take 10 mg by mouth daily.   Yes Historical Provider, MD  Cholecalciferol (VITAMIN D) 2000 UNITS CAPS Take 2,000 Units by mouth daily.    Yes Historical Provider, MD  COPAXONE 40 MG/ML SOSY INJECT 40 MG (1 ML) UNDER THE SKIN THREE TIMES A WEEK Patient taking differently: Inject 40 mg into the skin every Monday, Wednesday, and Friday. INJECT 40 MG (1 ML) UNDER THE SKIN THREE TIMES A WEEK 06/16/15  Yes Kathrynn Ducking, MD  escitalopram (LEXAPRO)  10 MG tablet Take 10 mg by mouth daily. 08/14/15  Yes Historical Provider, MD  gabapentin (NEURONTIN) 300 MG capsule Take 1 capsule (300 mg total) by mouth 3 (three) times daily. Patient taking differently: Take 300 mg by mouth daily.  07/06/14  Yes Kathrynn Ducking, MD  ibuprofen (ADVIL,MOTRIN) 200 MG tablet Take 200 mg by mouth every 6 (six) hours as needed for pain.   Yes Historical Provider, MD  losartan (COZAAR) 100 MG tablet Take 100 mg by mouth daily.  09/02/13  Yes Historical Provider, MD  metFORMIN (GLUCOPHAGE) 500 MG tablet Take 500 mg by mouth 2 (two) times daily with a meal.  03/15/13  Yes Historical Provider, MD  ondansetron (ZOFRAN) 4 MG tablet take 1 tablet every 8 hours if needed for nausea   Yes Irene Shipper, MD  ONE TOUCH ULTRA TEST test strip 1 each by Other route daily.  03/15/13  Yes Historical Provider, MD  oxyCODONE-acetaminophen (PERCOCET/ROXICET) 5-325 MG tablet Take 1 tablet by mouth every 6 (six) hours as needed. For pain. 05/23/15  Yes Historical Provider, MD  polyethylene glycol powder (GLYCOLAX/MIRALAX) powder take 17GM (DISSOLVED IN WATER) by mouth three times a day Patient taking differently: take 17GM (DISSOLVED IN WATER) by mouth three times a day as needed for constipation 03/20/15  Yes Irene Shipper, MD  pravastatin (PRAVACHOL) 40 MG tablet Take 1 tablet by mouth daily. 07/04/14  Yes Historical Provider, MD  trimethoprim (TRIMPEX) 100 MG tablet Take 100 mg by mouth daily. 11/09/13  Yes Historical Provider, MD  azaTHIOprine (IMURAN) 50 MG tablet take 3 tablet by mouth once daily 08/07/15   Irene Shipper, MD    Physical Exam: Filed Vitals:   08/20/15 1927 08/20/15 2048 08/20/15 2100 08/20/15 2130  BP:  152/81 146/86 135/62  Pulse:  85 91   Temp:      TempSrc:      Resp:  19 17 19   Height: 5\' 4"  (1.626 m)     Weight: 90.719 kg (200 lb)     SpO2:  95% 95%      General:  Moderately built and nourished.  Eyes: Anicteric no pallor.  ENT: No discharge. Ears eyes  nose and mouth.  Neck: No mass felt.  Cardiovascular: S1-S2 heard.  Respiratory: No rhonchi or crepitations.  Abdomen: Soft nontender bowel sounds present. No guarding or rigidity.  Skin: No rash.  Musculoskeletal: No edema.  Psychiatric: Appears normal.  Neurologic: Alert awake oriented to time place and person. Moves upper extremity is 5 x 5. Lower extremity is around 1 x 5. No facial asymmetry. Tongue is midline. PERRLA positive.  Labs on Admission:  Basic Metabolic Panel:  Recent Labs Lab 08/20/15 2009  NA 141  K 3.4*  CL 106  CO2 25  GLUCOSE 139*  BUN 23*  CREATININE 0.69  CALCIUM 9.5   Liver  Function Tests:  Recent Labs Lab 08/20/15 2009  AST 73*  ALT 58*  ALKPHOS 46  BILITOT 1.4*  PROT 6.9  ALBUMIN 4.1    Recent Labs Lab 08/20/15 2009  LIPASE 50   No results for input(s): AMMONIA in the last 168 hours. CBC:  Recent Labs Lab 08/20/15 2009  WBC 5.3  HGB 12.3  HCT 35.7*  MCV 90.4  PLT 202   Cardiac Enzymes: No results for input(s): CKTOTAL, CKMB, CKMBINDEX, TROPONINI in the last 168 hours.  BNP (last 3 results) No results for input(s): BNP in the last 8760 hours.  ProBNP (last 3 results) No results for input(s): PROBNP in the last 8760 hours.  CBG: No results for input(s): GLUCAP in the last 168 hours.  Radiological Exams on Admission: Ct Abdomen Pelvis W Contrast  08/20/2015  CLINICAL DATA:  Acute onset of epigastric abdominal pain, radiating to the lower back. Leg weakness. Initial encounter. EXAM: CT ABDOMEN AND PELVIS WITH CONTRAST TECHNIQUE: Multidetector CT imaging of the abdomen and pelvis was performed using the standard protocol following bolus administration of intravenous contrast. CONTRAST:  114mL OMNIPAQUE IOHEXOL 300 MG/ML  SOLN COMPARISON:  CT of the abdomen and pelvis from 05/08/2015 FINDINGS: The visualized lung bases are clear. A stable 5 mm hypodensity is noted within the right hepatic lobe. The liver and spleen are  otherwise unremarkable. The gallbladder is within normal limits. The pancreas and adrenal glands are unremarkable. A small 5 mm cyst is noted at the upper pole of the right kidney. The kidneys are otherwise unremarkable. There is no evidence of hydronephrosis. No renal or ureteral stones are seen. No perinephric stranding is appreciated. No free fluid is identified. The small bowel is unremarkable in appearance. The stomach is within normal limits. No acute vascular abnormalities are seen. The appendix is normal in caliber and contains air, without evidence of appendicitis. Scattered diverticulosis is noted along the sigmoid colon, without evidence of diverticulitis. The bladder is mildly distended and grossly unremarkable. The patient is status post hysterectomy. No suspicious adnexal masses are seen. No inguinal lymphadenopathy is seen. Mild chronic subcutaneous inflammation is noted along the anterior abdominal wall. No acute osseous abnormalities are identified. Vacuum phenomenon and endplate degenerative change are noted at L2-L3, and at L5-S1. IMPRESSION: 1. No acute abnormalities seen in the abdomen or pelvis. 2. Tiny hepatic and right renal cysts noted. 3. Scattered diverticulosis along the sigmoid colon, without evidence of diverticulitis. Electronically Signed   By: Garald Balding M.D.   On: 08/20/2015 22:00    Assessment/Plan Principal Problem:   Multiple sclerosis exacerbation (HCC) Active Problems:   Abdominal pain   Autoimmune hepatitis ([place])   Diabetes mellitus type 2, controlled ([place])   Hypertension   1. Multiple sclerosis exacerbation - at this time patient has significant lower extremity weakness and as recommended by patient's neurologist Dr. Beacher May in the Epic notes patient has been placed on Solu-Medrol 500 mg IV daily. Patient has been placed on neurochecks. Physical therapy consult. May consult with neurologist in a.m. For further recommendation. Patient may need rehabilitation  placement. Patient is also on Capoxone. While patient is on Solu-Medrol patient will be on Protonix IV. 2. Abdominal pain - CT abdomen and pelvis is unremarkable. LFTs are mildly elevated and patient does have a history of autoimmune hepatitis for which patient is on Imuran. Lipase is unremarkable. Closely observe. 3. Diabetes mellitus type 2 - since patient is on Solu-Medrol I have placed patient on moderate dose sliding scale.  If blood sugar tends to get elevated may need long-acting insulin transiently. Closely follow CBGs. 4. Hypertension on Cozaar. 5. Autoimmune hepatitis on Imuran. 6. Hyperlipidemia on statins.  I have reviewed patient's old charts and labs.   DVT Prophylaxis Lovenox.  Code Status: Full code.  Family Communication: Patient's granddaughter at the bedside.  Disposition Plan: Admit to inpatient.    Gabriela Rice N. [facility] Pager [phone].  If 7PM-7AM, please contact night-coverage [email or web address] Password [facility] 08/20/2015, 11:27 PM

## 2015-08-20 NOTE — ED Notes (Signed)
Bed: [id number] Expected date: 08/20/15 Expected time: 7:02 PM Means of arrival: Ambulance Comments: Epigastric and back pain

## 2015-08-20 NOTE — ED Notes (Signed)
Patient transported to CT 

## 2015-08-20 NOTE — ED Notes (Addendum)
Per EMS, patient is from home.  Patient has MS and autoimmune hepatitis.  She is complaining of epigastric pain on both right and left side.  Pain radiates to lower back but it is a chronic issue.  Patient has weakness in legs.  Patient is A & O.  Patient is a diabetic.  Patient states she can't walk (only short distance and with a walker).  Patient states she fell today, she didn't hit head.  No LOC or dizzy spells.   BP: 138/79 P:83 R:18 98% on room air.   CBG: 150

## 2015-08-20 NOTE — ED Notes (Signed)
Patient is on bedpan

## 2015-08-20 NOTE — Telephone Encounter (Signed)
I spoke to Mrs. Eagleton's daughter-in-law today by telephone.  Mrs. Foresman had increasing ambulatory difficulties for the last 72 hours and this afternoon and presented to [facility], ED. Her daughter-in-law called me again after arrival there stating that her family got the impression that the patient would not be admitted also her mother-in-law cannot ambu late. I spoke to a Dr. Georga Kaufmann who asked me to put orders in for the patient. I explained to him that I would not be the admitting attending, and that the patient would need likely 3 days of intravenous Solu-Medrol. the dose is dependent on the patient's blood glucose levels and can be between 500 mg and thousand milligrams daily.  He indicated understanding and will write the admission orders with a Solu-Medrol intravenous therapy. He also wanted to involve inpatient rehabilitation if the patient does not gain ambulatory function within the next 3 days. CD

## 2015-08-20 NOTE — ED Notes (Signed)
Patient is aware we need urine.  She is on a bedpan.

## 2015-08-21 ENCOUNTER — Telehealth: Payer: Self-pay | Admitting: *Deleted

## 2015-08-21 ENCOUNTER — Inpatient Hospital Stay (HOSPITAL_COMMUNITY): Payer: BLUE CROSS/BLUE SHIELD

## 2015-08-21 DIAGNOSIS — R627 Adult failure to thrive: Secondary | ICD-10-CM

## 2015-08-21 DIAGNOSIS — K754 Autoimmune hepatitis: Secondary | ICD-10-CM

## 2015-08-21 DIAGNOSIS — R296 Repeated falls: Secondary | ICD-10-CM

## 2015-08-21 LAB — CBC WITH DIFFERENTIAL/PLATELET
BASOS ABS: 0 10*3/uL (ref 0.0–0.1)
BASOS PCT: 0 %
EOS ABS: 0 10*3/uL (ref 0.0–0.7)
EOS PCT: 0 %
HEMATOCRIT: 37.4 % (ref 36.0–46.0)
Hemoglobin: 12.6 g/dL (ref 12.0–15.0)
Lymphocytes Relative: 12 %
Lymphs Abs: 0.4 10*3/uL — ABNORMAL LOW (ref 0.7–4.0)
MCH: 31 pg (ref 26.0–34.0)
MCHC: 33.7 g/dL (ref 30.0–36.0)
MCV: 92.1 fL (ref 78.0–100.0)
MONO ABS: 0.1 10*3/uL (ref 0.1–1.0)
MONOS PCT: 3 %
NEUTROS ABS: 2.9 10*3/uL (ref 1.7–7.7)
Neutrophils Relative %: 85 %
PLATELETS: 199 10*3/uL (ref 150–400)
RBC: 4.06 MIL/uL (ref 3.87–5.11)
RDW: 14.6 % (ref 11.5–15.5)
WBC: 3.4 10*3/uL — ABNORMAL LOW (ref 4.0–10.5)

## 2015-08-21 LAB — COMPREHENSIVE METABOLIC PANEL
ALBUMIN: 4 g/dL (ref 3.5–5.0)
ALK PHOS: 50 U/L (ref 38–126)
ALT: 59 U/L — AB (ref 14–54)
ANION GAP: 9 (ref 5–15)
AST: 70 U/L — ABNORMAL HIGH (ref 15–41)
BILIRUBIN TOTAL: 1.1 mg/dL (ref 0.3–1.2)
BUN: 13 mg/dL (ref 6–20)
CALCIUM: 9.2 mg/dL (ref 8.9–10.3)
CO2: 25 mmol/L (ref 22–32)
CREATININE: 0.69 mg/dL (ref 0.44–1.00)
Chloride: 106 mmol/L (ref 101–111)
GFR calc Af Amer: >60 mL/min (ref >60)
GFR calc non Af Amer: >60 mL/min (ref >60)
GLUCOSE: 172 mg/dL — AB (ref 65–99)
Potassium: 4.2 mmol/L (ref 3.5–5.1)
Sodium: 140 mmol/L (ref 135–145)
TOTAL PROTEIN: 7 g/dL (ref 6.5–8.1)

## 2015-08-21 LAB — GLUCOSE, CAPILLARY
GLUCOSE-CAPILLARY: 169 mg/dL — AB (ref 65–99)
GLUCOSE-CAPILLARY: 176 mg/dL — AB (ref 65–99)
GLUCOSE-CAPILLARY: 119 mg/dL — AB (ref 65–99)
Glucose-Capillary: 146 mg/dL — ABNORMAL HIGH (ref 65–99)

## 2015-08-21 MED ORDER — CLONAZEPAM 0.5 MG PO TABS
0.2500 mg | ORAL_TABLET | Freq: Three times a day (TID) | ORAL | Status: DC | PRN
  Administered 2015-08-21 – 2015-08-23 (×5): 0.25 mg via ORAL
  Filled 2015-08-21 (×5): qty 1

## 2015-08-21 MED ORDER — ONDANSETRON HCL 4 MG/2ML IJ SOLN: 4.0000 mg | Freq: Three times a day (TID) | INTRAMUSCULAR | Status: DC | PRN

## 2015-08-21 MED ORDER — INSULIN ASPART 100 UNIT/ML ~~LOC~~ SOLN
0.0000 [IU] | Freq: Three times a day (TID) | SUBCUTANEOUS | Status: DC
  Administered 2015-08-21: 3 [IU] via SUBCUTANEOUS
  Administered 2015-08-21: 2 [IU] via SUBCUTANEOUS
  Administered 2015-08-21: 3 [IU] via SUBCUTANEOUS
  Administered 2015-08-22: 8 [IU] via SUBCUTANEOUS
  Administered 2015-08-22: 3 [IU] via SUBCUTANEOUS
  Administered 2015-08-23: 8 [IU] via SUBCUTANEOUS
  Administered 2015-08-23: 5 [IU] via SUBCUTANEOUS

## 2015-08-21 MED ORDER — GABAPENTIN 300 MG PO CAPS
300.0000 mg | ORAL_CAPSULE | Freq: Every day | ORAL | Status: DC
  Administered 2015-08-21 – 2015-08-22 (×2): 300 mg via ORAL
  Filled 2015-08-21 (×5): qty 1

## 2015-08-21 MED ORDER — PRAVASTATIN SODIUM 40 MG PO TABS
40.0000 mg | ORAL_TABLET | Freq: Every day | ORAL | Status: DC
  Administered 2015-08-21 – 2015-08-23 (×3): 40 mg via ORAL
  Filled 2015-08-21 (×3): qty 1

## 2015-08-21 MED ORDER — ONDANSETRON HCL 4 MG PO TABS: 4.0000 mg | ORAL_TABLET | Freq: Four times a day (QID) | ORAL | Status: DC | PRN

## 2015-08-21 MED ORDER — OXYCODONE-ACETAMINOPHEN 5-325 MG PO TABS
1.0000 | ORAL_TABLET | Freq: Four times a day (QID) | ORAL | Status: DC | PRN
  Administered 2015-08-21: 1 via ORAL
  Filled 2015-08-21 (×2): qty 1

## 2015-08-21 MED ORDER — ESCITALOPRAM OXALATE 10 MG PO TABS
10.0000 mg | ORAL_TABLET | Freq: Every day | ORAL | Status: DC
  Administered 2015-08-21 – 2015-08-23 (×3): 10 mg via ORAL
  Filled 2015-08-21 (×3): qty 1

## 2015-08-21 MED ORDER — SODIUM CHLORIDE 0.9 % IV SOLN
1000.0000 mg | INTRAVENOUS | Status: DC
  Administered 2015-08-21 – 2015-08-22 (×2): 1000 mg via INTRAVENOUS
  Filled 2015-08-21 (×3): qty 8

## 2015-08-21 MED ORDER — SODIUM CHLORIDE 0.9 % IV SOLN
INTRAVENOUS | Status: DC
  Administered 2015-08-21: 01:00:00 via INTRAVENOUS

## 2015-08-21 MED ORDER — PANTOPRAZOLE SODIUM 40 MG IV SOLR
40.0000 mg | INTRAVENOUS | Status: DC
  Administered 2015-08-21 – 2015-08-23 (×3): 40 mg via INTRAVENOUS
  Filled 2015-08-21 (×4): qty 40

## 2015-08-21 MED ORDER — ASPIRIN EC 81 MG PO TBEC
81.0000 mg | DELAYED_RELEASE_TABLET | Freq: Every day | ORAL | Status: DC
  Administered 2015-08-21 – 2015-08-23 (×3): 81 mg via ORAL
  Filled 2015-08-21 (×3): qty 1

## 2015-08-21 MED ORDER — TRIMETHOPRIM 100 MG PO TABS
100.0000 mg | ORAL_TABLET | Freq: Every day | ORAL | Status: DC
Start: 2015-08-21 — End: 2015-08-23
  Administered 2015-08-21 – 2015-08-23 (×3): 100 mg via ORAL
  Filled 2015-08-21 (×3): qty 1

## 2015-08-21 MED ORDER — SENNOSIDES-DOCUSATE SODIUM 8.6-50 MG PO TABS
2.0000 | ORAL_TABLET | Freq: Two times a day (BID) | ORAL | Status: DC
  Administered 2015-08-21 – 2015-08-22 (×2): 2 via ORAL
  Filled 2015-08-21 (×5): qty 2

## 2015-08-21 MED ORDER — POLYETHYLENE GLYCOL 3350 17 GM/SCOOP PO POWD
0.5000 | Freq: Once | ORAL | Status: DC
  Filled 2015-08-21: qty 255

## 2015-08-21 MED ORDER — VITAMIN D3 25 MCG (1000 UNIT) PO TABS
2000.0000 [IU] | ORAL_TABLET | Freq: Every day | ORAL | Status: DC
  Administered 2015-08-21 – 2015-08-23 (×3): 2000 [IU] via ORAL
  Filled 2015-08-21 (×3): qty 2

## 2015-08-21 MED ORDER — LORATADINE 10 MG PO TABS
10.0000 mg | ORAL_TABLET | Freq: Every day | ORAL | Status: DC
  Administered 2015-08-21 – 2015-08-23 (×3): 10 mg via ORAL
  Filled 2015-08-21 (×3): qty 1

## 2015-08-21 MED ORDER — LOSARTAN POTASSIUM 50 MG PO TABS
100.0000 mg | ORAL_TABLET | Freq: Every day | ORAL | Status: DC
  Administered 2015-08-21 – 2015-08-23 (×3): 100 mg via ORAL
  Filled 2015-08-21 (×3): qty 2

## 2015-08-21 MED ORDER — AZATHIOPRINE 50 MG PO TABS
150.0000 mg | ORAL_TABLET | Freq: Every day | ORAL | Status: DC
  Administered 2015-08-21 – 2015-08-23 (×3): 150 mg via ORAL
  Filled 2015-08-21 (×4): qty 3

## 2015-08-21 MED ORDER — ALPRAZOLAM 1 MG PO TABS
1.0000 mg | ORAL_TABLET | Freq: Every day | ORAL | Status: DC
  Administered 2015-08-21 – 2015-08-22 (×3): 1 mg via ORAL
  Filled 2015-08-21 (×3): qty 1

## 2015-08-21 MED ORDER — ONDANSETRON HCL 4 MG/2ML IJ SOLN: 4.0000 mg | Freq: Four times a day (QID) | INTRAMUSCULAR | Status: DC | PRN

## 2015-08-21 MED ORDER — ONDANSETRON HCL 4 MG PO TABS: 4.0000 mg | ORAL_TABLET | Freq: Three times a day (TID) | ORAL | Status: DC | PRN

## 2015-08-21 MED ORDER — SODIUM CHLORIDE 0.9 % IV SOLN
500.0000 mg | INTRAVENOUS | Status: DC
  Administered 2015-08-21: 500 mg via INTRAVENOUS
  Filled 2015-08-21: qty 4

## 2015-08-21 MED ORDER — GLATIRAMER ACETATE 40 MG/ML ~~LOC~~ SOSY
40.0000 mg | PREFILLED_SYRINGE | SUBCUTANEOUS | Status: DC
  Administered 2015-08-21 – 2015-08-23 (×2): 40 mg via SUBCUTANEOUS
  Filled 2015-08-21: qty 1

## 2015-08-21 MED ORDER — ENOXAPARIN SODIUM 60 MG/0.6ML ~~LOC~~ SOLN
45.0000 mg | SUBCUTANEOUS | Status: DC
  Administered 2015-08-21 – 2015-08-23 (×3): 45 mg via SUBCUTANEOUS
  Filled 2015-08-21 (×3): qty 0.6

## 2015-08-21 NOTE — Progress Notes (Signed)
PROGRESS NOTE  Gabriela Rice [id number] DOB: Dec 24, 1955 DOA: 08/20/2015 PCP: Marjorie Smolder, MD  HPI/Recap of past 24 hours:  Reports progressive bilateral lower extremity weakness for the last year, increase frequency of falls, C/o being constipated  Assessment/Plan: Principal Problem:   Multiple sclerosis exacerbation ([place]) Active Problems:   Abdominal pain   Autoimmune hepatitis ([place])   Diabetes mellitus type 2, controlled ([place])   Hypertension  MS exacerbation vs progressive decline, patient reports does not feel any different s/p steroid 500mg . Will change to 1000 mg. Patient declined mri but agrees to CT lumber spine.   Progressive weakness/frequent falls: PT/inpatient rehab   Anxiety: xanax qhs and prn klonopin,   Code Status: full  Family Communication: patient and husband  Disposition Plan: inpatient rehab   Consultants:  EDP talked to neurology  Rehab  Procedures:  none  Antibiotics:  none   Objective: BP 128/77 mmHg  Pulse 66  Temp(Src) 98 F (36.7 C) (Oral)  Resp 18  Ht 5\' 4"  (1.626 m)  Wt 200 lb (90.719 kg)  BMI 34.31 kg/m2  SpO2 98%  Intake/Output Summary (Last 24 hours) at 08/21/15 2117 Last data filed at 08/21/15 1035  Gross per 24 hour  Intake 148.17 ml  Output   1125 ml  Net -976.83 ml   Filed Weights   08/20/15 1927  Weight: 200 lb (90.719 kg)    Exam:   General:  NAD  Cardiovascular: RRR, murmur at left upper sternal border  Respiratory: CTABL  Abdomen: Soft/ND/NT, positive BS  Musculoskeletal: No Edema  Neuro: no focal deficit, chronic bilateral lower extremity weakness, foot drops, not able to lift against gravity  Data Reviewed: Basic Metabolic Panel:  Recent Labs Lab 08/20/15 2009 08/21/15 0544  NA 141 140  K 3.4* 4.2  CL 106 106  CO2 25 25  GLUCOSE 139* 172*  BUN 23* 13  CREATININE 0.69 0.69  CALCIUM 9.5 9.2   Liver Function Tests:  Recent Labs Lab 08/20/15 2009 08/21/15 0544    AST 73* 70*  ALT 58* 59*  ALKPHOS 46 50  BILITOT 1.4* 1.1  PROT 6.9 7.0  ALBUMIN 4.1 4.0    Recent Labs Lab 08/20/15 2009  LIPASE 50   No results for input(s): AMMONIA in the last 168 hours. CBC:  Recent Labs Lab 08/20/15 2009 08/21/15 0544  WBC 5.3 3.4*  NEUTROABS  --  2.9  HGB 12.3 12.6  HCT 35.7* 37.4  MCV 90.4 92.1  PLT 202 199   Cardiac Enzymes:   No results for input(s): CKTOTAL, CKMB, CKMBINDEX, TROPONINI in the last 168 hours. BNP (last 3 results) No results for input(s): BNP in the last 8760 hours.  ProBNP (last 3 results) No results for input(s): PROBNP in the last 8760 hours.  CBG:  Recent Labs Lab 08/21/15 0728 08/21/15 1134 08/21/15 1724 08/21/15 2034  GLUCAP 169* 176* 146* 119*    No results found for this or any previous visit (from the past 240 hour(s)).   Studies: Ct Lumbar Spine Wo Contrast  08/21/2015  CLINICAL DATA:  59 year old with current history of multiple sclerosis presenting to the emergency department because of increasing bilateral lower extremity weakness causing the patient to fall earlier today. Patient declined MRI examination. EXAM: CT LUMBAR SPINE WITHOUT CONTRAST TECHNIQUE: Multidetector CT imaging of the lumbar spine was performed without intravenous contrast administration. Multiplanar CT image reconstructions were also generated. COMPARISON:  Bone window images from CT abdomen and pelvis yesterday. Lumbar spine x-rays 07/20/2015. FINDINGS:  Five non rib-bearing lumbar vertebrae demonstrate anatomic alignment on the sagittal reconstructed images. No lumbar spine fractures. Individual disc spaces as follows: T12-L1: No evidence of disc protrusion or extrusion. Widely patent neural foramina. No spinal stenosis. No significant facet arthropathy. L1-2: Minimal circumferential disc bulge without frank disc extrusion. Widely patent neural foramina. No spinal stenosis. No significant facet arthropathy. L2-3: Severe disc space  narrowing and associated endplate hypertrophy. Central disc bulge and left paracentral and foraminal disc extrusion, though this is likely below the exiting left L2 nerve root. No spinal stenosis. Moderate right and mild left facet hypertrophy. L3-4: Mild circumferential disc bulge without frank disc extrusion. Widely patent neural foramina. No spinal stenosis. No significant facet arthropathy. L4-5: Central and right paracentral disc protrusion without frank disc extrusion. Widely patent neural foramina. No spinal stenosis. No significant facet arthropathy. L5-S1: Severe disc space narrowing and associated endplate hypertrophy. Circumferential disc bulge without frank disc extrusion. No spinal stenosis. Neural foramina widely patent. Lateral recesses widely patent. Moderate bilateral facet hypertrophy. IMPRESSION: 1. Left paracentral and left foraminal disc extrusion at L2-3, though this is likely below the exiting left L2 nerve root. 2. No evidence of disc extrusion elsewhere. 3. Widely patent neural foramina throughout the lumbar spine. 4. No spinal stenosis. Electronically Signed   By: Evangeline Dakin M.D.   On: 08/21/2015 18:27   Ct Abdomen Pelvis W Contrast  08/20/2015  CLINICAL DATA:  Acute onset of epigastric abdominal pain, radiating to the lower back. Leg weakness. Initial encounter. EXAM: CT ABDOMEN AND PELVIS WITH CONTRAST TECHNIQUE: Multidetector CT imaging of the abdomen and pelvis was performed using the standard protocol following bolus administration of intravenous contrast. CONTRAST:  169mL OMNIPAQUE IOHEXOL 300 MG/ML  SOLN COMPARISON:  CT of the abdomen and pelvis from 05/08/2015 FINDINGS: The visualized lung bases are clear. A stable 5 mm hypodensity is noted within the right hepatic lobe. The liver and spleen are otherwise unremarkable. The gallbladder is within normal limits. The pancreas and adrenal glands are unremarkable. A small 5 mm cyst is noted at the upper pole of the right kidney.  The kidneys are otherwise unremarkable. There is no evidence of hydronephrosis. No renal or ureteral stones are seen. No perinephric stranding is appreciated. No free fluid is identified. The small bowel is unremarkable in appearance. The stomach is within normal limits. No acute vascular abnormalities are seen. The appendix is normal in caliber and contains air, without evidence of appendicitis. Scattered diverticulosis is noted along the sigmoid colon, without evidence of diverticulitis. The bladder is mildly distended and grossly unremarkable. The patient is status post hysterectomy. No suspicious adnexal masses are seen. No inguinal lymphadenopathy is seen. Mild chronic subcutaneous inflammation is noted along the anterior abdominal wall. No acute osseous abnormalities are identified. Vacuum phenomenon and endplate degenerative change are noted at L2-L3, and at L5-S1. IMPRESSION: 1. No acute abnormalities seen in the abdomen or pelvis. 2. Tiny hepatic and right renal cysts noted. 3. Scattered diverticulosis along the sigmoid colon, without evidence of diverticulitis. Electronically Signed   By: Garald Balding M.D.   On: 08/20/2015 22:00    Scheduled Meds: . ALPRAZolam  1 mg Oral QHS  . aspirin EC  81 mg Oral Daily  . azaTHIOprine  150 mg Oral Daily  . cholecalciferol  2,000 Units Oral Daily  . enoxaparin (LOVENOX) injection  45 mg Subcutaneous Q24H  . escitalopram  10 mg Oral Daily  . gabapentin  300 mg Oral Daily  . Glatiramer Acetate  40 mg Subcutaneous Q M,W,F  . insulin aspart  0-15 Units Subcutaneous TID WC  . loratadine  10 mg Oral Daily  . losartan  100 mg Oral Daily  . methylPREDNISolone (SOLU-MEDROL) injection  1,000 mg Intravenous Q24H  . pantoprazole (PROTONIX) IV  40 mg Intravenous Q24H  . polyethylene glycol  17 g Oral Daily  . polyethylene glycol powder  0.5 Container Oral Once  . pravastatin  40 mg Oral Daily  . senna-docusate  2 tablet Oral BID  . trimethoprim  100 mg Oral  Daily    Continuous Infusions: . sodium chloride 10 mL/hr at 08/21/15 0035     Time spent: 42mins  Faven Watterson MD, PhD  [facility] Pager [phone]. If 7PM-7AM, please contact night-coverage at [email or web address], password [facility] 08/21/2015, 9:17 PM  LOS: 1 day

## 2015-08-21 NOTE — Telephone Encounter (Signed)
How much Lexapro is she on? If it is low dose like 10 mg daily then that is mostly likely fine, but she needs OV follow-up with Dr. Henrene Pastor or APP since she has not been here in a year anyway.          Thank you,        Gabriela Rice        ----- Message -----     From: [name]     Sent: 08/21/2015  8:12 AM      To: Loralie Champagne, PA-C    Subject: Non-Urgent Medical Question                 ----- Message from Hulan Saas, RN sent at 08/21/2015 8:12 AM EST -----            ----- Message from [name] to Loralie Champagne, PA-C sent at 08/19/2015 1:50 PM -----     I have recently started having major anxiety. My family Dr. Prescribed me Lexapro. Is this safe for me with my liver issues?It states not to take with advil if I have liver issues. I started taking this Tuesday and have not taken any Advil yet but I have bad back pain and wonder what I can take. I want to make sure I do not damage my liver but I also need to deal with this anxiety.        Thank you for your time.          [facility] Size     Small Medium Large Extra Extra Large    Gabriela Rice  08/19/2015  Patient Email  MRN:  [id number]        Left a message for patient to call back.

## 2015-08-21 NOTE — Progress Notes (Signed)
Rehab Admissions Coordinator Note:  Patient was screened by [name] for appropriateness for an Inpatient Acute Rehab Consult.  At this time, we are recommending Inpatient Rehab consult.  [name] 08/21/2015, 1:11 PM  I can be reached at [phone].

## 2015-08-21 NOTE — Evaluation (Signed)
Physical Therapy Evaluation Patient Details Name: Gabriela Rice MRN: [id number] DOB: Dec 08, 1955 Today's Date: 08/21/2015   History of Present Illness  Patient is being admitted for multiple sclerosis exacerbation . Had a fall from bed on 08/20/15, has progressive weakness of legs.  Clinical Impression  Pt admitted with above diagnosis. Pt currently with functional limitations due to the deficits listed below (see PT Problem List).  Pt will benefit from skilled PT to increase their independence and safety with mobility to allow discharge to the venue listed below.  patrient does present with R leg weaker than Left. Reports  By end of the day , she drags the R leg.     Follow Up Recommendations CIR;Supervision/Assistance - 24 hour    Equipment Recommendations  None recommended by PT    Recommendations for Other Services Rehab consult     Precautions / Restrictions Precautions Precautions: Fall Precaution Comments: R ankle may invert Restrictions Weight Bearing Restrictions: No      Mobility  Bed Mobility Overal bed mobility: Needs Assistance Bed Mobility: Supine to Sit     Supine to sit: Mod assist     General bed mobility comments: extra time, cues for technique  Transfers Overall transfer level: Needs assistance Equipment used: Rolling walker (2 wheeled) Transfers: Sit to/from [facility] Sit to Stand: Mod assist;+2 safety/equipment Stand pivot transfers: Mod assist;+2 safety/equipment       General transfer comment: close guarding of the R kne and ankle  did stand and take small stweps to recliner  Ambulation/Gait                Stairs            Wheelchair Mobility    Modified Rankin (Stroke Patients Only)       Balance Overall balance assessment: History of Falls;Needs assistance Sitting-balance support: Bilateral upper extremity supported;Feet supported Sitting balance-Leahy Scale: Fair     Standing balance  support: During functional activity;Bilateral upper extremity supported Standing balance-Leahy Scale: Poor                               Pertinent Vitals/Pain Pain Assessment: Faces Faces Pain Scale: Hurts little more Pain Location: back Pain Descriptors / Indicators: Discomfort Pain Intervention(s): Limited activity within patient's tolerance;Monitored during session;Premedicated before session    Home Living Family/patient expects to be discharged to:: Private residence Living Arrangements: Spouse/significant other Available Help at Discharge: Family Type of Home: House Home Access: Stairs to enter Entrance Stairs-Rails: Right Entrance Stairs-Number of Steps: 2 Home Layout: One level Home Equipment: Walker - 4 wheels;Shower seat;Grab bars - tub/shower      Prior Function Level of Independence: Independent with assistive device(s)         Comments: limited distances to waLK.     Hand Dominance        Extremity/Trunk Assessment   Upper Extremity Assessment: Generalized weakness           Lower Extremity Assessment: RLE deficits/detail;LLE deficits/detail RLE Deficits / Details: decreased ankle  dorsiflexion and eversion= 2+, knee extension 3/5, hip flexion 3/5 LLE Deficits / Details: dorsiflexion 3+, knee ext 4/5, hip flexion 3+.  Cervical / Trunk Assessment: Normal  Communication   Communication: No difficulties  Cognition Arousal/Alertness: Awake/alert Behavior During Therapy: WFL for tasks assessed/performed Overall Cognitive Status: Within Functional Limits for tasks assessed  General Comments      Exercises        Assessment/Plan    PT Assessment Patient needs continued PT services  PT Diagnosis Difficulty walking;Generalized weakness;Abnormality of gait   PT Problem List Decreased strength;Decreased activity tolerance;Decreased balance;Decreased mobility;Decreased knowledge of use of DME;Decreased  safety awareness;Decreased knowledge of precautions;Pain;Impaired sensation  PT Treatment Interventions DME instruction;Gait training;Functional mobility training;Therapeutic activities;Therapeutic exercise;Stair training;Neuromuscular re-education;Patient/family education   PT Goals (Current goals can be found in the Care Plan section) Acute Rehab PT Goals Patient Stated Goal: to walk, not fall PT Goal Formulation: With patient Time For Goal Achievement: 09/04/15 Potential to Achieve Goals: Good    Frequency Min 3X/week   Barriers to discharge Decreased caregiver support spouse works days    Co-evaluation               End of Session Equipment Utilized During Treatment: Gait belt Activity Tolerance: Patient tolerated treatment well Patient left: in chair;with call bell/phone within reach;with chair alarm set Nurse Communication: Mobility status         Time: [id number] PT Time Calculation (min) (ACUTE ONLY): 22 min   Charges:   PT Evaluation $Initial PT Evaluation Tier I: 1 Procedure     PT G CodesClaretha Cooper 08/21/2015, 11:49 AM Tresa Endo PT [phone]

## 2015-08-22 ENCOUNTER — Inpatient Hospital Stay (HOSPITAL_COMMUNITY): Payer: BLUE CROSS/BLUE SHIELD

## 2015-08-22 DIAGNOSIS — E1149 Type 2 diabetes mellitus with other diabetic neurological complication: Secondary | ICD-10-CM

## 2015-08-22 DIAGNOSIS — F4323 Adjustment disorder with mixed anxiety and depressed mood: Secondary | ICD-10-CM

## 2015-08-22 DIAGNOSIS — R269 Unspecified abnormalities of gait and mobility: Secondary | ICD-10-CM

## 2015-08-22 DIAGNOSIS — R011 Cardiac murmur, unspecified: Secondary | ICD-10-CM

## 2015-08-22 LAB — GLUCOSE, CAPILLARY
GLUCOSE-CAPILLARY: 113 mg/dL — AB (ref 65–99)
Glucose-Capillary: 187 mg/dL — ABNORMAL HIGH (ref 65–99)
Glucose-Capillary: 271 mg/dL — ABNORMAL HIGH (ref 65–99)

## 2015-08-22 LAB — COMPREHENSIVE METABOLIC PANEL
ALBUMIN: 4 g/dL (ref 3.5–5.0)
ALK PHOS: 48 U/L (ref 38–126)
ALT: 52 U/L (ref 14–54)
AST: 48 U/L — ABNORMAL HIGH (ref 15–41)
Anion gap: 11 (ref 5–15)
BILIRUBIN TOTAL: 1.4 mg/dL — AB (ref 0.3–1.2)
BUN: 22 mg/dL — ABNORMAL HIGH (ref 6–20)
CALCIUM: 9.9 mg/dL (ref 8.9–10.3)
CO2: 25 mmol/L (ref 22–32)
CREATININE: 0.75 mg/dL (ref 0.44–1.00)
Chloride: 104 mmol/L (ref 101–111)
GFR calc non Af Amer: >60 mL/min (ref >60)
GLUCOSE: 204 mg/dL — AB (ref 65–99)
Potassium: 4.1 mmol/L (ref 3.5–5.1)
SODIUM: 140 mmol/L (ref 135–145)
TOTAL PROTEIN: 7 g/dL (ref 6.5–8.1)

## 2015-08-22 LAB — CBC
HEMATOCRIT: 35.1 % — AB (ref 36.0–46.0)
HEMOGLOBIN: 12 g/dL (ref 12.0–15.0)
MCH: 30.8 pg (ref 26.0–34.0)
MCHC: 34.2 g/dL (ref 30.0–36.0)
MCV: 90.2 fL (ref 78.0–100.0)
Platelets: 201 10*3/uL (ref 150–400)
RBC: 3.89 MIL/uL (ref 3.87–5.11)
RDW: 14.5 % (ref 11.5–15.5)
WBC: 5.4 10*3/uL (ref 4.0–10.5)

## 2015-08-22 LAB — TSH: TSH: 0.266 u[IU]/mL — ABNORMAL LOW (ref 0.350–4.500)

## 2015-08-22 LAB — MAGNESIUM: Magnesium: 2.1 mg/dL (ref 1.7–2.4)

## 2015-08-22 NOTE — Progress Notes (Signed)
Rehab admissions - Noted patient refusing CIR or SNF.  Will follow progress for now.  Call me for questions.  [phone]

## 2015-08-22 NOTE — Care Management Note (Signed)
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  Also informed patient that insurance may not pay for services, & wait for [place]  to have the d/c plan in place especially if we have already offered the choices if medical stability comes first.Patient voiced understanding.CIR coordinator will talk to patient again.                  Action/Plan:d/c plan CIR.   Expected Discharge Date:                  Expected Discharge Plan:  [place]  In-House Referral:     Discharge planning Services  CM Consult  Post Acute Care Choice:    Choice offered to:     DME Arranged:    DME Agency:     HH Arranged:    [place] Agency:     Status of Service:  In process, will continue to follow  Medicare Important Message Given:    Date Medicare IM Given:    Medicare IM give by:    Date Additional Medicare IM Given:    Additional Medicare Important Message give by:     If discussed at [place] of Stay Meetings, dates discussed:    Additional Comments:  Dessa Phi, RN 08/22/2015, 2:58 PM

## 2015-08-22 NOTE — Clinical Social Work Note (Signed)
Patient is currently refusing CIR and SNF placement.  Patient adamant about returning home.  Medical team aware of this request and refusal.  Nonnie Done, LCSW [phone]  [place] Social Worker

## 2015-08-22 NOTE — Consult Note (Signed)
Physical Medicine and Rehabilitation Consult  Reason for Consult: MS exacerbation Referring Physician: Dr. Erlinda Hong   HPI: Gabriela Rice is a 59 y.o. female with history of DM type 2, autoimmune hepatitis, morbid obesity, anxiety disorder, lumbar spondylosis, MS who was admitted on 11/20 with three day history of increase in BLE weakness, frequent falls and progressive gait problems as well as worsening of abdominal pain.  She was started on IV solumedrol for MS exacerbation and CT abdomen/pelvis was negative for diverticulitis or acute abnormalities. Patient declined MRI but was agreeable for CT lumbar spine which revealed left paracentral and foraminal disc extrusion L2/3 likely below L2 nerve root and no spinal stenosis. PT evaluation done yesterday and patient limited by RLE weakness with balance deficits and pain. CIR recommended by MD and rehab team.     Review of Systems  Eyes: Positive for blurred vision.  Gastrointestinal: Positive for abdominal pain and constipation.  Genitourinary: Positive for urgency (with incontinece) and frequency.  Musculoskeletal: Positive for back pain (chronic low back with radiation to right leg), joint pain (chronic right hip and right knee pain), falls and neck pain.  Neurological: Positive for tingling, sensory change and focal weakness (BLE).  Psychiatric/Behavioral: The patient is nervous/anxious.   All other systems reviewed and are negative.   Past Medical History  Diagnosis Date  . DM (diabetes mellitus) ([place])   . Hyperlipidemia   . Multiple sclerosis ([place])   . Allergic rhinitis   . Elevated LFTs   . Obesity   . Vitamin D deficiency   . Abnormality of gait 02/16/2013  . Colon polyps   . Diverticulosis   . Hemorrhoids   . Hepatitis     with cholestasis  . Hemorrhoids   . Autoimmune hepatitis ([place])   . Allergy   . Hypertension   . Autoimmune hepatitis ([place])   . Anxiety   . Migraines     Past Surgical History  Procedure  Laterality Date  . Myoectomy    . Cesarean section      x 2  . Tonsillectomy and adenoidectomy    . Abdominal hysterectomy      Family History  Problem Relation Age of Onset  . Heart disease Maternal Grandmother   . Colon cancer Maternal Aunt   . Uterine cancer Maternal Aunt   . Brain cancer Maternal Uncle   . Diabetes Mother   . Multiple sclerosis Mother   . Cirrhosis Father     Social History:  Married. Independent with cane PTA.  She lives in a 1 story home with 2 steps to enter.  Reports that she quit smoking about 19 years ago. Her smoking use included Cigarettes. She has a 1 pack-year smoking history. She has never used smokeless tobacco. She reports that she does not drink alcohol or use illicit drugs.     Allergies  Allergen Reactions  . Penicillins Other (See Comments)    seizure as child Has patient had a PCN reaction causing immediate rash, facial/tongue/throat swelling, SOB or lightheadedness with hypotension: No Has patient had a PCN reaction causing severe rash involving mucus membranes or skin necrosis:No Has patient had a PCN reaction that required hospitalizationNo Has patient had a PCN reaction occurring within the last 10 years:No If all of the above answers are "NO", then may proceed with Cephalosporin use.    . Codeine Swelling    hands swell   Medications Prior to Admission  Medication Sig Dispense Refill  .  ALPRAZolam (XANAX) 1 MG tablet Take 1 tablet (1 mg total) by mouth at bedtime. 90 tablet 3  . aspirin EC 81 MG tablet Take 81 mg by mouth daily.    Marland Kitchen azaTHIOprine (IMURAN) 50 MG tablet Take 3 tab at the same time daily. 90 tablet 11  . cetirizine (ZYRTEC) 10 MG tablet Take 10 mg by mouth daily.    . Cholecalciferol (VITAMIN D) 2000 UNITS CAPS Take 2,000 Units by mouth daily.     Marland Kitchen COPAXONE 40 MG/ML SOSY INJECT 40 MG (1 ML) UNDER THE SKIN THREE TIMES A WEEK (Patient taking differently: Inject 40 mg into the skin every Monday, Wednesday, and Friday.  INJECT 40 MG (1 ML) UNDER THE SKIN THREE TIMES A WEEK) 36 Syringe 1  . escitalopram (LEXAPRO) 10 MG tablet Take 10 mg by mouth daily.  0  . gabapentin (NEURONTIN) 300 MG capsule Take 1 capsule (300 mg total) by mouth 3 (three) times daily. (Patient taking differently: Take 300 mg by mouth daily. ) 270 capsule 2  . ibuprofen (ADVIL,MOTRIN) 200 MG tablet Take 200 mg by mouth every 6 (six) hours as needed for pain.    Marland Kitchen losartan (COZAAR) 100 MG tablet Take 100 mg by mouth daily.     . metFORMIN (GLUCOPHAGE) 500 MG tablet Take 500 mg by mouth 2 (two) times daily with a meal.     . ondansetron (ZOFRAN) 4 MG tablet take 1 tablet every 8 hours if needed for nausea 30 tablet 0  . ONE TOUCH ULTRA TEST test strip 1 each by Other route daily.     Marland Kitchen oxyCODONE-acetaminophen (PERCOCET/ROXICET) 5-325 MG tablet Take 1 tablet by mouth every 6 (six) hours as needed. For pain.  0  . polyethylene glycol powder (GLYCOLAX/MIRALAX) powder take 17GM (DISSOLVED IN WATER) by mouth three times a day (Patient taking differently: take 17GM (DISSOLVED IN WATER) by mouth three times a day as needed for constipation) 527 g 3  . pravastatin (PRAVACHOL) 40 MG tablet Take 1 tablet by mouth daily.    Marland Kitchen trimethoprim (TRIMPEX) 100 MG tablet Take 100 mg by mouth daily.    Marland Kitchen azaTHIOprine (IMURAN) 50 MG tablet take 3 tablet by mouth once daily 90 tablet 0    Home: Home Living Family/patient expects to be discharged to:: Private residence Living Arrangements: Spouse/significant other Available Help at Discharge: Family Type of Home: House Home Access: Stairs to enter Technical brewer of Steps: 2 Entrance Stairs-Rails: Right Home Layout: One level Bathroom Shower/Tub: [place]: Environmental consultant - 4 wheels, Shower seat, Grab bars - tub/shower  Functional History: Prior Function Level of Independence: Independent with assistive device(s) Comments: limited distances to waLK. Functional Status:  Mobility: Bed  Mobility Overal bed mobility: Needs Assistance Bed Mobility: Supine to Sit Supine to sit: Mod assist General bed mobility comments: extra time, cues for technique Transfers Overall transfer level: Needs assistance Equipment used: Rolling walker (2 wheeled) Transfers: Sit to/from Stand, Stand Pivot Transfers Sit to Stand: Mod assist, +2 safety/equipment Stand pivot transfers: Mod assist, +2 safety/equipment General transfer comment: close guarding of the R kne and ankle  did stand and take small stweps to recliner      ADL:    Cognition: Cognition Overall Cognitive Status: Within Functional Limits for tasks assessed Cognition Arousal/Alertness: Awake/alert Behavior During Therapy: WFL for tasks assessed/performed Overall Cognitive Status: Within Functional Limits for tasks assessed  Blood pressure 117/65, pulse 82, temperature 97.8 F (36.6 C), temperature source Oral, resp. rate 18,  height 5\' 4"  (1.626 m), weight 90.719 kg (200 lb), SpO2 95 %. Physical Exam  Vitals reviewed. Constitutional: She is oriented to person, place, and time. She appears well-developed and well-nourished. No distress.  HENT:  Head: Normocephalic and atraumatic.  Eyes: Conjunctivae and EOM are normal.  Neck: Normal range of motion. Neck supple.  Cardiovascular: Normal rate and regular rhythm.   No murmur heard. Respiratory: Effort normal and breath sounds normal.  GI: Soft. Bowel sounds are normal.  Musculoskeletal: She exhibits no edema or tenderness.  Neurological: She is alert and oriented to person, place, and time.  Motor: B/l UE 5/5 RLE: hip flexion, knee ext 2-/5, ankle dorsi/plantar flexion 3+/5 LLE: hip flexion, knee ext 2/5, ankle dorsi/plantar flexion 4-/5 Difficulty with fine motor tasks  Skin: Skin is warm and dry.  Psychiatric: She has a normal mood and affect. Her behavior is normal. Judgment normal.    Results for orders placed or performed during the hospital encounter of  08/20/15 (from the past 24 hour(s))  Glucose, capillary     Status: Abnormal   Collection Time: 08/21/15 11:34 AM  Result Value Ref Range   Glucose-Capillary 176 (H) 65 - 99 mg/dL  Glucose, capillary     Status: Abnormal   Collection Time: 08/21/15  5:24 PM  Result Value Ref Range   Glucose-Capillary 146 (H) 65 - 99 mg/dL  Glucose, capillary     Status: Abnormal   Collection Time: 08/21/15  8:34 PM  Result Value Ref Range   Glucose-Capillary 119 (H) 65 - 99 mg/dL  CBC     Status: Abnormal   Collection Time: 08/22/15  5:40 AM  Result Value Ref Range   WBC 5.4 4.0 - 10.5 K/uL   RBC 3.89 3.87 - 5.11 MIL/uL   Hemoglobin 12.0 12.0 - 15.0 g/dL   HCT 35.1 (L) 36.0 - 46.0 %   MCV 90.2 78.0 - 100.0 fL   MCH 30.8 26.0 - 34.0 pg   MCHC 34.2 30.0 - 36.0 g/dL   RDW 14.5 11.5 - 15.5 %   Platelets 201 150 - 400 K/uL  Comprehensive metabolic panel     Status: Abnormal   Collection Time: 08/22/15  5:40 AM  Result Value Ref Range   Sodium 140 135 - 145 mmol/L   Potassium 4.1 3.5 - 5.1 mmol/L   Chloride 104 101 - 111 mmol/L   CO2 25 22 - 32 mmol/L   Glucose, Bld 204 (H) 65 - 99 mg/dL   BUN 22 (H) 6 - 20 mg/dL   Creatinine, Ser 0.75 0.44 - 1.00 mg/dL   Calcium 9.9 8.9 - 10.3 mg/dL   Total Protein 7.0 6.5 - 8.1 g/dL   Albumin 4.0 3.5 - 5.0 g/dL   AST 48 (H) 15 - 41 U/L   ALT 52 14 - 54 U/L   Alkaline Phosphatase 48 38 - 126 U/L   Total Bilirubin 1.4 (H) 0.3 - 1.2 mg/dL   GFR calc non Af Amer >60 >60 mL/min   GFR calc Af Amer >60 >60 mL/min   Anion gap 11 5 - 15  Magnesium     Status: None   Collection Time: 08/22/15  5:40 AM  Result Value Ref Range   Magnesium 2.1 1.7 - 2.4 mg/dL  TSH     Status: Abnormal   Collection Time: 08/22/15  5:42 AM  Result Value Ref Range   TSH 0.266 (L) 0.350 - 4.500 uIU/mL   Ct Lumbar Spine Wo Contrast  08/21/2015  CLINICAL DATA:  59 year old with current history of multiple sclerosis presenting to the emergency department because of increasing  bilateral lower extremity weakness causing the patient to fall earlier today. Patient declined MRI examination. EXAM: CT LUMBAR SPINE WITHOUT CONTRAST TECHNIQUE: Multidetector CT imaging of the lumbar spine was performed without intravenous contrast administration. Multiplanar CT image reconstructions were also generated. COMPARISON:  Bone window images from CT abdomen and pelvis yesterday. Lumbar spine x-rays 07/20/2015. FINDINGS: Five non rib-bearing lumbar vertebrae demonstrate anatomic alignment on the sagittal reconstructed images. No lumbar spine fractures. Individual disc spaces as follows: T12-L1: No evidence of disc protrusion or extrusion. Widely patent neural foramina. No spinal stenosis. No significant facet arthropathy. L1-2: Minimal circumferential disc bulge without frank disc extrusion. Widely patent neural foramina. No spinal stenosis. No significant facet arthropathy. L2-3: Severe disc space narrowing and associated endplate hypertrophy. Central disc bulge and left paracentral and foraminal disc extrusion, though this is likely below the exiting left L2 nerve root. No spinal stenosis. Moderate right and mild left facet hypertrophy. L3-4: Mild circumferential disc bulge without frank disc extrusion. Widely patent neural foramina. No spinal stenosis. No significant facet arthropathy. L4-5: Central and right paracentral disc protrusion without frank disc extrusion. Widely patent neural foramina. No spinal stenosis. No significant facet arthropathy. L5-S1: Severe disc space narrowing and associated endplate hypertrophy. Circumferential disc bulge without frank disc extrusion. No spinal stenosis. Neural foramina widely patent. Lateral recesses widely patent. Moderate bilateral facet hypertrophy. IMPRESSION: 1. Left paracentral and left foraminal disc extrusion at L2-3, though this is likely below the exiting left L2 nerve root. 2. No evidence of disc extrusion elsewhere. 3. Widely patent neural foramina  throughout the lumbar spine. 4. No spinal stenosis. Electronically Signed   By: Evangeline Dakin M.D.   On: 08/21/2015 18:27   Ct Abdomen Pelvis W Contrast  08/20/2015  CLINICAL DATA:  Acute onset of epigastric abdominal pain, radiating to the lower back. Leg weakness. Initial encounter. EXAM: CT ABDOMEN AND PELVIS WITH CONTRAST TECHNIQUE: Multidetector CT imaging of the abdomen and pelvis was performed using the standard protocol following bolus administration of intravenous contrast. CONTRAST:  128mL OMNIPAQUE IOHEXOL 300 MG/ML  SOLN COMPARISON:  CT of the abdomen and pelvis from 05/08/2015 FINDINGS: The visualized lung bases are clear. A stable 5 mm hypodensity is noted within the right hepatic lobe. The liver and spleen are otherwise unremarkable. The gallbladder is within normal limits. The pancreas and adrenal glands are unremarkable. A small 5 mm cyst is noted at the upper pole of the right kidney. The kidneys are otherwise unremarkable. There is no evidence of hydronephrosis. No renal or ureteral stones are seen. No perinephric stranding is appreciated. No free fluid is identified. The small bowel is unremarkable in appearance. The stomach is within normal limits. No acute vascular abnormalities are seen. The appendix is normal in caliber and contains air, without evidence of appendicitis. Scattered diverticulosis is noted along the sigmoid colon, without evidence of diverticulitis. The bladder is mildly distended and grossly unremarkable. The patient is status post hysterectomy. No suspicious adnexal masses are seen. No inguinal lymphadenopathy is seen. Mild chronic subcutaneous inflammation is noted along the anterior abdominal wall. No acute osseous abnormalities are identified. Vacuum phenomenon and endplate degenerative change are noted at L2-L3, and at L5-S1. IMPRESSION: 1. No acute abnormalities seen in the abdomen or pelvis. 2. Tiny hepatic and right renal cysts noted. 3. Scattered diverticulosis  along the sigmoid colon, without evidence of diverticulitis. Electronically Signed   By:  Garald Balding M.D.   On: 08/20/2015 22:00    Assessment/Plan: Diagnosis: MS exacerbation 1. Does the need for close, 24 hr/day medical supervision in concert with the patient's rehab needs make it unreasonable for this patient to be served in a less intensive setting? Potentially 2. Co-Morbidities requiring supervision/potential complications:  DM type 2 with neuropathy (Monitor in accordance with exercise and adjust meds as necessary), autoimmune hepatitis (avoid hepatotoxic meds when possible), morbid obesity (Diet and exercise education, cont to encourage weight loss to increase endurance and promote overall health), anxiety (ensure anxiety and resulting apprehension do not limit functional progress; consider prn medications if warranted) lumbar spondylosis, HTN (monitor and provide prns in accordance with increased physical exertion and pain) 3. Due to bladder management, bowel management, safety, disease management, medication administration and patient education, does the patient require 24 hr/day rehab nursing? Potentially 4. Does the patient require coordinated care of a physician, rehab nurse, PT (1.5-2 hrs/day, 5 days/week) and OT (1.5-2 hrs/day, 5 days/week) to address physical and functional deficits in the context of the above medical diagnosis(es)? Potentially Addressing deficits in the following areas: balance, endurance, locomotion, strength, transferring, bowel/bladder control, bathing, dressing, toileting and psychosocial support 5. Can the patient actively participate in an intensive therapy program of at least 3 hrs of therapy per day at least 5 days per week? Yes 6. The potential for patient to make measurable gains while on inpatient rehab is good 7. Anticipated functional outcomes upon discharge from inpatient rehab are supervision and min assist  with PT, modified independent and supervision  with OT, n/a with SLP. 8. Estimated rehab length of stay to reach the above functional goals is: 12-15 days. 9. Does the patient have adequate social supports and living environment to accommodate these discharge functional goals? Potentially 10. Anticipated D/C setting: Home 11. Anticipated post D/C treatments: HH therapy and Home excercise program 12. Overall Rehab/Functional Prognosis: good  RECOMMENDATIONS: This patient's condition is appropriate for continued rehabilitative care in the following setting: Pt refusing IRF services at present.  It is also unclear if the patient has 24/7 support at discharge.  If the pt does have support, keeping in mind pt's wishes, would recommend home with [facility].  Patient has agreed to participate in recommended program. No Note that insurance prior authorization may be required for reimbursement for recommended care.  Delice Lesch, MD 08/22/2015

## 2015-08-22 NOTE — Telephone Encounter (Signed)
Left a message for patient to call back. 

## 2015-08-22 NOTE — Progress Notes (Signed)
  Echocardiogram 2D Echocardiogram has been performed.  Gabriela Rice 08/22/2015, 10:46 AM

## 2015-08-22 NOTE — Progress Notes (Signed)
Initial Nutrition Assessment  DOCUMENTATION CODES:   Obesity unspecified  INTERVENTION:  - Continue Heart Healthy/Carb Modified diet - RD will continue to monitor for needs  NUTRITION DIAGNOSIS:   Inadequate oral intake related to acute illness, poor appetite as evidenced by per patient/family report.  GOAL:   Patient will meet greater than or equal to 90% of their needs  MONITOR:   PO intake, Weight trends, Labs, Skin, I & O's  REASON FOR ASSESSMENT:   Malnutrition Screening Tool  ASSESSMENT:   58 y.o. female with history of multiple sclerosis presents to the ER because of increasing weakness of the lower extremity. Patient states over the last few days patient has been having increasing weakness of the lower extremity exam today patient was unable to walk and had a fall but did not hit her head or lose consciousness. On exam patient has significant weakness of the lower extremity. Denies any incontinence of urine or bowels. In addition patient has been having upper abdominal pain which has been chronic and is following with gastroenterologist. CT abdomen and pelvis is unremarkable. Denies any nausea vomiting or diarrhea. Patient is being admitted for multiple sclerosis exacerbation and patient's neurologist has recommended starting patient on Solu-Medrol as per the notes in Epic.  Pt seen for MST. BMI indicates obesity. Pt states she ate 50% of a bagel and a piece of [place] sausage for breakfast this AM. She states this is more than she has been eating recently as appetite has been decreased x2 weeks. She feels this may have been at least partly related to anxiety. She also states ongoing abdominal discomfort and some nausea, which would become worse with PO intakes, during this 2 week period. Pt states that appetite is now returning.  She states that BMs are not regular and that her last BM was 11/19. It is usual for her to go a few days without a BM. Pt is unsure of UBW or recent  weight fluctuations but feels she has lost weight in the past 2 weeks due to poor appetite. Per chart review, weight has been stable x9 months; pt is on steroids which may be affecting weight in some manner. Did not detect muscle or fat wasting at this time but will continue to monitor.  She denies chewing or swallowing issues. Likely not meeting needs recently. Will monitor for intakes and need for nutrition supplements. Medications reviewed. Labs reviewed; CBGs: 119-176 mg/dL.   Diet Order:  Diet heart healthy/carb modified Room service appropriate?: Yes; Fluid consistency:: Thin  Skin:  Reviewed, no issues  Last BM:  11/19  Height:   Ht Readings from Last 1 Encounters:  08/20/15 5\' 4"  (1.626 m)    Weight:   Wt Readings from Last 1 Encounters:  08/20/15 200 lb (90.719 kg)    Ideal Body Weight:  54.54 kg (kg)  BMI:  Body mass index is 34.31 kg/(m^2).  Estimated Nutritional Needs:   Kcal:  1700-1900  Protein:  65-75 grams  Fluid:  2 L/day  EDUCATION NEEDS:   No education needs identified at this time     Jarome Matin, RD, LDN Inpatient Clinical Dietitian Pager # [phone] After hours/weekend pager # [phone]

## 2015-08-22 NOTE — Progress Notes (Signed)
PROGRESS NOTE  Gabriela Rice [id number] DOB: 1956/07/10 DOA: 08/20/2015 PCP: Marjorie Smolder, MD  HPI/Recap of past 24 hours:  Feeling stronger   Assessment/Plan: Principal Problem:   Multiple sclerosis exacerbation ([place]) Active Problems:   Abdominal pain   Autoimmune hepatitis ([place])   Diabetes mellitus type 2, controlled ([place])   Hypertension  MS exacerbation vs progressive decline, patient reports does not feel any different s/p steroid 500mg . Will change to 1000 mg. Patient declined mri but agrees to CT lumber spine. Ct lumber spine with chronic findings, no cord compromise. Feeling better , not able to lift leg against gravity. Continue solumedrol 1000mg  qd for another day.  Progressive weakness/frequent falls: PT/inpatient rehab   Anxiety: xanax qhs and prn klonopin,   Code Status: full  Family Communication: patient and husband  Disposition Plan: inpatient rehab   Consultants:  EDP talked to neurology  Rehab  Procedures:  none  Antibiotics:  none   Objective: BP 131/60 mmHg  Pulse 74  Temp(Src) 97.7 F (36.5 C) (Oral)  Resp 18  Ht 5\' 4"  (1.626 m)  Wt 200 lb (90.719 kg)  BMI 34.31 kg/m2  SpO2 98%  Intake/Output Summary (Last 24 hours) at 08/22/15 1859 Last data filed at 08/22/15 1700  Gross per 24 hour  Intake    778 ml  Output    501 ml  Net    277 ml   Filed Weights   08/20/15 1927  Weight: 200 lb (90.719 kg)    Exam:   General:  NAD  Cardiovascular: RRR, murmur at left upper sternal border  Respiratory: CTABL  Abdomen: Soft/ND/NT, positive BS  Musculoskeletal: No Edema   Neuro: no focal deficit, chronic bilateral lower extremity weakness, foot drops,  able to lift against gravity today  Data Reviewed: Basic Metabolic Panel:  Recent Labs Lab 08/20/15 2009 08/21/15 0544 08/22/15 0540  NA 141 140 140  K 3.4* 4.2 4.1  CL 106 106 104  CO2 25 25 25   GLUCOSE 139* 172* 204*  BUN 23* 13 22*  CREATININE 0.69  0.69 0.75  CALCIUM 9.5 9.2 9.9  MG  --   --  2.1   Liver Function Tests:  Recent Labs Lab 08/20/15 2009 08/21/15 0544 08/22/15 0540  AST 73* 70* 48*  ALT 58* 59* 52  ALKPHOS 46 50 48  BILITOT 1.4* 1.1 1.4*  PROT 6.9 7.0 7.0  ALBUMIN 4.1 4.0 4.0    Recent Labs Lab 08/20/15 2009  LIPASE 50   No results for input(s): AMMONIA in the last 168 hours. CBC:  Recent Labs Lab 08/20/15 2009 08/21/15 0544 08/22/15 0540  WBC 5.3 3.4* 5.4  NEUTROABS  --  2.9  --   HGB 12.3 12.6 12.0  HCT 35.7* 37.4 35.1*  MCV 90.4 92.1 90.2  PLT 202 199 201   Cardiac Enzymes:   No results for input(s): CKTOTAL, CKMB, CKMBINDEX, TROPONINI in the last 168 hours. BNP (last 3 results) No results for input(s): BNP in the last 8760 hours.  ProBNP (last 3 results) No results for input(s): PROBNP in the last 8760 hours.  CBG:  Recent Labs Lab 08/21/15 1724 08/21/15 2034 08/22/15 0829 08/22/15 1147 08/22/15 1655  GLUCAP 146* 119* 187* 271* 113*    No results found for this or any previous visit (from the past 240 hour(s)).   Studies: No results found.  Scheduled Meds: . ALPRAZolam  1 mg Oral QHS  . aspirin EC  81 mg Oral Daily  . azaTHIOprine  150 mg Oral Daily  . cholecalciferol  2,000 Units Oral Daily  . enoxaparin (LOVENOX) injection  45 mg Subcutaneous Q24H  . escitalopram  10 mg Oral Daily  . gabapentin  300 mg Oral Daily  . Glatiramer Acetate  40 mg Subcutaneous Q M,W,F  . insulin aspart  0-15 Units Subcutaneous TID WC  . loratadine  10 mg Oral Daily  . losartan  100 mg Oral Daily  . methylPREDNISolone (SOLU-MEDROL) injection  1,000 mg Intravenous Q24H  . pantoprazole (PROTONIX) IV  40 mg Intravenous Q24H  . polyethylene glycol  17 g Oral Daily  . polyethylene glycol powder  0.5 Container Oral Once  . pravastatin  40 mg Oral Daily  . senna-docusate  2 tablet Oral BID  . trimethoprim  100 mg Oral Daily    Continuous Infusions: . sodium chloride 10 mL/hr at 08/21/15  0035     Time spent: 38mins  Levar Fayson MD, PhD  [facility] Pager [phone]. If 7PM-7AM, please contact night-coverage at [email or web address], password [email or web address] 08/22/2015, 6:59 PM  LOS: 2 days

## 2015-08-23 ENCOUNTER — Inpatient Hospital Stay (HOSPITAL_COMMUNITY)
Admission: RE | Admit: 2015-08-23 | Discharge: 2015-09-08 | DRG: 059 | Disposition: A | Discharge status: Home Health | Payer: BLUE CROSS/BLUE SHIELD | Source: Intra-hospital | Attending: Physical Medicine & Rehabilitation | Admitting: Physical Medicine & Rehabilitation

## 2015-08-23 DIAGNOSIS — F4323 Adjustment disorder with mixed anxiety and depressed mood: Secondary | ICD-10-CM | POA: Diagnosis present

## 2015-08-23 DIAGNOSIS — E785 Hyperlipidemia, unspecified: Secondary | ICD-10-CM | POA: Diagnosis present

## 2015-08-23 DIAGNOSIS — Z90710 Acquired absence of both cervix and uterus: Secondary | ICD-10-CM

## 2015-08-23 DIAGNOSIS — R1013 Epigastric pain: Secondary | ICD-10-CM

## 2015-08-23 DIAGNOSIS — B952 Enterococcus as the cause of diseases classified elsewhere: Secondary | ICD-10-CM | POA: Diagnosis present

## 2015-08-23 DIAGNOSIS — I10 Essential (primary) hypertension: Secondary | ICD-10-CM | POA: Diagnosis not present

## 2015-08-23 DIAGNOSIS — K754 Autoimmune hepatitis: Secondary | ICD-10-CM | POA: Diagnosis present

## 2015-08-23 DIAGNOSIS — Z7984 Long term (current) use of oral hypoglycemic drugs: Secondary | ICD-10-CM | POA: Diagnosis not present

## 2015-08-23 DIAGNOSIS — R29898 Other symptoms and signs involving the musculoskeletal system: Secondary | ICD-10-CM | POA: Insufficient documentation

## 2015-08-23 DIAGNOSIS — H8110 Benign paroxysmal vertigo, unspecified ear: Secondary | ICD-10-CM

## 2015-08-23 DIAGNOSIS — Z885 Allergy status to narcotic agent status: Secondary | ICD-10-CM | POA: Diagnosis not present

## 2015-08-23 DIAGNOSIS — F411 Generalized anxiety disorder: Secondary | ICD-10-CM | POA: Diagnosis not present

## 2015-08-23 DIAGNOSIS — M4716 Other spondylosis with myelopathy, lumbar region: Secondary | ICD-10-CM | POA: Insufficient documentation

## 2015-08-23 DIAGNOSIS — E1142 Type 2 diabetes mellitus with diabetic polyneuropathy: Secondary | ICD-10-CM | POA: Diagnosis present

## 2015-08-23 DIAGNOSIS — Z79899 Other long term (current) drug therapy: Secondary | ICD-10-CM | POA: Diagnosis not present

## 2015-08-23 DIAGNOSIS — E1140 Type 2 diabetes mellitus with diabetic neuropathy, unspecified: Secondary | ICD-10-CM | POA: Diagnosis present

## 2015-08-23 DIAGNOSIS — Z7982 Long term (current) use of aspirin: Secondary | ICD-10-CM | POA: Diagnosis not present

## 2015-08-23 DIAGNOSIS — E860 Dehydration: Secondary | ICD-10-CM | POA: Diagnosis not present

## 2015-08-23 DIAGNOSIS — G35 Multiple sclerosis: Principal | ICD-10-CM | POA: Diagnosis present

## 2015-08-23 DIAGNOSIS — R262 Difficulty in walking, not elsewhere classified: Secondary | ICD-10-CM | POA: Diagnosis present

## 2015-08-23 DIAGNOSIS — H8113 Benign paroxysmal vertigo, bilateral: Secondary | ICD-10-CM | POA: Diagnosis not present

## 2015-08-23 DIAGNOSIS — R269 Unspecified abnormalities of gait and mobility: Secondary | ICD-10-CM

## 2015-08-23 DIAGNOSIS — Z880 Allergy status to penicillin: Secondary | ICD-10-CM | POA: Diagnosis not present

## 2015-08-23 DIAGNOSIS — K5901 Slow transit constipation: Secondary | ICD-10-CM | POA: Diagnosis not present

## 2015-08-23 DIAGNOSIS — K5900 Constipation, unspecified: Secondary | ICD-10-CM | POA: Diagnosis not present

## 2015-08-23 DIAGNOSIS — N390 Urinary tract infection, site not specified: Secondary | ICD-10-CM | POA: Diagnosis not present

## 2015-08-23 DIAGNOSIS — Z87891 Personal history of nicotine dependence: Secondary | ICD-10-CM | POA: Diagnosis not present

## 2015-08-23 LAB — T4, FREE: Free T4: 1.14 ng/dL — ABNORMAL HIGH (ref 0.61–1.12)

## 2015-08-23 LAB — CBC
HCT: 37.7 % (ref 36.0–46.0)
Hemoglobin: 12.9 g/dL (ref 12.0–15.0)
MCH: 31.5 pg (ref 26.0–34.0)
MCHC: 34.2 g/dL (ref 30.0–36.0)
MCV: 92.2 fL (ref 78.0–100.0)
PLATELETS: 226 10*3/uL (ref 150–400)
RBC: 4.09 MIL/uL (ref 3.87–5.11)
RDW: 14.7 % (ref 11.5–15.5)
WBC: 6.2 10*3/uL (ref 4.0–10.5)

## 2015-08-23 LAB — COMPREHENSIVE METABOLIC PANEL
ALBUMIN: 3.9 g/dL (ref 3.5–5.0)
ALT: 67 U/L — AB (ref 14–54)
AST: 82 U/L — AB (ref 15–41)
Alkaline Phosphatase: 49 U/L (ref 38–126)
Anion gap: 11 (ref 5–15)
BUN: 31 mg/dL — AB (ref 6–20)
CHLORIDE: 102 mmol/L (ref 101–111)
CO2: 26 mmol/L (ref 22–32)
CREATININE: 0.86 mg/dL (ref 0.44–1.00)
Calcium: 9.5 mg/dL (ref 8.9–10.3)
GFR calc Af Amer: >60 mL/min (ref >60)
GLUCOSE: 236 mg/dL — AB (ref 65–99)
POTASSIUM: 4.2 mmol/L (ref 3.5–5.1)
Sodium: 139 mmol/L (ref 135–145)
Total Bilirubin: 0.8 mg/dL (ref 0.3–1.2)
Total Protein: 7.1 g/dL (ref 6.5–8.1)

## 2015-08-23 LAB — GLUCOSE, CAPILLARY
GLUCOSE-CAPILLARY: 210 mg/dL — AB (ref 65–99)
GLUCOSE-CAPILLARY: 160 mg/dL — AB (ref 65–99)
Glucose-Capillary: 124 mg/dL — ABNORMAL HIGH (ref 65–99)
Glucose-Capillary: 262 mg/dL — ABNORMAL HIGH (ref 65–99)
Glucose-Capillary: 161 mg/dL — ABNORMAL HIGH (ref 65–99)

## 2015-08-23 LAB — HEMOGLOBIN A1C
Hgb A1c MFr Bld: 6.1 % — ABNORMAL HIGH (ref 4.8–5.6)
MEAN PLASMA GLUCOSE: 128 mg/dL

## 2015-08-23 LAB — MAGNESIUM: MAGNESIUM: 2.2 mg/dL (ref 1.7–2.4)

## 2015-08-23 MED ORDER — INSULIN ASPART 100 UNIT/ML ~~LOC~~ SOLN
0.0000 [IU] | Freq: Three times a day (TID) | SUBCUTANEOUS | Status: DC
  Administered 2015-08-24 (×3): 5 [IU] via SUBCUTANEOUS
  Administered 2015-08-25: 2 [IU] via SUBCUTANEOUS
  Administered 2015-08-25: 5 [IU] via SUBCUTANEOUS
  Administered 2015-08-25: 3 [IU] via SUBCUTANEOUS
  Administered 2015-08-26: 2 [IU] via SUBCUTANEOUS
  Administered 2015-08-26 – 2015-08-27 (×3): 3 [IU] via SUBCUTANEOUS
  Administered 2015-08-27: 2 [IU] via SUBCUTANEOUS
  Administered 2015-08-27 – 2015-08-29 (×3): 3 [IU] via SUBCUTANEOUS
  Administered 2015-08-31 – 2015-09-02 (×3): 2 [IU] via SUBCUTANEOUS
  Administered 2015-09-02: 3 [IU] via SUBCUTANEOUS
  Administered 2015-09-03 – 2015-09-08 (×5): 2 [IU] via SUBCUTANEOUS

## 2015-08-23 MED ORDER — SENNOSIDES-DOCUSATE SODIUM 8.6-50 MG PO TABS
2.0000 | ORAL_TABLET | Freq: Two times a day (BID) | ORAL | Status: DC
  Administered 2015-08-24 – 2015-09-02 (×17): 2 via ORAL
  Filled 2015-08-23 (×22): qty 2

## 2015-08-23 MED ORDER — CLONAZEPAM 0.5 MG PO TABS
0.2500 mg | ORAL_TABLET | Freq: Three times a day (TID) | ORAL | Status: DC | PRN
  Administered 2015-08-24 (×2): 0.25 mg via ORAL
  Filled 2015-08-23 (×2): qty 1

## 2015-08-23 MED ORDER — PREDNISONE 10 MG PO TABS
10.0000 mg | ORAL_TABLET | Freq: Every day | ORAL | Status: AC
  Administered 2015-08-29: 10 mg via ORAL
  Filled 2015-08-23: qty 1

## 2015-08-23 MED ORDER — GLATIRAMER ACETATE 40 MG/ML ~~LOC~~ SOSY
40.0000 mg | PREFILLED_SYRINGE | SUBCUTANEOUS | Status: DC
  Administered 2015-08-25 – 2015-09-06 (×6): 40 mg via SUBCUTANEOUS
  Filled 2015-08-23: qty 1

## 2015-08-23 MED ORDER — ENOXAPARIN SODIUM 30 MG/0.3ML ~~LOC~~ SOLN: 30.0000 mg | SUBCUTANEOUS | Status: DC

## 2015-08-23 MED ORDER — ONDANSETRON HCL 4 MG PO TABS
4.0000 mg | ORAL_TABLET | Freq: Four times a day (QID) | ORAL | Status: DC | PRN
  Administered 2015-08-31 – 2015-09-04 (×9): 4 mg via ORAL
  Filled 2015-08-23 (×9): qty 1

## 2015-08-23 MED ORDER — GUAIFENESIN-DM 100-10 MG/5ML PO SYRP: 5.0000 mL | ORAL_SOLUTION | Freq: Four times a day (QID) | ORAL | Status: DC | PRN

## 2015-08-23 MED ORDER — ALUM & MAG HYDROXIDE-SIMETH 200-200-20 MG/5ML PO SUSP: 30.0000 mL | ORAL | Status: DC | PRN

## 2015-08-23 MED ORDER — PREDNISONE 20 MG PO TABS
60.0000 mg | ORAL_TABLET | Freq: Every day | ORAL | Status: DC
  Administered 2015-08-24: 60 mg via ORAL
  Filled 2015-08-23 (×2): qty 3

## 2015-08-23 MED ORDER — PANTOPRAZOLE SODIUM 40 MG IV SOLR
40.0000 mg | INTRAVENOUS | Status: DC
  Administered 2015-08-24: 40 mg via INTRAVENOUS
  Filled 2015-08-23 (×2): qty 40

## 2015-08-23 MED ORDER — LOSARTAN POTASSIUM 50 MG PO TABS
100.0000 mg | ORAL_TABLET | Freq: Every day | ORAL | Status: DC
  Administered 2015-08-24 – 2015-09-08 (×16): 100 mg via ORAL
  Filled 2015-08-23 (×17): qty 2

## 2015-08-23 MED ORDER — GABAPENTIN 300 MG PO CAPS
300.0000 mg | ORAL_CAPSULE | Freq: Every day | ORAL | Status: DC
  Administered 2015-08-23 – 2015-09-07 (×16): 300 mg via ORAL
  Filled 2015-08-23 (×16): qty 1

## 2015-08-23 MED ORDER — PREDNISONE 20 MG PO TABS
50.0000 mg | ORAL_TABLET | Freq: Every day | ORAL | Status: AC
  Administered 2015-08-25: 50 mg via ORAL
  Filled 2015-08-23: qty 2

## 2015-08-23 MED ORDER — FLEET ENEMA 7-19 GM/118ML RE ENEM: 1.0000 | ENEMA | Freq: Once | RECTAL | Status: DC | PRN

## 2015-08-23 MED ORDER — BISACODYL 10 MG RE SUPP
10.0000 mg | Freq: Every day | RECTAL | Status: DC | PRN
  Administered 2015-08-30: 10 mg via RECTAL
  Filled 2015-08-23: qty 1

## 2015-08-23 MED ORDER — OXYCODONE-ACETAMINOPHEN 5-325 MG PO TABS: 1.0000 | ORAL_TABLET | Freq: Four times a day (QID) | ORAL | Status: DC | PRN

## 2015-08-23 MED ORDER — MORPHINE SULFATE 15 MG PO TABS: 15.0000 mg | ORAL_TABLET | ORAL | Status: DC | PRN

## 2015-08-23 MED ORDER — METHOCARBAMOL 500 MG PO TABS: 500.0000 mg | ORAL_TABLET | Freq: Four times a day (QID) | ORAL | Status: DC | PRN

## 2015-08-23 MED ORDER — METFORMIN HCL 500 MG PO TABS
500.0000 mg | ORAL_TABLET | Freq: Two times a day (BID) | ORAL | Status: DC
  Administered 2015-08-23 – 2015-09-08 (×32): 500 mg via ORAL
  Filled 2015-08-23 (×33): qty 1

## 2015-08-23 MED ORDER — ALUM & MAG HYDROXIDE-SIMETH 200-200-20 MG/5ML PO SUSP
30.0000 mL | ORAL | Status: DC | PRN
  Administered 2015-08-23: 30 mL via ORAL
  Filled 2015-08-23: qty 30

## 2015-08-23 MED ORDER — ALPRAZOLAM 0.5 MG PO TABS
1.0000 mg | ORAL_TABLET | Freq: Every day | ORAL | Status: DC
  Administered 2015-08-23 – 2015-09-07 (×16): 1 mg via ORAL
  Filled 2015-08-23: qty 2
  Filled 2015-08-23 (×8): qty 4
  Filled 2015-08-23 (×2): qty 2
  Filled 2015-08-23 (×2): qty 4
  Filled 2015-08-23: qty 2
  Filled 2015-08-23 (×2): qty 4

## 2015-08-23 MED ORDER — PREDNISONE 20 MG PO TABS
20.0000 mg | ORAL_TABLET | Freq: Every day | ORAL | Status: AC
  Administered 2015-08-28: 20 mg via ORAL
  Filled 2015-08-23: qty 1

## 2015-08-23 MED ORDER — PRO-STAT SUGAR FREE PO LIQD
30.0000 mL | Freq: Two times a day (BID) | ORAL | Status: DC
  Filled 2015-08-23: qty 30

## 2015-08-23 MED ORDER — ESCITALOPRAM OXALATE 10 MG PO TABS
10.0000 mg | ORAL_TABLET | Freq: Every day | ORAL | Status: DC
  Administered 2015-08-24 – 2015-09-08 (×16): 10 mg via ORAL
  Filled 2015-08-23 (×16): qty 1

## 2015-08-23 MED ORDER — LORATADINE 10 MG PO TABS
10.0000 mg | ORAL_TABLET | Freq: Every day | ORAL | Status: DC
  Administered 2015-08-24 – 2015-09-08 (×16): 10 mg via ORAL
  Filled 2015-08-23 (×16): qty 1

## 2015-08-23 MED ORDER — SODIUM CHLORIDE 0.9 % IV SOLN
1000.0000 mg | INTRAVENOUS | Status: AC
  Administered 2015-08-23: 1000 mg via INTRAVENOUS
  Filled 2015-08-23: qty 8

## 2015-08-23 MED ORDER — POLYETHYLENE GLYCOL 3350 17 G PO PACK
17.0000 g | PACK | Freq: Every day | ORAL | Status: DC
  Administered 2015-08-24 – 2015-09-03 (×9): 17 g via ORAL
  Filled 2015-08-23 (×11): qty 1

## 2015-08-23 MED ORDER — DIPHENHYDRAMINE HCL 12.5 MG/5ML PO ELIX: 12.5000 mg | ORAL_SOLUTION | Freq: Four times a day (QID) | ORAL | Status: DC | PRN

## 2015-08-23 MED ORDER — ENSURE ENLIVE PO LIQD: 237.0000 mL | Freq: Two times a day (BID) | ORAL | Status: DC

## 2015-08-23 MED ORDER — PANTOPRAZOLE SODIUM 40 MG PO TBEC: 40.0000 mg | DELAYED_RELEASE_TABLET | Freq: Every day | ORAL | Status: DC

## 2015-08-23 MED ORDER — PREDNISONE 20 MG PO TABS
30.0000 mg | ORAL_TABLET | Freq: Every day | ORAL | Status: AC
  Administered 2015-08-27: 30 mg via ORAL
  Filled 2015-08-23: qty 1

## 2015-08-23 MED ORDER — ONDANSETRON HCL 4 MG/2ML IJ SOLN: 4.0000 mg | Freq: Four times a day (QID) | INTRAMUSCULAR | Status: DC | PRN

## 2015-08-23 MED ORDER — AZATHIOPRINE 50 MG PO TABS
150.0000 mg | ORAL_TABLET | Freq: Every day | ORAL | Status: DC
  Administered 2015-08-24 – 2015-09-08 (×16): 150 mg via ORAL
  Filled 2015-08-23 (×17): qty 3

## 2015-08-23 MED ORDER — PRAVASTATIN SODIUM 40 MG PO TABS
40.0000 mg | ORAL_TABLET | Freq: Every day | ORAL | Status: DC
  Administered 2015-08-24 – 2015-09-08 (×16): 40 mg via ORAL
  Filled 2015-08-23 (×2): qty 1
  Filled 2015-08-23 (×5): qty 2
  Filled 2015-08-23: qty 1
  Filled 2015-08-23 (×5): qty 2
  Filled 2015-08-23: qty 1
  Filled 2015-08-23 (×2): qty 2

## 2015-08-23 MED ORDER — PREDNISONE 20 MG PO TABS
40.0000 mg | ORAL_TABLET | Freq: Every day | ORAL | Status: AC
  Administered 2015-08-26: 40 mg via ORAL
  Filled 2015-08-23: qty 2

## 2015-08-23 MED ORDER — TRIMETHOPRIM 100 MG PO TABS
100.0000 mg | ORAL_TABLET | Freq: Every day | ORAL | Status: DC
  Administered 2015-08-24 – 2015-08-26 (×3): 100 mg via ORAL
  Filled 2015-08-23 (×6): qty 1

## 2015-08-23 MED ORDER — ASPIRIN EC 81 MG PO TBEC
81.0000 mg | DELAYED_RELEASE_TABLET | Freq: Every day | ORAL | Status: DC
  Administered 2015-08-24 – 2015-09-08 (×16): 81 mg via ORAL
  Filled 2015-08-23 (×16): qty 1

## 2015-08-23 MED ORDER — VITAMIN D3 25 MCG (1000 UNIT) PO TABS
2000.0000 [IU] | ORAL_TABLET | Freq: Every day | ORAL | Status: DC
  Administered 2015-08-24 – 2015-09-08 (×16): 2000 [IU] via ORAL
  Filled 2015-08-23 (×33): qty 2

## 2015-08-23 MED ORDER — PREDNISONE 10 MG PO TABS: ORAL_TABLET | ORAL | Status: DC

## 2015-08-23 NOTE — IPOC Note (Deleted)
Overall Plan of Care [facility]) Patient Details Name: Gabriela Rice MRN: [id number] DOB: Jun 23, 1956  Admitting Diagnosis: MS Exacerbation  Hospital Problems: Principal Problem:   Multiple sclerosis exacerbation ([place]) Active Problems:   Weakness of both legs   Essential hypertension   Type 2 diabetes mellitus with diabetic neuropathy (HCC)   Morbid obesity ([place])   Generalized anxiety disorder   Lumbar spondylosis with myelopathy   Dehydration   Chronic UTI     Functional Problem List: Nursing Bladder, Bowel, Endurance, Motor, Nutrition, Safety, Pain  PT Balance, Endurance, Motor, Pain, Safety, Sensory  OT Balance, Endurance, Pain, Motor, Safety  SLP    TR         Basic ADL's: OT Grooming, Bathing, Dressing, Toileting     Advanced  ADL's: OT Simple Meal Preparation, Light Housekeeping     Transfers: PT Bed Mobility, Bed to Chair, Car, Manufacturing systems engineer, Metallurgist: PT Ambulation, Emergency planning/management officer, Stairs     Additional Impairments: OT None  SLP        TR      Anticipated Outcomes Item Anticipated Outcome  Self Feeding Mod I  Swallowing      Basic self-care  Mod I  Toileting  Mod I   Bathroom Transfers Mod I  Bowel/Bladder  Continent to bowel and bladder with Mod.assisst.  Transfers  mod I  Locomotion  mod I w/c; supervision ambulation  Communication     Cognition     Pain  Less than 3, on 1 to 10 scale.  Safety/Judgment  Free from falls .   Therapy Plan: PT Intensity: Minimum of 1-2 x/day ,45 to 90 minutes PT Frequency: 5 out of 7 days PT Duration Estimated Length of Stay: 2 weeks OT Intensity: Minimum of 1-2 x/day, 45 to 90 minutes OT Frequency: 5 out of 7 days OT Duration/Estimated Length of Stay: 12-15 days         Team Interventions: Nursing Interventions Patient/Family Education, Bladder Management, Bowel Management, Disease Management/Prevention, Medication Management  PT interventions Ambulation/gait  training, DME/adaptive equipment instruction, Psychosocial support, UE/LE Strength taining/ROM, Medical illustrator training, UE/LE Coordination activities, Functional mobility training, Splinting/orthotics, Academic librarian, Neuromuscular re-education, IT trainer, Wheelchair propulsion/positioning, Discharge planning, Pain management, Therapeutic Activities, Disease management/prevention, Barrister's clerk education, Therapeutic Exercise  OT Interventions Training and development officer, DME/adaptive equipment instruction, Functional mobility training, Pain management, Self Care/advanced ADL retraining, Therapeutic Exercise, Therapeutic Activities, Wheelchair propulsion/positioning, Neuromuscular re-education, Patient/family education, Discharge planning, UE/LE Strength taining/ROM  SLP Interventions    TR Interventions    SW/CM Interventions Discharge Planning, Psychosocial Support, Patient/Family Education    Team Discharge Planning: Destination: PT-Home ,OT- Home , SLP-  Projected Follow-up: PT-Home health PT, OT-   , SLP-  Projected Equipment Needs: PT- , OT- To be determined, SLP-  Equipment Details: PT-Pt already owns a tempurpedic bed with head/leg adjustability and R bedrail; 4WW; RW; and transport chair, OT-  Patient/family involved in discharge planning: PT- Patient,  OT-Patient, SLP-   MD ELOS: 12-15 days. Medical Rehab Prognosis:  Good Assessment: 59 y.o. female with history of DM type 2, autoimmune hepatitis, morbid obesity, anxiety disorder, lumbar spondylosis, MS who was admitted on 11/20 with three day history of increase in BLE weakness, frequent falls and progressive gait problems as well as worsening of abdominal pain. Patient declined MRI but was agreeable for CT lumbar spine which revealed left paracentral and foraminal disc extrusion L2/3 likely below L2 nerve root and no spinal stenosis. She completes IV steroidsand was started  on prednisone taper. Therapy evaluations  done and patient continues to be limited by RLE weakness, balance deficits and requires cues for safety.   See Team Conference Notes for weekly updates to the plan of care

## 2015-08-23 NOTE — Progress Notes (Signed)
Occupational Therapy Evaluation Patient Details Name: Gabriela Rice MRN: [id number] DOB: May 06, 1956 Today's Date: 08/23/2015    History of Present Illness Patient is being admitted for multiple sclerosis exacerbation . Had a fall from bed on 08/20/15, has progressive weakness of legs.   Clinical Impression   Pt admitted with the above diagnoses and presents with below problem list. Pt will benefit from continued OT to address the below listed deficits and maximize independence with BADLs prior to d/c to venue below. PTA pt was mod I with ADLs. Pt is currently mod A with LB ADLs, min to mod A +2 for safety with toilet/shower transfers, mod A +2 for safety with in-room functional mobility. Feel pt would be excellent candidate for CIR due to pt's PLOF, motivation, and family support. OT to continue to follow acutely.     Follow Up Recommendations  CIR    Equipment Recommendations  Other (comment) (TBD next venue)    Recommendations for Other Services       Precautions / Restrictions Precautions Precautions: Fall Precaution Comments: R ankle may invert Restrictions Weight Bearing Restrictions: No      Mobility Bed Mobility Overal bed mobility: Needs Assistance Bed Mobility: Supine to Sit     Supine to sit: Mod assist     General bed mobility comments: extra time, cues for technique; assist to advance BLE and light assist to advance trunk; pt with use of bed rails  Transfers Overall transfer level: Needs assistance Equipment used: Rolling walker (2 wheeled) Transfers: Sit to/from Stand Sit to Stand: Mod assist Stand pivot transfers: Min assist;+2 safety/equipment;From elevated surface       General transfer comment: SPT from EOB to [facility]; +2 safety    Balance Overall balance assessment: Needs assistance;History of Falls Sitting-balance support: Bilateral upper extremity supported;Feet supported Sitting balance-Leahy Scale: Fair     Standing balance support:  Bilateral upper extremity supported;During functional activity Standing balance-Leahy Scale: Poor Standing balance comment: external support for balance                            ADL Overall ADL's : Needs assistance/impaired Eating/Feeding: Set up;Sitting   Grooming: Minimal assistance;Standing;Wash/dry face;Oral care Grooming Details (indicate cue type and reason): stood at sink with recliner behind her to complete 2 simple grooming tasks. Min A for balance. Upper Body Bathing: Set up;Sitting   Lower Body Bathing: Moderate assistance;Sit to/from stand;Cueing for compensatory techniques   Upper Body Dressing : Set up;Sitting   Lower Body Dressing: Moderate assistance;Cueing for compensatory techniques;Sit to/from stand   Toilet Transfer: Minimal assistance;+2 for safety/equipment;Stand-pivot;BSC;RW Toilet Transfer Details (indicate cue type and reason): EOB to [facility] with min A for balance, +2 safety/equipment. Cues for technique with rw.  Toileting- Clothing Manipulation and Hygiene: Moderate assistance;Sit to/from stand   Tub/ Shower Transfer: Walk-in shower;Stand-pivot;3 in [employer] walker; mod A +2 for safety/equipment   Functional mobility during ADLs: Moderate assistance;+2 for safety/equipment General ADL Comments: Pt completed bed mobility, SPT to [facility] Hights, in-room functional mobility to recliner, and ADL tasks at sink as detailed above. Pt with BLE weakness, RLE weaker than LLE, and decreased activity tolerance impacting level of assist with ADLs. +2 for safety/equipment for SPT and functional mobility.      Vision     Perception     Praxis      Pertinent Vitals/Pain Pain Assessment: Faces Faces Pain Scale: Hurts a little bit Pain Location: "gas pain" Pain Intervention(s): Monitored during  session     Hand Dominance Right   Extremity/Trunk Assessment Upper Extremity Assessment Upper Extremity Assessment: Generalized weakness   Lower Extremity  Assessment Lower Extremity Assessment: Defer to PT evaluation       Communication Communication Communication: No difficulties   Cognition Arousal/Alertness: Awake/alert Behavior During Therapy: WFL for tasks assessed/performed Overall Cognitive Status: Within Functional Limits for tasks assessed                     General Comments       Exercises       Shoulder Instructions      Home Living Family/patient expects to be discharged to:: Private residence Living Arrangements: Spouse/significant other Available Help at Discharge: Family Type of Home: House Home Access: Stairs to enter Technical brewer of Steps: 2 Entrance Stairs-Rails: Right Home Layout: One level     Bathroom Shower/Tub: Walk-in shower;Other (comment) ("handicap shower")         Home Equipment: Walker - 4 wheels;Shower seat;Grab bars - tub/shower          Prior Functioning/Environment Level of Independence: Independent with assistive device(s)        Comments: limited distances to waLK; household distance functional mobility, mod I with ADLs    OT Diagnosis: Generalized weakness   OT Problem List: Decreased strength;Decreased activity tolerance;Impaired balance (sitting and/or standing);Decreased knowledge of use of DME or AE;Decreased knowledge of precautions;Impaired tone   OT Treatment/Interventions: Self-care/ADL training;Therapeutic exercise;Energy conservation;DME and/or AE instruction;Therapeutic activities;Patient/family education;Balance training    OT Goals(Current goals can be found in the care plan section) Acute Rehab OT Goals Patient Stated Goal: to walk, not fall OT Goal Formulation: With patient Time For Goal Achievement: 08/30/15 Potential to Achieve Goals: Good ADL Goals Pt Will Perform Grooming: with modified independence;standing Pt Will Perform Lower Body Bathing: with modified independence;sit to/from stand Pt Will Perform Lower Body Dressing: with  modified independence;sit to/from stand Pt Will Transfer to Toilet: with modified independence;ambulating (3n1 over toilet) Pt Will Perform Toileting - Clothing Manipulation and hygiene: with modified independence;sit to/from stand Pt Will Perform Tub/Shower Transfer: Shower transfer;with modified independence;ambulating;3 in 1;rolling walker Additional ADL Goal #1: Pt will complete bed mobility at mod I level to prepare for OOB ADLs.   OT Frequency: Min 3X/week   Barriers to D/C:            Co-evaluation              End of Session Equipment Utilized During Treatment: Gait belt;Rolling walker Nurse Communication: Other (comment) (nurse present for part of session)  Activity Tolerance: Patient tolerated treatment well Patient left: in chair;with call bell/phone within reach;with chair alarm set   Time: [id number] OT Time Calculation (min): 25 min Charges:  OT General Charges $OT Visit: 1 Procedure OT Evaluation $Initial OT Evaluation Tier I: 1 Procedure OT Treatments $Self Care/Home Management : 8-22 mins G-Codes:    Hortencia Pilar August 30, 2015, 10:03 AM

## 2015-08-23 NOTE — PMR Pre-admission (Signed)
PMR Admission Coordinator Pre-Admission Assessment  Patient: Gabriela Rice is an 59 y.o., female MRN: [id number] DOB: Dec 26, 1955 Height: _0  (162.6 cm) Weight: 90.719 kg (200 lb)              Insurance Information HMO:     PPO:       PCP:       IPA:       80/20:       OTHER:  Group # [id number] PRIMARY: BCBS      Policy#: [id number]      Subscriber: Blinda Leatherwood CM Name: Phyllis Ginger      Phone#: [phone]     Fax#: [phone] Pre-Cert#: [id number] with update due on 09/05/15      Employer:  Spouse works FT nights Benefits:  Phone #:  [phone]     Name: Verified on line [place]. Date: 09/30/14     Deduct:  $750 (met $750)      Out of Pocket Max: $4000 (met $2032.51      Life Max: unlimited CIR: 80% w/auth      SNF: 80% w/auth Outpatient: 80% with 30 visit limit     Co-Pay: 20% Home Health: 80%      Co-Pay: 20% DME: 80%     Co-Pay: 20% Providers: in network  SECONDARY: Medicare A/B      Policy#: [id number]      Subscriber:  Marcy Salvo CM Name:        Phone#:       Fax#:   Pre-Cert#:        Employer: Disabled Benefits:  Phone #:       Name: Automated Eff. Date: 10/31/14     Deduct: $1288      Out of Pocket Max: None      Life Max: unlimited CIR: 100%      SNF: 100 days Outpatient: 80%     Co-Pay: 20% Home Health: 100%      Co-Pay: none DME: 80%     Co-Pay: 62%  Medicaid Application Date:        Case Manager:   Disability Application Date:        Case Worker:    Emergency Contact Information Contact Information    Name Relation Home Work Mobile   Zoiee, Wimmer [phone]  [phone]     Current Medical History  Patient Admitting Diagnosis:  MS exacerbation  History of Present Illness: a 59 y.o. female with history of DM type 2, autoimmune hepatitis, morbid obesity, anxiety disorder, lumbar spondylosis, MS who was admitted on 11/20 with three day history of increase in BLE weakness, frequent falls and progressive gait problems as well as  worsening of abdominal pain. She was started on IV solumedrol for MS exacerbation and CT abdomen/pelvis was negative for diverticulitis or acute abnormalities. Patient declined MRI but was agreeable for CT lumbar spine which revealed left paracentral and foraminal disc extrusion L2/3 likely below L2 nerve root and no spinal stenosis. PT evaluation done yesterday and patient limited by RLE weakness with balance deficits and pain. CIR recommended by MD and rehab team.     Past Medical History  Past Medical History  Diagnosis Date  . DM (diabetes mellitus) ([place])   . Hyperlipidemia   . Multiple sclerosis ([place])   . Allergic rhinitis   . Elevated LFTs   . Obesity   . Vitamin D deficiency   . Abnormality of gait 02/16/2013  . Colon polyps   .  Diverticulosis   . Hemorrhoids   . Hepatitis     with cholestasis  . Hemorrhoids   . Autoimmune hepatitis ([place])   . Allergy   . Hypertension   . Autoimmune hepatitis ([place])   . Anxiety   . Migraines     Family History  family history includes Brain cancer in her maternal uncle; Cirrhosis in her father; Colon cancer in her maternal aunt; Diabetes in her mother; Heart disease in her maternal grandmother; Multiple sclerosis in her mother; Uterine cancer in her maternal aunt.  Prior Rehab/Hospitalizations:  Has the patient had major surgery during 100 days prior to admission? No.  Patient says she has had steroid injections most recently.  Current Medications   Current facility-administered medications:  .  0.9 %  sodium chloride infusion, , Intravenous, Continuous, Rise Patience, MD, Last Rate: 10 mL/hr at 08/21/15 0035 .  ALPRAZolam Duanne Moron) tablet 1 mg, 1 mg, Oral, QHS, Rise Patience, MD, 1 mg at 08/22/15 2136 .  alum & mag hydroxide-simeth (MAALOX/MYLANTA) 200-200-20 MG/5ML suspension 30 mL, 30 mL, Oral, Q4H PRN, Theodis Blaze, MD, 30 mL at 08/23/15 0837 .  aspirin EC tablet 81 mg, 81 mg, Oral, Daily, Rise Patience, MD, 81 mg at  08/23/15 1030 .  azaTHIOprine (IMURAN) tablet 150 mg, 150 mg, Oral, Daily, Rise Patience, MD, 150 mg at 08/23/15 1030 .  cholecalciferol (VITAMIN D) tablet 2,000 Units, 2,000 Units, Oral, Daily, Rise Patience, MD, 2,000 Units at 08/23/15 1029 .  clonazePAM (KLONOPIN) tablet 0.25 mg, 0.25 mg, Oral, TID PRN, Florencia Reasons, MD, 0.25 mg at 08/23/15 0837 .  enoxaparin (LOVENOX) injection 45 mg, 45 mg, Subcutaneous, Q24H, Rise Patience, MD, 45 mg at 08/23/15 1032 .  escitalopram (LEXAPRO) tablet 10 mg, 10 mg, Oral, Daily, Rise Patience, MD, 10 mg at 08/23/15 1029 .  gabapentin (NEURONTIN) capsule 300 mg, 300 mg, Oral, Daily, Rise Patience, MD, 300 mg at 08/22/15 2136 .  Glatiramer Acetate SOSY 40 mg, 40 mg, Subcutaneous, Q M,W,F, Rise Patience, MD, 40 mg at 08/23/15 1045 .  insulin aspart (novoLOG) injection 0-15 Units, 0-15 Units, Subcutaneous, TID WC, Rise Patience, MD, 8 Units at 08/23/15 1236 .  loratadine (CLARITIN) tablet 10 mg, 10 mg, Oral, Daily, Rise Patience, MD, 10 mg at 08/23/15 1029 .  losartan (COZAAR) tablet 100 mg, 100 mg, Oral, Daily, Rise Patience, MD, 100 mg at 08/23/15 1029 .  methylPREDNISolone sodium succinate (SOLU-MEDROL) 1,000 mg in sodium chloride 0.9 % 50 mL IVPB, 1,000 mg, Intravenous, Q24H, Florencia Reasons, MD, 1,000 mg at 08/22/15 2136 .  ondansetron (ZOFRAN) tablet 4 mg, 4 mg, Oral, Q6H PRN **OR** ondansetron (ZOFRAN) injection 4 mg, 4 mg, Intravenous, Q6H PRN, Rise Patience, MD .  oxyCODONE-acetaminophen (PERCOCET/ROXICET) 5-325 MG per tablet 1 tablet, 1 tablet, Oral, Q6H PRN, Rise Patience, MD, 1 tablet at 08/21/15 [phone] .  pantoprazole (PROTONIX) injection 40 mg, 40 mg, Intravenous, Q24H, Rise Patience, MD, 40 mg at 08/23/15 0021 .  polyethylene glycol (MIRALAX / GLYCOLAX) packet 17 g, 17 g, Oral, Daily, Shawn C Joy, PA-C, 17 g at 08/22/15 0858 .  polyethylene glycol powder (GLYCOLAX/MIRALAX) container 127.5 g, 0.5  Container, Oral, Once, Rise Patience, MD .  pravastatin (PRAVACHOL) tablet 40 mg, 40 mg, Oral, Daily, Rise Patience, MD, 40 mg at 08/23/15 1029 .  senna-docusate (Senokot-S) tablet 2 tablet, 2 tablet, Oral, BID, Florencia Reasons, MD, 2 tablet at 08/22/15  66 .  trimethoprim (TRIMPEX) tablet 100 mg, 100 mg, Oral, Daily, Rise Patience, MD, 100 mg at 08/23/15 1030  Patients Current Diet: Diet heart healthy/carb modified Room service appropriate?: Yes; Fluid consistency:: Thin Diet - low sodium heart healthy  Precautions / Restrictions Precautions Precautions: Fall Precaution Comments: R ankle may invert Restrictions Weight Bearing Restrictions: No   Has the patient had 2 or more falls or a fall with injury in the past year?Yes.  Has had about 10 falls in the last year with no injury.  Prior Activity Level Limited Community (1-2x/wk): Most recently, no going out as much due to weakness.  Home Assistive Devices / Equipment Home Assistive Devices/Equipment: Environmental consultant (specify type), Kasandra Knudsen (specify quad or straight), Wheelchair Home Equipment: Environmental consultant - 4 wheels, Shower seat, Grab bars - tub/shower  Prior Device Use: Indicate devices/aids used by the patient prior to current illness, exacerbation or injury? Walker and [employer]  Prior Functional Level Prior Function Level of Independence: Independent with assistive device(s) Comments: limited distances to waLK; household distance functional mobility, mod I with ADLs  Self Care: Did the patient need help bathing, dressing, using the toilet or eating?  Independent.  However husband helps with cooking and food preparation most recently.  Indoor Mobility: Did the patient need assistance with walking from room to room (with or without device)? Independent.  Progressed from cane and now using walker with increased weakness most recently.  Stairs: Did the patient need assistance with internal or external stairs (with or without device)? Needed  some help  Functional Cognition: Did the patient need help planning regular tasks such as shopping or remembering to take medications? Independent  Current Functional Level Cognition  Overall Cognitive Status: Within Functional Limits for tasks assessed    Extremity Assessment (includes Sensation/Coordination)  Upper Extremity Assessment: Generalized weakness  Lower Extremity Assessment: Defer to PT evaluation RLE Deficits / Details: decreased ankle  dorsiflexion and eversion= 2+, knee extension 3/5, hip flexion 3/5 RLE Sensation: decreased light touch LLE Deficits / Details: dorsiflexion 3+, knee ext 4/5, hip flexion 3+. LLE Sensation: decreased light touch    ADLs  Overall ADL's : Needs assistance/impaired Eating/Feeding: Set up, Sitting Grooming: Minimal assistance, Standing, Wash/dry face, Oral care Grooming Details (indicate cue type and reason): stood at sink with recliner behind her to complete 2 simple grooming tasks. Min A for balance. Upper Body Bathing: Set up, Sitting Lower Body Bathing: Moderate assistance, Sit to/from stand, Cueing for compensatory techniques Upper Body Dressing : Set up, Sitting Lower Body Dressing: Moderate assistance, Cueing for compensatory techniques, Sit to/from stand Toilet Transfer: Minimal assistance, +2 for safety/equipment, Stand-pivot, BSC, RW Toilet Transfer Details (indicate cue type and reason): EOB to [facility] with min A for balance, +2 safety/equipment. Cues for technique with rw.  Toileting- Clothing Manipulation and Hygiene: Moderate assistance, Sit to/from stand Tub/ Banker: Walk-in shower, Stand-pivot, 3 in 1, Rolling walker Functional mobility during ADLs: Moderate assistance, +2 for safety/equipment General ADL Comments: Pt completed bed mobility, SPT to [facility],  pivotal steps to recliner, and ADL tasks at sink as detailed above. Pt with BLE weakness RLE>LLE and decreased activity tolerance impacting level of assist with ADLs. +2  s/e for SPT and ambulation.     Mobility  Overal bed mobility: Needs Assistance Bed Mobility: Supine to Sit Supine to sit: Mod assist General bed mobility comments: extra time, cues for technique; assist to advance BLE and light assist to advance trunk; pt with use of bed rails  Transfers  Overall transfer level: Needs assistance Equipment used: Rolling walker (2 wheeled) Transfers: Sit to/from Stand Sit to Stand: Mod assist Stand pivot transfers: Min assist, +2 safety/equipment, From elevated surface General transfer comment: SPT from EOB to [facility]; +2 safety    Ambulation / Gait / Stairs / Wheelchair Mobility       Posture / Balance Balance Overall balance assessment: Needs assistance, History of Falls Sitting-balance support: Bilateral upper extremity supported, Feet supported Sitting balance-Leahy Scale: Fair Standing balance support: Bilateral upper extremity supported, During functional activity Standing balance-Leahy Scale: Poor Standing balance comment: external support for balance    Special needs/care consideration BiPAP/CPAP No CPM No Continuous Drip IV No Dialysis N No Oxygen No Special Bed No Trach Size No Wound Vac (area) No     Skin Bruises on her legs                               Bowel mgmt: Last BM 08/22/15 Bladder mgmt: Voids with incontinence.  Wears depends. Diabetic mgmt Yes, on oral medication at home.  On insulin in the hospital.    Previous Home Environment Living Arrangements: Spouse/significant other Available Help at Discharge: Family Type of Home: House Home Layout: One level Home Access: Stairs to enter Entrance Stairs-Rails: Right Entrance Stairs-Number of Steps: 2 Bathroom Shower/Tub: Gaffer, Other (comment) ("handicap shower") Home Care Services: No  Discharge Living Setting Plans for Discharge Living Setting: Patient's home, House, Lives with (comment) (Lives with husband.) Type of Home at Discharge: House Discharge Home  Layout: One level Discharge Home Access: Stairs to enter Entrance Stairs-Number of Steps: 2 steps at the front entry. Does the patient have any problems obtaining your medications?: No  Social/Family/Support Systems Patient Roles: Spouse, Parent (Has a husband and 2 sons.) Contact Information: Tinzlee Craker - spouse Anticipated Caregiver: Husband Anticipated Caregiver's Contact Information: Shanon Brow - [phone] Ability/Limitations of Caregiver: Husband works third shift.  Son lives close by. Caregiver Availability: Other (Comment) (Will be alone at night.) Discharge Plan Discussed with Primary Caregiver: Yes Is Caregiver In Agreement with Plan?: Yes Does Caregiver/Family have Issues with Lodging/Transportation while Pt is in Rehab?: No  Goals/Additional Needs Patient/Family Goal for Rehab: PT/OT supervision to min assist goals Expected length of stay: 12/15 days Cultural Considerations: None Dietary Needs: Heart healthy, carb mod, thin liquids Equipment Needs: TBD Pt/Family Agrees to Admission and willing to participate: Yes Program Orientation Provided & Reviewed with Pt/Caregiver Including Roles  & Responsibilities: Yes  Decrease burden of Care through IP rehab admission: N/A  Possible need for SNF placement upon discharge: Not planned  Patient Condition: This patient's medical and functional status has changed since the consult dated: 08/22/15  in which the Rehabilitation Physician determined and documented that the patient's condition is appropriate for intensive rehabilitative care in an inpatient rehabilitation facility. See "History of Present Illness" (above) for medical update. Functional changes are: Currently requiring mod assist for transfers and min/mod assist for ADLs.  Note patient changed her mind and did want to come to inpatient rehab.  Patient's medical and functional status update has been discussed with the Rehabilitation physician and patient remains appropriate for  inpatient rehabilitation. Will admit to inpatient rehab today.  Preadmission Screen Completed By:  [name], 08/23/2015 2:49 PM ______________________________________________________________________   Discussed status with Dr. Posey Pronto on 08/23/15 at 1448 and received telephone approval for admission today.  Admission Coordinator:  [name], time1448/Date11/23/16

## 2015-08-23 NOTE — Discharge Instructions (Signed)
Multiple Sclerosis °Multiple sclerosis (MS) is a disease of the central nervous system. It leads to the loss of the insulating covering of the nerves (myelin sheath) of your brain. When this happens, brain signals do not get sent properly or may not get sent at all. The age of onset of MS varies.  °CAUSES °The cause of MS is unknown. However, it is more common in the [place] than in the southern [place]. °RISK FACTORS °There is a higher number of women with MS than men. MS is not an illness that is passed down to you from your family members (inherited). However, your risk of MS is higher if you have a relative with MS. °SIGNS AND SYMPTOMS  °The symptoms of MS occur in episodes or attacks. These attacks may last weeks to months. There may be long periods of almost no symptoms between attacks. The symptoms of MS vary. This is because of the many different ways it affects the central nervous system. The main symptoms of MS include: °· Vision problems and eye pain. °· Numbness. °· Weakness. °· Inability to move your arms, hands, feet, or legs (paralysis). °· Balance problems. °· Tremors. °DIAGNOSIS  °Your health care provider can diagnose MS with the help of imaging exams and lab tests. These may include specialized X-ray exams and spinal fluid tests. The best imaging exam to confirm a diagnosis of MS is an MRI. °TREATMENT  °There is no known cure for MS, but there are medicines that can decrease the number and frequency of attacks. Steroids are often used for short-term relief. Physical and occupational therapy may also help. There are also many new alternative or complementary treatments available to help control the symptoms of MS. Ask your health care provider if any of these other options are right for you. °HOME CARE INSTRUCTIONS  °· Take medicines as directed by your health care provider. °· Exercise as directed by your health care provider. °SEEK MEDICAL CARE IF: °You begin to feel  depressed. °SEEK IMMEDIATE MEDICAL CARE IF: °· You develop paralysis. °· You have problems with bladder, bowel, or sexual function. °· You develop mental changes, such as forgetfulness or mood swings. °· You have a period of uncontrolled movements (seizure). °  °This information is not intended to replace advice given to you by your health care provider. Make sure you discuss any questions you have with your health care provider. °  °Document Released: 09/13/2000 Document Revised: 09/21/2013 Document Reviewed: 05/24/2013 °Elsevier Interactive Patient Education ©2016 [employer] ° °

## 2015-08-23 NOTE — Progress Notes (Signed)
Rehab admissions - I met with patient this am.  She now would like to come to acute inpatient rehab.  I spoke with therapy department.  I need the OT eval done quickly so that I can submit all to [facility].  We need authorization prior to inpatient rehab admission.  I am opening the case with [employer] and requesting acute inpatient rehab admission for today.  I will update all when I hear back from insurance case manager.  Call me for questions.  #[phone]

## 2015-08-23 NOTE — H&P (Signed)
Physical Medicine and Rehabilitation Admission H&P    Chief Complaint  Patient presents with  . Increase in BLE weakness with inability to walk    HPI:  Gabriela Rice is a 59 y.o. female with history of DM type 2, autoimmune hepatitis, morbid obesity, anxiety disorder, lumbar spondylosis, MS who was admitted on 11/20 with three day history of increase in BLE weakness, frequent falls and progressive gait problems as well as worsening of abdominal pain. She was started on IV solumedrol for MS exacerbation and CT abdomen/pelvis was negative for diverticulitis or acute abnormalities. Patient declined MRI but was agreeable for CT lumbar spine which revealed left paracentral and foraminal disc extrusion L2/3 likely below L2 nerve root and no spinal stenosis. She completes IV steroids today with recommendations to start prednisone taper tomorrow.  Therapy evaluations done and patient continues to be limited by RLE weakness, balance deficits and requires cues for safety.  CIR recommended for follow up therapy.   Review of Systems  Constitutional: Positive for malaise/fatigue.  HENT: Positive for tinnitus. Negative for hearing loss.   Eyes: Negative for blurred vision and double vision.  Respiratory: Negative for cough and shortness of breath.   Cardiovascular: Negative for chest pain, palpitations and leg swelling.  Gastrointestinal: Positive for nausea (chronic), abdominal pain (controlled) and constipation (chronic). Negative for heartburn and vomiting.  Genitourinary: Positive for urgency and frequency.       Has hesitancy and frequency.  Incontinence since admission due to immobility.   Musculoskeletal: Positive for myalgias (lower back and LLE) and back pain.  Skin: Negative.   Neurological: Positive for dizziness (on and off) and sensory change. Negative for tingling and headaches.  Endo/Heme/Allergies: Negative.   Psychiatric/Behavioral: Positive for depression. The patient is  nervous/anxious (has had high levels for 3 weeks PTA) and has insomnia.      Past Medical History  Diagnosis Date  . DM (diabetes mellitus) ([place])   . Hyperlipidemia   . Multiple sclerosis ([place])   . Allergic rhinitis   . Elevated LFTs   . Obesity   . Vitamin D deficiency   . Abnormality of gait 02/16/2013  . Colon polyps   . Diverticulosis   . Hemorrhoids   . Hepatitis     with cholestasis  . Hemorrhoids   . Autoimmune hepatitis ([place])   . Allergy   . Hypertension   . Autoimmune hepatitis ([place])   . Anxiety   . Migraines     Past Surgical History  Procedure Laterality Date  . Myoectomy    . Cesarean section      x 2  . Tonsillectomy and adenoidectomy    . Abdominal hysterectomy      Family History  Problem Relation Age of Onset  . Heart disease Maternal Grandmother   . Colon cancer Maternal Aunt   . Uterine cancer Maternal Aunt   . Brain cancer Maternal Uncle   . Diabetes Mother   . Multiple sclerosis Mother   . Cirrhosis Father     Social History:  Married. Independent with cane PTA. She lives in a 1 story home with 2 steps to enter. Disabled since 2003 due to [place]. Used to work in Orthoptist at [employer]. shje reports that she quit smoking about 19 years ago. Her smoking use included Cigarettes.  She has never used smokeless tobacco. She reports that she does not drink alcohol or use illicit drugs.    Allergies  Allergen Reactions  . Penicillins Other (See  Comments)    seizure as child Has patient had a PCN reaction causing immediate rash, facial/tongue/throat swelling, SOB or lightheadedness with hypotension: No Has patient had a PCN reaction causing severe rash involving mucus membranes or skin necrosis:No Has patient had a PCN reaction that required hospitalizationNo Has patient had a PCN reaction occurring within the last 10 years:No If all of the above answers are "NO", then may proceed with Cephalosporin use.    . Codeine Swelling    hands swell     Medications Prior to Admission  Medication Sig Dispense Refill  . ALPRAZolam (XANAX) 1 MG tablet Take 1 tablet (1 mg total) by mouth at bedtime. 90 tablet 3  . aspirin EC 81 MG tablet Take 81 mg by mouth daily.    Marland Kitchen azaTHIOprine (IMURAN) 50 MG tablet Take 3 tab at the same time daily. 90 tablet 11  . cetirizine (ZYRTEC) 10 MG tablet Take 10 mg by mouth daily.    . Cholecalciferol (VITAMIN D) 2000 UNITS CAPS Take 2,000 Units by mouth daily.     Marland Kitchen COPAXONE 40 MG/ML SOSY INJECT 40 MG (1 ML) UNDER THE SKIN THREE TIMES A WEEK (Patient taking differently: Inject 40 mg into the skin every Monday, Wednesday, and Friday. INJECT 40 MG (1 ML) UNDER THE SKIN THREE TIMES A WEEK) 36 Syringe 1  . escitalopram (LEXAPRO) 10 MG tablet Take 10 mg by mouth daily.  0  . gabapentin (NEURONTIN) 300 MG capsule Take 1 capsule (300 mg total) by mouth 3 (three) times daily. (Patient taking differently: Take 300 mg by mouth daily. ) 270 capsule 2  . ibuprofen (ADVIL,MOTRIN) 200 MG tablet Take 200 mg by mouth every 6 (six) hours as needed for pain.    Marland Kitchen losartan (COZAAR) 100 MG tablet Take 100 mg by mouth daily.     . metFORMIN (GLUCOPHAGE) 500 MG tablet Take 500 mg by mouth 2 (two) times daily with a meal.     . ondansetron (ZOFRAN) 4 MG tablet take 1 tablet every 8 hours if needed for nausea 30 tablet 0  . ONE TOUCH ULTRA TEST test strip 1 each by Other route daily.     Marland Kitchen oxyCODONE-acetaminophen (PERCOCET/ROXICET) 5-325 MG tablet Take 1 tablet by mouth every 6 (six) hours as needed. For pain.  0  . polyethylene glycol powder (GLYCOLAX/MIRALAX) powder take 17GM (DISSOLVED IN WATER) by mouth three times a day (Patient taking differently: take 17GM (DISSOLVED IN WATER) by mouth three times a day as needed for constipation) 527 g 3  . pravastatin (PRAVACHOL) 40 MG tablet Take 1 tablet by mouth daily.    Marland Kitchen trimethoprim (TRIMPEX) 100 MG tablet Take 100 mg by mouth daily.    Marland Kitchen azaTHIOprine (IMURAN) 50 MG tablet take 3 tablet  by mouth once daily 90 tablet 0    Home: Home Living Family/patient expects to be discharged to:: Private residence Living Arrangements: Spouse/significant other Available Help at Discharge: Family Type of Home: House Home Access: Stairs to enter Technical brewer of Steps: 2 Entrance Stairs-Rails: Right Home Layout: One level Bathroom Shower/Tub: Gaffer, Other (comment) ("handicap shower") Home Equipment: Environmental consultant - 4 wheels, Shower seat, Grab bars - tub/shower   Functional History: Prior Function Level of Independence: Independent with assistive device(s) Comments: limited distances to waLK; household distance functional mobility, mod I with ADLs  Functional Status:  Mobility: Bed Mobility Overal bed mobility: Needs Assistance Bed Mobility: Supine to Sit Supine to sit: Mod assist General bed mobility comments: extra  time, cues for technique; assist to advance BLE and light assist to advance trunk; pt with use of bed rails Transfers Overall transfer level: Needs assistance Equipment used: Rolling walker (2 wheeled) Transfers: Sit to/from Stand Sit to Stand: Mod assist Stand pivot transfers: Min assist, +2 safety/equipment, From elevated surface General transfer comment: SPT from EOB to [facility]; +2 safety      ADL: ADL Overall ADL's : Needs assistance/impaired Eating/Feeding: Set up, Sitting Grooming: Minimal assistance, Standing, Wash/dry face, Oral care Grooming Details (indicate cue type and reason): stood at sink with recliner behind her to complete 2 simple grooming tasks. Min A for balance. Upper Body Bathing: Set up, Sitting Lower Body Bathing: Moderate assistance, Sit to/from stand, Cueing for compensatory techniques Upper Body Dressing : Set up, Sitting Lower Body Dressing: Moderate assistance, Cueing for compensatory techniques, Sit to/from stand Toilet Transfer: Minimal assistance, +2 for safety/equipment, Stand-pivot, BSC, RW Toilet Transfer Details  (indicate cue type and reason): EOB to [facility] with min A for balance, +2 safety/equipment. Cues for technique with rw.  Toileting- Clothing Manipulation and Hygiene: Moderate assistance, Sit to/from stand Tub/ Banker: Walk-in shower, Stand-pivot, 3 in 1, Rolling walker Functional mobility during ADLs: Moderate assistance, +2 for safety/equipment General ADL Comments: Pt completed bed mobility, SPT to [facility],  pivotal steps to recliner, and ADL tasks at sink as detailed above. Pt with BLE weakness RLE>LLE and decreased activity tolerance impacting level of assist with ADLs. +2 s/e for SPT and ambulation.   Cognition: Cognition Overall Cognitive Status: Within Functional Limits for tasks assessed Cognition Arousal/Alertness: Awake/alert Behavior During Therapy: WFL for tasks assessed/performed Overall Cognitive Status: Within Functional Limits for tasks assessed    Blood pressure 146/71, pulse 70, temperature 98 F (36.7 C), temperature source Oral, resp. rate 18, height $RemoveBe'5\' 4"'[id number]$  (1.626 m), weight 90.719 kg (200 lb), SpO2 93 %. Physical Exam  Nursing note and vitals reviewed. Constitutional: She is oriented to person, place, and time. She appears well-developed and well-nourished. No distress.  HENT:  Head: Normocephalic and atraumatic.  Eyes: Conjunctivae and EOM are normal. Pupils are equal, round, and reactive to light.  Neck: Normal range of motion. Neck supple.  Cardiovascular: Normal rate and regular rhythm.   No murmur heard. Respiratory: Effort normal and breath sounds normal. No respiratory distress. She has no wheezes.  GI: Soft. Bowel sounds are normal. She exhibits no distension. There is no tenderness.  Musculoskeletal: She exhibits no edema or tenderness.  Neurological: She is alert and oriented to person, place, and time.  Speech clear.  Few beats nystagmus to left lateral field.   Follows basic commands without difficulty.   DTRs 1+ b/l LE Sensation intact to light  touch throughout Moves BUE 5/5 proximal to distal RLE hip flexion 2-/5, knee extention 4/5, dorsiflexion 3+/5, plantarflexion 4/5   LLE hip flexion 2/5, knee extention 4/5, dorsiflexion 3+/5, plantarflexion 4/5   Paraparesis noted with right foot inversion.  Extensor tone LLE.   Skin: Skin is warm and dry. She is not diaphoretic. There is erythema.  Psychiatric: She has a normal mood and affect. Her behavior is normal.    Results for orders placed or performed during the hospital encounter of 08/20/15 (from the past 48 hour(s))  Glucose, capillary     Status: Abnormal   Collection Time: 08/21/15  5:24 PM  Result Value Ref Range   Glucose-Capillary 146 (H) 65 - 99 mg/dL  Glucose, capillary     Status: Abnormal   Collection Time: 08/21/15  8:34 PM  Result Value Ref Range   Glucose-Capillary 119 (H) 65 - 99 mg/dL  CBC     Status: Abnormal   Collection Time: 08/22/15  5:40 AM  Result Value Ref Range   WBC 5.4 4.0 - 10.5 K/uL   RBC 3.89 3.87 - 5.11 MIL/uL   Hemoglobin 12.0 12.0 - 15.0 g/dL   HCT 35.1 (L) 36.0 - 46.0 %   MCV 90.2 78.0 - 100.0 fL   MCH 30.8 26.0 - 34.0 pg   MCHC 34.2 30.0 - 36.0 g/dL   RDW 14.5 11.5 - 15.5 %   Platelets 201 150 - 400 K/uL  Comprehensive metabolic panel     Status: Abnormal   Collection Time: 08/22/15  5:40 AM  Result Value Ref Range   Sodium 140 135 - 145 mmol/L   Potassium 4.1 3.5 - 5.1 mmol/L   Chloride 104 101 - 111 mmol/L   CO2 25 22 - 32 mmol/L   Glucose, Bld 204 (H) 65 - 99 mg/dL   BUN 22 (H) 6 - 20 mg/dL   Creatinine, Ser 0.75 0.44 - 1.00 mg/dL   Calcium 9.9 8.9 - 10.3 mg/dL   Total Protein 7.0 6.5 - 8.1 g/dL   Albumin 4.0 3.5 - 5.0 g/dL   AST 48 (H) 15 - 41 U/L   ALT 52 14 - 54 U/L   Alkaline Phosphatase 48 38 - 126 U/L   Total Bilirubin 1.4 (H) 0.3 - 1.2 mg/dL   GFR calc non Af Amer >60 >60 mL/min   GFR calc Af Amer >60 >60 mL/min    Comment: (NOTE) The eGFR has been calculated using the CKD EPI equation. This calculation has not  been validated in all clinical situations. eGFR's persistently <60 mL/min signify possible Chronic Kidney Disease.    Anion gap 11 5 - 15  Magnesium     Status: None   Collection Time: 08/22/15  5:40 AM  Result Value Ref Range   Magnesium 2.1 1.7 - 2.4 mg/dL  TSH     Status: Abnormal   Collection Time: 08/22/15  5:42 AM  Result Value Ref Range   TSH 0.266 (L) 0.350 - 4.500 uIU/mL  Hemoglobin A1c     Status: Abnormal   Collection Time: 08/22/15  5:42 AM  Result Value Ref Range   Hgb A1c MFr Bld 6.1 (H) 4.8 - 5.6 %    Comment: (NOTE)         Pre-diabetes: 5.7 - 6.4         Diabetes: >6.4         Glycemic control for adults with diabetes: <7.0    Mean Plasma Glucose 128 mg/dL    Comment: (NOTE) Performed At: [facility] [address] [place], [place] [id number] Lindon Romp MD [id number]   Glucose, capillary     Status: Abnormal   Collection Time: 08/22/15  8:29 AM  Result Value Ref Range   Glucose-Capillary 187 (H) 65 - 99 mg/dL   Comment 1 Notify RN   Glucose, capillary     Status: Abnormal   Collection Time: 08/22/15 11:47 AM  Result Value Ref Range   Glucose-Capillary 271 (H) 65 - 99 mg/dL   Comment 1 Notify RN   Glucose, capillary     Status: Abnormal   Collection Time: 08/22/15  4:55 PM  Result Value Ref Range   Glucose-Capillary 113 (H) 65 - 99 mg/dL   Comment 1 Notify RN   Glucose, capillary  Status: Abnormal   Collection Time: 08/22/15  9:49 PM  Result Value Ref Range   Glucose-Capillary 124 (H) 65 - 99 mg/dL  CBC     Status: None   Collection Time: 08/23/15  5:40 AM  Result Value Ref Range   WBC 6.2 4.0 - 10.5 K/uL   RBC 4.09 3.87 - 5.11 MIL/uL   Hemoglobin 12.9 12.0 - 15.0 g/dL   HCT 37.7 36.0 - 46.0 %   MCV 92.2 78.0 - 100.0 fL   MCH 31.5 26.0 - 34.0 pg   MCHC 34.2 30.0 - 36.0 g/dL   RDW 14.7 11.5 - 15.5 %   Platelets 226 150 - 400 K/uL  Comprehensive metabolic panel     Status: Abnormal   Collection Time: 08/23/15  5:40 AM    Result Value Ref Range   Sodium 139 135 - 145 mmol/L   Potassium 4.2 3.5 - 5.1 mmol/L   Chloride 102 101 - 111 mmol/L   CO2 26 22 - 32 mmol/L   Glucose, Bld 236 (H) 65 - 99 mg/dL   BUN 31 (H) 6 - 20 mg/dL   Creatinine, Ser 0.86 0.44 - 1.00 mg/dL   Calcium 9.5 8.9 - 10.3 mg/dL   Total Protein 7.1 6.5 - 8.1 g/dL   Albumin 3.9 3.5 - 5.0 g/dL   AST 82 (H) 15 - 41 U/L   ALT 67 (H) 14 - 54 U/L   Alkaline Phosphatase 49 38 - 126 U/L   Total Bilirubin 0.8 0.3 - 1.2 mg/dL   GFR calc non Af Amer >60 >60 mL/min   GFR calc Af Amer >60 >60 mL/min    Comment: (NOTE) The eGFR has been calculated using the CKD EPI equation. This calculation has not been validated in all clinical situations. eGFR's persistently <60 mL/min signify possible Chronic Kidney Disease.    Anion gap 11 5 - 15  Magnesium     Status: None   Collection Time: 08/23/15  5:40 AM  Result Value Ref Range   Magnesium 2.2 1.7 - 2.4 mg/dL  T4, free     Status: Abnormal   Collection Time: 08/23/15  5:40 AM  Result Value Ref Range   Free T4 1.14 (H) 0.61 - 1.12 ng/dL    Comment: Performed at [facility]  Glucose, capillary     Status: Abnormal   Collection Time: 08/23/15  8:23 AM  Result Value Ref Range   Glucose-Capillary 210 (H) 65 - 99 mg/dL  Glucose, capillary     Status: Abnormal   Collection Time: 08/23/15 11:55 AM  Result Value Ref Range   Glucose-Capillary 262 (H) 65 - 99 mg/dL   Comment 1 Notify RN     Ct Lumbar Spine Wo Contrast  08/21/2015  CLINICAL DATA:  59 year old with current history of multiple sclerosis presenting to the emergency department because of increasing bilateral lower extremity weakness causing the patient to fall earlier today. Patient declined MRI examination. EXAM: CT LUMBAR SPINE WITHOUT CONTRAST TECHNIQUE: Multidetector CT imaging of the lumbar spine was performed without intravenous contrast administration. Multiplanar CT image reconstructions were also generated. COMPARISON:   Bone window images from CT abdomen and pelvis yesterday. Lumbar spine x-rays 07/20/2015. FINDINGS: Five non rib-bearing lumbar vertebrae demonstrate anatomic alignment on the sagittal reconstructed images. No lumbar spine fractures. Individual disc spaces as follows: T12-L1: No evidence of disc protrusion or extrusion. Widely patent neural foramina. No spinal stenosis. No significant facet arthropathy. L1-2: Minimal circumferential disc bulge without frank disc  extrusion. Widely patent neural foramina. No spinal stenosis. No significant facet arthropathy. L2-3: Severe disc space narrowing and associated endplate hypertrophy. Central disc bulge and left paracentral and foraminal disc extrusion, though this is likely below the exiting left L2 nerve root. No spinal stenosis. Moderate right and mild left facet hypertrophy. L3-4: Mild circumferential disc bulge without frank disc extrusion. Widely patent neural foramina. No spinal stenosis. No significant facet arthropathy. L4-5: Central and right paracentral disc protrusion without frank disc extrusion. Widely patent neural foramina. No spinal stenosis. No significant facet arthropathy. L5-S1: Severe disc space narrowing and associated endplate hypertrophy. Circumferential disc bulge without frank disc extrusion. No spinal stenosis. Neural foramina widely patent. Lateral recesses widely patent. Moderate bilateral facet hypertrophy. IMPRESSION: 1. Left paracentral and left foraminal disc extrusion at L2-3, though this is likely below the exiting left L2 nerve root. 2. No evidence of disc extrusion elsewhere. 3. Widely patent neural foramina throughout the lumbar spine. 4. No spinal stenosis. Electronically Signed   By: Evangeline Dakin M.D.   On: 08/21/2015 18:27       Medical Problem List and Plan: 1. Functional deficits secondary to MS exacerbation with RLE weakness, balance deficits and poor safety awareness. 2.  DVT Prophylaxis/Anticoagulation:  Pharmaceutical: Lovenox 3. Pain Management: On neurontin tid for neuropathy.   Continue to use oxycodone prn for pain.  4. Mood: Team to provide ego support and education. LCSW to follow for evaluation and support.  5. Neuropsych: This patient is capable of making decisions on her own behalf. 6. Skin/Wound Care: Routine pressure relief measures 7. Fluids/Electrolytes/Nutrition: Monitor I/O. Encouraged patient to push po fluids.  Offer supplements prn poor intake 8. Autoimmune hepatitis:  On imuran daily. Continues to have intermittent nausea with poor po intake.   9. Anxiety disorder: Was started on Lexapro a week PTA.  Continue klonopin prn. 10. HTN: monitor BP bid. Continue Cozaar daily.  11. Dehydration: Encourage po fluids. Check lytes in am.  12.  DM type 2: Monitor BS ac/hs. Resume metformin as has been > 48 hours since CT scan.  13. Chronic UTIs: On trimpex for prophylaxis.    Post Admission Physician Evaluation: 1. Functional deficits secondary  to MS exacerbation. 2. Patient is admitted to receive collaborative, interdisciplinary care between the physiatrist, rehab nursing staff, and therapy team. 3. Patient's level of medical complexity and substantial therapy needs in context of that medical necessity cannot be provided at a lesser intensity of care such as a SNF. 4. Patient has experienced substantial functional loss from his/her baseline which was documented above under the "Functional History" and "Functional Status" headings.  Judging by the patient's diagnosis, physical exam, and functional history, the patient has potential for functional progress which will result in measurable gains while on inpatient rehab.  These gains will be of substantial and practical use upon discharge  in facilitating mobility and self-care at the household level. 5. Physiatrist will provide 24 hour management of medical needs as well as oversight of the therapy plan/treatment and provide guidance as  appropriate regarding the interaction of the two. 6. 24 hour rehab nursing will assist with bladder management, bowel management, safety, disease management, medication administration and patient education and help integrate therapy concepts, techniques,education, etc. 7. PT will assess and treat for/with: Lower extremity strength, range of motion, stamina, balance, functional mobility, safety, adaptive techniques and equipment, woundcare, coping skills, pain control, education.   Goals are: supervision/min A. 8. OT will assess and treat for/with: ADL's, functional mobility, safety,  upper extremity strength, adaptive techniques and equipment, wound mgt, ego support, and community reintegration.   Goals are: Supervision/Mod I. Therapy may proceed with showering this patient. 9. Case Management and Social Worker will assess and treat for psychological issues and discharge planning. 10. Team conference will be held weekly to assess progress toward goals and to determine barriers to discharge. 11. Patient will receive at least 3 hours of therapy per day at least 5 days per week. 12. ELOS: 12-15 days.       13. Prognosis:  good   Delice Lesch, MD 08/23/2015

## 2015-08-23 NOTE — Progress Notes (Signed)
Rehab admissions - I have approval for acute inpatient rehab admission for today from [facility].  Bed available, patient agreeable, and will admit to acute inpatient rehab today.  Call me for questions.  RC:[phone]

## 2015-08-23 NOTE — Progress Notes (Signed)
Received pt. As transfer from [place], Pt. Was oriented to the unit routine and protocols.Safety plan was explained,fall prevention plan was explained and sign by the pt. And RN.Keep monitoring pt. Closely and assessing her needs.

## 2015-08-23 NOTE — H&P (View-Only) (Signed)
Physical Medicine and Rehabilitation Admission H&P    Chief Complaint  Patient presents with  . Increase in BLE weakness with inability to walk    HPI:  Gabriela Rice is a 59 y.o. female with history of DM type 2, autoimmune hepatitis, morbid obesity, anxiety disorder, lumbar spondylosis, MS who was admitted on 11/20 with three day history of increase in BLE weakness, frequent falls and progressive gait problems as well as worsening of abdominal pain. She was started on IV solumedrol for MS exacerbation and CT abdomen/pelvis was negative for diverticulitis or acute abnormalities. Patient declined MRI but was agreeable for CT lumbar spine which revealed left paracentral and foraminal disc extrusion L2/3 likely below L2 nerve root and no spinal stenosis. She completes IV steroids today with recommendations to start prednisone taper tomorrow.  Therapy evaluations done and patient continues to be limited by RLE weakness, balance deficits and requires cues for safety.  CIR recommended for follow up therapy.   Review of Systems  Constitutional: Positive for malaise/fatigue.  HENT: Positive for tinnitus. Negative for hearing loss.   Eyes: Negative for blurred vision and double vision.  Respiratory: Negative for cough and shortness of breath.   Cardiovascular: Negative for chest pain, palpitations and leg swelling.  Gastrointestinal: Positive for nausea (chronic), abdominal pain (controlled) and constipation (chronic). Negative for heartburn and vomiting.  Genitourinary: Positive for urgency and frequency.       Has hesitancy and frequency.  Incontinence since admission due to immobility.   Musculoskeletal: Positive for myalgias (lower back and LLE) and back pain.  Skin: Negative.   Neurological: Positive for dizziness (on and off) and sensory change. Negative for tingling and headaches.  Endo/Heme/Allergies: Negative.   Psychiatric/Behavioral: Positive for depression. The patient is  nervous/anxious (has had high levels for 3 weeks PTA) and has insomnia.      Past Medical History  Diagnosis Date  . DM (diabetes mellitus) (HCC)   . Hyperlipidemia   . Multiple sclerosis (HCC)   . Allergic rhinitis   . Elevated LFTs   . Obesity   . Vitamin D deficiency   . Abnormality of gait 02/16/2013  . Colon polyps   . Diverticulosis   . Hemorrhoids   . Hepatitis     with cholestasis  . Hemorrhoids   . Autoimmune hepatitis (HCC)   . Allergy   . Hypertension   . Autoimmune hepatitis (HCC)   . Anxiety   . Migraines     Past Surgical History  Procedure Laterality Date  . Myoectomy    . Cesarean section      x 2  . Tonsillectomy and adenoidectomy    . Abdominal hysterectomy      Family History  Problem Relation Age of Onset  . Heart disease Maternal Grandmother   . Colon cancer Maternal Aunt   . Uterine cancer Maternal Aunt   . Brain cancer Maternal Uncle   . Diabetes Mother   . Multiple sclerosis Mother   . Cirrhosis Father     Social History:  Married. Independent with cane PTA. She lives in a 1 story home with 2 steps to enter. Disabled since 2003 due to MS. Used to work in Herbalist at [employer]. shje reports that she quit smoking about 19 years ago. Her smoking use included Cigarettes.  She has never used smokeless tobacco. She reports that she does not drink alcohol or use illicit drugs.    Allergies  Allergen Reactions  . Penicillins Other (See  Comments)    seizure as child Has patient had a PCN reaction causing immediate rash, facial/tongue/throat swelling, SOB or lightheadedness with hypotension: No Has patient had a PCN reaction causing severe rash involving mucus membranes or skin necrosis:No Has patient had a PCN reaction that required hospitalizationNo Has patient had a PCN reaction occurring within the last 10 years:No If all of the above answers are "NO", then may proceed with Cephalosporin use.    . Codeine Swelling    hands swell     Medications Prior to Admission  Medication Sig Dispense Refill  . ALPRAZolam (XANAX) 1 MG tablet Take 1 tablet (1 mg total) by mouth at bedtime. 90 tablet 3  . aspirin EC 81 MG tablet Take 81 mg by mouth daily.    Marland Kitchen azaTHIOprine (IMURAN) 50 MG tablet Take 3 tab at the same time daily. 90 tablet 11  . cetirizine (ZYRTEC) 10 MG tablet Take 10 mg by mouth daily.    . Cholecalciferol (VITAMIN D) 2000 UNITS CAPS Take 2,000 Units by mouth daily.     Marland Kitchen COPAXONE 40 MG/ML SOSY INJECT 40 MG (1 ML) UNDER THE SKIN THREE TIMES A WEEK (Patient taking differently: Inject 40 mg into the skin every Monday, Wednesday, and Friday. INJECT 40 MG (1 ML) UNDER THE SKIN THREE TIMES A WEEK) 36 Syringe 1  . escitalopram (LEXAPRO) 10 MG tablet Take 10 mg by mouth daily.  0  . gabapentin (NEURONTIN) 300 MG capsule Take 1 capsule (300 mg total) by mouth 3 (three) times daily. (Patient taking differently: Take 300 mg by mouth daily. ) 270 capsule 2  . ibuprofen (ADVIL,MOTRIN) 200 MG tablet Take 200 mg by mouth every 6 (six) hours as needed for pain.    Marland Kitchen losartan (COZAAR) 100 MG tablet Take 100 mg by mouth daily.     . metFORMIN (GLUCOPHAGE) 500 MG tablet Take 500 mg by mouth 2 (two) times daily with a meal.     . ondansetron (ZOFRAN) 4 MG tablet take 1 tablet every 8 hours if needed for nausea 30 tablet 0  . ONE TOUCH ULTRA TEST test strip 1 each by Other route daily.     Marland Kitchen oxyCODONE-acetaminophen (PERCOCET/ROXICET) 5-325 MG tablet Take 1 tablet by mouth every 6 (six) hours as needed. For pain.  0  . polyethylene glycol powder (GLYCOLAX/MIRALAX) powder take 17GM (DISSOLVED IN WATER) by mouth three times a day (Patient taking differently: take 17GM (DISSOLVED IN WATER) by mouth three times a day as needed for constipation) 527 g 3  . pravastatin (PRAVACHOL) 40 MG tablet Take 1 tablet by mouth daily.    Marland Kitchen trimethoprim (TRIMPEX) 100 MG tablet Take 100 mg by mouth daily.    Marland Kitchen azaTHIOprine (IMURAN) 50 MG tablet take 3 tablet  by mouth once daily 90 tablet 0    Home: Home Living Family/patient expects to be discharged to:: Private residence Living Arrangements: Spouse/significant other Available Help at Discharge: Family Type of Home: House Home Access: Stairs to enter Technical brewer of Steps: 2 Entrance Stairs-Rails: Right Home Layout: One level Bathroom Shower/Tub: Gaffer, Other (comment) ("handicap shower") Home Equipment: Environmental consultant - 4 wheels, Shower seat, Grab bars - tub/shower   Functional History: Prior Function Level of Independence: Independent with assistive device(s) Comments: limited distances to waLK; household distance functional mobility, mod I with ADLs  Functional Status:  Mobility: Bed Mobility Overal bed mobility: Needs Assistance Bed Mobility: Supine to Sit Supine to sit: Mod assist General bed mobility comments: extra  time, cues for technique; assist to advance BLE and light assist to advance trunk; pt with use of bed rails Transfers Overall transfer level: Needs assistance Equipment used: Rolling walker (2 wheeled) Transfers: Sit to/from Stand Sit to Stand: Mod assist Stand pivot transfers: Min assist, +2 safety/equipment, From elevated surface General transfer comment: SPT from EOB to [facility]; +2 safety      ADL: ADL Overall ADL's : Needs assistance/impaired Eating/Feeding: Set up, Sitting Grooming: Minimal assistance, Standing, Wash/dry face, Oral care Grooming Details (indicate cue type and reason): stood at sink with recliner behind her to complete 2 simple grooming tasks. Min A for balance. Upper Body Bathing: Set up, Sitting Lower Body Bathing: Moderate assistance, Sit to/from stand, Cueing for compensatory techniques Upper Body Dressing : Set up, Sitting Lower Body Dressing: Moderate assistance, Cueing for compensatory techniques, Sit to/from stand Toilet Transfer: Minimal assistance, +2 for safety/equipment, Stand-pivot, BSC, RW Toilet Transfer Details  (indicate cue type and reason): EOB to [facility] with min A for balance, +2 safety/equipment. Cues for technique with rw.  Toileting- Clothing Manipulation and Hygiene: Moderate assistance, Sit to/from stand Tub/ Banker: Walk-in shower, Stand-pivot, 3 in 1, Rolling walker Functional mobility during ADLs: Moderate assistance, +2 for safety/equipment General ADL Comments: Pt completed bed mobility, SPT to [facility],  pivotal steps to recliner, and ADL tasks at sink as detailed above. Pt with BLE weakness RLE>LLE and decreased activity tolerance impacting level of assist with ADLs. +2 s/e for SPT and ambulation.   Cognition: Cognition Overall Cognitive Status: Within Functional Limits for tasks assessed Cognition Arousal/Alertness: Awake/alert Behavior During Therapy: WFL for tasks assessed/performed Overall Cognitive Status: Within Functional Limits for tasks assessed    Blood pressure 146/71, pulse 70, temperature 98 F (36.7 C), temperature source Oral, resp. rate 18, height $RemoveBe'5\' 4"'[id number]$  (1.626 m), weight 90.719 kg (200 lb), SpO2 93 %. Physical Exam  Nursing note and vitals reviewed. Constitutional: She is oriented to person, place, and time. She appears well-developed and well-nourished. No distress.  HENT:  Head: Normocephalic and atraumatic.  Eyes: Conjunctivae and EOM are normal. Pupils are equal, round, and reactive to light.  Neck: Normal range of motion. Neck supple.  Cardiovascular: Normal rate and regular rhythm.   No murmur heard. Respiratory: Effort normal and breath sounds normal. No respiratory distress. She has no wheezes.  GI: Soft. Bowel sounds are normal. She exhibits no distension. There is no tenderness.  Musculoskeletal: She exhibits no edema or tenderness.  Neurological: She is alert and oriented to person, place, and time.  Speech clear.  Few beats nystagmus to left lateral field.   Follows basic commands without difficulty.   DTRs 1+ b/l LE Sensation intact to light  touch throughout Moves BUE 5/5 proximal to distal RLE hip flexion 2-/5, knee extention 4/5, dorsiflexion 3+/5, plantarflexion 4/5   LLE hip flexion 2/5, knee extention 4/5, dorsiflexion 3+/5, plantarflexion 4/5   Paraparesis noted with right foot inversion.  Extensor tone LLE.   Skin: Skin is warm and dry. She is not diaphoretic. There is erythema.  Psychiatric: She has a normal mood and affect. Her behavior is normal.    Results for orders placed or performed during the hospital encounter of 08/20/15 (from the past 48 hour(s))  Glucose, capillary     Status: Abnormal   Collection Time: 08/21/15  5:24 PM  Result Value Ref Range   Glucose-Capillary 146 (H) 65 - 99 mg/dL  Glucose, capillary     Status: Abnormal   Collection Time: 08/21/15  8:34 PM  Result Value Ref Range   Glucose-Capillary 119 (H) 65 - 99 mg/dL  CBC     Status: Abnormal   Collection Time: 08/22/15  5:40 AM  Result Value Ref Range   WBC 5.4 4.0 - 10.5 K/uL   RBC 3.89 3.87 - 5.11 MIL/uL   Hemoglobin 12.0 12.0 - 15.0 g/dL   HCT 29.0 (L) 37.9 - 55.8 %   MCV 90.2 78.0 - 100.0 fL   MCH 30.8 26.0 - 34.0 pg   MCHC 34.2 30.0 - 36.0 g/dL   RDW 31.6 74.2 - 55.2 %   Platelets 201 150 - 400 K/uL  Comprehensive metabolic panel     Status: Abnormal   Collection Time: 08/22/15  5:40 AM  Result Value Ref Range   Sodium 140 135 - 145 mmol/L   Potassium 4.1 3.5 - 5.1 mmol/L   Chloride 104 101 - 111 mmol/L   CO2 25 22 - 32 mmol/L   Glucose, Bld 204 (H) 65 - 99 mg/dL   BUN 22 (H) 6 - 20 mg/dL   Creatinine, Ser 5.89 0.44 - 1.00 mg/dL   Calcium 9.9 8.9 - 48.3 mg/dL   Total Protein 7.0 6.5 - 8.1 g/dL   Albumin 4.0 3.5 - 5.0 g/dL   AST 48 (H) 15 - 41 U/L   ALT 52 14 - 54 U/L   Alkaline Phosphatase 48 38 - 126 U/L   Total Bilirubin 1.4 (H) 0.3 - 1.2 mg/dL   GFR calc non Af Amer >60 >60 mL/min   GFR calc Af Amer >60 >60 mL/min    Comment: (NOTE) The eGFR has been calculated using the CKD EPI equation. This calculation has not  been validated in all clinical situations. eGFR's persistently <60 mL/min signify possible Chronic Kidney Disease.    Anion gap 11 5 - 15  Magnesium     Status: None   Collection Time: 08/22/15  5:40 AM  Result Value Ref Range   Magnesium 2.1 1.7 - 2.4 mg/dL  TSH     Status: Abnormal   Collection Time: 08/22/15  5:42 AM  Result Value Ref Range   TSH 0.266 (L) 0.350 - 4.500 uIU/mL  Hemoglobin A1c     Status: Abnormal   Collection Time: 08/22/15  5:42 AM  Result Value Ref Range   Hgb A1c MFr Bld 6.1 (H) 4.8 - 5.6 %    Comment: (NOTE)         Pre-diabetes: 5.7 - 6.4         Diabetes: >6.4         Glycemic control for adults with diabetes: <7.0    Mean Plasma Glucose 128 mg/dL    Comment: (NOTE) Performed At: [facility] [address] [place], [place] [id number] Mila Homer MD AC:[id number]   Glucose, capillary     Status: Abnormal   Collection Time: 08/22/15  8:29 AM  Result Value Ref Range   Glucose-Capillary 187 (H) 65 - 99 mg/dL   Comment 1 Notify RN   Glucose, capillary     Status: Abnormal   Collection Time: 08/22/15 11:47 AM  Result Value Ref Range   Glucose-Capillary 271 (H) 65 - 99 mg/dL   Comment 1 Notify RN   Glucose, capillary     Status: Abnormal   Collection Time: 08/22/15  4:55 PM  Result Value Ref Range   Glucose-Capillary 113 (H) 65 - 99 mg/dL   Comment 1 Notify RN   Glucose, capillary  Status: Abnormal   Collection Time: 08/22/15  9:49 PM  Result Value Ref Range   Glucose-Capillary 124 (H) 65 - 99 mg/dL  CBC     Status: None   Collection Time: 08/23/15  5:40 AM  Result Value Ref Range   WBC 6.2 4.0 - 10.5 K/uL   RBC 4.09 3.87 - 5.11 MIL/uL   Hemoglobin 12.9 12.0 - 15.0 g/dL   HCT 37.7 36.0 - 46.0 %   MCV 92.2 78.0 - 100.0 fL   MCH 31.5 26.0 - 34.0 pg   MCHC 34.2 30.0 - 36.0 g/dL   RDW 14.7 11.5 - 15.5 %   Platelets 226 150 - 400 K/uL  Comprehensive metabolic panel     Status: Abnormal   Collection Time: 08/23/15  5:40 AM    Result Value Ref Range   Sodium 139 135 - 145 mmol/L   Potassium 4.2 3.5 - 5.1 mmol/L   Chloride 102 101 - 111 mmol/L   CO2 26 22 - 32 mmol/L   Glucose, Bld 236 (H) 65 - 99 mg/dL   BUN 31 (H) 6 - 20 mg/dL   Creatinine, Ser 0.86 0.44 - 1.00 mg/dL   Calcium 9.5 8.9 - 10.3 mg/dL   Total Protein 7.1 6.5 - 8.1 g/dL   Albumin 3.9 3.5 - 5.0 g/dL   AST 82 (H) 15 - 41 U/L   ALT 67 (H) 14 - 54 U/L   Alkaline Phosphatase 49 38 - 126 U/L   Total Bilirubin 0.8 0.3 - 1.2 mg/dL   GFR calc non Af Amer >60 >60 mL/min   GFR calc Af Amer >60 >60 mL/min    Comment: (NOTE) The eGFR has been calculated using the CKD EPI equation. This calculation has not been validated in all clinical situations. eGFR's persistently <60 mL/min signify possible Chronic Kidney Disease.    Anion gap 11 5 - 15  Magnesium     Status: None   Collection Time: 08/23/15  5:40 AM  Result Value Ref Range   Magnesium 2.2 1.7 - 2.4 mg/dL  T4, free     Status: Abnormal   Collection Time: 08/23/15  5:40 AM  Result Value Ref Range   Free T4 1.14 (H) 0.61 - 1.12 ng/dL    Comment: Performed at [facility]  Glucose, capillary     Status: Abnormal   Collection Time: 08/23/15  8:23 AM  Result Value Ref Range   Glucose-Capillary 210 (H) 65 - 99 mg/dL  Glucose, capillary     Status: Abnormal   Collection Time: 08/23/15 11:55 AM  Result Value Ref Range   Glucose-Capillary 262 (H) 65 - 99 mg/dL   Comment 1 Notify RN     Ct Lumbar Spine Wo Contrast  08/21/2015  CLINICAL DATA:  59 year old with current history of multiple sclerosis presenting to the emergency department because of increasing bilateral lower extremity weakness causing the patient to fall earlier today. Patient declined MRI examination. EXAM: CT LUMBAR SPINE WITHOUT CONTRAST TECHNIQUE: Multidetector CT imaging of the lumbar spine was performed without intravenous contrast administration. Multiplanar CT image reconstructions were also generated. COMPARISON:   Bone window images from CT abdomen and pelvis yesterday. Lumbar spine x-rays 07/20/2015. FINDINGS: Five non rib-bearing lumbar vertebrae demonstrate anatomic alignment on the sagittal reconstructed images. No lumbar spine fractures. Individual disc spaces as follows: T12-L1: No evidence of disc protrusion or extrusion. Widely patent neural foramina. No spinal stenosis. No significant facet arthropathy. L1-2: Minimal circumferential disc bulge without frank disc  extrusion. Widely patent neural foramina. No spinal stenosis. No significant facet arthropathy. L2-3: Severe disc space narrowing and associated endplate hypertrophy. Central disc bulge and left paracentral and foraminal disc extrusion, though this is likely below the exiting left L2 nerve root. No spinal stenosis. Moderate right and mild left facet hypertrophy. L3-4: Mild circumferential disc bulge without frank disc extrusion. Widely patent neural foramina. No spinal stenosis. No significant facet arthropathy. L4-5: Central and right paracentral disc protrusion without frank disc extrusion. Widely patent neural foramina. No spinal stenosis. No significant facet arthropathy. L5-S1: Severe disc space narrowing and associated endplate hypertrophy. Circumferential disc bulge without frank disc extrusion. No spinal stenosis. Neural foramina widely patent. Lateral recesses widely patent. Moderate bilateral facet hypertrophy. IMPRESSION: 1. Left paracentral and left foraminal disc extrusion at L2-3, though this is likely below the exiting left L2 nerve root. 2. No evidence of disc extrusion elsewhere. 3. Widely patent neural foramina throughout the lumbar spine. 4. No spinal stenosis. Electronically Signed   By: Evangeline Dakin M.D.   On: 08/21/2015 18:27       Medical Problem List and Plan: 1. Functional deficits secondary to MS exacerbation with RLE weakness, balance deficits and poor safety awareness. 2.  DVT Prophylaxis/Anticoagulation:  Pharmaceutical: Lovenox 3. Pain Management: On neurontin tid for neuropathy.   Continue to use oxycodone prn for pain.  4. Mood: Team to provide ego support and education. LCSW to follow for evaluation and support.  5. Neuropsych: This patient is capable of making decisions on her own behalf. 6. Skin/Wound Care: Routine pressure relief measures 7. Fluids/Electrolytes/Nutrition: Monitor I/O. Encouraged patient to push po fluids.  Offer supplements prn poor intake 8. Autoimmune hepatitis:  On imuran daily. Continues to have intermittent nausea with poor po intake.   9. Anxiety disorder: Was started on Lexapro a week PTA.  Continue klonopin prn. 10. HTN: monitor BP bid. Continue Cozaar daily.  11. Dehydration: Encourage po fluids. Check lytes in am.  12.  DM type 2: Monitor BS ac/hs. Resume metformin as has been > 48 hours since CT scan.  13. Chronic UTIs: On trimpex for prophylaxis.    Post Admission Physician Evaluation: 1. Functional deficits secondary  to MS exacerbation. 2. Patient is admitted to receive collaborative, interdisciplinary care between the physiatrist, rehab nursing staff, and therapy team. 3. Patient's level of medical complexity and substantial therapy needs in context of that medical necessity cannot be provided at a lesser intensity of care such as a SNF. 4. Patient has experienced substantial functional loss from his/her baseline which was documented above under the "Functional History" and "Functional Status" headings.  Judging by the patient's diagnosis, physical exam, and functional history, the patient has potential for functional progress which will result in measurable gains while on inpatient rehab.  These gains will be of substantial and practical use upon discharge  in facilitating mobility and self-care at the household level. 5. Physiatrist will provide 24 hour management of medical needs as well as oversight of the therapy plan/treatment and provide guidance as  appropriate regarding the interaction of the two. 6. 24 hour rehab nursing will assist with bladder management, bowel management, safety, disease management, medication administration and patient education and help integrate therapy concepts, techniques,education, etc. 7. PT will assess and treat for/with: Lower extremity strength, range of motion, stamina, balance, functional mobility, safety, adaptive techniques and equipment, woundcare, coping skills, pain control, education.   Goals are: supervision/min A. 8. OT will assess and treat for/with: ADL's, functional mobility, safety,  upper extremity strength, adaptive techniques and equipment, wound mgt, ego support, and community reintegration.   Goals are: Supervision/Mod I. Therapy may proceed with showering this patient. 9. Case Management and Social Worker will assess and treat for psychological issues and discharge planning. 10. Team conference will be held weekly to assess progress toward goals and to determine barriers to discharge. 11. Patient will receive at least 3 hours of therapy per day at least 5 days per week. 12. ELOS: 12-15 days.       13. Prognosis:  good   Delice Lesch, MD 08/23/2015

## 2015-08-23 NOTE — Discharge Summary (Addendum)
Physician Discharge Summary  Gabriela Rice [id number] DOB: 03/09/1956 DOA: 08/20/2015  PCP: Marjorie Smolder, MD  Admit date: 08/20/2015 Discharge date: 08/23/2015  Recommendations for Outpatient Follow-up:  1. Pt will need to follow up with PCP in 2-3 weeks post discharge 2. Please obtain BMP to evaluate electrolytes and kidney function 3. Continue Prednisone taper pack, directions outlined below  4. Discharge to inpatient rehab   Discharge Diagnoses:  Principal Problem:   Multiple sclerosis exacerbation ([place]) Active Problems:   Abdominal pain   Autoimmune hepatitis ([place])   Diabetes mellitus type 2, controlled ([place])   Hypertension  Discharge Condition: Stable  Diet recommendation: Heart healthy diet discussed in details   History of present illness:  59 y.o. female with history of multiple sclerosis presents to the ER because of increasing weakness of the lower extremity. Patient states over the last few days patient has been having increasing weakness of the lower extremity and was unable to walk.   Hospital Course:  MS exacerbation  - was on solumedrol and I have discussed with neurologist on call - Dr. Nicole Kindred neurologist recommended quick taper on Prednisone starting 60 mg tablet and tapering down by 10 mg daily until completed  - pt will be discharged to inpatient rehab   Progressive weakness/frequent falls - PT/inpatient rehab   DM type II with neuropathies - continue metformin upon discharge  - continue neurontin   Code Status: full  Family Communication: patient   Disposition Plan: inpatient rehab   Procedures/Studies: Ct Lumbar Spine Wo Contrast  08/21/2015  Left paracentral and left foraminal disc extrusion at L2-3, though this is likely below the exiting left L2 nerve root. 2. No evidence of disc extrusion elsewhere. 3. Widely patent neural foramina throughout the lumbar spine. 4. No spinal stenosis.  Ct Abdomen Pelvis W Contrast 08/20/2015  . No  acute abnormalities seen in the abdomen or pelvis. 2. Tiny hepatic and right renal cysts noted. 3. Scattered diverticulosis along the sigmoid colon, without evidence of diverticulitis.   Discharge Exam: Filed Vitals:   08/22/15 2251 08/23/15 0546  BP: 143/68 122/73  Pulse: 56 84  Temp: 98.3 F (36.8 C) 97.9 F (36.6 C)  Resp: 18 18   Filed Vitals:   08/22/15 0444 08/22/15 1500 08/22/15 2251 08/23/15 0546  BP: 117/65 131/60 143/68 122/73  Pulse: 82 74 56 84  Temp: 97.8 F (36.6 C) 97.7 F (36.5 C) 98.3 F (36.8 C) 97.9 F (36.6 C)  TempSrc: Oral Oral Oral Oral  Resp: 18 18 18 18   Height:      Weight:      SpO2: 95% 98% 96% 93%    General: Pt is alert, follows commands appropriately, not in acute distress Cardiovascular: Regular rate and rhythm, S1/S2 +, no murmurs, no rubs, no gallops Respiratory: Clear to auscultation bilaterally, no wheezing, no crackles, no rhonchi Abdominal: Soft, non tender, non distended, bowel sounds +, no guarding  Discharge Instructions  Discharge Instructions    Diet - low sodium heart healthy    Complete by:  As directed      Increase activity slowly    Complete by:  As directed             Medication List    TAKE these medications        ALPRAZolam 1 MG tablet  Commonly known as:  XANAX  Take 1 tablet (1 mg total) by mouth at bedtime.     aspirin EC 81 MG tablet  Take 81  mg by mouth daily.     azaTHIOprine 50 MG tablet  Commonly known as:  IMURAN  Take 3 tab at the same time daily.     cetirizine 10 MG tablet  Commonly known as:  ZYRTEC  Take 10 mg by mouth daily.     COPAXONE 40 MG/ML Sosy  Generic drug:  Glatiramer Acetate  INJECT 40 MG (1 ML) UNDER THE SKIN THREE TIMES A WEEK     escitalopram 10 MG tablet  Commonly known as:  LEXAPRO  Take 10 mg by mouth daily.     gabapentin 300 MG capsule  Commonly known as:  NEURONTIN  Take 1 capsule (300 mg total) by mouth 3 (three) times daily.     ibuprofen 200 MG tablet   Commonly known as:  ADVIL,MOTRIN  Take 200 mg by mouth every 6 (six) hours as needed for pain.     losartan 100 MG tablet  Commonly known as:  COZAAR  Take 100 mg by mouth daily.     metFORMIN 500 MG tablet  Commonly known as:  GLUCOPHAGE  Take 500 mg by mouth 2 (two) times daily with a meal.     ondansetron 4 MG tablet  Commonly known as:  ZOFRAN  take 1 tablet every 8 hours if needed for nausea     ONE TOUCH ULTRA TEST test strip  Generic drug:  glucose blood  1 each by Other route daily.     oxyCODONE-acetaminophen 5-325 MG tablet  Commonly known as:  PERCOCET/ROXICET  Take 1 tablet by mouth every 6 (six) hours as needed. For pain.     pantoprazole 40 MG tablet  Commonly known as:  PROTONIX  Take 1 tablet (40 mg total) by mouth daily.     polyethylene glycol powder powder  Commonly known as:  GLYCOLAX/MIRALAX  take 17GM (DISSOLVED IN WATER) by mouth three times a day     pravastatin 40 MG tablet  Commonly known as:  PRAVACHOL  Take 1 tablet by mouth daily.     predniSONE 10 MG tablet  Commonly known as:  DELTASONE  Take 60 mg tablet starting today and taper down by 10 mg daily until completed     trimethoprim 100 MG tablet  Commonly known as:  TRIMPEX  Take 100 mg by mouth daily.     Vitamin D 2000 UNITS Caps  Take 2,000 Units by mouth daily.           Follow-up Information    Follow up with Marjorie Smolder, MD.   Specialty:  Family Medicine   Contact information:   [address] [place] [id number] [phone]        The results of significant diagnostics from this hospitalization (including imaging, microbiology, ancillary and laboratory) are listed below for reference.     Microbiology: No results found for this or any previous visit (from the past 240 hour(s)).   Labs: Basic Metabolic Panel:  Recent Labs Lab 08/20/15 2009 08/21/15 0544 08/22/15 0540 08/23/15 0540  NA 141 140 140 139  K 3.4* 4.2 4.1 4.2  CL  106 106 104 102  CO2 25 25 25 26   GLUCOSE 139* 172* 204* 236*  BUN 23* 13 22* 31*  CREATININE 0.69 0.69 0.75 0.86  CALCIUM 9.5 9.2 9.9 9.5  MG  --   --  2.1 2.2   Liver Function Tests:  Recent Labs Lab 08/20/15 2009 08/21/15 0544 08/22/15 0540 08/23/15 0540  AST 73* 70*  48* 82*  ALT 58* 59* 52 67*  ALKPHOS 46 50 48 49  BILITOT 1.4* 1.1 1.4* 0.8  PROT 6.9 7.0 7.0 7.1  ALBUMIN 4.1 4.0 4.0 3.9    Recent Labs Lab 08/20/15 2009  LIPASE 50   CBC:  Recent Labs Lab 08/20/15 2009 08/21/15 0544 08/22/15 0540 08/23/15 0540  WBC 5.3 3.4* 5.4 6.2  NEUTROABS  --  2.9  --   --   HGB 12.3 12.6 12.0 12.9  HCT 35.7* 37.4 35.1* 37.7  MCV 90.4 92.1 90.2 92.2  PLT 202 199 201 226   CBG:  Recent Labs Lab 08/21/15 2034 08/22/15 0829 08/22/15 1147 08/22/15 1655 08/22/15 2149  GLUCAP 119* 187* 271* 113* 124*   SIGNED: Time coordinating discharge: 30 minutes  MAGICK-Penney Domanski, MD  [facility] 08/23/2015, 9:19 AM Pager [phone]  If 7PM-7AM, please contact night-coverage [email or web address] Password [email or web address]

## 2015-08-23 NOTE — Interval H&P Note (Signed)
Gabriela Rice was admitted today to Inpatient Rehabilitation with the diagnosis of MS exacerbation.  The patient's history has been reviewed, patient examined, and there is no change in status.  Patient continues to be appropriate for intensive inpatient rehabilitation.  I have reviewed the patient's chart and labs.  Questions were answered to the patient's satisfaction. The PAPE has been reviewed and assessment remains appropriate.  Ankit Lorie Phenix 08/23/2015, 8:11 PM

## 2015-08-24 LAB — CBC WITH DIFFERENTIAL/PLATELET
BASOS ABS: 0 10*3/uL (ref 0.0–0.1)
BASOS PCT: 0 %
EOS ABS: 0 10*3/uL (ref 0.0–0.7)
Eosinophils Relative: 0 %
HEMATOCRIT: 36.2 % (ref 36.0–46.0)
Hemoglobin: 12.5 g/dL (ref 12.0–15.0)
Lymphocytes Relative: 5 %
Lymphs Abs: 0.3 10*3/uL — ABNORMAL LOW (ref 0.7–4.0)
MCH: 31.6 pg (ref 26.0–34.0)
MCHC: 34.5 g/dL (ref 30.0–36.0)
MCV: 91.4 fL (ref 78.0–100.0)
MONO ABS: 0.1 10*3/uL (ref 0.1–1.0)
Monocytes Relative: 2 %
NEUTROS ABS: 4.9 10*3/uL (ref 1.7–7.7)
Neutrophils Relative %: 93 %
PLATELETS: 203 10*3/uL (ref 150–400)
RBC: 3.96 MIL/uL (ref 3.87–5.11)
RDW: 14.7 % (ref 11.5–15.5)
WBC: 5.3 10*3/uL (ref 4.0–10.5)

## 2015-08-24 LAB — GLUCOSE, CAPILLARY
GLUCOSE-CAPILLARY: 235 mg/dL — AB (ref 65–99)
Glucose-Capillary: 233 mg/dL — ABNORMAL HIGH (ref 65–99)
Glucose-Capillary: 234 mg/dL — ABNORMAL HIGH (ref 65–99)
Glucose-Capillary: 132 mg/dL — ABNORMAL HIGH (ref 65–99)

## 2015-08-24 LAB — COMPREHENSIVE METABOLIC PANEL
ALT: 77 U/L — AB (ref 14–54)
ANION GAP: 8 (ref 5–15)
AST: 112 U/L — ABNORMAL HIGH (ref 15–41)
Albumin: 3.4 g/dL — ABNORMAL LOW (ref 3.5–5.0)
Alkaline Phosphatase: 44 U/L (ref 38–126)
BUN: 27 mg/dL — ABNORMAL HIGH (ref 6–20)
CHLORIDE: 103 mmol/L (ref 101–111)
CO2: 27 mmol/L (ref 22–32)
Calcium: 9.5 mg/dL (ref 8.9–10.3)
Creatinine, Ser: 0.86 mg/dL (ref 0.44–1.00)
GFR calc non Af Amer: >60 mL/min (ref >60)
Glucose, Bld: 257 mg/dL — ABNORMAL HIGH (ref 65–99)
Potassium: 4.3 mmol/L (ref 3.5–5.1)
SODIUM: 138 mmol/L (ref 135–145)
Total Bilirubin: 0.9 mg/dL (ref 0.3–1.2)
Total Protein: 6.2 g/dL — ABNORMAL LOW (ref 6.5–8.1)

## 2015-08-24 MED ORDER — ENOXAPARIN SODIUM 40 MG/0.4ML ~~LOC~~ SOLN
40.0000 mg | SUBCUTANEOUS | Status: DC
  Administered 2015-08-24 – 2015-09-07 (×15): 40 mg via SUBCUTANEOUS
  Filled 2015-08-24 (×15): qty 0.4

## 2015-08-24 MED ORDER — PANTOPRAZOLE SODIUM 40 MG PO TBEC
40.0000 mg | DELAYED_RELEASE_TABLET | Freq: Every day | ORAL | Status: DC
  Administered 2015-08-25 – 2015-09-08 (×15): 40 mg via ORAL
  Filled 2015-08-24 (×15): qty 1

## 2015-08-24 NOTE — Plan of Care (Signed)
Problem: RH SKIN INTEGRITY Goal: RH STG MAINTAIN SKIN INTEGRITY WITH ASSISTANCE STG Maintain Skin Integrity With [place].  Outcome: Progressing Use of MGP for peri area

## 2015-08-24 NOTE — Progress Notes (Signed)
[facility]     PROGRESS NOTE    Subjective/Complaints: Had a good night. Report no new pain. Able to sleep.   ROS: Pt denies fever, rash/itching, headache, blurred or double vision, nausea, vomiting, abdominal pain, diarrhea, chest pain, shortness of breath, palpitations, dysuria, dizziness, neck or back pain, bleeding, anxiety, or depression   Objective: Vital Signs: Blood pressure 123/69, pulse 66, temperature 98.5 F (36.9 C), temperature source Oral, resp. rate 18, height 5\' 4"  (1.626 m), weight 86.456 kg (190 lb 9.6 oz), SpO2 94 %. No results found.  Recent Labs  08/23/15 0540 08/24/15 0448  WBC 6.2 5.3  HGB 12.9 12.5  HCT 37.7 36.2  PLT 226 203    Recent Labs  08/23/15 0540 08/24/15 0448  NA 139 138  K 4.2 4.3  CL 102 103  GLUCOSE 236* 257*  BUN 31* 27*  CREATININE 0.86 0.86  CALCIUM 9.5 9.5   CBG (last 3)   Recent Labs  08/23/15 1155 08/23/15 1728 08/23/15 2049  GLUCAP 262* 161* 160*    Wt Readings from Last 3 Encounters:  08/23/15 86.456 kg (190 lb 9.6 oz)  08/20/15 90.719 kg (200 lb)  07/13/15 90.719 kg (200 lb)    Physical Exam:  GEN: no distress HENT: oral mucosa moist Head: Normocephalic and atraumatic.  Eyes: Conjunctivae and EOM are normal. Pupils are equal, round, and reactive to light.  Neck: Normal range of motion. Neck supple.  Cardiovascular: Normal rate and regular rhythm.  No murmur heard. Respiratory: Effort normal and breath sounds normal. No respiratory distress. She has no wheezes.  GI: Soft. Bowel sounds are normal. She exhibits no distension. There is no tenderness.  Musculoskeletal: She exhibits no edema or tenderness.  Neurological: She is alert and oriented to person, place, and time.  Speech clear.  Few beats nystagmus to left lateral field.  Follows basic commands without difficulty.  DTRs 1+ b/l LE Sensation intact to light touch throughout Moves BUE 5/5 proximal to  distal RLE hip flexion 2-/5, knee extention 4/5, dorsiflexion 3+/5, plantarflexion 4/5  LLE hip flexion 2/5, knee extention 4/5, dorsiflexion 3+/5, plantarflexion 4/5     Extensor tone LLE.  Skin: Skin is warm and dry. She is not diaphoretic. There is erythema.  Psychiatric: She has a normal mood and affect. She is a little anxious  Assessment/Plan: 1. Functional deficits secondary to MS exacerbation with weakness,sensory loss, balance deficits which require 3+ hours per day of interdisciplinary therapy in a comprehensive inpatient rehab setting. Physiatrist is providing close team supervision and 24 hour management of active medical problems listed below. Physiatrist and rehab team continue to assess barriers to discharge/monitor patient progress toward functional and medical goals.  Function:  Bathing Bathing position      Bathing parts      Bathing assist        Upper Body Dressing/Undressing Upper body dressing                    Upper body assist        Lower Body Dressing/Undressing Lower body dressing                                  Lower body assist        Toileting Toileting Toileting activity did not occur: No continent bowel/bladder event        Toileting assist  Transfers Chair/bed Physiological scientist Comprehension Comprehension assist level: Follows complex conversation/direction with extra time/assistive device  Expression Expression assist level: Expresses complex ideas: With extra time/assistive device  Social Interaction Social Interaction assist level: Interacts appropriately with others - No medications needed.  Problem Solving Problem solving assist level: Solves complex 90% of the time/cues < 10% of the time  Memory Memory assist level: Complete Independence: No helper   Medical Problem List and Plan: 1. Functional deficits secondary to MS  exacerbation with RLE weakness, balance deficits and poor safety awareness.  -CIR therapies 2. DVT Prophylaxis/Anticoagulation: Pharmaceutical: Lovenox 3. Pain Management: On neurontin tid for neuropathy. Continue to use oxycodone prn for pain.--controlled at present  4. Mood: Team to provide ego support and education. LCSW to follow for evaluation and support.  5. Neuropsych: This patient is capable of making decisions on her own behalf. 6. Skin/Wound Care: Routine pressure relief measures 7. Fluids/Electrolytes/Nutrition: Monitor I/O. Encouraged patient to push po fluids. Offer supplements prn poor intake 8. Autoimmune hepatitis: On imuran daily. Continues to have intermittent nausea with poor po intake.  9. Anxiety disorder: Was started on Lexapro a week PTA. Continue klonopin prn. 10. HTN: monitor BP bid. Continue Cozaar daily.  11. Dehydration: Encouraging PO fluids. BUN slightly improved/holding---recheck over weekend. I personally reviewed the patient's labs today.   12. DM type 2: Monitor BS ac/hs. Resumed metformin ---follow for pattern. Poor control at present.  13. Chronic UTIs: On trimpex for prophylaxis.   LOS (Days) 1 A FACE TO FACE EVALUATION WAS PERFORMED  SWARTZ,ZACHARY T 08/24/2015 8:44 AM

## 2015-08-25 ENCOUNTER — Inpatient Hospital Stay (HOSPITAL_COMMUNITY): Payer: BLUE CROSS/BLUE SHIELD

## 2015-08-25 ENCOUNTER — Inpatient Hospital Stay (HOSPITAL_COMMUNITY): Payer: BLUE CROSS/BLUE SHIELD | Admitting: Physical Therapy

## 2015-08-25 ENCOUNTER — Encounter (HOSPITAL_COMMUNITY): Payer: BLUE CROSS/BLUE SHIELD

## 2015-08-25 LAB — GLUCOSE, CAPILLARY
GLUCOSE-CAPILLARY: 159 mg/dL — AB (ref 65–99)
GLUCOSE-CAPILLARY: 163 mg/dL — AB (ref 65–99)
Glucose-Capillary: 134 mg/dL — ABNORMAL HIGH (ref 65–99)
Glucose-Capillary: 222 mg/dL — ABNORMAL HIGH (ref 65–99)

## 2015-08-25 MED ORDER — ALPRAZOLAM 0.5 MG PO TABS
0.5000 mg | ORAL_TABLET | Freq: Two times a day (BID) | ORAL | Status: DC
  Administered 2015-08-25 – 2015-09-08 (×29): 0.5 mg via ORAL
  Filled 2015-08-25 (×4): qty 2
  Filled 2015-08-25: qty 1
  Filled 2015-08-25 (×5): qty 2
  Filled 2015-08-25: qty 1
  Filled 2015-08-25 (×2): qty 2
  Filled 2015-08-25: qty 1
  Filled 2015-08-25: qty 2
  Filled 2015-08-25: qty 1
  Filled 2015-08-25 (×5): qty 2
  Filled 2015-08-25: qty 1
  Filled 2015-08-25: qty 2
  Filled 2015-08-25: qty 1
  Filled 2015-08-25 (×4): qty 2
  Filled 2015-08-25: qty 1
  Filled 2015-08-25 (×2): qty 2

## 2015-08-25 NOTE — Progress Notes (Signed)
Ankit Lorie Phenix, MD Physician Signed Physical Medicine and Rehabilitation Consult Note 08/22/2015 8:22 AM  Related encounter: ED to Hosp-Admission (Discharged) from 08/20/2015 in [place] Collapse All        Physical Medicine and Rehabilitation Consult  Reason for Consult: MS exacerbation Referring Physician: Dr. Erlinda Hong   HPI: Gabriela Rice is a 59 y.o. female with history of DM type 2, autoimmune hepatitis, morbid obesity, anxiety disorder, lumbar spondylosis, MS who was admitted on 11/20 with three day history of increase in BLE weakness, frequent falls and progressive gait problems as well as worsening of abdominal pain. She was started on IV solumedrol for MS exacerbation and CT abdomen/pelvis was negative for diverticulitis or acute abnormalities. Patient declined MRI but was agreeable for CT lumbar spine which revealed left paracentral and foraminal disc extrusion L2/3 likely below L2 nerve root and no spinal stenosis. PT evaluation done yesterday and patient limited by RLE weakness with balance deficits and pain. CIR recommended by MD and rehab team.    Review of Systems  Eyes: Positive for blurred vision.  Gastrointestinal: Positive for abdominal pain and constipation.  Genitourinary: Positive for urgency (with incontinece) and frequency.  Musculoskeletal: Positive for back pain (chronic low back with radiation to right leg), joint pain (chronic right hip and right knee pain), falls and neck pain.  Neurological: Positive for tingling, sensory change and focal weakness (BLE).  Psychiatric/Behavioral: The patient is nervous/anxious.  All other systems reviewed and are negative.   Past Medical History  Diagnosis Date  . DM (diabetes mellitus) ([place])   . Hyperlipidemia   . Multiple sclerosis ([place])   . Allergic rhinitis   . Elevated LFTs   . Obesity   . Vitamin D deficiency   . Abnormality  of gait 02/16/2013  . Colon polyps   . Diverticulosis   . Hemorrhoids   . Hepatitis     with cholestasis  . Hemorrhoids   . Autoimmune hepatitis ([place])   . Allergy   . Hypertension   . Autoimmune hepatitis ([place])   . Anxiety   . Migraines     Past Surgical History  Procedure Laterality Date  . Myoectomy    . Cesarean section      x 2  . Tonsillectomy and adenoidectomy    . Abdominal hysterectomy      Family History  Problem Relation Age of Onset  . Heart disease Maternal Grandmother   . Colon cancer Maternal Aunt   . Uterine cancer Maternal Aunt   . Brain cancer Maternal Uncle   . Diabetes Mother   . Multiple sclerosis Mother   . Cirrhosis Father     Social History: Married. Independent with cane PTA. She lives in a 1 story home with 2 steps to enter. Reports that she quit smoking about 19 years ago. Her smoking use included Cigarettes. She has a 1 pack-year smoking history. She has never used smokeless tobacco. She reports that she does not drink alcohol or use illicit drugs.     Allergies  Allergen Reactions  . Penicillins Other (See Comments)    seizure as child Has patient had a PCN reaction causing immediate rash, facial/tongue/throat swelling, SOB or lightheadedness with hypotension: No Has patient had a PCN reaction causing severe rash involving mucus membranes or skin necrosis:No Has patient had a PCN reaction that required hospitalizationNo Has patient had a PCN reaction occurring within the last 10 years:No If  all of the above answers are "NO", then may proceed with Cephalosporin use.    . Codeine Swelling    hands swell   Medications Prior to Admission  Medication Sig Dispense Refill  . ALPRAZolam (XANAX) 1 MG tablet Take 1 tablet (1 mg total) by mouth at bedtime. 90 tablet 3  . aspirin EC 81 MG tablet Take 81 mg by mouth  daily.    Marland Kitchen azaTHIOprine (IMURAN) 50 MG tablet Take 3 tab at the same time daily. 90 tablet 11  . cetirizine (ZYRTEC) 10 MG tablet Take 10 mg by mouth daily.    . Cholecalciferol (VITAMIN D) 2000 UNITS CAPS Take 2,000 Units by mouth daily.     Marland Kitchen COPAXONE 40 MG/ML SOSY INJECT 40 MG (1 ML) UNDER THE SKIN THREE TIMES A WEEK (Patient taking differently: Inject 40 mg into the skin every Monday, Wednesday, and Friday. INJECT 40 MG (1 ML) UNDER THE SKIN THREE TIMES A WEEK) 36 Syringe 1  . escitalopram (LEXAPRO) 10 MG tablet Take 10 mg by mouth daily.  0  . gabapentin (NEURONTIN) 300 MG capsule Take 1 capsule (300 mg total) by mouth 3 (three) times daily. (Patient taking differently: Take 300 mg by mouth daily. ) 270 capsule 2  . ibuprofen (ADVIL,MOTRIN) 200 MG tablet Take 200 mg by mouth every 6 (six) hours as needed for pain.    Marland Kitchen losartan (COZAAR) 100 MG tablet Take 100 mg by mouth daily.     . metFORMIN (GLUCOPHAGE) 500 MG tablet Take 500 mg by mouth 2 (two) times daily with a meal.     . ondansetron (ZOFRAN) 4 MG tablet take 1 tablet every 8 hours if needed for nausea 30 tablet 0  . ONE TOUCH ULTRA TEST test strip 1 each by Other route daily.     Marland Kitchen oxyCODONE-acetaminophen (PERCOCET/ROXICET) 5-325 MG tablet Take 1 tablet by mouth every 6 (six) hours as needed. For pain.  0  . polyethylene glycol powder (GLYCOLAX/MIRALAX) powder take 17GM (DISSOLVED IN WATER) by mouth three times a day (Patient taking differently: take 17GM (DISSOLVED IN WATER) by mouth three times a day as needed for constipation) 527 g 3  . pravastatin (PRAVACHOL) 40 MG tablet Take 1 tablet by mouth daily.    Marland Kitchen trimethoprim (TRIMPEX) 100 MG tablet Take 100 mg by mouth daily.    Marland Kitchen azaTHIOprine (IMURAN) 50 MG tablet take 3 tablet by mouth once daily 90 tablet 0    Home: Home Living Family/patient expects to be discharged to:: Private residence Living  Arrangements: Spouse/significant other Available Help at Discharge: Family Type of Home: House Home Access: Stairs to enter Technical brewer of Steps: 2 Entrance Stairs-Rails: Right Home Layout: One level Bathroom Shower/Tub: [place]: Environmental consultant - 4 wheels, Shower seat, Grab bars - tub/shower  Functional History: Prior Function Level of Independence: Independent with assistive device(s) Comments: limited distances to waLK. Functional Status:  Mobility: Bed Mobility Overal bed mobility: Needs Assistance Bed Mobility: Supine to Sit Supine to sit: Mod assist General bed mobility comments: extra time, cues for technique Transfers Overall transfer level: Needs assistance Equipment used: Rolling walker (2 wheeled) Transfers: Sit to/from Stand, Stand Pivot Transfers Sit to Stand: Mod assist, +2 safety/equipment Stand pivot transfers: Mod assist, +2 safety/equipment General transfer comment: close guarding of the R kne and ankle did stand and take small stweps to recliner      ADL:    Cognition: Cognition Overall Cognitive Status: Within Functional Limits for tasks assessed  Cognition Arousal/Alertness: Awake/alert Behavior During Therapy: WFL for tasks assessed/performed Overall Cognitive Status: Within Functional Limits for tasks assessed  Blood pressure 117/65, pulse 82, temperature 97.8 F (36.6 C), temperature source Oral, resp. rate 18, height 5\' 4"  (1.626 m), weight 90.719 kg (200 lb), SpO2 95 %. Physical Exam  Vitals reviewed. Constitutional: She is oriented to person, place, and time. She appears well-developed and well-nourished. No distress.  HENT:  Head: Normocephalic and atraumatic.  Eyes: Conjunctivae and EOM are normal.  Neck: Normal range of motion. Neck supple.  Cardiovascular: Normal rate and regular rhythm.  No murmur heard. Respiratory: Effort normal and breath sounds normal.  GI: Soft. Bowel sounds are normal.    Musculoskeletal: She exhibits no edema or tenderness.  Neurological: She is alert and oriented to person, place, and time.  Motor: B/l UE 5/5 RLE: hip flexion, knee ext 2-/5, ankle dorsi/plantar flexion 3+/5 LLE: hip flexion, knee ext 2/5, ankle dorsi/plantar flexion 4-/5 Difficulty with fine motor tasks  Skin: Skin is warm and dry.  Psychiatric: She has a normal mood and affect. Her behavior is normal. Judgment normal.     Lab Results Last 24 Hours    Results for orders placed or performed during the hospital encounter of 08/20/15 (from the past 24 hour(s))  Glucose, capillary Status: Abnormal   Collection Time: 08/21/15 11:34 AM  Result Value Ref Range   Glucose-Capillary 176 (H) 65 - 99 mg/dL  Glucose, capillary Status: Abnormal   Collection Time: 08/21/15 5:24 PM  Result Value Ref Range   Glucose-Capillary 146 (H) 65 - 99 mg/dL  Glucose, capillary Status: Abnormal   Collection Time: 08/21/15 8:34 PM  Result Value Ref Range   Glucose-Capillary 119 (H) 65 - 99 mg/dL  CBC Status: Abnormal   Collection Time: 08/22/15 5:40 AM  Result Value Ref Range   WBC 5.4 4.0 - 10.5 K/uL   RBC 3.89 3.87 - 5.11 MIL/uL   Hemoglobin 12.0 12.0 - 15.0 g/dL   HCT 35.1 (L) 36.0 - 46.0 %   MCV 90.2 78.0 - 100.0 fL   MCH 30.8 26.0 - 34.0 pg   MCHC 34.2 30.0 - 36.0 g/dL   RDW 14.5 11.5 - 15.5 %   Platelets 201 150 - 400 K/uL  Comprehensive metabolic panel Status: Abnormal   Collection Time: 08/22/15 5:40 AM  Result Value Ref Range   Sodium 140 135 - 145 mmol/L   Potassium 4.1 3.5 - 5.1 mmol/L   Chloride 104 101 - 111 mmol/L   CO2 25 22 - 32 mmol/L   Glucose, Bld 204 (H) 65 - 99 mg/dL   BUN 22 (H) 6 - 20 mg/dL   Creatinine, Ser 0.75 0.44 - 1.00 mg/dL   Calcium 9.9 8.9 - 10.3 mg/dL   Total Protein 7.0 6.5 - 8.1 g/dL   Albumin 4.0 3.5 - 5.0 g/dL    AST 48 (H) 15 - 41 U/L   ALT 52 14 - 54 U/L   Alkaline Phosphatase 48 38 - 126 U/L   Total Bilirubin 1.4 (H) 0.3 - 1.2 mg/dL   GFR calc non Af Amer >60 >60 mL/min   GFR calc Af Amer >60 >60 mL/min   Anion gap 11 5 - 15  Magnesium Status: None   Collection Time: 08/22/15 5:40 AM  Result Value Ref Range   Magnesium 2.1 1.7 - 2.4 mg/dL  TSH Status: Abnormal   Collection Time: 08/22/15 5:42 AM  Result Value Ref Range   TSH 0.266 (L) 0.350 -  4.500 uIU/mL      Imaging Results (Last 48 hours)    Ct Lumbar Spine Wo Contrast  08/21/2015 CLINICAL DATA: 59 year old with current history of multiple sclerosis presenting to the emergency department because of increasing bilateral lower extremity weakness causing the patient to fall earlier today. Patient declined MRI examination. EXAM: CT LUMBAR SPINE WITHOUT CONTRAST TECHNIQUE: Multidetector CT imaging of the lumbar spine was performed without intravenous contrast administration. Multiplanar CT image reconstructions were also generated. COMPARISON: Bone window images from CT abdomen and pelvis yesterday. Lumbar spine x-rays 07/20/2015. FINDINGS: Five non rib-bearing lumbar vertebrae demonstrate anatomic alignment on the sagittal reconstructed images. No lumbar spine fractures. Individual disc spaces as follows: T12-L1: No evidence of disc protrusion or extrusion. Widely patent neural foramina. No spinal stenosis. No significant facet arthropathy. L1-2: Minimal circumferential disc bulge without frank disc extrusion. Widely patent neural foramina. No spinal stenosis. No significant facet arthropathy. L2-3: Severe disc space narrowing and associated endplate hypertrophy. Central disc bulge and left paracentral and foraminal disc extrusion, though this is likely below the exiting left L2 nerve root. No spinal stenosis. Moderate right and mild left facet hypertrophy. L3-4: Mild circumferential disc bulge  without frank disc extrusion. Widely patent neural foramina. No spinal stenosis. No significant facet arthropathy. L4-5: Central and right paracentral disc protrusion without frank disc extrusion. Widely patent neural foramina. No spinal stenosis. No significant facet arthropathy. L5-S1: Severe disc space narrowing and associated endplate hypertrophy. Circumferential disc bulge without frank disc extrusion. No spinal stenosis. Neural foramina widely patent. Lateral recesses widely patent. Moderate bilateral facet hypertrophy. IMPRESSION: 1. Left paracentral and left foraminal disc extrusion at L2-3, though this is likely below the exiting left L2 nerve root. 2. No evidence of disc extrusion elsewhere. 3. Widely patent neural foramina throughout the lumbar spine. 4. No spinal stenosis. Electronically Signed By: Evangeline Dakin M.D. On: 08/21/2015 18:27   Ct Abdomen Pelvis W Contrast  08/20/2015 CLINICAL DATA: Acute onset of epigastric abdominal pain, radiating to the lower back. Leg weakness. Initial encounter. EXAM: CT ABDOMEN AND PELVIS WITH CONTRAST TECHNIQUE: Multidetector CT imaging of the abdomen and pelvis was performed using the standard protocol following bolus administration of intravenous contrast. CONTRAST: 134mL OMNIPAQUE IOHEXOL 300 MG/ML SOLN COMPARISON: CT of the abdomen and pelvis from 05/08/2015 FINDINGS: The visualized lung bases are clear. A stable 5 mm hypodensity is noted within the right hepatic lobe. The liver and spleen are otherwise unremarkable. The gallbladder is within normal limits. The pancreas and adrenal glands are unremarkable. A small 5 mm cyst is noted at the upper pole of the right kidney. The kidneys are otherwise unremarkable. There is no evidence of hydronephrosis. No renal or ureteral stones are seen. No perinephric stranding is appreciated. No free fluid is identified. The small bowel is unremarkable in appearance. The stomach is within normal limits. No acute  vascular abnormalities are seen. The appendix is normal in caliber and contains air, without evidence of appendicitis. Scattered diverticulosis is noted along the sigmoid colon, without evidence of diverticulitis. The bladder is mildly distended and grossly unremarkable. The patient is status post hysterectomy. No suspicious adnexal masses are seen. No inguinal lymphadenopathy is seen. Mild chronic subcutaneous inflammation is noted along the anterior abdominal wall. No acute osseous abnormalities are identified. Vacuum phenomenon and endplate degenerative change are noted at L2-L3, and at L5-S1. IMPRESSION: 1. No acute abnormalities seen in the abdomen or pelvis. 2. Tiny hepatic and right renal cysts noted. 3. Scattered diverticulosis along the sigmoid colon,  without evidence of diverticulitis. Electronically Signed By: Garald Balding M.D. On: 08/20/2015 22:00     Assessment/Plan: Diagnosis: MS exacerbation 1. Does the need for close, 24 hr/day medical supervision in concert with the patient's rehab needs make it unreasonable for this patient to be served in a less intensive setting? Potentially 2. Co-Morbidities requiring supervision/potential complications: DM type 2 with neuropathy (Monitor in accordance with exercise and adjust meds as necessary), autoimmune hepatitis (avoid hepatotoxic meds when possible), morbid obesity (Diet and exercise education, cont to encourage weight loss to increase endurance and promote overall health), anxiety (ensure anxiety and resulting apprehension do not limit functional progress; consider prn medications if warranted) lumbar spondylosis, HTN (monitor and provide prns in accordance with increased physical exertion and pain) 3. Due to bladder management, bowel management, safety, disease management, medication administration and patient education, does the patient require 24 hr/day rehab nursing? Potentially 4. Does the patient require coordinated care of a  physician, rehab nurse, PT (1.5-2 hrs/day, 5 days/week) and OT (1.5-2 hrs/day, 5 days/week) to address physical and functional deficits in the context of the above medical diagnosis(es)? Potentially Addressing deficits in the following areas: balance, endurance, locomotion, strength, transferring, bowel/bladder control, bathing, dressing, toileting and psychosocial support 5. Can the patient actively participate in an intensive therapy program of at least 3 hrs of therapy per day at least 5 days per week? Yes 6. The potential for patient to make measurable gains while on inpatient rehab is good 7. Anticipated functional outcomes upon discharge from inpatient rehab are supervision and min assist with PT, modified independent and supervision with OT, n/a with SLP. 8. Estimated rehab length of stay to reach the above functional goals is: 12-15 days. 9. Does the patient have adequate social supports and living environment to accommodate these discharge functional goals? Potentially 10. Anticipated D/C setting: Home 11. Anticipated post D/C treatments: HH therapy and Home excercise program 12. Overall Rehab/Functional Prognosis: good  RECOMMENDATIONS: This patient's condition is appropriate for continued rehabilitative care in the following setting: Pt refusing IRF services at present. It is also unclear if the patient has 24/7 support at discharge. If the pt does have support, keeping in mind pt's wishes, would recommend home with [facility].  Patient has agreed to participate in recommended program. No Note that insurance prior authorization may be required for reimbursement for recommended care.  Delice Lesch, MD 08/22/2015       Revision History     Date/Time User Provider Type Action   08/22/2015 9:41 AM Ankit Lorie Phenix, MD Physician Sign   08/22/2015 8:41 AM Bary Leriche, PA-C Physician Assistant Pend   View Details Report       Routing History     Date/Time From To Method    08/22/2015 9:41 AM Ankit Lorie Phenix, MD Ankit Lorie Phenix, MD In [facility]   08/22/2015 9:41 AM Ankit Lorie Phenix, MD Darcus Austin, MD Fax

## 2015-08-25 NOTE — Progress Notes (Signed)
Patient information reviewed and entered into eRehab system by Ahliya Glatt, RN, CRRN, PPS Coordinator.  Information including medical coding and functional independence measure will be reviewed and updated through discharge.     Per nursing patient was given "Data Collection Information Summary for Patients in Inpatient Rehabilitation Facilities with attached "Privacy Act Statement-Health Care Records" upon admission.  

## 2015-08-25 NOTE — Evaluation (Signed)
Physical Therapy Assessment and Plan  Patient Details  Name: Gabriela Rice MRN: [id number] Date of Birth: [birth year]  PT Diagnosis: Abnormality of gait, Coordination disorder, Difficulty walking, Hypotonia, Impaired sensation, Low back pain, Muscle weakness and Pain in abdomen Rehab Potential: Good ELOS: 2 weeks   Today's Date: 08/25/2015 PT Individual Time: 1030-1200 PT Individual Time Calculation (min): 90 min    Problem List:  Patient Active Problem List   Diagnosis Date Noted  . Weakness of both legs   . Essential hypertension   . Type 2 diabetes mellitus with diabetic neuropathy ([place])   . Morbid obesity ([place])   . Generalized anxiety disorder   . Lumbar spondylosis with myelopathy   . Dehydration   . Chronic UTI   . Multiple sclerosis exacerbation ([place]) 08/20/2015  . Abdominal pain 08/20/2015  . Autoimmune hepatitis ([place]) 08/20/2015  . Diabetes mellitus type 2, controlled ([place]) 08/20/2015  . Hypertension 08/20/2015  . Right-sided Lumbar Facet Syndrome 07/13/2015  . Lumbar spondylosis 07/13/2015  . Chronic low back pain 07/13/2015  . Trigger point with back pain 07/13/2015  . Myofascial pain syndrome 07/13/2015  . Chronic pain 07/13/2015  . Chronic pain syndrome 07/13/2015  . Generalized weakness 07/13/2015  . Right-sided chronic neck pain 07/13/2015  . Cervical spondylosis 07/13/2015  . DDD (degenerative disc disease), cervical 07/13/2015  . Cervical facet syndrome 07/13/2015  . DDD (degenerative disc disease), lumbar 07/13/2015  . Pain of right lower extremity 07/13/2015  . Chronic radicular lumbar pain 07/13/2015  . Non-insulin dependent type 2 diabetes mellitus ([place]) 07/13/2015  . History of hysterectomy 07/13/2015  . History of C-section 07/13/2015  . DM (diabetes mellitus) ([place]) 07/13/2015  . Hyperlipidemia 07/13/2015  . Obesity 07/13/2015  . Diverticulosis 07/13/2015  . Lumbago 02/02/2014  . Difficulty in walking(719.7) 02/02/2014  . HBP (high blood  pressure) 07/11/2013  . Chronic rhinitis 04/08/2013  . Abnormality of gait 02/16/2013  . Elevated liver function tests 02/12/2013  . Abdominal pain, right upper quadrant 02/12/2013  . Nausea alone 02/12/2013  . Dyspnea 02/04/2013  . Hypoxemia, nocturnal only  02/04/2013  . Encounter for long-term (current) use of other medications 10/14/2012  . Multiple sclerosis ([place]) 10/14/2012  . IRRITABLE BOWEL SYNDROME 03/29/2009  . FATTY LIVER DISEASE 03/29/2009  . Nonspecific elevation of levels of transaminase or lactic acid dehydrogenase (LDH) 03/29/2009  . PERSONAL HX COLONIC POLYPS 03/29/2009    Past Medical History:  Past Medical History  Diagnosis Date  . DM (diabetes mellitus) ([place])   . Hyperlipidemia   . Multiple sclerosis ([place])   . Allergic rhinitis   . Elevated LFTs   . Obesity   . Vitamin D deficiency   . Abnormality of gait 02/16/2013  . Colon polyps   . Diverticulosis   . Hemorrhoids   . Hepatitis     with cholestasis  . Hemorrhoids   . Autoimmune hepatitis ([place])   . Allergy   . Hypertension   . Autoimmune hepatitis ([place])   . Anxiety   . Migraines    Past Surgical History:  Past Surgical History  Procedure Laterality Date  . Myoectomy    . Cesarean section      x 2  . Tonsillectomy and adenoidectomy    . Abdominal hysterectomy      Assessment & Plan Clinical Impression: Gabriela Rice is a 59 y.o. female with history of DM type 2, autoimmune hepatitis, morbid obesity, anxiety disorder, lumbar spondylosis, MS who was admitted on 11/20 with three day  history of increase in BLE weakness, frequent falls and progressive gait problems as well as worsening of abdominal pain. She was started on IV solumedrol for MS exacerbation and CT abdomen/pelvis was negative for diverticulitis or acute abnormalities. Patient declined MRI but was agreeable for CT lumbar spine which revealed left paracentral and foraminal disc extrusion L2/3 likely below L2 nerve root and no spinal  stenosis. She completes IV steroids today with recommendations to start prednisone taper tomorrow. Therapy evaluations done and patient continues to be limited by RLE weakness, balance deficits and requires cues for safety.  Patient transferred to CIR on 08/23/2015 .   Patient currently requires total with mobility secondary to muscle weakness and muscle joint tightness, decreased cardiorespiratoy endurance and MS related fatigue, impaired timing and sequencing, abnormal tone and unbalanced muscle activation and decreased sitting balance and decreased standing balance.  Prior to hospitalization, patient was modified independent  with mobility and lived with Spouse, Other (Comment) (2 sons available in the neighborhood and family next door to assist) in a House home.  Home access is 2Stairs to enter.  Patient will benefit from skilled PT intervention to maximize safe functional mobility, minimize fall risk and decrease caregiver burden for planned discharge home with 24 hour assist.  Anticipate patient will benefit from follow up [facility] at discharge.  PT - End of Session Activity Tolerance: Tolerates < 10 min activity with changes in vital signs Endurance Deficit: Yes Endurance Deficit Description: cardiorespiratory and significant muscular fatigue from MS PT Assessment Rehab Potential (ACUTE/IP ONLY): Good Barriers to Discharge: Inaccessible home environment PT Patient demonstrates impairments in the following area(s): Balance;Endurance;Motor;Pain;Safety;Sensory PT Transfers Functional Problem(s): Bed Mobility;Bed to Chair;Car;Furniture PT Locomotion Functional Problem(s): Ambulation;Wheelchair Mobility;Stairs PT Plan PT Intensity: Minimum of 1-2 x/day ,45 to 90 minutes PT Frequency: 5 out of 7 days PT Duration Estimated Length of Stay: 2 weeks PT Treatment/Interventions: Ambulation/gait training;DME/adaptive equipment instruction;Psychosocial support;UE/LE Strength taining/ROM;Balance/vestibular  training;UE/LE Coordination activities;Functional mobility training;Splinting/orthotics;Community reintegration;Neuromuscular re-education;Stair training;Wheelchair propulsion/positioning;Discharge planning;Pain management;Therapeutic Activities;Disease management/prevention;Patient/family education;Therapeutic Exercise PT Transfers Anticipated Outcome(s): mod I PT Locomotion Anticipated Outcome(s): mod I w/c; supervision ambulation PT Recommendation Follow Up Recommendations: Home health PT Patient destination: Home Equipment Details: Pt already owns a tempurpedic bed with head/leg adjustability and R bedrail; 4WW; RW; and transport chair  Skilled Therapeutic Intervention Treatment Session 1: Pt received in bed, agreeable to PT evaluation. PT Evaluation - Pt presents with recent MS exacerbation resulting in progressive B LE weakness (R weaker than L), a fall at home with inability to get up. Pt demonstrates emerging tightness in B gastrocs - PT asked nurse to get [facility] for R ankle while in bed to prevent contracture. L ankle musculature able to DF past neutral on day of eval. Pt demonstrates MS related fatigue, req frequent rest breaks, and is temperature sensitive, getting increasingly weak with overheating. Therapeutic Activity - see below for all bed mobility and transfer details. PT sets car at low sedan height, per pt report of home vehicle, and pt req min A to stand-pivot transfer into car, assist for B LEs in/out of car, and mod A squat-pivot transfer car to w/c due to fatigue. Gait Training - See below for ambulation with Rollator and initiation of stair training. W/C Management - See below for w/c propulsion and parts management details. Therapeutic Exercise - PT instructs pt in B LE A/AROM and strengthening exercises with specific cues to stop and tell PT upon initiation of fatigue: ankle pumps, ankle eversion, heel slides, supine hip abduction/adduction: x  3-5 reps each leg. Pt ended up in  recliner with all needs in reach. Continue per PT POC.     PT Evaluation Precautions/Restrictions Precautions Precautions: Fall Precaution Comments: R ankle may invert; B leg weakness Restrictions Weight Bearing Restrictions: No General Chart Reviewed: Yes Family/Caregiver Present: No  Pain Pain Assessment Pain Assessment: 0-10 Pain Score: 2  Pain Type: Acute pain Pain Location: Abdomen Pain Descriptors / Indicators: Other (Comment) (bloating) Pain Onset: On-going Pain Intervention(s): Rest Multiple Pain Sites: No Home Living/Prior Functioning Home Living Living Arrangements: Spouse/significant other Available Help at Discharge: Family Type of Home: House Home Access: Stairs to enter Technical brewer of Steps: 2 Entrance Stairs-Rails: Right Home Layout: One level Bathroom Shower/Tub: Multimedia programmer: Handicapped height Bathroom Accessibility: Yes Additional Comments: husband is working on building a ramp - may not be completed by time for d/c  Lives With: Spouse;Other (Comment) (2 sons available in the neighborhood and family next door to assist) Prior Function Level of Independence: Requires assistive device for independence  Able to Take Stairs?: Yes (until 2 weeks ago) Driving: No Vocation: On disability Comments: limited distances to waLK; household distance functional mobility, mod I with ADLs Vision/Perception  Perception Comments: appears wfl  Cognition Overall Cognitive Status: Within Functional Limits for tasks assessed Arousal/Alertness: Awake/alert Orientation Level: Oriented X4 Attention: Focused;Sustained Focused Attention: Appears intact Sustained Attention: Appears intact Selective Attention: Appears intact Memory: Appears intact (4/4 words recalled) Awareness: Appears intact Problem Solving: Appears intact Safety/Judgment: Appears intact Sensation Sensation Light Touch: Impaired Detail Light Touch Impaired Details:  Impaired RLE;Impaired LLE Stereognosis: Not tested Stereognosis Impaired Details: Impaired RLE;Impaired LLE Hot/Cold: Not tested Proprioception: Appears Intact Proprioception Impaired Details: Impaired RLE;Impaired LLE Additional Comments: normal sensation at knees, then becomes progressively diminished to LT as you travel more distal Coordination Gross Motor Movements are Fluid and Coordinated: No Fine Motor Movements are Fluid and Coordinated: No Coordination and Movement Description: coordination limited due to B LE weakness (R weaker than L) Finger Nose Finger Test: wfl B UEs Motor  Motor Motor: Motor impersistence;Abnormal tone Motor - Skilled Clinical Observations: low tone in B legs due to weakness  Mobility Bed Mobility Bed Mobility: Rolling Right;Rolling Left;Right Sidelying to Sit Rolling Right: With rail;4: Min assist Rolling Right Details: Manual facilitation for placement;Verbal cues for technique Rolling Left: 3: Mod assist Rolling Left Details: Manual facilitation for placement;Verbal cues for technique Right Sidelying to Sit: With rails;2: Max assist Right Sidelying to Sit Details: Manual facilitation for placement;Verbal cues for technique;Verbal cues for sequencing Transfers Transfers: Yes Sit to Stand: 4: Min assist Sit to Stand Details: Manual facilitation for placement;Verbal cues for technique Sit to Stand Details (indicate cue type and reason): B arms pushing on handles of 4WW Stand to Sit: 4: Min assist Stand to Sit Details (indicate cue type and reason): Manual facilitation for placement;Verbal cues for technique Stand Pivot Transfers: 4: Min assist Stand Pivot Transfer Details: Manual facilitation for placement;Verbal cues for technique Locomotion  Ambulation Ambulation: Yes Ambulation/Gait Assistance: 1: +2 Total assist (min A from PT; +2 follow for w/c follow) Ambulation Distance (Feet): 42 Feet Assistive device: 4-wheeled walker Ambulation/Gait  Assistance Details: Manual facilitation for weight shifting Ambulation/Gait Assistance Details: significant compensatory techniques: hips ER to utilize hip adductors > hip flexors for swing, poor foot clearance = shuffle gait, excessive lateral trunk sway to improve swing, B uncompensated [place] with excessive lateral excursions of 4WW Gait Gait: Yes Gait Pattern: Impaired Gait Pattern: Step-through pattern;Wide base of support;Lateral hip  instability;Shuffle;Decreased dorsiflexion - right;Decreased dorsiflexion - left;Trendelenburg;Poor foot clearance - left;Poor foot clearance - right Stairs / Additional Locomotion Stairs: Yes Stairs Assistance: 4: Min assist Stairs Assistance Details: Manual facilitation for weight shifting;Manual facilitation for placement;Verbal cues for technique Stairs Assistance Details (indicate cue type and reason): excessive lateral swing and hip hiking to assist with foot placement Stair Management Technique: Two rails Number of Stairs: 1 Height of Stairs: 3 Wheelchair Mobility Wheelchair Mobility: Yes Wheelchair Assistance: 5: Investment banker, operational Details: Verbal cues for Marketing executive: Both upper extremities Wheelchair Parts Management: Needs assistance Distance: 40'  Trunk/Postural Assessment  Cervical Assessment Cervical Assessment: Within Functional Limits Thoracic Assessment Thoracic Assessment: Within Functional Limits Lumbar Assessment Lumbar Assessment: Within Functional Limits Postural Control Postural Control: Within Functional Limits  Balance Balance Balance Assessed: Yes Static Sitting Balance Static Sitting - Balance Support: Feet supported;Bilateral upper extremity supported Static Sitting - Level of Assistance: 5: Stand by assistance Dynamic Sitting Balance Dynamic Sitting - Balance Support: Bilateral upper extremity supported;Feet supported Dynamic Sitting - Level of Assistance: 4: Min  assist Static Standing Balance Static Standing - Balance Support: Bilateral upper extremity supported;During functional activity Static Standing - Level of Assistance: 4: Min assist Dynamic Standing Balance Dynamic Standing - Balance Support: Bilateral upper extremity supported;During functional activity Dynamic Standing - Level of Assistance: 4: Min assist Extremity Assessment  RUE Assessment RUE Assessment: Exceptions to [facility] RUE AROM (degrees) Overall AROM Right Upper Extremity: Within functional limits for tasks performed RUE PROM (degrees) Overall PROM Right Upper Extremity: Within functional limits for tasks performed RUE Strength RUE Overall Strength: Deficits RUE Overall Strength Comments: grossly 4/5 LUE Assessment LUE Assessment: Within Functional Limits LUE Strength LUE Overall Strength:  (Grossly 4/5 elbows/hands, 4-/5 at shoulder) RLE Assessment RLE Assessment: Exceptions to [facility] RLE AROM (degrees) Overall AROM Right Lower Extremity: Deficits;Due to decreased strength RLE PROM (degrees) Overall PROM Right Lower Extremity: Within functional limits for tasks assessed RLE Overall PROM Comments: gastrocs getting tight but able to passively stretch past neutral RLE Strength RLE Overall Strength: Deficits RLE Overall Strength Comments: ankle DF 2+/5, ankle eversion 2/5, ankle inversion 2/5, hip flexion 1/5, hip abduction 2-/5, hip adduction 2-/5, quads 4+/5, hamstrings 4-/5 LLE Assessment LLE Assessment: Exceptions to WFL LLE AROM (degrees) Overall AROM Left Lower Extremity: Deficits;Due to decreased strength LLE PROM (degrees) Overall PROM Left Lower Extremity: Within functional limits for tasks assessed LLE Overall PROM Comments: gastroc getting tight, but able to passively DF past neutral LLE Strength LLE Overall Strength: Deficits LLE Overall Strength Comments: ankle DF 3+/5, ankle eversion 3+/5, ankle inversion 3+/5, hip flexion 1/5, hip abduction 2-/5, hip adduction  2-/5, quads 4+/5, hamstrings 4+/5   See Function Navigator for Current Functional Status.   Refer to Care Plan for Long Term Goals  Recommendations for other services: None  Discharge Criteria: Patient will be discharged from PT if patient refuses treatment 3 consecutive times without medical reason, if treatment goals not met, if there is a change in medical status, if patient makes no progress towards goals or if patient is discharged from hospital.  The above assessment, treatment plan, treatment alternatives and goals were discussed and mutually agreed upon: by patient  [facility] M 08/25/2015, 11:51 AM

## 2015-08-25 NOTE — Care Management (Signed)
[facility] Individual Statement of Services  Patient Name:  Gabriela Rice  Date:  08/25/2015  Welcome to the [facility].  Our goal is to provide you with an individualized program based on your diagnosis and situation, designed to meet your specific needs.  With this comprehensive rehabilitation program, you will be expected to participate in at least 3 hours of rehabilitation therapies Monday-Friday, with modified therapy programming on the weekends.  Your rehabilitation program will include the following services:  Physical Therapy (PT), Occupational Therapy (OT), 24 hour per day rehabilitation nursing, Therapeutic Recreaction (TR), Neuropsychology, Case Management (Social Worker), Rehabilitation Medicine, Nutrition Services and Pharmacy Services  Weekly team conferences will be held on Wednesday to discuss your progress.  Your Social Worker will talk with you frequently to get your input and to update you on team discussions.  Team conferences with you and your family in attendance may also be held.  Expected length of stay: 12-15 days  Overall anticipated outcome: mod/i-supervision-ambulation  Depending on your progress and recovery, your program may change. Your Social Worker will coordinate services and will keep you informed of any changes. Your Social Worker's name and contact numbers are listed  below.  The following services may also be recommended but are not provided by the [facility] will be made to provide these services after discharge if needed.  Arrangements include referral to agencies that provide these services.  Your insurance has been verified to be:  [place] [employer] Your primary doctor is:  Darcus Austin  Pertinent information will be shared with your doctor and your insurance  company.  Social Worker:  Ovidio Kin, [place] or ([phone]  Information discussed with and copy given to patient by: Elease Hashimoto, 08/25/2015, 9:32 AM

## 2015-08-25 NOTE — Progress Notes (Signed)
[name], RN Rehab Admission Coordinator Signed Physical Medicine and Rehabilitation PMR Pre-admission 08/23/2015 2:23 PM  Related encounter: ED to Hosp-Admission (Discharged) from 08/20/2015 in [place] Collapse All   PMR Admission Coordinator Pre-Admission Assessment  Patient: Gabriela Rice is an 59 y.o., female MRN: [id number] DOB: May 09, 1956 Height: $RemoveBefo'5\' 4"'pDMMuOByGKZ$  (162.6 cm) Weight: 90.719 kg (200 lb)  Insurance Information HMO: PPO: PCP: IPA: 80/20: OTHER: Group # [id number] PRIMARY: BCBS Policy#: [id number] Subscriber: Gabriela Rice CM Name: Gabriela Rice Phone#: [phone] Fax#: [phone] Pre-Cert#: [id number] with update due on 09/05/15 Employer: Spouse works FT nights Benefits: Phone #: [phone] Name: Verified on line Blue E Eff. Date: 09/30/14 Deduct: $750 (met $750) Out of Pocket Max: $4000 (met $2032.51 Life Max: unlimited CIR: 80% w/auth SNF: 80% w/auth Outpatient: 80% with 30 visit limit Co-Pay: 20% Home Health: 80% Co-Pay: 20% DME: 80% Co-Pay: 20% Providers: in network  SECONDARY: Medicare A/B Policy#: [id number] Subscriber: Gabriela Rice CM Name: Phone#: Fax#:  Pre-Cert#: Employer: Disabled Benefits: Phone #: Name: Automated Eff. Date: 10/31/14 Deduct: $1288 Out of Pocket Max: None Life Max: unlimited CIR: 100% SNF: 100 days Outpatient: 80% Co-Pay: 20% Home Health: 100% Co-Pay: none DME: 80% Co-Pay: 74%  Medicaid Application Date: Case Manager:  Disability Application Date: Case Worker:   Emergency Contact Information Contact Information    Name Relation Home Work Mobile     Gabriela Rice, Gabriela Rice [phone]  [phone]     Current Medical History  Patient Admitting Diagnosis: MS exacerbation  History of Present Illness: a 59 y.o. female with history of DM type 2, autoimmune hepatitis, morbid obesity, anxiety disorder, lumbar spondylosis, MS who was admitted on 11/20 with three day history of increase in BLE weakness, frequent falls and progressive gait problems as well as worsening of abdominal pain. She was started on IV solumedrol for MS exacerbation and CT abdomen/pelvis was negative for diverticulitis or acute abnormalities. Patient declined MRI but was agreeable for CT lumbar spine which revealed left paracentral and foraminal disc extrusion L2/3 likely below L2 nerve root and no spinal stenosis. PT evaluation done yesterday and patient limited by RLE weakness with balance deficits and pain. CIR recommended by MD and rehab team.    Past Medical History  Past Medical History  Diagnosis Date  . DM (diabetes mellitus) ([place])   . Hyperlipidemia   . Multiple sclerosis ([place])   . Allergic rhinitis   . Elevated LFTs   . Obesity   . Vitamin D deficiency   . Abnormality of gait 02/16/2013  . Colon polyps   . Diverticulosis   . Hemorrhoids   . Hepatitis     with cholestasis  . Hemorrhoids   . Autoimmune hepatitis ([place])   . Allergy   . Hypertension   . Autoimmune hepatitis ([place])   . Anxiety   . Migraines     Family History  family history includes Brain cancer in her maternal uncle; Cirrhosis in her father; Colon cancer in her maternal aunt; Diabetes in her mother; Heart disease in her maternal grandmother; Multiple sclerosis in her mother; Uterine cancer in her maternal aunt.  Prior Rehab/Hospitalizations:  Has the patient had major surgery during 100 days prior to admission? No. Patient says she has had steroid injections most recently.  Current Medications   Current  facility-administered medications:  . 0.9 % sodium chloride infusion, , Intravenous, Continuous, Rise Patience, MD, Last Rate: 10 mL/hr at 08/21/15 0035 .  ALPRAZolam Duanne Moron) tablet 1 mg, 1 mg, Oral, QHS, Rise Patience, MD, 1 mg at 08/22/15 2136 . alum & mag hydroxide-simeth (MAALOX/MYLANTA) 200-200-20 MG/5ML suspension 30 mL, 30 mL, Oral, Q4H PRN, Theodis Blaze, MD, 30 mL at 08/23/15 0837 . aspirin EC tablet 81 mg, 81 mg, Oral, Daily, Rise Patience, MD, 81 mg at 08/23/15 1030 . azaTHIOprine (IMURAN) tablet 150 mg, 150 mg, Oral, Daily, Rise Patience, MD, 150 mg at 08/23/15 1030 . cholecalciferol (VITAMIN D) tablet 2,000 Units, 2,000 Units, Oral, Daily, Rise Patience, MD, 2,000 Units at 08/23/15 1029 . clonazePAM (KLONOPIN) tablet 0.25 mg, 0.25 mg, Oral, TID PRN, Florencia Reasons, MD, 0.25 mg at 08/23/15 0837 . enoxaparin (LOVENOX) injection 45 mg, 45 mg, Subcutaneous, Q24H, Rise Patience, MD, 45 mg at 08/23/15 1032 . escitalopram (LEXAPRO) tablet 10 mg, 10 mg, Oral, Daily, Rise Patience, MD, 10 mg at 08/23/15 1029 . gabapentin (NEURONTIN) capsule 300 mg, 300 mg, Oral, Daily, Rise Patience, MD, 300 mg at 08/22/15 2136 . Glatiramer Acetate SOSY 40 mg, 40 mg, Subcutaneous, Q M,W,F, Rise Patience, MD, 40 mg at 08/23/15 1045 . insulin aspart (novoLOG) injection 0-15 Units, 0-15 Units, Subcutaneous, TID WC, Rise Patience, MD, 8 Units at 08/23/15 1236 . loratadine (CLARITIN) tablet 10 mg, 10 mg, Oral, Daily, Rise Patience, MD, 10 mg at 08/23/15 1029 . losartan (COZAAR) tablet 100 mg, 100 mg, Oral, Daily, Rise Patience, MD, 100 mg at 08/23/15 1029 . methylPREDNISolone sodium succinate (SOLU-MEDROL) 1,000 mg in sodium chloride 0.9 % 50 mL IVPB, 1,000 mg, Intravenous, Q24H, Florencia Reasons, MD, 1,000 mg at 08/22/15 2136 . ondansetron (ZOFRAN) tablet 4 mg, 4 mg, Oral, Q6H PRN **OR** ondansetron (ZOFRAN) injection 4 mg, 4 mg, Intravenous,  Q6H PRN, Rise Patience, MD . oxyCODONE-acetaminophen (PERCOCET/ROXICET) 5-325 MG per tablet 1 tablet, 1 tablet, Oral, Q6H PRN, Rise Patience, MD, 1 tablet at 08/21/15 [phone] . pantoprazole (PROTONIX) injection 40 mg, 40 mg, Intravenous, Q24H, Rise Patience, MD, 40 mg at 08/23/15 0021 . polyethylene glycol (MIRALAX / GLYCOLAX) packet 17 g, 17 g, Oral, Daily, Shawn C Joy, PA-C, 17 g at 08/22/15 0858 . polyethylene glycol powder (GLYCOLAX/MIRALAX) container 127.5 g, 0.5 Container, Oral, Once, Rise Patience, MD . pravastatin (PRAVACHOL) tablet 40 mg, 40 mg, Oral, Daily, Rise Patience, MD, 40 mg at 08/23/15 1029 . senna-docusate (Senokot-S) tablet 2 tablet, 2 tablet, Oral, BID, Florencia Reasons, MD, 2 tablet at 08/22/15 [phone] . trimethoprim (TRIMPEX) tablet 100 mg, 100 mg, Oral, Daily, Rise Patience, MD, 100 mg at 08/23/15 1030  Patients Current Diet: Diet heart healthy/carb modified Room service appropriate?: Yes; Fluid consistency:: Thin Diet - low sodium heart healthy  Precautions / Restrictions Precautions Precautions: Fall Precaution Comments: R ankle may invert Restrictions Weight Bearing Restrictions: No   Has the patient had 2 or more falls or a fall with injury in the past year?Yes. Has had about 10 falls in the last year with no injury.  Prior Activity Level Limited Community (1-2x/wk): Most recently, no going out as much due to weakness.  Home Assistive Devices / Equipment Home Assistive Devices/Equipment: Environmental consultant (specify type), Kasandra Knudsen (specify quad or straight), Wheelchair Home Equipment: Environmental consultant - 4 wheels, Shower seat, Grab bars - tub/shower  Prior Device Use: Indicate devices/aids used by the patient prior to current illness, exacerbation or injury? Walker and [employer]  Prior Functional Level Prior Function Level of Independence: Independent with assistive device(s) Comments: limited distances to  waLK; household distance functional mobility, mod I  with ADLs  Self Care: Did the patient need help bathing, dressing, using the toilet or eating? Independent. However husband helps with cooking and food preparation most recently.  Indoor Mobility: Did the patient need assistance with walking from room to room (with or without device)? Independent. Progressed from cane and now using walker with increased weakness most recently.  Stairs: Did the patient need assistance with internal or external stairs (with or without device)? Needed some help  Functional Cognition: Did the patient need help planning regular tasks such as shopping or remembering to take medications? Independent  Current Functional Level Cognition  Overall Cognitive Status: Within Functional Limits for tasks assessed   Extremity Assessment (includes Sensation/Coordination)  Upper Extremity Assessment: Generalized weakness  Lower Extremity Assessment: Defer to PT evaluation RLE Deficits / Details: decreased ankle dorsiflexion and eversion= 2+, knee extension 3/5, hip flexion 3/5 RLE Sensation: decreased light touch LLE Deficits / Details: dorsiflexion 3+, knee ext 4/5, hip flexion 3+. LLE Sensation: decreased light touch    ADLs  Overall ADL's : Needs assistance/impaired Eating/Feeding: Set up, Sitting Grooming: Minimal assistance, Standing, Wash/dry face, Oral care Grooming Details (indicate cue type and reason): stood at sink with recliner behind her to complete 2 simple grooming tasks. Min A for balance. Upper Body Bathing: Set up, Sitting Lower Body Bathing: Moderate assistance, Sit to/from stand, Cueing for compensatory techniques Upper Body Dressing : Set up, Sitting Lower Body Dressing: Moderate assistance, Cueing for compensatory techniques, Sit to/from stand Toilet Transfer: Minimal assistance, +2 for safety/equipment, Stand-pivot, BSC, RW Toilet Transfer Details (indicate cue type and reason): EOB to [facility] with min A for balance, +2 safety/equipment.  Cues for technique with rw.  Toileting- Clothing Manipulation and Hygiene: Moderate assistance, Sit to/from stand Tub/ Banker: Walk-in shower, Stand-pivot, 3 in 1, Rolling walker Functional mobility during ADLs: Moderate assistance, +2 for safety/equipment General ADL Comments: Pt completed bed mobility, SPT to [facility], pivotal steps to recliner, and ADL tasks at sink as detailed above. Pt with BLE weakness RLE>LLE and decreased activity tolerance impacting level of assist with ADLs. +2 s/e for SPT and ambulation.     Mobility  Overal bed mobility: Needs Assistance Bed Mobility: Supine to Sit Supine to sit: Mod assist General bed mobility comments: extra time, cues for technique; assist to advance BLE and light assist to advance trunk; pt with use of bed rails    Transfers  Overall transfer level: Needs assistance Equipment used: Rolling walker (2 wheeled) Transfers: Sit to/from Stand Sit to Stand: Mod assist Stand pivot transfers: Min assist, +2 safety/equipment, From elevated surface General transfer comment: SPT from EOB to [facility]; +2 safety    Ambulation / Gait / Stairs / Wheelchair Mobility       Posture / Balance Balance Overall balance assessment: Needs assistance, History of Falls Sitting-balance support: Bilateral upper extremity supported, Feet supported Sitting balance-Leahy Scale: Fair Standing balance support: Bilateral upper extremity supported, During functional activity Standing balance-Leahy Scale: Poor Standing balance comment: external support for balance    Special needs/care consideration BiPAP/CPAP No CPM No Continuous Drip IV No Dialysis N No Oxygen No Special Bed No Trach Size No Wound Vac (area) No  Skin Bruises on her legs  Bowel mgmt: Last BM 08/22/15 Bladder mgmt: Voids with incontinence. Wears depends. Diabetic mgmt Yes, on oral medication at home. On insulin in the hospital.    Previous  Home Environment Living Arrangements: Spouse/significant other Available Help at Discharge: Family  Type of Home: House Home Layout: One level Home Access: Stairs to enter Entrance Stairs-Rails: Right Entrance Stairs-Number of Steps: 2 Bathroom Shower/Tub: Walk-in shower, Other (comment) ("handicap shower") Home Care Services: No  Discharge Living Setting Plans for Discharge Living Setting: Patient's home, House, Lives with (comment) (Lives with husband.) Type of Home at Discharge: House Discharge Home Layout: One level Discharge Home Access: Stairs to enter Entrance Stairs-Number of Steps: 2 steps at the front entry. Does the patient have any problems obtaining your medications?: No  Social/Family/Support Systems Patient Roles: Spouse, Parent (Has a husband and 2 sons.) Contact Information: Gabriela Rice - spouse Anticipated Caregiver: Husband Anticipated Caregiver's Contact Information: Gabriela Rice - [phone] Ability/Limitations of Caregiver: Husband works third shift. Son lives close by. Caregiver Availability: Other (Comment) (Will be alone at night.) Discharge Plan Discussed with Primary Caregiver: Yes Is Caregiver In Agreement with Plan?: Yes Does Caregiver/Family have Issues with Lodging/Transportation while Pt is in Rehab?: No  Goals/Additional Needs Patient/Family Goal for Rehab: PT/OT supervision to min assist goals Expected length of stay: 12/15 days Cultural Considerations: None Dietary Needs: Heart healthy, carb mod, thin liquids Equipment Needs: TBD Pt/Family Agrees to Admission and willing to participate: Yes Program Orientation Provided & Reviewed with Pt/Caregiver Including Roles & Responsibilities: Yes  Decrease burden of Care through IP rehab admission: N/A  Possible need for SNF placement upon discharge: Not planned  Patient Condition: This patient's medical and functional status has changed since the consult dated: 08/22/15 in which the Rehabilitation  Physician determined and documented that the patient's condition is appropriate for intensive rehabilitative care in an inpatient rehabilitation facility. See "History of Present Illness" (above) for medical update. Functional changes are: Currently requiring mod assist for transfers and min/mod assist for ADLs. Note patient changed her mind and did want to come to inpatient rehab. Patient's medical and functional status update has been discussed with the Rehabilitation physician and patient remains appropriate for inpatient rehabilitation. Will admit to inpatient rehab today.  Preadmission Screen Completed By: [name], 08/23/2015 2:49 PM ______________________________________________________________________  Discussed status with Dr. Posey Pronto on 08/23/15 at 1448 and received telephone approval for admission today.  Admission Coordinator: [name] time1448/Date11/23/16          Cosigned by: Ankit Lorie Phenix, MD at 08/23/2015 2:51 PM  Revision History     Date/Time User Provider Type Action   08/23/2015 2:51 PM Ankit Lorie Phenix, MD Physician Cosign   08/23/2015 2:49 PM [name], RN Rehab Admission Coordinator Sign

## 2015-08-25 NOTE — IPOC Note (Signed)
Overall Plan of Care [facility]) Patient Details Name: Gabriela Rice MRN: [id number] DOB: 07-May-1956  Admitting Diagnosis: MS Exacerbation  Hospital Problems: Principal Problem:   Multiple sclerosis exacerbation ([place]) Active Problems:   Weakness of both legs   Essential hypertension   Type 2 diabetes mellitus with diabetic neuropathy (HCC)   Morbid obesity ([place])   Generalized anxiety disorder   Lumbar spondylosis with myelopathy   Dehydration   Chronic UTI     Functional Problem List: Nursing Bladder, Bowel, Endurance, Motor, Nutrition, Safety, Pain  PT Balance, Endurance, Motor, Pain, Safety, Sensory  OT Balance, Endurance, Pain, Motor, Safety  SLP    TR         Basic ADL's: OT Grooming, Bathing, Dressing, Toileting     Advanced  ADL's: OT Simple Meal Preparation, Light Housekeeping     Transfers: PT Bed Mobility, Bed to Chair, Car, Manufacturing systems engineer, Metallurgist: PT Ambulation, Emergency planning/management officer, Stairs     Additional Impairments: OT None  SLP        TR      Anticipated Outcomes Item Anticipated Outcome  Self Feeding Mod I  Swallowing      Basic self-care  Mod I  Toileting  Mod I   Bathroom Transfers Mod I  Bowel/Bladder  Continent to bowel and bladder with Mod.assisst.  Transfers  mod I  Locomotion  mod I w/c; supervision ambulation  Communication     Cognition     Pain  Less than 3, on 1 to 10 scale.  Safety/Judgment  Free from falls .   Therapy Plan: PT Intensity: Minimum of 1-2 x/day ,45 to 90 minutes PT Frequency: 5 out of 7 days PT Duration Estimated Length of Stay: 2 weeks OT Intensity: Minimum of 1-2 x/day, 45 to 90 minutes OT Frequency: 5 out of 7 days OT Duration/Estimated Length of Stay: 12-15 days         Team Interventions: Nursing Interventions Patient/Family Education, Bladder Management, Bowel Management, Disease Management/Prevention, Medication Management  PT interventions Ambulation/gait  training, DME/adaptive equipment instruction, Psychosocial support, UE/LE Strength taining/ROM, Medical illustrator training, UE/LE Coordination activities, Functional mobility training, Splinting/orthotics, Academic librarian, Neuromuscular re-education, IT trainer, Wheelchair propulsion/positioning, Discharge planning, Pain management, Therapeutic Activities, Disease management/prevention, Barrister's clerk education, Therapeutic Exercise  OT Interventions Training and development officer, DME/adaptive equipment instruction, Functional mobility training, Pain management, Self Care/advanced ADL retraining, Therapeutic Exercise, Therapeutic Activities, Wheelchair propulsion/positioning, Neuromuscular re-education, Patient/family education, Discharge planning, UE/LE Strength taining/ROM  SLP Interventions    TR Interventions    SW/CM Interventions Discharge Planning, Psychosocial Support, Patient/Family Education    Team Discharge Planning: Destination: PT-Home ,OT- Home , SLP-  Projected Follow-up: PT-Home health PT, OT-   , SLP-  Projected Equipment Needs: PT- , OT- To be determined, SLP-  Equipment Details: PT-Pt already owns a tempurpedic bed with head/leg adjustability and R bedrail; 4WW; RW; and transport chair, OT-  Patient/family involved in discharge planning: PT- Patient,  OT-Patient, SLP-   MD ELOS: 12-14 days Medical Rehab Prognosis:  Excellent Assessment: The patient has been admitted for CIR therapies with the diagnosis of MS exacerbation. The team will be addressing functional mobility, strength, stamina, balance, safety, adaptive techniques and equipment, self-care, bowel and bladder mgt, patient and caregiver education, NMR, pain mgt, community reintegration. Goals have been set at mod I for mobility and self-care/ADL's.    Meredith Staggers, MD, FAAPMR      See Team Conference Notes for weekly updates to the plan of care

## 2015-08-25 NOTE — Progress Notes (Signed)
Nutrition Brief Note  Patient identified on the Malnutrition Screening Tool (MST) Report  Per pt she may have lost some weight PTA due to anxiety. She feels that she is eating better now. Meal Completion: 100% at lunch today.  Pt declines any supplements.  Encourage po intake.   Wt Readings from Last 15 Encounters:  08/23/15 190 lb 9.6 oz (86.456 kg)  08/20/15 200 lb (90.719 kg)  07/13/15 200 lb (90.719 kg)  11/08/14 204 lb 9.6 oz (92.806 kg)  08/17/14 200 lb 2 oz (90.776 kg)  07/06/14 198 lb 3.2 oz (89.903 kg)  04/08/14 192 lb (87.091 kg)  12/13/13 177 lb 12.8 oz (80.65 kg)  12/07/13 179 lb (81.194 kg)  09/13/13 171 lb 2 oz (77.622 kg)  08/13/13 173 lb (78.472 kg)  07/08/13 172 lb (78.019 kg)  06/04/13 178 lb (80.74 kg)  04/05/13 179 lb (81.194 kg)  03/31/13 178 lb (80.74 kg)    Body mass index is 32.7 kg/(m^2). Patient meets criteria for obesity class I based on current BMI.   Current diet order is Heart Healthy/CHO Modified, patient is consuming approximately 100% of meals at this time. Labs and medications reviewed.   No nutrition interventions warranted at this time. If nutrition issues arise, please consult RD.   [place], [place], [place] Pager [phone] After Hours Pager

## 2015-08-25 NOTE — Progress Notes (Signed)
Orthopedic Tech Progress Note Patient Details:  Gabriela Rice [birth year] [id number]  Patient ID: Gabriela Rice, female   DOB: 02/07/1956, 59 y.o.   MRN: [id number]   Gabriela Rice 08/25/2015, 1:30 PMCalled [employer] for right Prafo brace.

## 2015-08-25 NOTE — Progress Notes (Signed)
Social Work  Social Work Assessment and Plan  Patient Details  Name: Gabriela Rice MRN: [id number] Date of Birth: 1955-10-07  Today's Date: 08/25/2015  Problem List:  Patient Active Problem List   Diagnosis Date Noted  . Weakness of both legs   . Essential hypertension   . Type 2 diabetes mellitus with diabetic neuropathy ([place])   . Morbid obesity ([place])   . Generalized anxiety disorder   . Lumbar spondylosis with myelopathy   . Dehydration   . Chronic UTI   . Multiple sclerosis exacerbation ([place]) 08/20/2015  . Abdominal pain 08/20/2015  . Autoimmune hepatitis ([place]) 08/20/2015  . Diabetes mellitus type 2, controlled ([place]) 08/20/2015  . Hypertension 08/20/2015  . Right-sided Lumbar Facet Syndrome 07/13/2015  . Lumbar spondylosis 07/13/2015  . Chronic low back pain 07/13/2015  . Trigger point with back pain 07/13/2015  . Myofascial pain syndrome 07/13/2015  . Chronic pain 07/13/2015  . Chronic pain syndrome 07/13/2015  . Generalized weakness 07/13/2015  . Right-sided chronic neck pain 07/13/2015  . Cervical spondylosis 07/13/2015  . DDD (degenerative disc disease), cervical 07/13/2015  . Cervical facet syndrome 07/13/2015  . DDD (degenerative disc disease), lumbar 07/13/2015  . Pain of right lower extremity 07/13/2015  . Chronic radicular lumbar pain 07/13/2015  . Non-insulin dependent type 2 diabetes mellitus ([place]) 07/13/2015  . History of hysterectomy 07/13/2015  . History of C-section 07/13/2015  . DM (diabetes mellitus) ([place]) 07/13/2015  . Hyperlipidemia 07/13/2015  . Obesity 07/13/2015  . Diverticulosis 07/13/2015  . Lumbago 02/02/2014  . Difficulty in walking(719.7) 02/02/2014  . HBP (high blood pressure) 07/11/2013  . Chronic rhinitis 04/08/2013  . Abnormality of gait 02/16/2013  . Elevated liver function tests 02/12/2013  . Abdominal pain, right upper quadrant 02/12/2013  . Nausea alone 02/12/2013  . Dyspnea 02/04/2013  . Hypoxemia, nocturnal only   02/04/2013  . Encounter for long-term (current) use of other medications 10/14/2012  . Multiple sclerosis ([place]) 10/14/2012  . IRRITABLE BOWEL SYNDROME 03/29/2009  . FATTY LIVER DISEASE 03/29/2009  . Nonspecific elevation of levels of transaminase or lactic acid dehydrogenase (LDH) 03/29/2009  . PERSONAL HX COLONIC POLYPS 03/29/2009   Past Medical History:  Past Medical History  Diagnosis Date  . DM (diabetes mellitus) ([place])   . Hyperlipidemia   . Multiple sclerosis ([place])   . Allergic rhinitis   . Elevated LFTs   . Obesity   . Vitamin D deficiency   . Abnormality of gait 02/16/2013  . Colon polyps   . Diverticulosis   . Hemorrhoids   . Hepatitis     with cholestasis  . Hemorrhoids   . Autoimmune hepatitis ([place])   . Allergy   . Hypertension   . Autoimmune hepatitis ([place])   . Anxiety   . Migraines    Past Surgical History:  Past Surgical History  Procedure Laterality Date  . Myoectomy    . Cesarean section      x 2  . Tonsillectomy and adenoidectomy    . Abdominal hysterectomy     Social History:  reports that she quit smoking about 19 years ago. Her smoking use included Cigarettes. She has a 1 pack-year smoking history. She has never used smokeless tobacco. She reports that she does not drink alcohol or use illicit drugs.  Family / Support Systems Marital Status: Married Patient Roles: Spouse, Parent Spouse/Significant Other: Enis Slipper  [phone] Children: two son's whom live close by Other Supports: friends and church members Anticipated Caregiver: Husband Ability/Limitations  of Caregiver: Husband works third shift-10:30-7;30 am Caregiver Availability: Other (Comment) (Alone at night but family close by if needed) Family Dynamics: Close knit family they have two son's both married and live close by. Pt can call on them when husband is not there if needed. She likes being alone at night and doesn't want to bother anyone if not necessary.  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   Abuse/Neglect Physical Abuse: Denies Verbal Abuse: Denies Sexual Abuse: Denies Exploitation of patient/patient's resources: Denies Self-Neglect: Denies  Emotional Status Pt's affect, behavior adn adjustment status: Pt is motivated to improve from this exacerbation and regain her independence.  She reports she could feel this one coming on. She has done well in the past and is hopeful she will do well again. She relies upon herself and her husband to pull her through Recent Psychosocial Issues: Otehr health issues-main is her MS Pyschiatric History: Histroy of anxiety takes medicines for this and feels they help her, she wants to make sure she gets them at the same time due to makes a difference if she doesn't. May benefit from seeing neuro-psych while here. Substance Abuse History: No issues  Patient / Family Perceptions, Expectations & Goals Pt/Family understanding of illness & functional limitations: Pt and husband are able to explain her MS excerabation and talk with the MD daily regarding her treatment plan and therapy goals. She is glad to be here on rehab last step before going home.  Premorbid pt/family roles/activities: Wife, Mother, grandmother, retired, church member, friend, etc Anticipated changes in roles/activities/participation: resume Pt/family expectations/goals: Pt states: " I want to be able to do for myself before I leave here."  Husband states: " We will help but she would rather do on her own."  [employer]: Other (Comment) (MS society) Premorbid Home Care/DME Agencies: Other (Comment) (in the  past) Transportation available at discharge: Husband and family members Resource referrals recommended: Support group (specify), Neuropsychology  Discharge Planning Living Arrangements: Spouse/significant other Support Systems: Spouse/significant other, Children, Other relatives, Friends/neighbors, Church/faith community Type of Residence: Private residence Insurance Resources: Multimedia programmer (specify), Chief Operating Officer) Financial Resources: SSD, Family Support Financial Screen Referred: No Living Expenses: Lives with family Money Management: Spouse, Patient Does the patient have any problems obtaining your medications?: No Home Management: Both depends upon pt's function Patient/Family Preliminary Plans: Return home with husband who can assist but is working at night. They do have son's who are willing to assist if necessary while husband is working. Will await team's evalutions and come up with a safe discharge plan. Social Work Anticipated Follow Up Needs: HH/OP, Support Group  Clinical Impression Pleasant female who is motivated and ready to work in therapies to be able to get back home. She wants her dog to visit and is aware she can as long as shot records are on file. Supportive husband and family willing to assist if  Necessary. Will work toward discharge and await team's evaluations.  Elease Hashimoto 08/25/2015, 10:36 AM

## 2015-08-25 NOTE — Plan of Care (Signed)
Problem: RH SKIN INTEGRITY Goal: RH STG SKIN FREE OF INFECTION/BREAKDOWN Patient's skin should remain free of new infection/breakdown while on Rehab.  Outcome: Progressing Use of MGP in groins

## 2015-08-25 NOTE — Evaluation (Signed)
Occupational Therapy Assessment and Plan  Patient Details  Name: Gabriela Rice MRN: [id number] Date of Birth: 21-May-1956  OT Diagnosis: muscle weakness (generalized) Rehab Potential: Rehab Potential (ACUTE ONLY): Good ELOS: 12-15 days   Today's Date: 08/25/2015 OT Individual Time: 0730-0900 OT Individual Time Calculation (min): 90 min     Problem List:  Patient Active Problem List   Diagnosis Date Noted  . Weakness of both legs   . Essential hypertension   . Type 2 diabetes mellitus with diabetic neuropathy ([place])   . Morbid obesity ([place])   . Generalized anxiety disorder   . Lumbar spondylosis with myelopathy   . Dehydration   . Chronic UTI   . Multiple sclerosis exacerbation ([place]) 08/20/2015  . Abdominal pain 08/20/2015  . Autoimmune hepatitis ([place]) 08/20/2015  . Diabetes mellitus type 2, controlled ([place]) 08/20/2015  . Hypertension 08/20/2015  . Right-sided Lumbar Facet Syndrome 07/13/2015  . Lumbar spondylosis 07/13/2015  . Chronic low back pain 07/13/2015  . Trigger point with back pain 07/13/2015  . Myofascial pain syndrome 07/13/2015  . Chronic pain 07/13/2015  . Chronic pain syndrome 07/13/2015  . Generalized weakness 07/13/2015  . Right-sided chronic neck pain 07/13/2015  . Cervical spondylosis 07/13/2015  . DDD (degenerative disc disease), cervical 07/13/2015  . Cervical facet syndrome 07/13/2015  . DDD (degenerative disc disease), lumbar 07/13/2015  . Pain of right lower extremity 07/13/2015  . Chronic radicular lumbar pain 07/13/2015  . Non-insulin dependent type 2 diabetes mellitus ([place]) 07/13/2015  . History of hysterectomy 07/13/2015  . History of C-section 07/13/2015  . DM (diabetes mellitus) ([place]) 07/13/2015  . Hyperlipidemia 07/13/2015  . Obesity 07/13/2015  . Diverticulosis 07/13/2015  . Lumbago 02/02/2014  . Difficulty in walking(719.7) 02/02/2014  . HBP (high blood pressure) 07/11/2013  . Chronic rhinitis 04/08/2013  . Abnormality of gait  02/16/2013  . Elevated liver function tests 02/12/2013  . Abdominal pain, right upper quadrant 02/12/2013  . Nausea alone 02/12/2013  . Dyspnea 02/04/2013  . Hypoxemia, nocturnal only  02/04/2013  . Encounter for long-term (current) use of other medications 10/14/2012  . Multiple sclerosis ([place]) 10/14/2012  . IRRITABLE BOWEL SYNDROME 03/29/2009  . FATTY LIVER DISEASE 03/29/2009  . Nonspecific elevation of levels of transaminase or lactic acid dehydrogenase (LDH) 03/29/2009  . PERSONAL HX COLONIC POLYPS 03/29/2009    Past Medical History:  Past Medical History  Diagnosis Date  . DM (diabetes mellitus) ([place])   . Hyperlipidemia   . Multiple sclerosis ([place])   . Allergic rhinitis   . Elevated LFTs   . Obesity   . Vitamin D deficiency   . Abnormality of gait 02/16/2013  . Colon polyps   . Diverticulosis   . Hemorrhoids   . Hepatitis     with cholestasis  . Hemorrhoids   . Autoimmune hepatitis ([place])   . Allergy   . Hypertension   . Autoimmune hepatitis ([place])   . Anxiety   . Migraines    Past Surgical History:  Past Surgical History  Procedure Laterality Date  . Myoectomy    . Cesarean section      x 2  . Tonsillectomy and adenoidectomy    . Abdominal hysterectomy      Assessment & Plan Clinical Impression: Patient is a 59 y.o. year old female with history of DM type 2, autoimmune hepatitis, morbid obesity, anxiety disorder, lumbar spondylosis, MS who was admitted on 11/20 with three day history of increase in BLE weakness, frequent falls and progressive gait  problems as well as worsening of abdominal pain. She was started on IV solumedrol for MS exacerbation and CT abdomen/pelvis was negative for diverticulitis or acute abnormalities. Patient declined MRI but was agreeable for CT lumbar spine which revealed left paracentral and foraminal disc extrusion L2/3 likely below L2 nerve root and no spinal stenosis.    Patient transferred to CIR on 08/23/2015 .    Patient  currently requires mod with basic self-care skills secondary to muscle weakness and unbalanced muscle activation.  Prior to hospitalization, patient could complete BADL independently.   Patient will benefit from skilled intervention to increase independence with basic self-care skills prior to discharge home with care partner.  Anticipate patient will require intermittent supervision and follow up outpatient.  OT - End of Session Activity Tolerance: Tolerates 10 - 20 min activity with multiple rests Endurance Deficit: Yes OT Assessment Rehab Potential (ACUTE ONLY): Good OT Patient demonstrates impairments in the following area(s): Balance;Endurance;Pain;Motor;Safety OT Basic ADL's Functional Problem(s): Grooming;Bathing;Dressing;Toileting OT Advanced ADL's Functional Problem(s): Simple Meal Preparation;Light Housekeeping OT Transfers Functional Problem(s): Toilet;Tub/Shower OT Additional Impairment(s): None OT Plan OT Intensity: Minimum of 1-2 x/day, 45 to 90 minutes OT Frequency: 5 out of 7 days OT Duration/Estimated Length of Stay: 12-15 days OT Treatment/Interventions: Teacher, English as a foreign language;Functional mobility training;Pain management;Self Care/advanced ADL retraining;Therapeutic Exercise;Therapeutic Activities;Wheelchair propulsion/positioning;Neuromuscular re-education;Patient/family education;Discharge planning;UE/LE Strength taining/ROM OT Self Feeding Anticipated Outcome(s): Mod I OT Basic Self-Care Anticipated Outcome(s): Mod I OT Toileting Anticipated Outcome(s): Mod I OT Bathroom Transfers Anticipated Outcome(s): Mod I OT Recommendation Patient destination: Home Equipment Recommended: To be determined    Skilled Therapeutic Intervention OT 1:1 evaluation completed with treatment provided to address transfers (bed, w/c, toilet, shower), functional mobility, LE management, AE training, w/c management/mobility training, and pt ed on  methods and goals of treatment.   Pt required extra time and mod-max assist to manage LE during bed mobility.  Pt completed shower level bating but was unable to dress d/t need for bladder scan after unsuccessful attempt to void urine.   Pt left in bed with RN tech present to assist.  OT Evaluation Precautions/Restrictions  Precautions Precautions: Fall Precaution Comments: R ankle may invert Restrictions Weight Bearing Restrictions: No General Chart Reviewed: Yes Family/Caregiver Present: No Vital Signs Therapy Vitals Temp: 98.6 F (37 C) Temp Source: Oral Pulse Rate: 62 Resp: 18 BP: 118/72 mmHg Patient Position (if appropriate): Lying Oxygen Therapy SpO2: 94 % O2 Device: Not Delivered Pain Pain Assessment Pain Assessment: 0-10 Pain Score: 1  Pain Type: Chronic pain Pain Location: Back Pain Intervention(s): Medication (See eMAR) Home Living/Prior Functioning Home Living Family/patient expects to be discharged to:: Private residence Living Arrangements: Spouse/significant other Available Help at Discharge: Family Type of Home: House Home Access: Stairs to enter Technical brewer of Steps: 2 Entrance Stairs-Rails: Right Home Layout: One level Bathroom Shower/Tub: Gaffer, Other (comment) Bathroom Toilet: Handicapped height Bathroom Accessibility: Yes  Lives With: Spouse, Other (Comment) (sons and neighbors available and supportive) IADL History Homemaking Responsibilities: Yes Meal Prep Responsibility: Secondary Laundry Responsibility: Primary Cleaning Responsibility: Secondary Bill Paying/Finance Responsibility: Primary Shopping Responsibility: Secondary Child Care Responsibility: Secondary (4 hy/o, Charlie Advertising account executive)) [employer] Comments: grandaughter does heavy housework Current License: Yes Architectural technologist:  (hasn't driven a car in a year) Education: HS Occupation: On disability Leisure and Hobbies: Watch TV, play games on tablet,  shop using store scooter Prior Function Level of Independence: Independent with basic ADLs, Requires assistive device for independence  Able to Take Stairs?: No (since 2 weeks  PTA) Driving: No Vocation: On disability  ADL See Functional Tool  Vision/Perception  Vision- History Baseline Vision/History: Wears glasses Wears Glasses: Distance only Vision- Assessment Vision Assessment?: No apparent visual deficits Perception Comments: WFL  Cognition Overall Cognitive Status: Within Functional Limits for tasks assessed Arousal/Alertness: Awake/alert Orientation Level: Person;Place;Situation Person: Oriented Place: Oriented Situation: Oriented Year: 2016 Month: November Day of Week: Correct Memory: Appears intact Immediate Memory Recall: Sock;Blue;Bed Memory Recall: Sock;Blue;Bed Memory Recall Sock: Without Cue Memory Recall Blue: Without Cue Memory Recall Bed: Without Cue Attention: Selective Selective Attention: Appears intact Awareness: Appears intact Problem Solving: Appears intact Safety/Judgment: Appears intact Sensation Sensation Light Touch: Impaired Detail Light Touch Impaired Details: Impaired RLE;Impaired LLE Stereognosis: Impaired Detail Stereognosis Impaired Details: Impaired RLE;Impaired LLE Hot/Cold: Appears Intact Proprioception: Impaired Detail Proprioception Impaired Details: Impaired RLE;Impaired LLE Additional Comments: WFL for BUE Coordination Gross Motor Movements are Fluid and Coordinated: No Fine Motor Movements are Fluid and Coordinated: No Coordination and Movement Description: Impiared BLE; WFL for BUE Motor  Motor Motor: Motor impersistence Mobility  Bed Mobility Bed Mobility: Rolling Right;Rolling Left;Right Sidelying to Sit Rolling Right: With rail;4: Min assist Rolling Right Details: Manual facilitation for placement;Verbal cues for technique Rolling Left: 3: Mod assist Rolling Left Details: Manual facilitation for placement;Verbal  cues for technique Right Sidelying to Sit: With rails;2: Max assist Right Sidelying to Sit Details: Manual facilitation for placement;Verbal cues for technique;Verbal cues for sequencing Transfers Sit to Stand: 4: Min assist Sit to Stand Details: Manual facilitation for placement;Verbal cues for technique Sit to Stand Details (indicate cue type and reason): B arms pushing on handles of 4WW Stand to Sit: 4: Min assist Stand to Sit Details (indicate cue type and reason): Manual facilitation for placement;Verbal cues for technique  Trunk/Postural Assessment  Cervical Assessment Cervical Assessment: Within Functional Limits Thoracic Assessment Thoracic Assessment: Within Functional Limits Lumbar Assessment Lumbar Assessment: Within Functional Limits Postural Control Postural Control: Within Functional Limits  Balance Balance Balance Assessed: Yes Static Sitting Balance Static Sitting - Balance Support: Feet supported;Bilateral upper extremity supported Static Sitting - Level of Assistance: 5: Stand by assistance Dynamic Sitting Balance Dynamic Sitting - Balance Support: Bilateral upper extremity supported;Feet supported Dynamic Sitting - Level of Assistance: 4: Min assist Static Standing Balance Static Standing - Balance Support: Bilateral upper extremity supported;During functional activity Static Standing - Level of Assistance: 4: Min assist Dynamic Standing Balance Dynamic Standing - Balance Support: Bilateral upper extremity supported;During functional activity Dynamic Standing - Level of Assistance: 4: Min assist Extremity/Trunk Assessment RUE Assessment RUE Assessment: Exceptions to [facility] RUE Strength RUE Overall Strength: Other (Comment) (Grossly 4/5 elbows/hands, 4-/5 at shoulder) LUE Assessment LUE Assessment: Exceptions to [facility] LUE Strength LUE Overall Strength:  (Grossly 4/5 elbows/hands, 4-/5 at shoulder)   See Function Navigator for Current Functional Status.    Refer to Care Plan for Long Term Goals  Recommendations for other services: None  Discharge Criteria: Patient will be discharged from OT if patient refuses treatment 3 consecutive times without medical reason, if treatment goals not met, if there is a change in medical status, if patient makes no progress towards goals or if patient is discharged from hospital.  The above assessment, treatment plan, treatment alternatives and goals were discussed and mutually agreed upon: by patient   Second session: Time: 1300-1330 Time Calculation (min): 30 min  Pain Assessment: No/denies pain  Skilled Therapeutic Interventions: Therapeutic activity with focus on AE training (reacher and sock aid to doff/don socks), grip/pinch assessment and strengthening exercises.  After demonstration pt completed doffing and donning her socks using AE with min instructional cues to problem solve.   Pt demo'd some difficulty donning socks on AE and admitted to mild weakness at right hand as limiting performance.   Grip/Pinch assessed as follows using hand dyno and pinch gage:  Grip  R = 25 lbs (average of 3)  L= 20 lbs "   "  Pinch     R       L  Key  10 8  Tip     5 5  Triopd    8 5  Pt then completes 4 exercises using yellow (med soft) putty with mod instructional cues and demonstration.   See FIM for current functional status  Therapy/Group: Individual Therapy  , 08/25/2015, 1:47 PM

## 2015-08-25 NOTE — Progress Notes (Addendum)
[place] PHYSICAL MEDICINE & REHABILITATION     PROGRESS NOTE    Subjective/Complaints: Pt states she had a better night last night and was able to get good rest.  She does not that she still has a lot of anxiety and wants to know if her medications can be adjusted.     ROS: Denies CP, SOB, n/v/d.   Objective: Vital Signs: Blood pressure 118/72, pulse 62, temperature 98.6 F (37 C), temperature source Oral, resp. rate 18, height 5\' 4"  (1.626 m), weight 86.456 kg (190 lb 9.6 oz), SpO2 94 %. No results found.  Recent Labs  08/23/15 0540 08/24/15 0448  WBC 6.2 5.3  HGB 12.9 12.5  HCT 37.7 36.2  PLT 226 203    Recent Labs  08/23/15 0540 08/24/15 0448  NA 139 138  K 4.2 4.3  CL 102 103  GLUCOSE 236* 257*  BUN 31* 27*  CREATININE 0.86 0.86  CALCIUM 9.5 9.5   CBG (last 3)   Recent Labs  08/24/15 1132 08/24/15 1728 08/24/15 2129  GLUCAP 234* 235* 132*    Wt Readings from Last 3 Encounters:  08/23/15 86.456 kg (190 lb 9.6 oz)  08/20/15 90.719 kg (200 lb)  07/13/15 90.719 kg (200 lb)    Physical Exam:  GEN: no distress. Vital signs reviewed.  HENT: oral mucosa moist Head: Normocephalic and atraumatic.  Eyes: Conjunctivae and EOM are normal.   Cardiovascular: Normal rate and regular rhythm.  No murmur heard. Respiratory: Effort normal and breath sounds normal. No respiratory distress. She has no wheezes.  GI: Soft. Bowel sounds are normal. She exhibits no distension. There is no tenderness.  Musculoskeletal: She exhibits no edema or tenderness.  Neurological: She is alert and oriented.  Speech clear.  Follows basic commands without difficulty.  Moves BUE 5/5 proximal to distal RLE hip flexion 2-/5, knee extention 4/5, dorsiflexion 3+/5, plantarflexion 4/5  LLE hip flexion 2/5, knee extention 4/5, dorsiflexion 3+/5, plantarflexion 4/5  Extensor tone LLE.  Skin: Skin is warm and dry. She is not diaphoretic. There is erythema.  Psychiatric: She has  a normal mood and affect. She is a little anxious  Assessment/Plan: 1. Functional deficits secondary to MS exacerbation with weakness,sensory loss, balance deficits which require 3+ hours per day of interdisciplinary therapy in a comprehensive inpatient rehab setting. Physiatrist is providing close team supervision and 24 hour management of active medical problems listed below. Physiatrist and rehab team continue to assess barriers to discharge/monitor patient progress toward functional and medical goals.  Function:  Bathing Bathing position      Bathing parts      Bathing assist        Upper Body Dressing/Undressing Upper body dressing                    Upper body assist        Lower Body Dressing/Undressing Lower body dressing                                  Lower body assist        Toileting Toileting Toileting activity did not occur: No continent bowel/bladder event        Toileting assist     Transfers Chair/bed Clinical biochemist  Cognition Comprehension Comprehension assist level: Follows complex conversation/direction with extra time/assistive device  Expression Expression assist level: Expresses complex ideas: With extra time/assistive device  Social Interaction Social Interaction assist level: Interacts appropriately with others - No medications needed.  Problem Solving Problem solving assist level: Solves complex 90% of the time/cues < 10% of the time  Memory Memory assist level: Complete Independence: No helper   Medical Problem List and Plan: 1. Functional deficits secondary to MS exacerbation with RLE weakness, balance deficits and poor safety awareness.  -Begin CIR 2. DVT Prophylaxis/Anticoagulation: Pharmaceutical: Lovenox 3. Pain Management: On neurontin tid for neuropathy. Continue to use oxycodone prn for pain.--controlled at present  4. Mood: Team to  provide ego support and education. LCSW to follow for evaluation and support.  5. Neuropsych: This patient is capable of making decisions on her own behalf. 6. Skin/Wound Care: Routine pressure relief measures 7. Fluids/Electrolytes/Nutrition: Monitor I/O. Encouraged patient to push po fluids. Offer supplements prn poor intake 8. Autoimmune hepatitis: On imuran daily. Continues to have intermittent nausea with poor po intake.  9. Anxiety disorder: Was started on Lexapro a week PTA.   - Will d/c klonopin and increase frequency of home Xanax  - Cont to monitor 10. HTN: monitor BP bid. Continue Cozaar daily.  11. Dehydration: Encouraging PO fluids. BUN slightly improved/holding---recheck over weekend. .   12. DM type 2: Monitor BS ac/hs. Resumed metformin. Will cont to monitor.  13. Chronic UTIs: On trimpex for prophylaxis.   LOS (Days) 2 A FACE TO FACE EVALUATION WAS PERFORMED  Haydon Dorris Lorie Phenix 08/25/2015 7:23 AM

## 2015-08-26 ENCOUNTER — Inpatient Hospital Stay (HOSPITAL_COMMUNITY): Payer: BLUE CROSS/BLUE SHIELD | Admitting: *Deleted

## 2015-08-26 ENCOUNTER — Inpatient Hospital Stay (HOSPITAL_COMMUNITY): Payer: BLUE CROSS/BLUE SHIELD

## 2015-08-26 ENCOUNTER — Inpatient Hospital Stay (HOSPITAL_COMMUNITY): Payer: BLUE CROSS/BLUE SHIELD | Admitting: Occupational Therapy

## 2015-08-26 DIAGNOSIS — R29898 Other symptoms and signs involving the musculoskeletal system: Secondary | ICD-10-CM

## 2015-08-26 LAB — URINALYSIS, ROUTINE W REFLEX MICROSCOPIC
Bilirubin Urine: NEGATIVE
Glucose, UA: 250 mg/dL — AB
Ketones, ur: 15 mg/dL — AB
Nitrite: NEGATIVE
PH: 5.5 (ref 5.0–8.0)
Protein, ur: NEGATIVE mg/dL
SPECIFIC GRAVITY, URINE: 1.028 (ref 1.005–1.030)

## 2015-08-26 LAB — GLUCOSE, CAPILLARY
GLUCOSE-CAPILLARY: 174 mg/dL — AB (ref 65–99)
Glucose-Capillary: 133 mg/dL — ABNORMAL HIGH (ref 65–99)
Glucose-Capillary: 179 mg/dL — ABNORMAL HIGH (ref 65–99)
Glucose-Capillary: 124 mg/dL — ABNORMAL HIGH (ref 65–99)

## 2015-08-26 LAB — URINE MICROSCOPIC-ADD ON

## 2015-08-26 MED ORDER — SORBITOL 70 % SOLN
60.0000 mL | Status: AC
  Administered 2015-08-26: 60 mL via ORAL
  Filled 2015-08-26 (×2): qty 60

## 2015-08-26 NOTE — Progress Notes (Addendum)
Occupational Therapy Session Note  Patient Details  Name: Gabriela Rice MRN: [id number] Date of Birth: [birth year]  Today's Date: 08/26/2015 OT Individual Time:  - 8:00-9:00 (74min)      Short Term Goals: Week 1:  OT Short Term Goal 1 (Week 1): Pt will complete toilet transfer with supervision OT Short Term Goal 2 (Week 1): Pt will dress upper body with min assist OT Short Term Goal 3 (Week 1): Pt will dress lower body with mod assist OT Short Term Goal 4 (Week 1): Pt will maintain supported standing balance with steadying assist during assisted lower body dressing OT Short Term Goal 5 (Week 1): Pt will complete HEP to improve grip/pinch strength with supervision  Skilled Therapeutic Interventions/Progress Updates:    1:1 self care retraining at shower level.  Focus on bed mobility (with HOB slightly elevated), sit to stand, functional ambulation with Rolator, toilet transfer with BSC over commode, shower stall transfer with tub bench with zero entry, activity tolerance, education on energy conservation, d/c planning, etc.  Pt reports she has a zero entry shower at home with seat. Pt reports having incontinent episodes due to decr sensation throughout day. Pt initially able to more easily advance her feet during functional ambulation with Rolator at the beginning of session; however pt with more difficulty advancing right LE toward the end of the session.  Donned shoes with total A and ambulated from the sink to the recliner and needed A to advance right LE each time due to inability to slide foot along foot- difficulty with hip flexion against gravity in standing position. Pt left in recliner at end of session.   Therapy Documentation Precautions:  Precautions Precautions: Fall Precaution Comments: R ankle may invert; B leg weakness Restrictions Weight Bearing Restrictions: No Pain: No c/o pain   See Function Navigator for Current Functional Status.   Therapy/Group: Individual  Therapy  Willeen Cass [facility] 08/26/2015, 8:12 AM

## 2015-08-26 NOTE — Progress Notes (Signed)
Occupational Therapy Session Note  Patient Details  Name: Gabriela Rice MRN: [id number] Date of Birth: 1956/03/09  Today's Date: 08/26/2015 OT Individual Time: 1300-1345 OT Individual Time Calculation (min): 45 min    Short Term Goals: Week 1:  OT Short Term Goal 1 (Week 1): Pt will complete toilet transfer with supervision OT Short Term Goal 2 (Week 1): Pt will dress upper body with min assist OT Short Term Goal 3 (Week 1): Pt will dress lower body with mod assist OT Short Term Goal 4 (Week 1): Pt will maintain supported standing balance with steadying assist during assisted lower body dressing OT Short Term Goal 5 (Week 1): Pt will complete HEP to improve grip/pinch strength with supervision  Skilled Therapeutic Interventions/Progress Updates:    1:1 Pt in bed when arrived. Focus on bed mobility to come to EOB with leg lifter with instructional cues and min A. At EOB used reacher to thread pants to don - however still required A to thread each pant leg. Pt with difficulty with squat pivot transfers due to bilateral LE weakness but performed stand pivot transfers safer and with less difficulty. Pt transitioned to the gym and performed 4 minutes on the NuStep and reported it was very taxing on her.  Pt required mod VC to maintain hip adduction and A with positioning of right LE. Pt kept reporting "I wasn't like this at home." discussed her MS flare and her current rehab goals. Tighten her brakes on her Rolator. Pt did not tolerate a lot of unsupported seated UB exercise due to her premorbid back issues. Returned to room in her chair propelling self half way back.   Therapy Documentation Precautions:  Precautions Precautions: Fall Precaution Comments: R ankle may invert; B leg weakness Restrictions Weight Bearing Restrictions: No Pain:  ongoing back pain - helped with repositioning   See Function Navigator for Current Functional Status.   Therapy/Group: Individual  Therapy  Willeen Cass [facility] 08/26/2015, 3:44 PM

## 2015-08-26 NOTE — Progress Notes (Signed)
PHYSICAL MEDICINE & REHABILITATION     PROGRESS NOTE    Subjective/Complaints: Frustrated that she had urinary urgency last night with some incontinence. States she hadn't had it to this degree at home PTA. Has had some constipation as well. Therapy went well yesterday.      ROS: Denies CP, SOB, n/v/d.   Objective: Vital Signs: Blood pressure 117/68, pulse 79, temperature 97.9 F (36.6 C), temperature source Oral, resp. rate 18, height 5\' 4"  (1.626 m), weight 86.456 kg (190 lb 9.6 oz), SpO2 99 %. No results found.  Recent Labs  08/24/15 0448  WBC 5.3  HGB 12.5  HCT 36.2  PLT 203    Recent Labs  08/24/15 0448  NA 138  K 4.3  CL 103  GLUCOSE 257*  BUN 27*  CREATININE 0.86  CALCIUM 9.5   CBG (last 3)   Recent Labs  08/25/15 1632 08/25/15 2134 08/26/15 0657  GLUCAP 222* 163* 133*    Wt Readings from Last 3 Encounters:  08/23/15 86.456 kg (190 lb 9.6 oz)  08/20/15 90.719 kg (200 lb)  07/13/15 90.719 kg (200 lb)    Physical Exam:  GEN: no distress. Vital signs reviewed.  HENT: oral mucosa moist Head: Normocephalic and atraumatic.  Eyes: Conjunctivae and EOM are normal.   Cardiovascular: Normal rate and regular rhythm.  No murmur heard. Respiratory: Effort normal and breath sounds normal. No respiratory distress. She has no wheezes.  GI: Soft. Bowel sounds are normal. She exhibits no distension. There is no tenderness.  Musculoskeletal: She exhibits no edema or tenderness.  Neurological: She is alert and oriented.  Speech clear.  Follows basic commands without difficulty.  Moves BUE 5/5 proximal to distal RLE hip flexion 2-/5, knee extention 4/5, dorsiflexion 3+/5, plantarflexion 4/5  LLE hip flexion 2/5, knee extention 4/5, dorsiflexion 3+/5, plantarflexion 4/5  Extensor tone LLE.  Skin: Skin is warm and dry. She is not diaphoretic. There is erythema.  Psychiatric: She has a normal mood and affect. She is a little  anxious  Assessment/Plan: 1. Functional deficits secondary to MS exacerbation with weakness,sensory loss, balance deficits which require 3+ hours per day of interdisciplinary therapy in a comprehensive inpatient rehab setting. Physiatrist is providing close team supervision and 24 hour management of active medical problems listed below. Physiatrist and rehab team continue to assess barriers to discharge/monitor patient progress toward functional and medical goals.  Function:  Bathing Bathing position   Position: Shower  Bathing parts Body parts bathed by patient: Right arm, Left arm, Chest, Abdomen, Front perineal area, Buttocks, Right upper leg, Left upper leg, Right lower leg, Left lower leg, Back    Bathing assist Assist Level: Supervision or verbal cues      Upper Body Dressing/Undressing Upper body dressing   What is the patient wearing?: Hospital gown                Upper body assist        Lower Body Dressing/Undressing Lower body dressing   What is the patient wearing?: Hospital Gown, Non-skid slipper socks           Non-skid slipper socks- Performed by helper: Don/doff right sock, Don/doff left sock                  Lower body assist Assist for lower body dressing: Supervision or verbal cues      Toileting Toileting Toileting activity did not occur: No continent bowel/bladder event Toileting steps completed by patient: Adjust  clothing prior to toileting, Performs perineal hygiene (2/2 steps)      Toileting assist Assist level: Touching or steadying assistance (Pt.75%)   Transfers Chair/bed transfer   Chair/bed transfer method: Stand pivot Chair/bed transfer assist level: Moderate assist (Pt 50 - 74%/lift or lower) (min A on ascent & descent) Chair/bed transfer assistive device: Medical sales representative     Max distance: 91' Assist level: 2 helpers   Wheelchair   Type: Manual Max wheelchair distance: 40' Assist Level:  Supervision or verbal cues  Cognition Comprehension Comprehension assist level: Understands complex 90% of the time/cues 10% of the time  Expression Expression assist level: Expresses complex ideas: With extra time/assistive device  Social Interaction Social Interaction assist level: Interacts appropriately with others with medication or extra time (anti-anxiety, antidepressant). (daily antidepressant and anti-anxiety medication)  Problem Solving Problem solving assist level: Solves basic 90% of the time/requires cueing < 10% of the time  Memory Memory assist level: Complete Independence: No helper   Medical Problem List and Plan: 1. Functional deficits secondary to MS exacerbation with RLE weakness, balance deficits and poor safety awareness.  -continue CIR therapies 2. DVT Prophylaxis/Anticoagulation: Pharmaceutical: Lovenox 3. Pain Management: On neurontin tid for neuropathy. Continue to use oxycodone prn for pain.--controlled at present  4. Mood: Team to provide ego support and education. LCSW to follow for evaluation and support.  5. Neuropsych: This patient is capable of making decisions on her own behalf. 6. Skin/Wound Care: Routine pressure relief measures 7. Fluids/Electrolytes/Nutrition: Monitor I/O. Encouraged patient to push po fluids. Offer supplements prn poor intake 8. Autoimmune hepatitis: On imuran daily. Continues to have intermittent nausea with poor po intake.  9. Anxiety disorder: Was started on Lexapro a week PTA.   - xanax TID  - Cont to monitor 10. HTN: monitor BP bid. Continue Cozaar daily.  11. Dehydration: Encouraging PO fluids. BUN slightly improved/holding---recheck over weekend. .   12. DM type 2: Monitor BS ac/hs. Resumed metformin.   May need further increase of metformin--follow for pattern 13. Chronic UTIs: On trimpex for prophylaxis.  -check ua/cx today as having urgency/incontinence now  -check pvr's to establish bladder emptying  14.  Constipation: sorbitol today in addition to previous meds  LOS (Days) 3 A FACE TO FACE EVALUATION WAS PERFORMED  Vale Mousseau T 08/26/2015 11:20 AM

## 2015-08-26 NOTE — Progress Notes (Signed)
Orthopedic Tech Progress Note Patient Details:  LAURINA GUIA December 12, 1955 [id number]  Patient ID: [name], female   DOB: 04-24-1956, 59 y.o.   MRN: [id number]   [name] 08/26/2015, 10:32 AMCalled [employer] for right PRAFO brace.

## 2015-08-26 NOTE — Progress Notes (Signed)
Physical Therapy Session Note  Patient Details  Name: Gabriela Rice MRN: [id number] Date of Birth: [birth year]  Today's Date: 08/26/2015 PT Individual Time: 1400-1530 PT Individual Time Calculation (min): 90 min   Short Term Goals: Week 1:  PT Short Term Goal 1 (Week 1): Pt will demonstrates R side lie to sit transfer req mod A.  PT Short Term Goal 2 (Week 1): Pt will transfer consistently with min A.  PT Short Term Goal 3 (Week 1): Pt will ambulate x 60' with RW or 4WW req min A.  PT Short Term Goal 4 (Week 1): Pt will ascend/descend 2 stairs with B rails req min A.  PT Short Term Goal 5 (Week 1): Pt will transfer in/out of car with min A.   Skilled Therapeutic Interventions/Progress Updates:  Tx focused on functional mobility training, therex for strengthening and activity tolerance, WC propulsion, and static standing in [place]. Pt was quite fatigued by this afternoon, but was agreeable to modified tx in supine with cold packs and supported sitting. Added basic back for support.   Pt performed WC propulsion x150' with S and cues for efficiency and turning.   Squat-pivot transfers x2 with Max lifting and lowering assist, cues for technique and assist for positioning feet. Pt did well with Bobath method on way back to [facility] and manual facilitation for anterior translation over BOS. Sit<>supine on mat with up to Max A for bil LE and trunk lifting, NMR at trunk during transitional movements.   Pt instructed in supine therex with AAROM prn for each of the following x10, monitoring fatigue. Ankle pumps, quad/glute sets, isometric hip ADD (very weak), heel slides AAROM, SAQ, bridging, and hip ABD. Pt given handout.Seated LAQ.    Pt was incontinent of bladder, so returned to room for changing in standing position. Pt fearful of falling, so used [place] at sink. Pt needed up to Max lifting/lowering assist, but was able to remain standing in [place] 2x20min and 1x31min with postural NMR cues. Assist needed  for all clothing and cleansing.   Pt left in bed with all needs in reach.       Therapy Documentation Precautions:  Precautions Precautions: Fall Precaution Comments: R ankle may invert; B leg weakness Restrictions Weight Bearing Restrictions: No General:   Vital Signs: Therapy Vitals Temp: 97.4 F (36.3 C) Temp Source: Oral Pulse Rate: (!) 101 Resp: 18 BP: 118/73 mmHg Patient Position (if appropriate): Sitting Oxygen Therapy SpO2: 95 % O2 Device: Not Delivered Pain: 5/10 back pain, cold pack provided.     See Function Navigator for Current Functional Status.  Therapy/Group: Individual Therapy   Kennieth Rad, PT, DPT  08/26/2015, 3:24 PM

## 2015-08-26 NOTE — Progress Notes (Signed)
VASCULAR LAB PRELIMINARY  PRELIMINARY  PRELIMINARY  PRELIMINARY  Bilateral lower extremity venous duplex completed.    Preliminary report:  Bilateral:  No evidence of DVT, superficial thrombosis, or Baker's Cyst.   Idalys Konecny, RVS 08/26/2015, 12:24 PM

## 2015-08-27 ENCOUNTER — Inpatient Hospital Stay (HOSPITAL_COMMUNITY): Payer: BLUE CROSS/BLUE SHIELD | Admitting: Occupational Therapy

## 2015-08-27 ENCOUNTER — Inpatient Hospital Stay (HOSPITAL_COMMUNITY): Payer: BLUE CROSS/BLUE SHIELD | Admitting: Physical Therapy

## 2015-08-27 LAB — URINE CULTURE

## 2015-08-27 LAB — GLUCOSE, CAPILLARY
GLUCOSE-CAPILLARY: 186 mg/dL — AB (ref 65–99)
Glucose-Capillary: 133 mg/dL — ABNORMAL HIGH (ref 65–99)
Glucose-Capillary: 178 mg/dL — ABNORMAL HIGH (ref 65–99)
Glucose-Capillary: 131 mg/dL — ABNORMAL HIGH (ref 65–99)

## 2015-08-27 MED ORDER — SULFAMETHOXAZOLE-TRIMETHOPRIM 800-160 MG PO TABS
1.0000 | ORAL_TABLET | Freq: Two times a day (BID) | ORAL | Status: DC
  Administered 2015-08-27 – 2015-08-30 (×7): 1 via ORAL
  Filled 2015-08-27 (×9): qty 1

## 2015-08-27 MED ORDER — SORBITOL 70 % SOLN
60.0000 mL | Status: AC
  Administered 2015-08-27: 60 mL via ORAL
  Filled 2015-08-27: qty 60

## 2015-08-27 NOTE — Progress Notes (Addendum)
Physical Therapy Note  Patient Details  Name: Gabriela Rice MRN: [id number] Date of Birth: May 26, 1956 Today's Date: 08/27/2015    Time: 1000 Time 2: 1300 Pt missed 60 minutes skilled PTx 2 (total 120 minutes) due to pt having upset stomach, bowel issues. RN aware. Pt states she hopes to do therapy tomorrow Keenan Dimitrov 08/27/2015, 12:39 PM

## 2015-08-27 NOTE — Progress Notes (Signed)
[place] PHYSICAL MEDICINE & REHABILITATION     PROGRESS NOTE    Subjective/Complaints: Minimal results with bowels yesterday. Having a lot of gas. bladder still frequent.    ROS: Denies CP, SOB, n/v/d.   Objective: Vital Signs: Blood pressure 110/60, pulse 86, temperature 98.6 F (37 C), temperature source Oral, resp. rate 18, height 5\' 4"  (1.626 m), weight 86.456 kg (190 lb 9.6 oz), SpO2 95 %. No results found. No results for input(s): WBC, HGB, HCT, PLT in the last 72 hours. No results for input(s): NA, K, CL, GLUCOSE, BUN, CREATININE, CALCIUM in the last 72 hours.  Invalid input(s): CO CBG (last 3)   Recent Labs  08/26/15 1637 08/26/15 2120 08/27/15 0700  GLUCAP 179* 124* 133*    Wt Readings from Last 3 Encounters:  08/23/15 86.456 kg (190 lb 9.6 oz)  08/20/15 90.719 kg (200 lb)  07/13/15 90.719 kg (200 lb)    Physical Exam:  GEN: no distress. Vital signs reviewed.  HENT: oral mucosa moist Head: Normocephalic and atraumatic.  Eyes: Conjunctivae and EOM are normal.   Cardiovascular: Normal rate and regular rhythm.  No murmur heard. Respiratory: Effort normal and breath sounds normal. No respiratory distress. She has no wheezes.  GI: Soft. Bowel sounds are normal. She exhibits no distension. There is no tenderness.  Musculoskeletal: She exhibits no edema or tenderness.  Neurological: She is alert and oriented.  Speech clear.  Follows basic commands without difficulty.  Moves BUE 5/5 proximal to distal RLE hip flexion 2-/5, knee extention 4/5, dorsiflexion 3+/5, plantarflexion 4/5  LLE hip flexion 2/5, knee extention 4/5, dorsiflexion 3+/5, plantarflexion 4/5  Extensor tone LLE.  Skin: Skin is warm and dry. She is not diaphoretic. There is erythema.  Psychiatric: She has a normal mood and affect. She is a little anxious  Assessment/Plan: 1. Functional deficits secondary to MS exacerbation with weakness,sensory loss, balance deficits which require 3+  hours per day of interdisciplinary therapy in a comprehensive inpatient rehab setting. Physiatrist is providing close team supervision and 24 hour management of active medical problems listed below. Physiatrist and rehab team continue to assess barriers to discharge/monitor patient progress toward functional and medical goals.  Function:  Bathing Bathing position   Position: Shower  Bathing parts Body parts bathed by patient: Right arm, Left arm, Chest, Abdomen, Front perineal area, Buttocks, Right upper leg, Left upper leg, Right lower leg, Left lower leg, Back    Bathing assist Assist Level: Supervision or verbal cues      Upper Body Dressing/Undressing Upper body dressing   What is the patient wearing?: Hospital gown                Upper body assist        Lower Body Dressing/Undressing Lower body dressing   What is the patient wearing?: Pants       Pants- Performed by helper: Pull pants up/down   Non-skid slipper socks- Performed by helper: Don/doff right sock, Don/doff left sock                  Lower body assist Assist for lower body dressing: Supervision or verbal cues      Toileting Toileting Toileting activity did not occur: No continent bowel/bladder event Toileting steps completed by patient: Adjust clothing prior to toileting Toileting steps completed by helper: Performs perineal hygiene, Adjust clothing after toileting Toileting Assistive Devices: Grab bar or rail  Toileting assist Assist level: Touching or steadying assistance (Pt.75%)   Transfers  Chair/bed transfer   Chair/bed transfer method: Squat pivot Chair/bed transfer assist level: Maximal assist (Pt 25 - 49%/lift and lower) Chair/bed transfer assistive device: Armrests     Locomotion Ambulation     Max distance: 48' Assist level: 2 helpers   Wheelchair   Type: Manual Max wheelchair distance: 150 Assist Level: Supervision or verbal cues  Cognition Comprehension Comprehension  assist level: Understands complex 90% of the time/cues 10% of the time  Expression Expression assist level: Expresses complex ideas: With extra time/assistive device  Social Interaction Social Interaction assist level: Interacts appropriately with others with medication or extra time (anti-anxiety, antidepressant).  Problem Solving Problem solving assist level: Solves basic 90% of the time/requires cueing < 10% of the time  Memory Memory assist level: Complete Independence: No helper   Medical Problem List and Plan: 1. Functional deficits secondary to MS exacerbation with RLE weakness, balance deficits and poor safety awareness.  -continue CIR therapies 2. DVT Prophylaxis/Anticoagulation: Pharmaceutical: Lovenox 3. Pain Management: On neurontin tid for neuropathy. Continue to use oxycodone prn for pain.--controlled at present  4. Mood: Team to provide ego support and education. LCSW to follow for evaluation and support.  5. Neuropsych: This patient is capable of making decisions on her own behalf. 6. Skin/Wound Care: Routine pressure relief measures 7. Fluids/Electrolytes/Nutrition: Monitor I/O. Encouraged patient to push po fluids. Offer supplements prn poor intake 8. Autoimmune hepatitis: On imuran daily. Continues to have intermittent nausea with poor po intake.  9. Anxiety disorder: Was started on Lexapro a week PTA.   - xanax TID  - Cont to monitor 10. HTN: monitor BP bid. Continue Cozaar daily.  11. Dehydration: Encouraging PO fluids. BUN slightly improved/holding---recheck over weekend. .   12. DM type 2: Monitor BS ac/hs. Resumed metformin.   May need further increase of metformin--fair control yesterday however---continue to monitor 13. Chronic UTIs: currently On trimpex for prophylaxis.  -ua suspicious, low grade temp---change to bactrim ds  -checking pvr's to establish degree of bladder emptying  14. Constipation: sorbitol again today followed by SSE if needed  LOS  (Days) 4 A FACE TO FACE EVALUATION WAS PERFORMED  Armond Cuthrell T 08/27/2015 7:46 AM

## 2015-08-27 NOTE — Plan of Care (Signed)
Problem: RH BOWEL ELIMINATION Goal: RH STG MANAGE BOWEL WITH ASSISTANCE STG Manage Bowel with Min Assistance.  Outcome: Not Progressing Total assist with incontinence today after laxatives

## 2015-08-27 NOTE — Progress Notes (Signed)
Patient has had several bowel movements after sorbitol and scheduled laxatives given this morning. Will hold on soap suds at this time.

## 2015-08-27 NOTE — Progress Notes (Signed)
Occupational Therapy Session Note  Patient Details  Name: Gabriela Rice MRN: [id number] Date of Birth: 20-Dec-1955  Today's Date: 08/27/2015 OT Individual Time: 0701-0801 OT Individual Time Calculation (min): 60 min    Short Term Goals: Week 1:  OT Short Term Goal 1 (Week 1): Pt will complete toilet transfer with supervision OT Short Term Goal 2 (Week 1): Pt will dress upper body with min assist OT Short Term Goal 3 (Week 1): Pt will dress lower body with mod assist OT Short Term Goal 4 (Week 1): Pt will maintain supported standing balance with steadying assist during assisted lower body dressing OT Short Term Goal 5 (Week 1): Pt will complete HEP to improve grip/pinch strength with supervision  Skilled Therapeutic Interventions/Progress Updates:  Upon entering the room, pt supine in bed with no c/o pain this session. Skilled OT intervention with focus on self care retraining, functional transfers, energy conservation education, sit <>stand, and education on use of AE to increase I with self care. Pt performed supine >sit with max A to EOB. Pt engaged in stand pivot transfer with use of rollator into wheelchair with steady assist only. OT propelled pt into bathroom for stand pivot transfer onto TTB with min A for safety and use of grab bars. Pt engaged in bathing while seated on bench with use of long handled sponge in order to increase I with LB bathing. Pt standing in shower with min A for balance to wash buttocks and peri area. Pt engaged in sit <>stand dressing from wheelchair at sink side. Pt utilizing reacher in order to increase I with min verbal cues for proper technique. Energy conservation education initiated throughout session as pt showing signs of fatigue. Rest breaks throughout session as needed. Pt seated in recliner chair at end of session for breakfast with call bell and all needed items within reach upon exiting the room.   Therapy Documentation Precautions:   Precautions Precautions: Fall Precaution Comments: R ankle may invert; B leg weakness Restrictions Weight Bearing Restrictions: No Vital Signs: Therapy Vitals Temp: 98.6 F (37 C) Temp Source: Oral Pulse Rate: 86 Resp: 18 BP: 110/60 mmHg Patient Position (if appropriate): Lying Oxygen Therapy SpO2: 95 % O2 Device: Not Delivered Pain: Pain Assessment Pain Assessment: No/denies pain  See Function Navigator for Current Functional Status.   Therapy/Group: Individual Therapy  Phineas Semen 08/27/2015, 7:52 AM

## 2015-08-28 ENCOUNTER — Inpatient Hospital Stay (HOSPITAL_COMMUNITY): Payer: BLUE CROSS/BLUE SHIELD | Admitting: Physical Therapy

## 2015-08-28 ENCOUNTER — Other Ambulatory Visit: Payer: BLUE CROSS/BLUE SHIELD

## 2015-08-28 ENCOUNTER — Inpatient Hospital Stay (HOSPITAL_COMMUNITY): Payer: BLUE CROSS/BLUE SHIELD | Admitting: Occupational Therapy

## 2015-08-28 LAB — GLUCOSE, CAPILLARY
GLUCOSE-CAPILLARY: 116 mg/dL — AB (ref 65–99)
GLUCOSE-CAPILLARY: 194 mg/dL — AB (ref 65–99)
GLUCOSE-CAPILLARY: 116 mg/dL — AB (ref 65–99)
GLUCOSE-CAPILLARY: 133 mg/dL — AB (ref 65–99)

## 2015-08-28 NOTE — Progress Notes (Signed)
Occupational Therapy Session Note  Patient Details  Name: MAKARA MICHELA MRN: [id number] Date of Birth: 1956-08-07  Today's Date: 08/28/2015 OT Individual Time:  -   0900-0945  (11 min)      Short Term Goals: Week 1:  OT Short Term Goal 1 (Week 1): Pt will complete toilet transfer with supervision OT Short Term Goal 2 (Week 1): Pt will dress upper body with min assist OT Short Term Goal 3 (Week 1): Pt will dress lower body with mod assist OT Short Term Goal 4 (Week 1): Pt will maintain supported standing balance with steadying assist during assisted lower body dressing OT Short Term Goal 5 (Week 1): Pt will complete HEP to improve grip/pinch strength with supervision Week 2:     Skilled Therapeutic Interventions/Progress Updates:    Upon entering the room, pt sitting on toilet.  Had small BM and no urine.  Pain in back 3/10.  . Skilled OT intervention with focus on self care retraining, functional transfers, energy conservation education, sit <>stand, and education on use of body mechanics  to increase I with self care.  Transferred with squat pivot to wc.  Did 3 sit tostands at bar for clothing management.  Educated pt on wc and how to turn.  Propelled to gym.  Engaged in UE activity using 1 # with min assist for support when moving elbow and shoulder.  Pt. Propelled back towards the room about 75 fett before OT took over.  Transferred to n recliner chair at end of session t with call bell and all needed items within reach upon exiting the room  Therapy Documentation Precautions:  Precautions Precautions: Fall Precaution Comments: R ankle may invert; B leg weakness Restrictions Weight Bearing Restrictions: No General:      Pain:  3/10            See Function Navigator for Current Functional Status.   Therapy/Group: Individual Therapy  Lisa Roca 08/28/2015, 7:58 AM

## 2015-08-28 NOTE — Progress Notes (Signed)
[facility] PHYSICAL MEDICINE & REHABILITATION     PROGRESS NOTE    Subjective/Complaints: Pt seen with PT this AM.  Pt states she had a bad day yesterday due to all the medications for her bowels.  She was not able to actively participate in therapies, but is doing much better today.     ROS: Denies CP, SOB, n/v/d.   Objective: Vital Signs: Blood pressure 108/64, pulse 74, temperature 98.3 F (36.8 C), temperature source Oral, resp. rate 18, height 5\' 4"  (1.626 m), weight 86.456 kg (190 lb 9.6 oz), SpO2 98 %. No results found. No results for input(s): WBC, HGB, HCT, PLT in the last 72 hours. No results for input(s): NA, K, CL, GLUCOSE, BUN, CREATININE, CALCIUM in the last 72 hours.  Invalid input(s): CO CBG (last 3)   Recent Labs  08/27/15 1626 08/27/15 2113 08/28/15 0643  GLUCAP 186* 131* 116*    Wt Readings from Last 3 Encounters:  08/23/15 86.456 kg (190 lb 9.6 oz)  08/20/15 90.719 kg (200 lb)  07/13/15 90.719 kg (200 lb)    Physical Exam:  GEN: no distress. Vital signs reviewed.  HENT: oral mucosa moist Head: Normocephalic and atraumatic.  Eyes: Conjunctivae and EOM are normal.   Cardiovascular: Normal rate and regular rhythm.  No murmur heard. Respiratory: Effort normal and breath sounds normal. No respiratory distress. She has no wheezes.  GI: Soft. Bowel sounds are normal. She exhibits no distension. There is no tenderness.  Musculoskeletal: She exhibits no edema or tenderness.  Neurological: She is alert and oriented.  Speech clear.  Follows basic commands without difficulty.  Moves BUE 5/5 proximal to distal RLE hip flexion 2-/5, knee extention 4/5, dorsiflexion 3+/5, plantarflexion 4/5  LLE hip flexion 2/5, knee extention 4/5, dorsiflexion 3+/5, plantarflexion 4/5  Extensor tone LLE.  Skin: Skin is warm and dry. She is not diaphoretic. There is erythema.  Psychiatric: She has a normal mood and affect. She is a little  anxious  Assessment/Plan: 1. Functional deficits secondary to MS exacerbation with weakness,sensory loss, balance deficits which require 3+ hours per day of interdisciplinary therapy in a comprehensive inpatient rehab setting. Physiatrist is providing close team supervision and 24 hour management of active medical problems listed below. Physiatrist and rehab team continue to assess barriers to discharge/monitor patient progress toward functional and medical goals.  Function:  Bathing Bathing position   Position: Shower  Bathing parts Body parts bathed by patient: Right arm, Left arm, Chest, Abdomen, Front perineal area, Buttocks, Right upper leg, Left upper leg, Right lower leg, Left lower leg, Back    Bathing assist Assist Level: Touching or steadying assistance(Pt > 75%)      Upper Body Dressing/Undressing Upper body dressing   What is the patient wearing?: Pull over shirt/dress     Pull over shirt/dress - Perfomed by patient: Thread/unthread right sleeve, Thread/unthread left sleeve, Put head through opening, Pull shirt over trunk          Upper body assist Assist Level: Set up      Lower Body Dressing/Undressing Lower body dressing   What is the patient wearing?: Pants     Pants- Performed by patient: Pull pants up/down, Thread/unthread left pants leg Pants- Performed by helper: Thread/unthread right pants leg   Non-skid slipper socks- Performed by helper: Don/doff right sock, Don/doff left sock                  Lower body assist Assist for lower body dressing:  (  mod)      Toileting Toileting Toileting activity did not occur: No continent bowel/bladder event Toileting steps completed by patient: Adjust clothing prior to toileting Toileting steps completed by helper: Performs perineal hygiene, Adjust clothing after toileting Toileting Assistive Devices: Grab bar or rail  Toileting assist Assist level: Touching or steadying assistance (Pt.75%)    Transfers Chair/bed transfer   Chair/bed transfer method: Stand pivot Chair/bed transfer assist level: Touching or steadying assistance (Pt > 75%) Chair/bed transfer assistive device: Armrests     Locomotion Ambulation     Max distance: 23' Assist level: 2 helpers   Wheelchair   Type: Manual Max wheelchair distance: 150 Assist Level: Supervision or verbal cues  Cognition Comprehension Comprehension assist level: Understands complex 90% of the time/cues 10% of the time  Expression Expression assist level: Expresses complex ideas: With extra time/assistive device  Social Interaction Social Interaction assist level: Interacts appropriately with others with medication or extra time (anti-anxiety, antidepressant).  Problem Solving Problem solving assist level: Solves basic 90% of the time/requires cueing < 10% of the time  Memory Memory assist level: Complete Independence: No helper   Medical Problem List and Plan: 1. Functional deficits secondary to MS exacerbation with RLE weakness, balance deficits and poor safety awareness.  -continue CIR 2. DVT Prophylaxis/Anticoagulation: Pharmaceutical: Lovenox 3. Pain Management: On neurontin tid for neuropathy. Continue to use oxycodone prn for pain.--controlled at present  4. Mood: Team to provide ego support and education. LCSW to follow for evaluation and support.  5. Neuropsych: This patient is capable of making decisions on her own behalf. 6. Skin/Wound Care: Routine pressure relief measures 7. Fluids/Electrolytes/Nutrition: Monitor I/O. Encouraged patient to push po fluids. Offer supplements prn poor intake 8. Autoimmune hepatitis: On imuran daily. Continues to have intermittent nausea with poor po intake.  9. Anxiety disorder: Was started on Lexapro a week PTA.   - xanax 0.5 BID and 1mg  qHS  - Cont to monitor 10. HTN: monitor BP bid. Continue Cozaar daily.  11. Dehydration: Encouraging PO fluids.   -Improving   12. DM  type 2: Monitor BS ac/hs. Resumed metformin.     -Relatively well controlled  -Will cont to monitor 13. Chronic UTIs: currently On trimpex for prophylaxis.  -Pt started on bactrim ds for suspected UTI, Ucx contaminent, reordered Ucx  - PVRs ~ 100 14. Constipation: Pt had BM on 11/27  LOS (Days) 5 A FACE TO FACE EVALUATION WAS PERFORMED  Fabiola Mudgett Lorie Phenix 08/28/2015 8:27 AM

## 2015-08-28 NOTE — Progress Notes (Signed)
Physical Therapy Session Note  Patient Details  Name: Gabriela Rice MRN: [id number] Date of Birth: 07/24/1956  Today's Date: 08/28/2015 PT Individual Time: 1410-1511 PT Individual Time Calculation (min): 61 min   Short Term Goals: Week 1:  PT Short Term Goal 1 (Week 1): Pt will demonstrates R side lie to sit transfer req mod A.  PT Short Term Goal 2 (Week 1): Pt will transfer consistently with min A.  PT Short Term Goal 3 (Week 1): Pt will ambulate x 60' with RW or 4WW req min A.  PT Short Term Goal 4 (Week 1): Pt will ascend/descend 2 stairs with B rails req min A.  PT Short Term Goal 5 (Week 1): Pt will transfer in/out of car with min A.   Skilled Therapeutic Interventions/Progress Updates:    Pt received resting in bed and agreeable to therapy session.  Session focus on progression of independence with functional mobility, static standing tolerance, and LE strengthening.  PT administered BERG with pt scoring 38/56 using rollator; PT provided education on meaning of results and increased fall risk.  PT instructed patient in standing balance activity reaching across midline to R and L for horseshoes and placing on basketball rim.  PT instructed patient in standing balance and strengthening activity for hip abductors, R foot/L foot on 2" step while patient moved horseshoes from rollator to basketball rim with seated rest break in between R and L side.  PT instructed patient in Nustep x5 min with multiple rest breaks at lowest resistance setting for endurance and LE NMR.  Pt returned to room in w/c with supervision and positioned supine in bed with mod assist for LEs and +2 assist to scoot to HOB using draw sheet.  Pt left supine with call bell in reach and needs met.   Therapy Documentation Precautions:  Precautions Precautions: Fall Precaution Comments: R ankle may invert; B leg weakness Restrictions Weight Bearing Restrictions: No Pain:   no c/o pain Balance: Balance Balance  Assessed: Yes Standardized Balance Assessment Standardized Balance Assessment: Berg Balance Test (using rollator ) Berg Balance Test Sit to Stand: Able to stand  independently using hands Standing Unsupported: Able to stand safely 2 minutes Sitting with Back Unsupported but Feet Supported on Floor or Stool: Able to sit safely and securely 2 minutes Stand to Sit: Uses backs of legs against chair to control descent Transfers: Able to transfer with verbal cueing and /or supervision Standing Unsupported with Eyes Closed: Able to stand 10 seconds safely Standing Ubsupported with Feet Together: Able to place feet together independently and stand 1 minute safely (toes apart) From Standing, Reach Forward with Outstretched Arm: Can reach confidently >25 cm (10") From Standing Position, Pick up Object from Floor: Able to pick up shoe safely and easily From Standing Position, Turn to Look Behind Over each Shoulder: Looks behind one side only/other side shows less weight shift Turn 360 Degrees: Needs assistance while turning Standing Unsupported, Alternately Place Feet on Step/Stool: Able to complete 4 steps without aid or supervision Standing Unsupported, One Foot in Front: Able to take small step independently and hold 30 seconds Standing on One Leg: Unable to try or needs assist to prevent fall Total Score: 38   See Function Navigator for Current Functional Status.   Therapy/Group: Individual Therapy   E Penven-Crew 08/28/2015, 2:41 PM  

## 2015-08-28 NOTE — Progress Notes (Signed)
Recreational Therapy Assessment and Plan  Patient Details  Name: Gabriela Rice MRN: [id number] Date of Birth: 1956/03/11 Today's Date: 08/28/2015  Rehab Potential: Good ELOS: 2 weeks   Assessment Clinical Impression: Problem List:  Patient Active Problem List   Diagnosis Date Noted  . Weakness of both legs   . Essential hypertension   . Type 2 diabetes mellitus with diabetic neuropathy ([place])   . Morbid obesity ([place])   . Generalized anxiety disorder   . Lumbar spondylosis with myelopathy   . Dehydration   . Chronic UTI   . Multiple sclerosis exacerbation ([place]) 08/20/2015  . Abdominal pain 08/20/2015  . Autoimmune hepatitis ([place]) 08/20/2015  . Diabetes mellitus type 2, controlled ([place]) 08/20/2015  . Hypertension 08/20/2015  . Right-sided Lumbar Facet Syndrome 07/13/2015  . Lumbar spondylosis 07/13/2015  . Chronic low back pain 07/13/2015  . Trigger point with back pain 07/13/2015  . Myofascial pain syndrome 07/13/2015  . Chronic pain 07/13/2015  . Chronic pain syndrome 07/13/2015  . Generalized weakness 07/13/2015  . Right-sided chronic neck pain 07/13/2015  . Cervical spondylosis 07/13/2015  . DDD (degenerative disc disease), cervical 07/13/2015  . Cervical facet syndrome 07/13/2015  . DDD (degenerative disc disease), lumbar 07/13/2015  . Pain of right lower extremity 07/13/2015  . Chronic radicular lumbar pain 07/13/2015  . Non-insulin dependent type 2 diabetes mellitus ([place]) 07/13/2015  . History of hysterectomy 07/13/2015  . History of C-section 07/13/2015  . DM (diabetes mellitus) ([place]) 07/13/2015  . Hyperlipidemia 07/13/2015  . Obesity 07/13/2015  . Diverticulosis 07/13/2015  . Lumbago 02/02/2014  . Difficulty in walking(719.7) 02/02/2014  . HBP (high blood pressure) 07/11/2013  . Chronic rhinitis 04/08/2013  . Abnormality of gait  02/16/2013  . Elevated liver function tests 02/12/2013  . Abdominal pain, right upper quadrant 02/12/2013  . Nausea alone 02/12/2013  . Dyspnea 02/04/2013  . Hypoxemia, nocturnal only  02/04/2013  . Encounter for long-term (current) use of other medications 10/14/2012  . Multiple sclerosis ([place]) 10/14/2012  . IRRITABLE BOWEL SYNDROME 03/29/2009  . FATTY LIVER DISEASE 03/29/2009  . Nonspecific elevation of levels of transaminase or lactic acid dehydrogenase (LDH) 03/29/2009  . PERSONAL HX COLONIC POLYPS 03/29/2009    Past Medical History:  Past Medical History  Diagnosis Date  . DM (diabetes mellitus) ([place])   . Hyperlipidemia   . Multiple sclerosis ([place])   . Allergic rhinitis   . Elevated LFTs   . Obesity   . Vitamin D deficiency   . Abnormality of gait 02/16/2013  . Colon polyps   . Diverticulosis   . Hemorrhoids   . Hepatitis     with cholestasis  . Hemorrhoids   . Autoimmune hepatitis ([place])   . Allergy   . Hypertension   . Autoimmune hepatitis ([place])   . Anxiety   . Migraines    Past Surgical History:  Past Surgical History  Procedure Laterality Date  . Myoectomy    . Cesarean section      x 2  . Tonsillectomy and adenoidectomy    . Abdominal hysterectomy      Assessment & Plan Clinical Impression: Gabriela Rice is a 59 y.o. female with history of DM type 2, autoimmune hepatitis, morbid obesity, anxiety disorder, lumbar spondylosis, MS who was admitted on 11/20 with three day history of increase in BLE weakness, frequent falls and progressive gait problems as well as worsening of abdominal pain. She was started on IV solumedrol for MS exacerbation and CT abdomen/pelvis was  negative for diverticulitis or acute abnormalities. Patient declined MRI but was agreeable for CT lumbar spine which revealed left paracentral and foraminal disc extrusion  L2/3 likely below L2 nerve root and no spinal stenosis. She completes IV steroids today with recommendations to start prednisone taper tomorrow. Therapy evaluations done and patient continues to be limited by RLE weakness, balance deficits and requires cues for safety. Patient transferred to CIR on 08/23/2015.  Marland Kitchen    Pt presents with decreased activity tolerance, decreased functional mobility, decreased balance Limiting pt's independence with leisure/community pursuits.  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) Other Leisure Interests: Television;Movies;Computer Leisure Participation Style: Alone;With Family/Friends Awareness of Community Resources: Good-identify 3 post discharge leisure resources Psychosocial / Spiritual Patient agreeable to Pet Therapy: Yes Does patient have pets?: Yes (chiweenie-Izzy) Social interaction - Mood/Behavior: Cooperative Academic librarian Appropriate for Education?: Yes Recreational Therapy Orientation Orientation -Reviewed with patient: Available activity resources Strengths/Weaknesses Patient Strengths/Abilities: Willingness to participate;Active premorbidly Patient weaknesses: Physical limitations TR Patient demonstrates impairments in the following area(s): Endurance;Motor;Pain  Plan Rec Therapy Plan Is patient appropriate for Therapeutic Recreation?: Yes Rehab Potential: Good TR Treatment/Interventions: Adaptive equipment instruction;1:1 session;Balance/vestibular training;Functional mobility training;Community reintegration;Leisure education;Recreation/leisure participation;Therapeutic activities;Patient/family education  Recommendations for other services: None  Discharge Criteria: Patient will be discharged from TR if patient refuses treatment 3 consecutive times without medical reason.  If treatment goals not met, if  there is a change in medical status, if patient makes no progress towards goals or if patient is discharged from hospital.  The above assessment, treatment plan, treatment alternatives and goals were discussed and mutually agreed upon: by patient  Mason 08/28/2015, 11:19 AM

## 2015-08-28 NOTE — Progress Notes (Signed)
Physical Therapy Note  Patient Details  Name: Gabriela Rice MRN: [id number] Date of Birth: 07-Dec-1955 Today's Date: 08/28/2015   Time: 1030-1058 28 minutes  1:1 No c/o pain.  Transfers with min A this session due to fatigue, difficulty advancing R LE.  Nu step with max A for lifting LEs into footplates.  Level 1 4 min, 1 min, 1 min with UEs and LEs (pt unable to propel with just LEs due to fatigue).  Seated adductor ball squeeze with tactile facilitation. Pt with decreased mm endurance, only able to squeeze x 3 before fatigue.   Johnsie Moscoso 08/28/2015, 10:54 AM

## 2015-08-28 NOTE — Progress Notes (Signed)
Physical Therapy Note  Patient Details  Name: Gabriela Rice MRN: [id number] Date of Birth: [birth year] Today's Date: 08/28/2015    Time: [id number] minutes  1:1 No c/o pain, pt states she feels much better than yesterday. Pt performed dressing at bed level with supervision for rolling, max A for pants up, set up for upper body dressing.  Sidelying to sit with supervision with bed rail, increased time. Pt able to maintain sitting balance with 1 UE support.  Stand pivot transfers multiple attempts with supervision, cues for sequencing safely.  Gait with 4WW 50', 30' x 2 with R LE dragging when fatigued.  Supervision for gait, min A for turning to sit in 4WW.  W/c mobility for UE strength and endurance 2 x 150'.  Seated and standing tap ups with facilitation for B LE strengthening. Seated kinetron for B LE strengthening 1 min, then 30 sec x 4, time limited due to fatigue.   DONAWERTH,KAREN 08/28/2015, 8:25 AM

## 2015-08-29 ENCOUNTER — Inpatient Hospital Stay (HOSPITAL_COMMUNITY): Payer: BLUE CROSS/BLUE SHIELD | Admitting: Physical Therapy

## 2015-08-29 ENCOUNTER — Inpatient Hospital Stay (HOSPITAL_COMMUNITY): Payer: BLUE CROSS/BLUE SHIELD

## 2015-08-29 ENCOUNTER — Inpatient Hospital Stay (HOSPITAL_COMMUNITY): Payer: BLUE CROSS/BLUE SHIELD | Admitting: *Deleted

## 2015-08-29 DIAGNOSIS — E1140 Type 2 diabetes mellitus with diabetic neuropathy, unspecified: Secondary | ICD-10-CM

## 2015-08-29 DIAGNOSIS — E860 Dehydration: Secondary | ICD-10-CM

## 2015-08-29 DIAGNOSIS — R29898 Other symptoms and signs involving the musculoskeletal system: Secondary | ICD-10-CM

## 2015-08-29 DIAGNOSIS — F411 Generalized anxiety disorder: Secondary | ICD-10-CM

## 2015-08-29 DIAGNOSIS — R630 Anorexia: Secondary | ICD-10-CM

## 2015-08-29 DIAGNOSIS — G35 Multiple sclerosis: Principal | ICD-10-CM

## 2015-08-29 DIAGNOSIS — I10 Essential (primary) hypertension: Secondary | ICD-10-CM

## 2015-08-29 DIAGNOSIS — N390 Urinary tract infection, site not specified: Secondary | ICD-10-CM

## 2015-08-29 DIAGNOSIS — M4716 Other spondylosis with myelopathy, lumbar region: Secondary | ICD-10-CM

## 2015-08-29 LAB — GLUCOSE, CAPILLARY
GLUCOSE-CAPILLARY: 95 mg/dL (ref 65–99)
GLUCOSE-CAPILLARY: 104 mg/dL — AB (ref 65–99)
Glucose-Capillary: 116 mg/dL — ABNORMAL HIGH (ref 65–99)
Glucose-Capillary: 153 mg/dL — ABNORMAL HIGH (ref 65–99)

## 2015-08-29 NOTE — Progress Notes (Signed)
Physical Therapy Session Note  Patient Details  Name: Gabriela Rice MRN: [id number] Date of Birth: 11/13/1955  Today's Date: 08/29/2015 PT Individual Time: 0800-0900 PT Individual Time Calculation (min): 60 min   Short Term Goals: Week 1:  PT Short Term Goal 1 (Week 1): Pt will demonstrates R side lie to sit transfer req mod A.  PT Short Term Goal 2 (Week 1): Pt will transfer consistently with min A.  PT Short Term Goal 3 (Week 1): Pt will ambulate x 60' with RW or 4WW req min A.  PT Short Term Goal 4 (Week 1): Pt will ascend/descend 2 stairs with B rails req min A.  PT Short Term Goal 5 (Week 1): Pt will transfer in/out of car with min A.   Skilled Therapeutic Interventions/Progress Updates: bed moblity with mod assist using rails and HOB raised.  Pt dressed sitting EOB for shirt and pants with supervision for dynamic sitting balance.  Gait x 30' x 1, x 20' x 1 limited by RLe fatigue.  Footrests adjusted on w/c to improve pelvic position and distribute pressure over bil thighs.  neuromuscular re-education via manual cues, VCS, demo for trunk shortening/lengthening/rotating in sitting, reaching slightly out of BOS, and reciprocal scooting for pelvic dissociation.  Therapeutic exercise performed with bil LE to increase strength for functional mobility: seated bil hip adduction against resistance x 8, ankle PF L/R x 20 .  Pt propelled w/c using bil UEs x 165'.  Pt left resting in w/c with all needs within reach.    Therapy Documentation Precautions:  Precautions Precautions: Fall Precaution Comments: R ankle may invert; B leg weakness Restrictions Weight Bearing Restrictions: No   Pain: Pain Assessment Pain Assessment: 0-10 Pain Score: 5  Pain Location: Back Pain Orientation: Lower Pain Intervention(s): Repositioned;Emotional support   See Function Navigator for Current Functional Status.   Therapy/Group: Individual Therapy  Kelon Easom 08/29/2015, 4:31 PM

## 2015-08-29 NOTE — Progress Notes (Signed)
[facility] PHYSICAL [facility]ICINE & REHABILITATION     PROGRESS NOTE    Subjective/Complaints: Pt seen with OT this AM.  She states that she didn't sleep that well overnight due to anxiety, but wants to keep her medications the same for now as it has provided her with relief overall.  She would also like to liberalize her diet now that her appetite has improved.     ROS: Denies CP, SOB, n/v/d.   Objective: Vital Signs: Blood pressure 122/67, pulse 86, temperature 98.2 F (36.8 C), temperature source Oral, resp. rate 18, height 5\' 4"  (1.626 m), weight 86.456 kg (190 lb 9.6 oz), SpO2 97 %. No results found. No results for input(s): WBC, HGB, HCT, PLT in the last 72 hours. No results for input(s): NA, K, CL, GLUCOSE, BUN, CREATININE, CALCIUM in the last 72 hours.  Invalid input(s): CO CBG (last 3)   Recent Labs  08/28/15 1700 08/28/15 2049 08/29/15 0651  GLUCAP 116* 133* 116*    Wt Readings from Last 3 Encounters:  08/23/15 86.456 kg (190 lb 9.6 oz)  08/20/15 90.719 kg (200 lb)  07/13/15 90.719 kg (200 lb)    Physical Exam:  GEN: no distress. Vital signs reviewed.  HENT: oral mucosa moist Head: Normocephalic and atraumatic.  Eyes: Conjunctivae and EOM are normal.   Cardiovascular: Normal rate and regular rhythm.  No murmur heard. Respiratory: Effort normal and breath sounds normal. No respiratory distress. She has no wheezes.  GI: Soft. Bowel sounds are normal. She exhibits no distension. There is no tenderness.  Musculoskeletal: She exhibits no edema or tenderness.  Neurological: She is alert and oriented.  Speech clear.  Follows basic commands without difficulty.  Moves BUE 5/5 proximal to distal RLE hip flexion 2-/5, knee extention 4-/5, dorsiflexion 3+/5, plantarflexion 4/5  LLE hip flexion 2/5, knee extention 4-/5, dorsiflexion 3+/5, plantarflexion 4/5  Extensor tone LLE.  Skin: Skin is warm and dry. She is not diaphoretic.   Psychiatric: She has a normal mood  and affect. She is a little anxious  Assessment/Plan: 1. Functional deficits secondary to MS exacerbation with weakness,sensory loss, balance deficits which require 3+ hours per day of interdisciplinary therapy in a comprehensive inpatient rehab setting. Physiatrist is providing close team supervision and 24 hour management of active medical problems listed below. Physiatrist and rehab team continue to assess barriers to discharge/monitor patient progress toward functional and medical goals.  Function:  Bathing Bathing position   Position: Shower  Bathing parts Body parts bathed by patient: Right arm, Left arm, Chest, Abdomen, Front perineal area, Buttocks, Right upper leg, Left upper leg, Right lower leg, Left lower leg, Back    Bathing assist Assist Level: Touching or steadying assistance(Pt > 75%)      Upper Body Dressing/Undressing Upper body dressing   What is the patient wearing?: Pull over shirt/dress     Pull over shirt/dress - Perfomed by patient: Thread/unthread right sleeve, Thread/unthread left sleeve, Put head through opening, Pull shirt over trunk          Upper body assist Assist Level: Set up      Lower Body Dressing/Undressing Lower body dressing   What is the patient wearing?: Pants     Pants- Performed by patient: Pull pants up/down, Thread/unthread left pants leg Pants- Performed by helper: Thread/unthread right pants leg   Non-skid slipper socks- Performed by helper: Don/doff right sock, Don/doff left sock  Lower body assist Assist for lower body dressing:  (mod)      Toileting Toileting Toileting activity did not occur: No continent bowel/bladder event Toileting steps completed by patient: Adjust clothing prior to toileting Toileting steps completed by helper: Adjust clothing prior to toileting, Performs perineal hygiene, Adjust clothing after toileting Toileting Assistive Devices: Grab bar or rail  Toileting assist Assist  level: Touching or steadying assistance (Pt.75%)   Transfers Chair/bed transfer   Chair/bed transfer method: Stand pivot Chair/bed transfer assist level: Touching or steadying assistance (Pt > 75%) Chair/bed transfer assistive device: Armrests, Medical sales representative     Max distance: 50 Assist level: Touching or steadying assistance (Pt > 75%)   Wheelchair   Type: Manual Max wheelchair distance: 150 Assist Level: Supervision or verbal cues  Cognition Comprehension Comprehension assist level: Understands complex 90% of the time/cues 10% of the time  Expression Expression assist level: Expresses complex ideas: With extra time/assistive device  Social Interaction Social Interaction assist level: Interacts appropriately with others with medication or extra time (anti-anxiety, antidepressant).  Problem Solving Problem solving assist level: Solves basic 90% of the time/requires cueing < 10% of the time  Memory Memory assist level: Complete Independence: No helper   Medical Problem List and Plan: 1. Functional deficits secondary to MS exacerbation with RLE weakness, balance deficits and poor safety awareness.  -continue CIR 2. DVT Prophylaxis/Anticoagulation: Pharmaceutical: Lovenox 3. Pain Management: On neurontin tid for neuropathy. Continue to use oxycodone prn for pain.--controlled at present  4. Mood: Team to provide ego support and education. LCSW to follow for evaluation and support.  5. Neuropsych: This patient is capable of making decisions on her own behalf. 6. Skin/Wound Care: Routine pressure relief measures 7. Fluids/Electrolytes/Nutrition: Monitor I/O. Encouraged patient to push po fluids.   -Diet liberalized to diabetic diet  8. Autoimmune hepatitis: On imuran daily. Continues to have intermittent nausea with poor po intake.  9. Anxiety disorder: Was started on Lexapro a week PTA.   - xanax 0.5 BID and 1mg  qHS  - Cont to monitor 10. HTN: monitor BP  bid. Continue Cozaar daily.  11. Dehydration: Encouraging PO fluids.   -Improving   12. DM type 2: Monitor BS ac/hs. Resumed metformin.     -Relatively well controlled  -Will cont to monitor 13. Chronic UTIs: currently On trimpex for prophylaxis.  -Pt started on bactrim ds for suspected UTI, Ucx contaminent, reordered Ucx - pending 14. Constipation: Pt had BM on 11/27  LOS (Days) 6 A FACE TO FACE EVALUATION WAS PERFORMED  Shelia Kingsberry Lorie Phenix 08/29/2015 8:38 AM

## 2015-08-29 NOTE — Progress Notes (Signed)
Occupational Therapy Session Note  Patient Details  Name: Gabriela Rice MRN: [id number] Date of Birth: 05/15/1956  Today's Date: 08/29/2015 OT Individual Time: 0900-1000 OT Individual Time Calculation (min): 60 min    Short Term Goals: Week 1:  OT Short Term Goal 1 (Week 1): Pt will complete toilet transfer with supervision OT Short Term Goal 2 (Week 1): Pt will dress upper body with min assist OT Short Term Goal 3 (Week 1): Pt will dress lower body with mod assist OT Short Term Goal 4 (Week 1): Pt will maintain supported standing balance with steadying assist during assisted lower body dressing OT Short Term Goal 5 (Week 1): Pt will complete HEP to improve grip/pinch strength with supervision  Skilled Therapeutic Interventions/Progress Updates:    Pt resting in w/c with c/o increased pain in back.  Pt stated she did want to shower this morning but wasn't sure how "it would go."  Pt engaged in BADL retraining including bathing at shower level and dressing with sit<>stand from w/c at sink.  Pt required max A for stand pivot transfers using grab bars. Pt completed bathing tasks with steady A using grab bars and long handle sponge.  Pt exhibited increased difficulty with lift BLE during dressing tasks and increased pain in lower back.  Pt required tot A + 2 for LB dressing tasks (pulling up pants) and bed mobility after transfer to bed.  Pt fatigued quickly and by self report did not "do as well" as previous days. Focus on activity tolerance, sit<>stand, standing balance, functional transfers, BADL retraining, and safety awareness.  Therapy Documentation Precautions:  Precautions Precautions: Fall Precaution Comments: R ankle may invert; B leg weakness Restrictions Weight Bearing Restrictions: No Pain: Pain Assessment Pain Assessment: No/denies pain Pain Score:3 Pain Location: Back Pain Orientation: Lower Pain Intervention(s): repositioned   See Function Navigator for Current  Functional Status.   Therapy/Group: Individual Therapy  Leroy Libman 08/29/2015, 10:01 AM

## 2015-08-29 NOTE — Telephone Encounter (Signed)
Left a message for patient to call back. 

## 2015-08-29 NOTE — Progress Notes (Signed)
Physical Therapy Session Note  Patient Details  Name: Gabriela Rice MRN: [id number] Date of Birth: 27-Apr-1956  Today's Date: 08/29/2015 PT Individual Time: 1123-1202 PT Individual Time Calculation (min): 39 min   Short Term Goals: Week 1:  PT Short Term Goal 1 (Week 1): Pt will demonstrates R side lie to sit transfer req mod A.  PT Short Term Goal 2 (Week 1): Pt will transfer consistently with min A.  PT Short Term Goal 3 (Week 1): Pt will ambulate x 60' with RW or 4WW req min A.  PT Short Term Goal 4 (Week 1): Pt will ascend/descend 2 stairs with B rails req min A.  PT Short Term Goal 5 (Week 1): Pt will transfer in/out of car with min A.   Skilled Therapeutic Interventions/Progress Updates:    Pt received resting in bed and agreeable to therapy session.  Pt reporting increased LE weakness today and overall fatigue requiring more assist than previous day.  Pt performed supine>sit with supervision utilizing bed rails and elevated HOB.  Stand>pivot to w/c with steady assist for balance and verbal cues for movement of LLE for safe transfer.  PT instructed patient in community level w/c mobility off unit to atrium with pt propelling with BUEs and 1 rest break on elevator.  PT instructed pt in BLE stretches sitting in w/c 2x30 seconds for heel cord stretch and IR/ER stretch.  Pt returned to room in w/c total assist for energy conservation and stand>pivot back to bed with steady assist and positioned in supine with call bell in reach and needs met.   Therapy Documentation Precautions:  Precautions Precautions: Fall Precaution Comments: R ankle may invert; B leg weakness Restrictions Weight Bearing Restrictions: No Pain: Pain Assessment Pain Assessment: 0-10 Pain Score: 5  Pain Location: Back Pain Orientation: Lower Pain Intervention(s): Repositioned;Emotional support   See Function Navigator for Current Functional Status.   Therapy/Group: Individual Therapy  Earnest Conroy  Penven-Crew 08/29/2015, 12:55 PM

## 2015-08-29 NOTE — Progress Notes (Signed)
Physical Therapy Session Note  Patient Details  Name: Gabriela Rice MRN: [id number] Date of Birth: April 19, 1956  Today's Date: 08/29/2015 PT Individual Time: 1620-1700 PT Individual Time Calculation (min): 40 min   Short Term Goals: Week 1:  PT Short Term Goal 1 (Week 1): Pt will demonstrates R side lie to sit transfer req mod A.  PT Short Term Goal 2 (Week 1): Pt will transfer consistently with min A.  PT Short Term Goal 3 (Week 1): Pt will ambulate x 60' with RW or 4WW req min A.  PT Short Term Goal 4 (Week 1): Pt will ascend/descend 2 stairs with B rails req min A.  PT Short Term Goal 5 (Week 1): Pt will transfer in/out of car with min A.    Skilled Therapeutic Interventions/Progress Updates:   Patient received in bed reporting increased R > L LE fatigue this date. Patient required assist to bend LLE to roll to R using bed rails and mod A to elevate trunk. Performed stand pivot transfer to wheelchair with mod A. Patient propelled wheelchair using BUE x 170 ft + 200 ft with mod I. Performed squat pivot transfer to and from level mat with mod improved to min A. Practiced sit <> stand transfers from slightly raised mat > mat in lowest position with BUE support on arm chair in front of patient, 3 x 3 trials with min-mod A. Kinetron from wheelchair position for BLE strengthening/NMR and activity tolerance x 6 min total, intervals of 10-15 seconds at a time with 30-60 sec rest breaks between trials. Patient propelled wheelchair back to room, performed stand pivot transfer with mod A, sit > supine with mod A for BLE management. Patient left semi reclined in bed with NT present.   Therapy Documentation Precautions:  Precautions Precautions: Fall Precaution Comments: R ankle may invert; B leg weakness Restrictions Weight Bearing Restrictions: No Pain: Pain Assessment Pain Assessment: 0-10 Pain Score: 4  Pain Type: Acute pain Pain Location: Back Pain Orientation: Lower Pain Descriptors /  Indicators: Aching;Discomfort Pain Onset: On-going Pain Intervention(s): Repositioned;Rest  See Function Navigator for Current Functional Status.   Therapy/Group: Individual Therapy  Laretta Alstrom 08/29/2015, 5:12 PM

## 2015-08-30 ENCOUNTER — Inpatient Hospital Stay (HOSPITAL_COMMUNITY): Payer: BLUE CROSS/BLUE SHIELD | Admitting: Physical Therapy

## 2015-08-30 ENCOUNTER — Inpatient Hospital Stay (HOSPITAL_COMMUNITY): Payer: BLUE CROSS/BLUE SHIELD

## 2015-08-30 ENCOUNTER — Ambulatory Visit (HOSPITAL_COMMUNITY): Payer: BLUE CROSS/BLUE SHIELD | Admitting: *Deleted

## 2015-08-30 LAB — URINE CULTURE

## 2015-08-30 LAB — GLUCOSE, CAPILLARY
GLUCOSE-CAPILLARY: 107 mg/dL — AB (ref 65–99)
GLUCOSE-CAPILLARY: 112 mg/dL — AB (ref 65–99)
Glucose-Capillary: 116 mg/dL — ABNORMAL HIGH (ref 65–99)

## 2015-08-30 MED ORDER — NITROFURANTOIN MONOHYD MACRO 100 MG PO CAPS
100.0000 mg | ORAL_CAPSULE | Freq: Two times a day (BID) | ORAL | Status: AC
  Administered 2015-08-30 – 2015-09-03 (×10): 100 mg via ORAL
  Filled 2015-08-30 (×14): qty 1

## 2015-08-30 NOTE — Progress Notes (Signed)
Physical Therapy Session Note  Patient Details  Name: Gabriela Rice MRN: [id number] Date of Birth: 1956-01-21  Today's Date: 08/30/2015 PT Individual Time: 1030-1130 Treatment Session 2: 1500-1600 PT Individual Time Calculation (min): 60 min Treatment Session 2: 60 min  Short Term Goals: Week 1:  PT Short Term Goal 1 (Week 1): Pt will demonstrates R side lie to sit transfer req mod A.  PT Short Term Goal 2 (Week 1): Pt will transfer consistently with min A.  PT Short Term Goal 3 (Week 1): Pt will ambulate x 60' with RW or 4WW req min A.  PT Short Term Goal 4 (Week 1): Pt will ascend/descend 2 stairs with B rails req min A.  PT Short Term Goal 5 (Week 1): Pt will transfer in/out of car with min A.   Skilled Therapeutic Interventions/Progress Updates:    Treatment Session 1: Pt received up in w/c with Rec Therapist, reporting exhaustion. PT offers for pt to do w/c seated games on Nintendo Wii, but pt reports back pain too high and requests to lie down on ice packs. Therapeutic Activity - PT instructs pt in stand-pivot transfer w/c to mat with armrests req min A (without RW), mod A for B Legs onto mat during sit to side lie. Pt req min A for leg management to roll on mat and PT places ice pack behind pt's back & neck. Pt req max A for side lie to sit transfer on mat, then CGA squat-pivot transfer mat to w/c. After mat exercises, pt requests to toilet, see function tab for details. Pt uses grab bar to stand-pivot transfer w/c to toilet req min A, PT manages pants due to urgency. After toileting, pt returns to bed in same manner described above with squat pivot transfer. Therapeutic Exercise - PT instructs pt in mat level exercises while lying on cold pack (ice for easing back pain and for reducing pt's body temperature due to heat sensitivity from MS): AAROM ankle pumps R/L x 5/10, AAROM heel slides R/L x 5/10, AAROM supine hip abduction/adduction x 10, bridges with PT stabilizing pt's feet x 10  reps (L hip stronger and with more raise than R hip) x 10, hip IR/ER in B hook lie AAROM x 6, side lie clam shells x 10, lower abdominal/hip flexor activation with B legs on small swiss ball x 10 reps AAROM. W/C Management - PT instructs pt in self propelling manual w/c x 100' req supervision with B UEs - pt then fatigued and reports urinary urgency, so PT dependently pushes pt rest of way to bathroom. Pt ended in bed with all needs in reach. Pt is slowly progressing with PT, but MS-related fatigue are a huge limiting factor in her progress.   Treatment Session 2: Pt received up in recliner, c/o leg weakness, but agreeable to PT. Gait Training - PT obtains RW (as opposed to using pt's 4WW in room) and instructs pt in ambulation x 15' req min A from PT and +2 for w/c follow for safety - pt demonstrates compensated foot drag pattern. Therapeutic Exercise - PT rolls pt up to upper body ergometer and instructs her at L1 with rest breaks as needed, in sit in w/c, x 6 minutes total. PT instructs pt in abdominal strengthening exercise at w/c level: 3 x 5 reps of sitting back up/away from backrest while reaching arms forward towards PT. PT places pt's legs on Kinetron while facing machine in w/c, at 90 cm/sec resistance (low) req AAROM to  complete 2 sets of foot peddling. Therapeutic Activity - Pt req min A sit to stand from recliner with armrests. Pt req min A to squat-pivot transfer w/c to bed at end of session, and mod A for B LEs sit to supine. Pt ended in bed to rest with all needs in reach. Pt had a difficult afternoon - fatigue is great. Pt may benefit from less intensive therapy schedule due to MS-related fatigue and to avoid over-exercising.    Therapy Documentation Precautions:  Precautions Precautions: Fall Precaution Comments: R ankle may invert; B leg weakness Restrictions Weight Bearing Restrictions: No ain: Pain Assessment Pain Assessment: 0-10 Pain Score: 3  Pain Type: Chronic pain Pain  Location: Back Pain Orientation: Lower Pain Descriptors / Indicators: Aching Pain Onset: On-going Pain Intervention(s): Cold applied Multiple Pain Sites: No Treatment Session 2: Pt c/o 3/10 aching low back pain - PT offers ice, but pt refuses for the time being.    See Function Navigator for Current Functional Status.   Therapy/Group: Individual Therapy  Gid Schoffstall M 08/30/2015, 10:42 AM

## 2015-08-30 NOTE — Telephone Encounter (Signed)
Patient's husband states she was admitted to hospital and he will have her call back.

## 2015-08-30 NOTE — Progress Notes (Signed)
Occupational Therapy Session Note  Patient Details  Name: Gabriela Rice MRN: [id number] Date of Birth: 1955-12-27  Today's Date: 08/30/2015 OT Individual Time: 0900-1030 OT Individual Time Calculation (min): 90 min    Short Term Goals: Week 1:  OT Short Term Goal 1 (Week 1): Pt will complete toilet transfer with supervision OT Short Term Goal 2 (Week 1): Pt will dress upper body with min assist OT Short Term Goal 3 (Week 1): Pt will dress lower body with mod assist OT Short Term Goal 4 (Week 1): Pt will maintain supported standing balance with steadying assist during assisted lower body dressing OT Short Term Goal 5 (Week 1): Pt will complete HEP to improve grip/pinch strength with supervision  Skilled Therapeutic Interventions/Progress Updates:    Pt initially engaged in BADL retraining including bathing at shower level and dressing with sit<>stand from w/c at sink.  Pt amb with Rollator to bathroom and transferred to shower bench to complete bathing tasks.Pt exhibited increased difficulty with lifting BLE and required use of w/c to exit bathroom.  Pt required max A for squat pivot transfer to w/c from shower seat.  Pt stated that she is typically tired after showers.  Pt required mod A for sit<>stand at sink and required assistance to pull up pants.  Pt initiated use of reacher and sock aid to assist with LB dressing tasks but required assistance to complete tasks.  Pt propelled to therapy gym and engaged in [place] therex and therapeutic activities with focus on reaching outside of BOS and forward in w/c.  Pt required multiple rest breaks throughout session and stated that continuous activities/tasks continue to be physically fatiguing. Pt propelled to room and stopped in hallway to assist with decorating Christmas chair. Focus on activity tolerance, sit<>stand, standing balance, functional transfers, BADL retraining, w/c mobility, and safety awareness to increased independence with  BADLs.  Therapy Documentation Precautions:  Precautions Precautions: Fall Precaution Comments: R ankle may invert; B leg weakness Restrictions Weight Bearing Restrictions: No   Pain: Pain Assessment Pain Assessment: 0-10 Pain Score: 3  Pain Type: Chronic pain Pain Location: Back Pain Orientation: Lower Pain Descriptors / Indicators: Aching Pain Onset: On-going Pain Intervention(s): repositoined   See Function Navigator for Current Functional Status.   Therapy/Group: Individual Therapy  Leroy Libman 08/30/2015, 10:57 AM

## 2015-08-30 NOTE — Progress Notes (Signed)
Social Work Patient ID: Gabriela Rice, female   DOB: 05/18/1956, 59 y.o.   MRN: [id number] Met with pt to discuss team conference goals-mod/i-supervision level and discharge 12/14. She is pleased with her progress and is doing much better today than yesterday. She wants to be a high level as possible before returning home. She feels her endurance is getting better and is hoping in the next two weeks she will be doing much better.

## 2015-08-30 NOTE — Progress Notes (Signed)
[facility] PHYSICAL MEDICINE & REHABILITATION     PROGRESS NOTE    Subjective/Complaints: No issues overnite, slept well, "bad day in therapy yesterday"   ROS: Denies CP, SOB, n/v/d.   Objective: Vital Signs: Blood pressure 118/72, pulse 95, temperature 98.6 F (37 C), temperature source Oral, resp. rate 16, height $RemoveBe'5\' 4"'rsVqJMXeF$  (1.626 m), weight 86.456 kg (190 lb 9.6 oz), SpO2 96 %. No results found. No results for input(s): WBC, HGB, HCT, PLT in the last 72 hours. No results for input(s): NA, K, CL, GLUCOSE, BUN, CREATININE, CALCIUM in the last 72 hours.  Invalid input(s): CO CBG (last 3)   Recent Labs  08/29/15 1658 08/29/15 2124 08/30/15 0638  GLUCAP 95 104* 107*    Wt Readings from Last 3 Encounters:  08/23/15 86.456 kg (190 lb 9.6 oz)  08/20/15 90.719 kg (200 lb)  07/13/15 90.719 kg (200 lb)    Physical Exam:  GEN: no distress. Vital signs reviewed.  HENT: oral mucosa moist Head: Normocephalic and atraumatic.  Eyes: Conjunctivae and EOM are normal.   Cardiovascular: Normal rate and regular rhythm.  No murmur heard. Respiratory: Effort normal and breath sounds normal. No respiratory distress. She has no wheezes.  GI: Soft. Bowel sounds are normal. She exhibits no distension. There is no tenderness.  Musculoskeletal: She exhibits no edema or tenderness.  Neurological: She is alert and oriented.  Speech clear.  Follows basic commands without difficulty.  Moves BUE 5/5 proximal to distal RLE hip flexion 2-/5, knee extention 4-/5, dorsiflexion 3-/5, plantarflexion 4-/5  LLE hip flexion 2/5, knee extention 4-/5, dorsiflexion 3-/5, plantarflexion 4-/5  Extensor tone LLE.  Skin: Skin is warm and dry. She is not diaphoretic.   Psychiatric: She has a normal mood and affect.   Assessment/Plan: 1. Functional deficits secondary to MS exacerbation with weakness,sensory loss, balance deficits which require 3+ hours per day of interdisciplinary therapy in a comprehensive  inpatient rehab setting. Physiatrist is providing close team supervision and 24 hour management of active medical problems listed below. Physiatrist and rehab team continue to assess barriers to discharge/monitor patient progress toward functional and medical goals.  Function:  Bathing Bathing position   Position: Shower  Bathing parts Body parts bathed by patient: Right arm, Left arm, Chest, Abdomen, Front perineal area, Buttocks, Right upper leg, Left upper leg, Right lower leg, Left lower leg, Back    Bathing assist Assist Level: Touching or steadying assistance(Pt > 75%)      Upper Body Dressing/Undressing Upper body dressing   What is the patient wearing?: Pull over shirt/dress     Pull over shirt/dress - Perfomed by patient: Thread/unthread right sleeve, Thread/unthread left sleeve, Put head through opening, Pull shirt over trunk          Upper body assist Assist Level: Set up   Set up : To obtain clothing/put away  Lower Body Dressing/Undressing Lower body dressing   What is the patient wearing?: Pants, Non-skid slipper socks     Pants- Performed by patient: Pull pants up/down, Thread/unthread left pants leg Pants- Performed by helper: Thread/unthread right pants leg, Thread/unthread left pants leg, Pull pants up/down   Non-skid slipper socks- Performed by helper: Don/doff right sock, Don/doff left sock                  Lower body assist Assist for lower body dressing: 2 Helpers      Toileting Toileting Toileting activity did not occur: No continent bowel/bladder event Toileting steps completed by patient:  Adjust clothing prior to toileting Toileting steps completed by helper: Adjust clothing prior to toileting, Performs perineal hygiene, Adjust clothing after toileting Toileting Assistive Devices: Grab bar or rail  Toileting assist Assist level: Touching or steadying assistance (Pt.75%)   Transfers Chair/bed transfer   Chair/bed transfer method: Stand  pivot, Squat pivot Chair/bed transfer assist level: Moderate assist (Pt 50 - 74%/lift or lower) Chair/bed transfer assistive device: Armrests     Locomotion Ambulation     Max distance: 50 Assist level: Touching or steadying assistance (Pt > 75%)   Wheelchair   Type: Manual Max wheelchair distance: 200 Assist Level: No help, No cues, assistive device, takes more than reasonable amount of time  Cognition Comprehension Comprehension assist level: Understands complex 90% of the time/cues 10% of the time  Expression Expression assist level: Expresses complex ideas: With extra time/assistive device  Social Interaction Social Interaction assist level: Interacts appropriately with others with medication or extra time (anti-anxiety, antidepressant).  Problem Solving Problem solving assist level: Solves complex 90% of the time/cues < 10% of the time  Memory Memory assist level: Complete Independence: No helper   Medical Problem List and Plan: 1. Functional deficits secondary to MS exacerbation with RLE weakness, balance deficits and poor safety awareness.  -Team conference today please see physician documentation under team conference tab, met with team face-to-face to discuss problems,progress, and goals. Formulized individual treatment plan based on medical history, underlying problem and comorbidities. 2. DVT Prophylaxis/Anticoagulation: Pharmaceutical: Lovenox 3. Pain Management: On neurontin tid for neuropathy. Continue to use oxycodone prn for pain.--controlled at present  4. Mood: Team to provide ego support and education. LCSW to follow for evaluation and support.  5. Neuropsych: This patient is capable of making decisions on her own behalf. 6. Skin/Wound Care: Routine pressure relief measures 7. Fluids/Electrolytes/Nutrition: Monitor I/O. Encouraged patient to push po fluids.    8. Autoimmune hepatitis: On imuran daily. Continues to have intermittent nausea with poor po  intake.  9. Anxiety disorder: Was started on Lexapro a week PTA.   - xanax 0.5 BID and $Remov'1mg'NiJhsh$  qHS  - Cont to monitor 10. HTN: monitor BP bid. Continue Cozaar daily.  11. Dehydration: Encouraging PO fluids.   -Improving   12. DM type 2: Monitor BS ac/hs. Resumed metformin.     -CBGs in low 100s  -Will cont to monitor 13. Chronic UTIs: currently On trimpex for prophylaxis.  -Pt started on bactrim ds for suspected UTI, Ucx contaminent, reordered Ucx prelim enterococcus should be S to bactrim 14. Constipation: Pt had BM on 11/27  LOS (Days) 7 A FACE TO FACE EVALUATION WAS PERFORMED  Charlett Blake 08/30/2015 7:46 AM

## 2015-08-30 NOTE — Patient Care Conference (Signed)
Inpatient RehabilitationTeam Conference and Plan of Care Update Date: 08/30/2015   Time: 11:30 AM    Patient Name: Gabriela Rice      Medical Record Number: [id number]  Date of Birth: June 05, 1956 Sex: Female         Room/Bed: 4M06C/4M06C-01 Payor Info: Payor: [place] / Plan: [employer]BS OTHER / Product Type: *No Product type* /    Admitting Diagnosis: MS Exacerbation  Admit Date/Time:  08/23/2015  4:50 PM Admission Comments: No comment available   Primary Diagnosis:  Multiple sclerosis exacerbation ([place]) Principal Problem: Multiple sclerosis exacerbation ([place])  Patient Active Problem List   Diagnosis Date Noted  . Weakness of both legs   . Essential hypertension   . Type 2 diabetes mellitus with diabetic neuropathy ([place])   . Morbid obesity ([place])   . Generalized anxiety disorder   . Lumbar spondylosis with myelopathy   . Dehydration   . Chronic UTI   . Multiple sclerosis exacerbation ([place]) 08/20/2015  . Abdominal pain 08/20/2015  . Autoimmune hepatitis ([place]) 08/20/2015  . Diabetes mellitus type 2, controlled ([place]) 08/20/2015  . Hypertension 08/20/2015  . Right-sided Lumbar Facet Syndrome 07/13/2015  . Lumbar spondylosis 07/13/2015  . Chronic low back pain 07/13/2015  . Trigger point with back pain 07/13/2015  . Myofascial pain syndrome 07/13/2015  . Chronic pain 07/13/2015  . Chronic pain syndrome 07/13/2015  . Generalized weakness 07/13/2015  . Right-sided chronic neck pain 07/13/2015  . Cervical spondylosis 07/13/2015  . DDD (degenerative disc disease), cervical 07/13/2015  . Cervical facet syndrome 07/13/2015  . DDD (degenerative disc disease), lumbar 07/13/2015  . Pain of right lower extremity 07/13/2015  . Chronic radicular lumbar pain 07/13/2015  . Non-insulin dependent type 2 diabetes mellitus ([place]) 07/13/2015  . History of hysterectomy 07/13/2015  . History of C-section 07/13/2015  . DM (diabetes mellitus) ([place]) 07/13/2015  . Hyperlipidemia  07/13/2015  . Obesity 07/13/2015  . Diverticulosis 07/13/2015  . Lumbago 02/02/2014  . Difficulty in walking(719.7) 02/02/2014  . HBP (high blood pressure) 07/11/2013  . Chronic rhinitis 04/08/2013  . Abnormality of gait 02/16/2013  . Elevated liver function tests 02/12/2013  . Abdominal pain, right upper quadrant 02/12/2013  . Nausea alone 02/12/2013  . Dyspnea 02/04/2013  . Hypoxemia, nocturnal only  02/04/2013  . Encounter for long-term (current) use of other medications 10/14/2012  . Multiple sclerosis ([place]) 10/14/2012  . IRRITABLE BOWEL SYNDROME 03/29/2009  . FATTY LIVER DISEASE 03/29/2009  . Nonspecific elevation of levels of transaminase or lactic acid dehydrogenase (LDH) 03/29/2009  . PERSONAL HX COLONIC POLYPS 03/29/2009    Expected Discharge Date: Expected Discharge Date: 09/13/15  Team Members Present: Physician leading conference: Dr. Alysia Penna Social Worker Present: Ovidio Kin, LCSW Nurse Present: Dorien Chihuahua, RN PT Present: Guilford Shi, PT OT Present: Willeen Cass, OT PPS Coordinator present : Daiva Nakayama, RN, CRRN     Current Status/Progress Goal Weekly Team Focus  Medical   constipation, poor endurance, needed max A yesterday  manage fatigue  improve endurance   Bowel/Bladder   Continent of bowel and bladder; on antibiotic for UTI; LBM 11/28  Mod I  Assess and treat for constipation as needed   Swallow/Nutrition/ Hydration     na        ADL's   bathing at shower level-steady A; UB dressing-setup; LB dressing-max A; transfers-mod/max A; fatigues quickly and can quickly transition from min A to max A  mod I overall  activity tolerance, BADLs, IADLs, sit<>stand, standing  balance, safety awareness   Mobility   min A transfers, mod-max bed mobility from flat bed without rail  mod I in sit; supervision gait, min A stairs  B LE strengthening, improving activity tolerance, improving safety with mobility, standing balance, w/c propulsion, progressive  gait   Communication     na        Safety/Cognition/ Behavioral Observations  No unsafe behaviors noted         Pain   No C/o pain  < 4  Assess and treat for pain q shift and prn   Skin   Skin intact  Mod I  Assess skin q shift and prn      *See Care Plan and progress notes for long and short-term goals.  Barriers to Discharge: still needing physcial A, has 2 steps at home    Possible Resolutions to Barriers:  cont rehab    Discharge Planning/Teaching Needs:  Husbnand works third shift alone at night, if needed could have son come over.      Team Discussion:  Goals mod/i-supervision level. UTI being treated, feeling much better today. Fatigue a big issues but improving each day. Back pain managed with medicines. Constipated-long term issue. Will build ramp once home-need to teach husband how to bump up two stairs.  Revisions to Treatment Plan:  None   Continued Need for Acute Rehabilitation Level of Care: The patient requires daily medical management by a physician with specialized training in physical medicine and rehabilitation for the following conditions: Daily direction of a multidisciplinary physical rehabilitation program to ensure safe treatment while eliciting the highest outcome that is of practical value to the patient.: Yes Daily medical management of patient stability for increased activity during participation in an intensive rehabilitation regime.: Yes Daily analysis of laboratory values and/or radiology reports with any subsequent need for medication adjustment of medical intervention for : Neurological problems;Other  Jazia Faraci, [place] Rh[place] 08/30/2015, 1:50 PM

## 2015-08-31 ENCOUNTER — Inpatient Hospital Stay (HOSPITAL_COMMUNITY): Payer: BLUE CROSS/BLUE SHIELD

## 2015-08-31 ENCOUNTER — Inpatient Hospital Stay (HOSPITAL_COMMUNITY): Payer: BLUE CROSS/BLUE SHIELD | Admitting: Physical Therapy

## 2015-08-31 DIAGNOSIS — H8110 Benign paroxysmal vertigo, unspecified ear: Secondary | ICD-10-CM

## 2015-08-31 LAB — GLUCOSE, CAPILLARY
GLUCOSE-CAPILLARY: 134 mg/dL — AB (ref 65–99)
GLUCOSE-CAPILLARY: 108 mg/dL — AB (ref 65–99)
GLUCOSE-CAPILLARY: 133 mg/dL — AB (ref 65–99)
Glucose-Capillary: 105 mg/dL — ABNORMAL HIGH (ref 65–99)

## 2015-08-31 MED ORDER — MECLIZINE HCL 25 MG PO TABS
25.0000 mg | ORAL_TABLET | Freq: Three times a day (TID) | ORAL | Status: DC | PRN
  Administered 2015-08-31 – 2015-09-04 (×5): 25 mg via ORAL
  Filled 2015-08-31 (×5): qty 1

## 2015-08-31 NOTE — Progress Notes (Signed)
Occupational Therapy Session Note  Patient Details  Name: Gabriela Rice MRN: [id number] Date of Birth: 01-11-1956  Today's Date: 08/31/2015 OT Individual Time: [id number] OT Individual Time Calculation (min): 60 min    Short Term Goals: Week 1:  OT Short Term Goal 1 (Week 1): Pt will complete toilet transfer with supervision OT Short Term Goal 2 (Week 1): Pt will dress upper body with min assist OT Short Term Goal 3 (Week 1): Pt will dress lower body with mod assist OT Short Term Goal 4 (Week 1): Pt will maintain supported standing balance with steadying assist during assisted lower body dressing OT Short Term Goal 5 (Week 1): Pt will complete HEP to improve grip/pinch strength with supervision  Skilled Therapeutic Interventions/Progress Updates: ADL-retraining at shower level with focus on improved activity tolerance, transfers, and AE training.  Pt received supine in bed reporting symptoms of vertigo and mild nausea.   With extra time and setup to use recliner vs w/c (d/t back pain from use of w/c) pt completed bed mobility and transfer with overall min assist to manage BLE.   OT demo'd use of leg lifter to assist wit LE management.   Pt self-feeds and reports improved symptoms to progress to toilet and shower.   Pt completes stand pivot transfers with min assist during session and bathes unassisted using LH sponge.   Pt requires rest break after bathing and returns to recliner to dress at sink with setup assist to provide clothing and mod assist to dress lower body.  OT re-educated pt on use of reacher however no time remained to practice use of devices and pt required assist to don briefs, pants and socks.   Per OT/PT discussion, pt requires frequent rest breaks to recover after mild exertion and is recommended for modification to treatment time/sessions to 15/7.     Therapy Documentation Precautions:  Precautions Precautions: Fall Precaution Comments: R ankle may invert; B leg  weakness Restrictions Weight Bearing Restrictions: No  Vital Signs: Therapy Vitals Temp: 98.4 F (36.9 C) Temp Source: Oral Pulse Rate: 92 Resp: 17 BP: 114/69 mmHg Patient Position (if appropriate): Lying Oxygen Therapy SpO2: 95 % O2 Device: Not Delivered   Pain: Pain Assessment Pain Assessment: No/denies pain  See Function Navigator for Current Functional Status.   Therapy/Group: Individual Therapy  [place] 08/31/2015, 8:11 AM

## 2015-08-31 NOTE — Progress Notes (Signed)
Physical Therapy Note  Patient Details  Name: Gabriela Rice MRN: [id number] Date of Birth: [birth year] Today's Date: 08/31/2015    After conferencing with PA and OT, each is in agreement with PT that pt cannot tolerate the strenuous schedule of 3+ hours therapy per day, and will benefit from [place] being changed to 15/7.    [facility] M 08/31/2015, 2:24 PM

## 2015-08-31 NOTE — Progress Notes (Signed)
[facility] PHYSICAL MEDICINE & REHABILITATION     PROGRESS NOTE    Subjective/Complaints: Busy day yesterday. Long night dealing with bowel movements. Had nausea last night. Woke up with room spinning this morning, originally when she looked side to side, then now only when she looks down. Feels quite tired. Denies nausea, double vision at present   ROS: Denies CP, SOB, n/v/d.   Objective: Vital Signs: Blood pressure 114/69, pulse 92, temperature 98.4 F (36.9 C), temperature source Oral, resp. rate 17, height 5\' 4"  (1.626 m), weight 86.456 kg (190 lb 9.6 oz), SpO2 95 %. No results found. No results for input(s): WBC, HGB, HCT, PLT in the last 72 hours. No results for input(s): NA, K, CL, GLUCOSE, BUN, CREATININE, CALCIUM in the last 72 hours.  Invalid input(s): CO CBG (last 3)   Recent Labs  08/30/15 1159 08/30/15 2047 08/31/15 0633  GLUCAP 116* 112* 134*    Wt Readings from Last 3 Encounters:  08/23/15 86.456 kg (190 lb 9.6 oz)  08/20/15 90.719 kg (200 lb)  07/13/15 90.719 kg (200 lb)    Physical Exam:  GEN: no distress. Vital signs reviewed.  HENT: oral mucosa moist Head: Normocephalic and atraumatic.  Eyes: Conjunctivae and EOM are normal.   Cardiovascular: Normal rate and regular rhythm.  No murmur heard. Respiratory: Effort normal and breath sounds normal. No respiratory distress. She has no wheezes.  GI: Soft. Bowel sounds are normal. She exhibits no distension. There is no tenderness.  Musculoskeletal: She exhibits no edema or tenderness.  Neurological: She is alert and oriented.  Speech clear.  Follows basic commands without difficulty.  Moves BUE 5/5 proximal to distal RLE hip flexion 2-/5, knee extention 4-/5, dorsiflexion 3-/5, plantarflexion 4-/5  LLE hip flexion 2/5, knee extention 4-/5, dorsiflexion 3-/5, plantarflexion 4-/5  Extensor tone LLE.  Skin: Skin is warm and dry. She is not diaphoretic.   Psychiatric: She has a normal mood and  affect.   Assessment/Plan: 1. Functional deficits secondary to MS exacerbation with weakness,sensory loss, balance deficits which require 3+ hours per day of interdisciplinary therapy in a comprehensive inpatient rehab setting. Physiatrist is providing close team supervision and 24 hour management of active medical problems listed below. Physiatrist and rehab team continue to assess barriers to discharge/monitor patient progress toward functional and medical goals.  Function:  Bathing Bathing position   Position: Shower  Bathing parts Body parts bathed by patient: Right arm, Left arm, Chest, Abdomen, Front perineal area, Buttocks, Right upper leg, Left upper leg, Right lower leg, Left lower leg, Back    Bathing assist Assist Level: Touching or steadying assistance(Pt > 75%)      Upper Body Dressing/Undressing Upper body dressing   What is the patient wearing?: Pull over shirt/dress, Bra Bra - Perfomed by patient: Thread/unthread right bra strap, Thread/unthread left bra strap, Hook/unhook bra (pull down sports bra)   Pull over shirt/dress - Perfomed by patient: Thread/unthread right sleeve, Thread/unthread left sleeve, Put head through opening          Upper body assist Assist Level: Set up   Set up : To obtain clothing/put away  Lower Body Dressing/Undressing Lower body dressing   What is the patient wearing?: Pants, Non-skid slipper socks     Pants- Performed by patient: Thread/unthread left pants leg Pants- Performed by helper: Thread/unthread right pants leg, Pull pants up/down Non-skid slipper socks- Performed by patient: Don/doff left sock Non-skid slipper socks- Performed by helper: Don/doff right sock  Lower body assist Assist for lower body dressing: 2 Helpers      Toileting Toileting Toileting activity did not occur: No continent bowel/bladder event Toileting steps completed by patient: Performs perineal hygiene Toileting steps completed  by helper: Adjust clothing prior to toileting Toileting Assistive Devices: Grab bar or rail  Toileting assist Assist level: Touching or steadying assistance (Pt.75%)   Transfers Chair/bed transfer   Chair/bed transfer method: Stand pivot Chair/bed transfer assist level: Touching or steadying assistance (Pt > 75%) Chair/bed transfer assistive device: Armrests, Bedrails     Locomotion Ambulation     Max distance: 15' Assist level: 2 helpers   Wheelchair   Type: Manual Max wheelchair distance: 100' Assist Level: Supervision or verbal cues  Cognition Comprehension Comprehension assist level: Follows complex conversation/direction with extra time/assistive device  Expression Expression assist level: Expresses complex ideas: With no assist  Social Interaction Social Interaction assist level: Interacts appropriately with others - No medications needed.  Problem Solving Problem solving assist level: Solves complex 90% of the time/cues < 10% of the time  Memory Memory assist level: Complete Independence: No helper   Medical Problem List and Plan: 1. Functional deficits secondary to MS exacerbation with RLE weakness, balance deficits and poor safety awareness.  -continue cir therapies 2. DVT Prophylaxis/Anticoagulation: Pharmaceutical: Lovenox 3. Pain Management: On neurontin tid for neuropathy. Continue to use oxycodone prn for pain.--controlled at present  4. Mood: Team to provide ego support and education. LCSW to follow for evaluation and support.  5. Neuropsych: This patient is capable of making decisions on her own behalf. 6. Skin/Wound Care: Routine pressure relief measures 7. Fluids/Electrolytes/Nutrition: Monitor I/O. Encouraged patient to push po fluids.    8. Autoimmune hepatitis: On imuran daily. Continues to have intermittent nausea with poor po intake.  9. Anxiety disorder: Was started on Lexapro a week PTA.   - xanax 0.5 BID and 1mg  qHS  - Cont to monitor 10.  HTN: monitor BP bid. Continue Cozaar daily.  11. Dehydration: Encouraging PO fluids.   -Improving   12. DM type 2: Monitor BS ac/hs. Resumed metformin.     -CBGs in low 100s  -Will cont to monitor 13. Chronic UTIs: currently On trimpex for prophylaxis.  - 100k enterococcus sens to bactrim--started 11/30 14. Constipation: Pt had BM over night 15. ?vertigo: prn meclizine for now. May be related to poor sleep, fatigue. Has had mild episodes in the past , though rare per patient  -consider therapy eval if persistent.   -enouraged increased fluid intake  LOS (Days) 8 A FACE TO FACE EVALUATION WAS PERFORMED  Cordon Gassett T 08/31/2015 8:54 AM

## 2015-08-31 NOTE — Progress Notes (Signed)
Physical Therapy Session Note  Patient Details  Name: Gabriela Rice MRN: [id number] Date of Birth: [birth year]  Today's Date: 08/31/2015 PT Individual Time: 0930-1100 Treatment Session 2: 1345-1430 PT Individual Time Calculation (min): 90 min Treatment Session 2: 45 min  Short Term Goals: Week 1:  PT Short Term Goal 1 (Week 1): Pt will demonstrates R side lie to sit transfer req mod A.  PT Short Term Goal 2 (Week 1): Pt will transfer consistently with min A.  PT Short Term Goal 3 (Week 1): Pt will ambulate x 60' with RW or 4WW req min A.  PT Short Term Goal 4 (Week 1): Pt will ascend/descend 2 stairs with B rails req min A.  PT Short Term Goal 5 (Week 1): Pt will transfer in/out of car with min A.   Skilled Therapeutic Interventions/Progress Updates:    Treatment Session 1: Pt received up in w/c, very upset and tearful re: new symptoms of dizziness that is constant, but exacerbated when looking down. Vitals measured and BP wfl, but HR elevated, likely due to pt's anxiety and being upset about new symptoms. Nurse entered and gave pt medication for this dizziness. W/C Management - PT instructs pt in w/c propulsion x 200' x 2 reps with supervision. PT instructs pt in w/c legrest management - cueing pt to use tactile cues to feel for legrest lever and verbal cues for direction of push to release legrest, so as to avoid looking down. Therapeutic Activity - Pt req CGA to stand-step transfer with RW w/c to mat. PT cues pt to scoot posterior as far as possible, then pt req mod A for B legs sit to supine. PT hands pt a leg lifter and instructs her in using it to scoot legs towards EOM in supine, then do a partial roll (all req min A), then use UEs to push up to sit req min-mod A. Pt completes stand-step transfer mat to w/c with SBA. Pt requests to toilet upon returning to room - see function tab for details. Pt req min A to push up small ramp in w/c into bathroom, then steadying assist to transfer onto [facility]  over toilet. Pt manages pants and hygiene. PT instructs pt in use of leg lifter to get legs onto bed req min A for each leg (mod A overall), then in use of leg lifter to scoot legs over in bed. Therapeutic Exercise: PT instructs pt in B LE ROM and strengthening exercises: AAROM ankle DF with eversion to neutral x 10, SAQ open chain on R LE and 1# ankle weight on L LE x 10 reps, progressed to 1# on R LE and 2# on L LE x 10 reps, knee squeezes in sit with soft blue ball 2 x 10 reps, hamstring curls with light orange level 1 REP band 2 x 10 reps each side, self aarom with leg lifter hip flexion while sitting in w/c x 10 reps each LE. Pt ended in bed to rest with all needs in reach. Ambulation not done this AM session due to new onset dizziness. Pt plans on having husband purchase a leg lifter for home use. PT discusses the possibility of power mobility for energy conservation with patient and she reports she already has a power mobility device, stating her son got it from a friend. PT asked pt to have family bring power mobility device (likely a scooter) in for PT to assess fit, pt safety driving, and condition of machine. Pt agrees to consider this.  Treatment Session 2: Pt received in w/c - agreeable to PT session. Gait Training - PT instructs pt in ambulation with RW x 60' req CGA for safety - compensated gait pattern with decreased foot clearance, but no LOB req assist to correct. Stair Training - PT instructs pt in lateral ascent up/down a 6" step with B hands on rail req PT assist to place each foot on & off (overall mod A) x 2 steps. Pt explains that at home, husband has been helping place feet on stairs for the past few months; he will pull up in his car as close as possible to the stairs, help her up the 2 steps with R handrail while 4WW awaits pt at top, then pt uses 4WW to ambulate to rocking chair and sit and rest on porch for a while. W/C Management - PT instructs pt in w/c propulsion on unit with SBA  and up/down low ramp with RW (2% grade) req supervision and verbal cues for technique. Pt ended up in recliner with all needs in reach. Pt reports dizziness improved from this AM, but still present. Pt is slowly progressing with PT, but will benefit from a lesser intense therapy schedule. POC changed to 15/7.    Therapy Documentation Precautions:  Precautions Precautions: Fall Precaution Comments: R ankle may invert; B leg weakness Restrictions Weight Bearing Restrictions: No Vital Signs: AM session Therapy Vitals Pulse Rate: (!) 123 BP: (!) 105/91 mmHg Patient Position (if appropriate): Sitting Oxygen Therapy SpO2: 99 % O2 Device: Not Delivered Pain: Pain Assessment Pain Assessment: 0-10 Pain Score: 2  Pain Type: Chronic pain Pain Location: Back Pain Orientation: Lower Pain Descriptors / Indicators: Aching Pain Onset: On-going Pain Intervention(s): Rest;Emotional support Multiple Pain Sites: No Treatment Session 2: Pt denies pain.    See Function Navigator for Current Functional Status.   Therapy/Group: Individual Therapy  Gabriela Rice 08/31/2015, 9:39 AM

## 2015-09-01 ENCOUNTER — Inpatient Hospital Stay (HOSPITAL_COMMUNITY): Payer: BLUE CROSS/BLUE SHIELD

## 2015-09-01 ENCOUNTER — Inpatient Hospital Stay (HOSPITAL_COMMUNITY): Payer: BLUE CROSS/BLUE SHIELD | Admitting: Physical Therapy

## 2015-09-01 LAB — GLUCOSE, CAPILLARY
GLUCOSE-CAPILLARY: 119 mg/dL — AB (ref 65–99)
GLUCOSE-CAPILLARY: 105 mg/dL — AB (ref 65–99)
Glucose-Capillary: 121 mg/dL — ABNORMAL HIGH (ref 65–99)
Glucose-Capillary: 119 mg/dL — ABNORMAL HIGH (ref 65–99)

## 2015-09-01 LAB — BASIC METABOLIC PANEL
Anion gap: 9 (ref 5–15)
BUN: 14 mg/dL (ref 6–20)
CO2: 25 mmol/L (ref 22–32)
CREATININE: 0.78 mg/dL (ref 0.44–1.00)
Calcium: 9.1 mg/dL (ref 8.9–10.3)
Chloride: 102 mmol/L (ref 101–111)
GFR calc Af Amer: >60 mL/min (ref >60)
Glucose, Bld: 134 mg/dL — ABNORMAL HIGH (ref 65–99)
POTASSIUM: 3.6 mmol/L (ref 3.5–5.1)
SODIUM: 136 mmol/L (ref 135–145)

## 2015-09-01 NOTE — Progress Notes (Signed)
[place] PHYSICAL MEDICINE & REHABILITATION     PROGRESS NOTE    Subjective/Complaints: Vertigo better today---was with her most of yesterday until she went to bed. Still feels a little "off" today.  Fatigued by therapy ROS: Denies CP, SOB, n/v/d.   Objective: Vital Signs: Blood pressure 101/71, pulse 90, temperature 97.8 F (36.6 C), temperature source Tympanic, resp. rate 16, height 5\' 4"  (1.626 m), weight 86.456 kg (190 lb 9.6 oz), SpO2 99 %. No results found. No results for input(s): WBC, HGB, HCT, PLT in the last 72 hours.  Recent Labs  09/01/15 0537  NA 136  K 3.6  CL 102  GLUCOSE 134*  BUN 14  CREATININE 0.78  CALCIUM 9.1   CBG (last 3)   Recent Labs  08/31/15 1631 08/31/15 2054 09/01/15 0634  GLUCAP 108* 133* 119*    Wt Readings from Last 3 Encounters:  08/23/15 86.456 kg (190 lb 9.6 oz)  08/20/15 90.719 kg (200 lb)  07/13/15 90.719 kg (200 lb)    Physical Exam:  GEN: no distress. Vital signs reviewed.  HENT: oral mucosa moist Head: Normocephalic and atraumatic.  Eyes: Conjunctivae and EOM are normal.   Cardiovascular: Normal rate and regular rhythm.  No murmur heard. Respiratory: Effort normal and breath sounds normal. No respiratory distress. She has no wheezes.  GI: Soft. Bowel sounds are normal. She exhibits no distension. There is no tenderness.  Musculoskeletal: She exhibits no edema or tenderness.  Neurological: She is alert and oriented.  Speech clear.  Follows basic commands without difficulty.  Moves BUE 5/5 proximal to distal RLE hip flexion 2-/5, knee extention 4-/5, dorsiflexion 3-/5, plantarflexion 4-/5  LLE hip flexion 2/5, knee extention 4-/5, dorsiflexion 3-/5, plantarflexion 4-/5  Extensor tone LLE. No nystagmus was appreciated with provocation  Skin: Skin is warm and dry. She is not diaphoretic.   Psychiatric: She has a normal mood and affect.   Assessment/Plan: 1. Functional deficits secondary to MS exacerbation with  weakness,sensory loss, balance deficits which require 3+ hours per day of interdisciplinary therapy in a comprehensive inpatient rehab setting. Physiatrist is providing close team supervision and 24 hour management of active medical problems listed below. Physiatrist and rehab team continue to assess barriers to discharge/monitor patient progress toward functional and medical goals.  Function:  Bathing Bathing position   Position: Shower  Bathing parts Body parts bathed by patient: Right arm, Left arm, Chest, Abdomen, Buttocks, Front perineal area, Right upper leg, Left upper leg, Right lower leg, Left lower leg    Bathing assist Assist Level: More than reasonable time, Supervision or verbal cues      Upper Body Dressing/Undressing Upper body dressing   What is the patient wearing?: Pull over shirt/dress Bra - Perfomed by patient: Thread/unthread right bra strap, Thread/unthread left bra strap, Hook/unhook bra (pull down sports bra)   Pull over shirt/dress - Perfomed by patient: Thread/unthread right sleeve, Thread/unthread left sleeve, Put head through opening, Pull shirt over trunk          Upper body assist Assist Level: Set up   Set up : To obtain clothing/put away  Lower Body Dressing/Undressing Lower body dressing   What is the patient wearing?: Pants, Shoes, Socks     Pants- Performed by patient: Pull pants up/down Pants- Performed by helper: Thread/unthread right pants leg, Thread/unthread left pants leg, Pull pants up/down Non-skid slipper socks- Performed by patient: Don/doff left sock Non-skid slipper socks- Performed by helper: Don/doff right sock, Don/doff left sock  Lower body assist Assist for lower body dressing: Touching or steadying assistance (Pt > 75%)      Toileting Toileting Toileting activity did not occur: No continent bowel/bladder event Toileting steps completed by patient: Performs perineal hygiene Toileting steps completed  by helper: Adjust clothing prior to toileting, Performs perineal hygiene, Adjust clothing after toileting Toileting Assistive Devices: Grab bar or rail  Toileting assist Assist level: Touching or steadying assistance (Pt.75%)   Transfers Chair/bed transfer   Chair/bed transfer method: Ambulatory, Stand pivot Chair/bed transfer assist level: Supervision or verbal cues Chair/bed transfer assistive device: Walker, Armrests, Bedrails     Locomotion Ambulation     Max distance: 105' Assist level: Supervision or verbal cues   Wheelchair   Type: Manual Max wheelchair distance: 150' Assist Level: Supervision or verbal cues  Cognition Comprehension Comprehension assist level: Follows complex conversation/direction with extra time/assistive device  Expression Expression assist level: Expresses complex ideas: With no assist  Social Interaction Social Interaction assist level: Interacts appropriately with others - No medications needed.  Problem Solving Problem solving assist level: Solves complex 90% of the time/cues < 10% of the time  Memory Memory assist level: Complete Independence: No helper   Medical Problem List and Plan: 1. Functional deficits secondary to MS exacerbation with RLE weakness, balance deficits and poor safety awareness.  -continue cir therapies--changed therapy intensity to 15hrs/7days due to intolerance fo 3+ hrs per day 2. DVT Prophylaxis/Anticoagulation: Pharmaceutical: Lovenox 3. Pain Management: On neurontin tid for neuropathy. Continue to use oxycodone prn for pain.--controlled at present  4. Mood: Team to provide ego support and education. LCSW to follow for evaluation and support.  5. Neuropsych: This patient is capable of making decisions on her own behalf. 6. Skin/Wound Care: Routine pressure relief measures 7. Fluids/Electrolytes/Nutrition: Monitor I/O. Encouraged patient to push po fluids.    8. Autoimmune hepatitis: On imuran daily. Continues to  have intermittent nausea with poor po intake.  9. Anxiety disorder: Was started on Lexapro a week PTA.   - xanax 0.5 BID and 1mg  qHS  - Cont to monitor 10. HTN: monitor BP bid. Continue Cozaar daily.  11. Dehydration: Encouraging PO fluids.   -Improving   12. DM type 2: Monitor BS ac/hs. Resumed metformin.     -CBGs in low 100s  -Will cont to monitor 13. Chronic UTIs: currently On trimpex for prophylaxis.  - 100k enterococcus sens to bactrim--started 11/30 14. Constipation: Pt had BM over night 15. ?vertigo: prn meclizine for now. May be fatigue, uti, volume related.   -will request vestibular evaluation by therapy to better determine etiology and potential treatment.   -enouraged increased fluid intake  LOS (Days) 9 A FACE TO FACE EVALUATION WAS PERFORMED  SWARTZ,ZACHARY T 09/01/2015 10:34 AM

## 2015-09-01 NOTE — Progress Notes (Signed)
Physical Therapy Weekly Progress Note  Patient Details  Name: Gabriela Rice MRN: [id number] Date of Birth: 09/18/1956  Beginning of progress report period: August 25, 2015 End of progress report period: September 01, 2015  Today's Date: 09/01/2015 PT Individual Time: 0830-0900 Treatment Session 2: 1430-1500 PT Individual Time Calculation (min): 30 min Treatment Session 2: 30 min  Patient has met 4 of 5 short term goals.  Pt has struggled with MS- related fatigue and temperature sensitivity, as well as new onset dizziness with transitional movements, but pt is slowly progressing with physical therapy. Pt's POC has been reduced to 15 hours/7 days.   Patient continues to demonstrate the following deficits: low activity tolerance, B LE weakness esp in hip flexors, difficulty with gait, transfers, and stairs, impaired standing balance, MS-related fatigue and temperature sensitivity and therefore will continue to benefit from skilled PT intervention to enhance overall performance with activity tolerance, balance, postural control, ability to compensate for deficits, functional use of  right lower extremity and left lower extremity and coordination.  Patient progressing toward long term goals..  Continue plan of care.  PT Short Term Goals Week 1:  PT Short Term Goal 1 (Week 1): Pt will demonstrates R side lie to sit transfer req mod A.  PT Short Term Goal 1 - Progress (Week 1): Met PT Short Term Goal 2 (Week 1): Pt will transfer consistently with min A.  PT Short Term Goal 2 - Progress (Week 1): Met PT Short Term Goal 3 (Week 1): Pt will ambulate x 60' with RW or 4WW req min A.  PT Short Term Goal 3 - Progress (Week 1): Met PT Short Term Goal 4 (Week 1): Pt will ascend/descend 2 stairs with B rails req min A.  PT Short Term Goal 4 - Progress (Week 1): Progressing toward goal PT Short Term Goal 5 (Week 1): Pt will transfer in/out of car with min A.  PT Short Term Goal 5 - Progress (Week 1):  Met Week 2:  PT Short Term Goal 1 (Week 2): STGs = LTGs due to ELOS  Skilled Therapeutic Interventions/Progress Updates:    Treatment Session 1: Pt received up in recliner after breakfast, c/o bottom soreness, agreeable to PT. Therapeutic Activity - PT instructs pt in stand-step transfer with RW to/from bed req Supervision for safety. PT hands pt leg lifter and pt demonstrate sit to supine transfer with leg lifter and R bedrail req supervision and increased time. PT explains to pt she can use leg lifter to cross R leg over L, then roll onto L side - pt demonstrates back understanding with supervision and verbal cues. Pt then completes modified R side lie to sit transfer with bedrail req CGA-min A. Gait Training - PT instructs pt in ambulation with RW x 105' req close SBA - no LOB noted - good step length. Stair training - PT instructs pt in ascending/descending 2 steps with B hands on 1 rail req assist to place/remove feet from steps (due to hip flexor weakness) - mod A overall. Pt ended in bed to rest with all needs in reach, awaiting OT.   Treatment Session 2: Pt received in bed - c/o prior back pain, but now none, and also fatigue, which is pt's tendency by PM. Therapeutic Activity - Pt transfers with HOB partially elevated and R bedrail req min A, able to get legs off bed herself, then stand-steps transfers with SBA bed to w/c. PT instructs pt in car transfer with RW req  min A boost to stand, with pt demonstrating ability to use B UEs to assist legs in and out of car. PT explains to pt that she can always place a folded blanket in passenger seat to elevate seat height slightly, which will assist pt in sit to stand out of car. Pt self propels manual w/c back to room and transfers w/c to bed with SBA and RW, then mod A for B LEs sit to supine, with all needs in reach. Continue per PT POC.    Therapy Documentation Precautions:  Precautions Precautions: Fall Precaution Comments: R ankle may invert; B  leg weakness Restrictions Weight Bearing Restrictions: No Pain: Pain Assessment Pain Assessment: 0-10 Pain Score: 4  Pain Type: Acute pain Pain Location: Buttocks Pain Descriptors / Indicators: Sore Pain Onset: Gradual Pain Intervention(s): Repositioned Multiple Pain Sites: No Treatment Session 2: Pt denies pain.   See Function Navigator for Current Functional Status.  Therapy/Group: Individual Therapy  Jaren Kearn M 09/01/2015, 8:36 AM

## 2015-09-01 NOTE — Progress Notes (Signed)
Occupational Therapy Weekly Progress Note  Patient Details  Name: Gabriela Rice MRN: [id number] Date of Birth: March 22, 1956  Beginning of progress report period: August 25, 2015 End of progress report period: September 01, 2015  Today's Date: 09/01/2015 OT Individual Time: 1100-1200 OT Individual Time Calculation (min): 60 min    Patient has met 2 of 5 short term goals.  Pt required modification of treatment regime to 15/7 due to persistent fatigue and weakness after treatments limiting performance.   Pt has been unable to advance in use of AE to perform lower body dressing despite cues and re-ed d/t fatigue after showering.   Patient continues to demonstrate the following deficits: Poor endurance, impaired mobility, RLE weakness, back pain and therefore will continue to benefit from skilled OT intervention to enhance overall performance with BADL.  Patient not progressing toward long term goals.  See goal revision..  Plan of care revisions: Shower transfer and lower body dressing goals modified to reflect need for assistance recommended for safety.  OT Short Term Goals Week 1:  OT Short Term Goal 1 (Week 1): Pt will complete toilet transfer with supervision OT Short Term Goal 1 - Progress (Week 1): Progressing toward goal OT Short Term Goal 2 (Week 1): Pt will dress upper body with min assist OT Short Term Goal 2 - Progress (Week 1): Met OT Short Term Goal 3 (Week 1): Pt will dress lower body with mod assist OT Short Term Goal 3 - Progress (Week 1): Progressing toward goal OT Short Term Goal 4 (Week 1): Pt will maintain supported standing balance with steadying assist during assisted lower body dressing OT Short Term Goal 4 - Progress (Week 1): Met OT Short Term Goal 5 (Week 1): Pt will complete HEP to improve grip/pinch strength with supervision OT Short Term Goal 5 - Progress (Week 1): Partly met Week 2:  OT Short Term Goal 1 (Week 2): STG=LTG due to short LOS  Skilled Therapeutic  Interventions/Progress Updates: ADL-retraining at shower level with focus on improved transfers, endurance and effective use of AE (reacher, LH sponge).   Pt received in recliner and ready for planned treatment.  Pt able to ambulate to bathroom and transfer to tub bench in walk-in shoer with steadying assist for safety. OT re-educated pt on use of reacher to remove socks however pt is unable to perform task d/t incoordination in limited space of shower.   Pt showered w/o using LH sponge d/t her preference despite being prompted to use device for back and feet.   Pt was exhausted after tepid shower and required use of w/c to exit bathoom but dressed at sink after brief rest break.   Pt able to stand supported to maintain balance with min guard assist as therapist pulled up garments after lacing brief and pants up right.   Pt laced them over left leg.  Pt is unable to lift right leg sufficiently to enable herself to dress right side.  Long term goals to reflect that pt will likely continue to require assist with dressing lower body.     Therapy Documentation Precautions:  Precautions Precautions: Fall Precaution Comments: R ankle may invert; B leg weakness Restrictions Weight Bearing Restrictions: No  Vital Signs: Therapy Vitals Temp: 98.4 F (36.9 C) Temp Source: Oral Pulse Rate: 89 Resp: 17 BP: 109/61 mmHg Patient Position (if appropriate): Lying Oxygen Therapy SpO2: 99 % O2 Device: Not Delivered   Pain: No/denies pain    See Function Navigator for Current Functional Status.  Therapy/Group: Individual Therapy   Second session: Time: 1330-1400 Time Calculation (min):  30 min  Pain Assessment: 6/10, back, upper and lower  Skilled Therapeutic Interventions: Therapeutic exercise with focus on BUE strengthening, shoulder strengthening using weighted bar (2 lbs and 5 lbs).   Pt unable to participate in AE retraining as planned due to residual LE weakness from earlier treatments.   Pt  able to complete stand-pivot transfer from recliner to w/c with steadying assist and was escorted to gym for UE strengthening.   Pt completed seated shoulder press, anterior lateral raise, and bicep curls with extra time and rest breaks in between each set of 10 reps of each exercise.   Pt reported exacerbation of neck pain after exercises and accepted massage at upper trapezius with use of massage wand to resolve symptoms.   Pt returned to bed with mod assist to lift both legs into bed at end of session in order to rest before next appointment.   See FIM for current functional status  Therapy/Group: Individual Therapy  [place] 09/01/2015, 3:16 PM

## 2015-09-02 ENCOUNTER — Inpatient Hospital Stay (HOSPITAL_COMMUNITY): Payer: BLUE CROSS/BLUE SHIELD | Admitting: Physical Therapy

## 2015-09-02 ENCOUNTER — Inpatient Hospital Stay (HOSPITAL_COMMUNITY): Payer: BLUE CROSS/BLUE SHIELD | Admitting: Occupational Therapy

## 2015-09-02 LAB — GLUCOSE, CAPILLARY
GLUCOSE-CAPILLARY: 129 mg/dL — AB (ref 65–99)
GLUCOSE-CAPILLARY: 168 mg/dL — AB (ref 65–99)
Glucose-Capillary: 103 mg/dL — ABNORMAL HIGH (ref 65–99)
Glucose-Capillary: 112 mg/dL — ABNORMAL HIGH (ref 65–99)

## 2015-09-02 NOTE — Progress Notes (Signed)
Physical Therapy Session Note  Patient Details  Name: Gabriela Rice MRN: [id number] Date of Birth: 03/28/1956  Today's Date: 09/02/2015 PT Individual Time: 1100-1200 PT Individual Time Calculation (min): 60 min   Short Term Goals: Week 1:  PT Short Term Goal 1 (Week 1): Pt will demonstrates R side lie to sit transfer req mod A.  PT Short Term Goal 1 - Progress (Week 1): Met PT Short Term Goal 2 (Week 1): Pt will transfer consistently with min A.  PT Short Term Goal 2 - Progress (Week 1): Met PT Short Term Goal 3 (Week 1): Pt will ambulate x 60' with RW or 4WW req min A.  PT Short Term Goal 3 - Progress (Week 1): Met PT Short Term Goal 4 (Week 1): Pt will ascend/descend 2 stairs with B rails req min A.  PT Short Term Goal 4 - Progress (Week 1): Progressing toward goal PT Short Term Goal 5 (Week 1): Pt will transfer in/out of car with min A.  PT Short Term Goal 5 - Progress (Week 1): Met  Skilled Therapeutic Interventions/Progress Updates:  Pt was seen bedside in the am. Pt propelled w/c to gym about 150 feet with B UEs and S. Pt transferred w/c to edge of mat with rolling walker and min guard. Pt transferred edge of mat to supine with mod A and verbal cues. Pt performed LE exercises including heel slides, hip abd/add, and SAQs, 2 sets x 10 reps each. Pt transferred supine to edge of mat with mod A and verbal cues. Pt transferred edge of mat to w/c with rolling walker and min guard. Pt ambulated 50 feet x 2 with rolling walker and S. Pt propelled w/c back to room about 150 feet with B UEs and S. Pt transferred w/c to recliner with rolling walker and min guard. Pt left sitting up in recliner with call bell within reach.   Therapy Documentation Precautions:  Precautions Precautions: Fall Precaution Comments: R ankle may invert; B leg weakness Restrictions Weight Bearing Restrictions: No General:   Pain: Pt c/o 4/10 back pain.   See Function Navigator for Current Functional  Status.   Therapy/Group: Individual Therapy  [name] 09/02/2015, 12:50 PM

## 2015-09-02 NOTE — Progress Notes (Signed)
Occupational Therapy Session Note  Patient Details  Name: Gabriela Rice MRN: [id number] Date of Birth: October 31, 1955  Today's Date: 09/02/2015 OT Individual Time: 1531-1600 OT Individual Time Calculation (min): 29 min    Short Term Goals: Week 2:  OT Short Term Goal 1 (Week 2): STG=LTG due to short LOS  Skilled Therapeutic Interventions/Progress Updates:  Upon entering the room, pt seated in recliner chair and calling for assistance to transfer back to bed. Pt with c/o 6/10 lower back pain. RN notified and medications given this session. Pt performed stand pivot transfer from recliner chair >bed with steady assistance. Pt required mod A for B LEs into bed from sit >supine. OT educated pt on energy conservation techniques for self care and general principles for increased independence. Pt verbalized understanding but education to continue. Call bell within reach upon exiting the room.   Therapy Documentation Precautions:  Precautions Precautions: Fall Precaution Comments: R ankle may invert; B leg weakness Restrictions Weight Bearing Restrictions: No General:   Vital Signs: Therapy Vitals Temp: 98.7 F (37.1 C) Temp Source: Oral Pulse Rate: (!) 102 Resp: 19 BP: 106/63 mmHg Patient Position (if appropriate): Lying Oxygen Therapy SpO2: 98 % O2 Device: Not Delivered Pain: Pain Assessment Pain Assessment: 0-10 Pain Score: 6 Pain Type: Acute pain Pain Location: Back  See Function Navigator for Current Functional Status.   Therapy/Group: Individual Therapy  Phineas Semen 09/02/2015, 4:31 PM

## 2015-09-02 NOTE — Progress Notes (Signed)
Patient ID: Gabriela Rice, female   DOB: 1956/09/14, 59 y.o.   MRN: [id number]  [facility]     PROGRESS NOTE   09/02/15.  59 year old patient admitted for CIR with  functional deficits secondary to MS exacerbation with RLE weakness, balance deficits.    Subjective/Complaints: Vertigo better today---was with her most of yesterday until she went to bed. Only complaint is abdominal fullness and tightness which has been chronic.  States that she has had neg Abd CT scan  Fatigued by therapy.  Constipation resolved.   ROS: Denies CP, SOB, n/v/d.   Past Medical History  Diagnosis Date  . DM (diabetes mellitus) ([place])   . Hyperlipidemia   . Multiple sclerosis ([place])   . Allergic rhinitis   . Elevated LFTs   . Obesity   . Vitamin D deficiency   . Abnormality of gait 02/16/2013  . Colon polyps   . Diverticulosis   . Hemorrhoids   . Hepatitis     with cholestasis  . Hemorrhoids   . Autoimmune hepatitis ([place])   . Allergy   . Hypertension   . Autoimmune hepatitis ([place])   . Anxiety   . Migraines      Objective: Vital Signs: Blood pressure 121/61, pulse 91, temperature 98.2 F (36.8 C), temperature source Oral, resp. rate 18, height 5\' 4"  (1.626 m), weight 190 lb 9.6 oz (86.456 kg), SpO2 97 %. No results found. No results for input(s): WBC, HGB, HCT, PLT in the last 72 hours.  Recent Labs  09/01/15 0537  NA 136  K 3.6  CL 102  GLUCOSE 134*  BUN 14  CREATININE 0.78  CALCIUM 9.1   CBG (last 3)   Recent Labs  09/01/15 1626 09/01/15 2051 09/02/15 0645  GLUCAP 105* 119* 129*    Wt Readings from Last 3 Encounters:  08/23/15 190 lb 9.6 oz (86.456 kg)  08/20/15 200 lb (90.719 kg)  07/13/15 200 lb (90.719 kg)   Lab Results  Component Value Date   HGBA1C 6.1* 08/22/2015     Intake/Output Summary (Last 24 hours) at 09/02/15 0906 Last data filed at 09/02/15 0707  Gross per 24 hour  Intake    720 ml  Output      0 ml  Net    720  ml    Patient Vitals for the past 24 hrs:  BP Temp Temp src Pulse Resp SpO2  09/02/15 0423 121/61 mmHg 98.2 F (36.8 C) Oral 91 18 97 %  09/01/15 1414 109/61 mmHg 98.4 F (36.9 C) Oral 89 17 99 %    Physical Exam:  GEN: no distress. Vital signs reviewed.  Obese  HENT: oral mucosa moist Head: Normocephalic and atraumatic.  Eyes: Conjunctivae and EOM are normal.   Cardiovascular: Normal rate and regular rhythm.  No murmur heard. Respiratory: Effort normal and breath sounds normal. No respiratory distress. She has no wheezes.  GI: Soft. Bowel sounds are normal. She exhibits no distension. There is no tenderness.  Musculoskeletal: She exhibits no edema or tenderness.  Neurological: She is alert and oriented.  Speech clear.  Follows basic commands without difficulty.  Moves BUE 5/5 proximal to distal LE weakness Skin: Skin is warm and dry. She is not diaphoretic.   Psychiatric: She has a normal mood and affect.     Medical Problem List and Plan: 1. Functional deficits secondary to MS exacerbation with RLE weakness, balance deficits and poor safety awareness. 2.  DVT Prophylaxis/Anticoagulation: Pharmaceutical: Lovenox  3. Pain Management: On neurontin tid for neuropathy. Continue to use oxycodone prn for pain.--controlled at present     4. Autoimmune hepatitis: On imuran daily. Continues to have intermittent nausea with poor po intake.   5. HTN: monitor BP bid. Continue Cozaar daily.   6. DM type 2: Monitor BS ac/hs. Resumed metformin.     -CBGs in low 100s  -Will cont to monitor 7. Chronic UTIs: currently On trimpex for prophylaxis.  - 100k enterococcus sens to bactrim--started 11/30 8. Constipation: Pt had BM over night   LOS (Days) 10 A FACE TO FACE EVALUATION WAS PERFORMED  Nyoka Cowden 09/02/2015 8:53 AM

## 2015-09-02 NOTE — Progress Notes (Signed)
Occupational Therapy Session Note  Patient Details  Name: Gabriela Rice MRN: [id number] Date of Birth: 1956-02-16  Today's Date: 09/02/2015 OT Individual Time: 0900-1000 OT Individual Time Calculation (min): 60 min    Short Term Goals: Week 2:  OT Short Term Goal 1 (Week 2): STG=LTG due to short LOS  Skilled Therapeutic Interventions/Progress Updates:   Patient seen for ADL retraining and instruction this morning with emphasis on activity tolerance, standing tolerance, standing balance, functional mobility, energy conservation and use of AE.   Patient upright in recliner upon therapist arrival.  Patient educated on purpose of OT and patient agreeable to treatment.  Patient completes recliner to wheelchair via stand pivot with CGA.  Patient gathers clothing wheelchair level and places by sink for dressing after shower.  Patient completes wheelchair <> transfer tub bench in shower with use of grab bars stand pivot transfer with CGA.  Patient bathes seated with supervision with use of long handled sponge to wash B lower legs/feet and back; stands with close SBA to wash peri area/buttocks with verbal cues for holding on to grab bar when standing for safety and decreased fall risk.  Patient completes dressing wheelchair level at the sink.  Patient completes UB dressing with modified independence and LB dressing with min assist to don R sock and stand balance for clothing management over hips.  Patient utilized [employer] teacher and sockaid to assist with threading pants over B lower legs/feet and sockaid to don B socks.  Patient sit <> stand SBA to CGA secondary to fatigue.  Patient grooms at sink with modified independence to brush/dry hair and to complete teethbrushing.  Patient educated on energy conservation techniques for ADL retraining, such as threading all items on BLEs with socks/shoes donned and only standing 1 time to save energy vs repeated standing trials for brief, then pants, and then cross legs  back and forth to don socks and then shoes.  Patient receptive but could benefit from continued education.   Therapy Documentation Precautions:  Precautions Precautions: Fall Precaution Comments: R ankle may invert; B leg weakness Restrictions Weight Bearing Restrictions: No Pain: Pain Assessment Pain Assessment: 0-10 Pain Score: 4  Pain Type: Acute pain Pain Location: Back  See Function Navigator for Current Functional Status.   Therapy/Group: Individual Therapy  Osa Craver 09/02/2015, 12:33 PM

## 2015-09-03 ENCOUNTER — Inpatient Hospital Stay (HOSPITAL_COMMUNITY): Payer: BLUE CROSS/BLUE SHIELD | Admitting: Physical Therapy

## 2015-09-03 ENCOUNTER — Inpatient Hospital Stay (HOSPITAL_COMMUNITY): Payer: BLUE CROSS/BLUE SHIELD | Admitting: Occupational Therapy

## 2015-09-03 LAB — GLUCOSE, CAPILLARY
GLUCOSE-CAPILLARY: 124 mg/dL — AB (ref 65–99)
GLUCOSE-CAPILLARY: 114 mg/dL — AB (ref 65–99)
Glucose-Capillary: 96 mg/dL (ref 65–99)
Glucose-Capillary: 108 mg/dL — ABNORMAL HIGH (ref 65–99)

## 2015-09-03 MED ORDER — SENNOSIDES-DOCUSATE SODIUM 8.6-50 MG PO TABS: 1.0000 | ORAL_TABLET | Freq: Every evening | ORAL | Status: DC | PRN

## 2015-09-03 MED ORDER — POLYETHYLENE GLYCOL 3350 17 G PO PACK
17.0000 g | PACK | Freq: Every day | ORAL | Status: DC | PRN
  Administered 2015-09-04: 17 g via ORAL
  Filled 2015-09-03: qty 1

## 2015-09-03 NOTE — Progress Notes (Signed)
Occupational Therapy Session Note  Patient Details  Name: Gabriela Rice MRN: [id number] Date of Birth: 10/23/1955  Today's Date: 09/03/2015 OT Individual Time:  -   0800-0900  (60 min)      Short Term Goals: Week 1:  OT Short Term Goal 1 (Week 1): Pt will complete toilet transfer with supervision OT Short Term Goal 1 - Progress (Week 1): Progressing toward goal OT Short Term Goal 2 (Week 1): Pt will dress upper body with min assist OT Short Term Goal 2 - Progress (Week 1): Met OT Short Term Goal 3 (Week 1): Pt will dress lower body with mod assist OT Short Term Goal 3 - Progress (Week 1): Progressing toward goal OT Short Term Goal 4 (Week 1): Pt will maintain supported standing balance with steadying assist during assisted lower body dressing OT Short Term Goal 4 - Progress (Week 1): Met OT Short Term Goal 5 (Week 1): Pt will complete HEP to improve grip/pinch strength with supervision OT Short Term Goal 5 - Progress (Week 1): Partly met Week 2:  OT Short Term Goal 1 (Week 2): STG=LTG due to short LOS  Skilled Therapeutic Interventions/Progress Updates:     Pt performed stand pivot transfer from bed>wc>3n1over toilet with steady assistance. Unable to void or have BM.  Transferred >wc>tub bench with steady to SBA.  Pt. Showered using LH sponge with SBA.  Pt. Able to doff footies with reacher.    Patient bathes seated with distant supervision with use of long handled sponge to wash B lower legs/feet and back; stands with close SBA to wash peri area/buttocks with holding on to grab bar and SBA.   Pt completed  grooming at wc level at sink for Energy conservation strategies.   Pt dried hair with blow dryer with set up assist.   Patient completes UB dressing with set up.  Used AE for LB dressing with min assist to don R sock and stand balance for clothing management over hips. Patient utilized reacher and sockaid to assist with threading pants over B lower legs/feet and sockaid to don B  socks. Pt requires mod assist with sit to stand at end of session with increased fatigue.    Therapy Documentation Precautions:  Precautions Precautions: Fall Precaution Comments: R ankle may invert; B leg weakness Restrictions Weight Bearing Restrictions: No General:   Vital Signs: Therapy Vitals Temp: 98.1 F (36.7 C) Temp Source: Oral Pulse Rate: 87 Resp: 18 BP: 109/72 mmHg Patient Position (if appropriate): Lying Oxygen Therapy SpO2: 97 % O2 Device: Not Delivered Pain:  3/10          See Function Navigator for Current Functional Status.   Therapy/Group: Individual Therapy  ,  J 09/03/2015, 7:50 AM  

## 2015-09-03 NOTE — Progress Notes (Signed)
Physical Therapy Session Note  Patient Details  Name: Gabriela Rice MRN: [id number] Date of Birth: Feb 04, 1956  Today's Date: 09/03/2015 PT Individual Time: 1000-1100 PT Individual Time Calculation (min): 60 min   Short Term Goals: Week 1:  PT Short Term Goal 1 (Week 1): Pt will demonstrates R side lie to sit transfer req mod A.  PT Short Term Goal 1 - Progress (Week 1): Met PT Short Term Goal 2 (Week 1): Pt will transfer consistently with min A.  PT Short Term Goal 2 - Progress (Week 1): Met PT Short Term Goal 3 (Week 1): Pt will ambulate x 60' with RW or 4WW req min A.  PT Short Term Goal 3 - Progress (Week 1): Met PT Short Term Goal 4 (Week 1): Pt will ascend/descend 2 stairs with B rails req min A.  PT Short Term Goal 4 - Progress (Week 1): Progressing toward goal PT Short Term Goal 5 (Week 1): Pt will transfer in/out of car with min A.  PT Short Term Goal 5 - Progress (Week 1): Met  Skilled Therapeutic Interventions/Progress Updates:  Pt was seen bedside in the am. Pt transferred recliner to w/c with rolling walker and S. Pt propelled w/c to gym with B UEs and S. Pt performed multiple sit to stand and stand pivot transfers with rolling walker and S with verbal cues. Pt ambulated 100 feet with rolling walker and S with verbal cues. Pt performed standing target taps and alternating target taps, 3 sets x 5 reps each. Pt transitioned to mat. Pt transferred edge of mat to supine and supine to edge of mat with mod A and verbal cues. On mat pt performed heel slides, hip abd/add and SAQs, 2 sets x 10 reps each. Pt propelled w/c back to room with B UEs and S. Pt transferred w/c to recliner with rolling walker and S.   Therapy Documentation Precautions:  Precautions Precautions: Fall Precaution Comments: R ankle may invert; B leg weakness Restrictions Weight Bearing Restrictions: No General:   Pain: Pt c/o 2/10 low back pain.   See Function Navigator for Current Functional  Status.   Therapy/Group: Individual Therapy  [name] 09/03/2015, 12:48 PM

## 2015-09-03 NOTE — Progress Notes (Signed)
Physical Therapy Session Note  Patient Details  Name: Gabriela Rice MRN: [id number] Date of Birth: 01/08/1956  Today's Date: 09/03/2015 PT Individual Time: 1345-1415 PT Individual Time Calculation (min): 30 min   Short Term Goals: Week 1:  PT Short Term Goal 1 (Week 1): Pt will demonstrates R side lie to sit transfer req mod A.  PT Short Term Goal 1 - Progress (Week 1): Met PT Short Term Goal 2 (Week 1): Pt will transfer consistently with min A.  PT Short Term Goal 2 - Progress (Week 1): Met PT Short Term Goal 3 (Week 1): Pt will ambulate x 60' with RW or 4WW req min A.  PT Short Term Goal 3 - Progress (Week 1): Met PT Short Term Goal 4 (Week 1): Pt will ascend/descend 2 stairs with B rails req min A.  PT Short Term Goal 4 - Progress (Week 1): Progressing toward goal PT Short Term Goal 5 (Week 1): Pt will transfer in/out of car with min A.  PT Short Term Goal 5 - Progress (Week 1): Met  Skilled Therapeutic Interventions/Progress Updates:  Pt was seen bedside in the pm. Pt transferred supine to edge of bed with head of bed elevated, side rail and S with increased time. Pt transferred edge of bed to w/c with rolling walker and S. Pt propelled w/c to gym with B UEs and S. Pt ascended/descended 2 stairs with R rail, descended the stairs going backwards, mod A and verbal cues. Pt ambulated 140 feet with rolling walker and S with several standing rest breaks. Pt propelled w/c back to room with B UEs and S. Pt transferred w/c to edge of bed with S and rolling walker. Pt transferred edge of bed to supine with mod A and verbal cues.   Therapy Documentation Precautions:  Precautions Precautions: Fall Precaution Comments: R ankle may invert; B leg weakness Restrictions Weight Bearing Restrictions: No General:   Pain: Pt c/o mod low back pain.   See Function Navigator for Current Functional Status.   Therapy/Group: Individual Therapy  [name] 09/03/2015, 3:41 PM

## 2015-09-03 NOTE — Progress Notes (Signed)
Patient ID: Gabriela Rice, female   DOB: 01-30-1956, 59 y.o.   MRN: [id number]  Patient ID: Gabriela Rice, female   DOB: Jun 10, 1956, 59 y.o.   MRN: [id number]  [facility]     PROGRESS NOTE   09/03/15.  59 year old patient admitted for CIR with  functional deficits secondary to MS exacerbation with RLE weakness, balance deficits.    Subjective/Complaints: Good night last night.  Constipation has resolved and she is having some loose stools.   ROS: Denies CP, SOB, n/v/d.   Past Medical History  Diagnosis Date  . DM (diabetes mellitus) ([place])   . Hyperlipidemia   . Multiple sclerosis ([place])   . Allergic rhinitis   . Elevated LFTs   . Obesity   . Vitamin D deficiency   . Abnormality of gait 02/16/2013  . Colon polyps   . Diverticulosis   . Hemorrhoids   . Hepatitis     with cholestasis  . Hemorrhoids   . Autoimmune hepatitis ([place])   . Allergy   . Hypertension   . Autoimmune hepatitis ([place])   . Anxiety   . Migraines      Objective: Vital Signs: Blood pressure 109/72, pulse 87, temperature 98.1 F (36.7 C), temperature source Oral, resp. rate 18, height 5\' 4"  (1.626 m), weight 190 lb 9.6 oz (86.456 kg), SpO2 97 %. No results found. No results for input(s): WBC, HGB, HCT, PLT in the last 72 hours.  Recent Labs  09/01/15 0537  NA 136  K 3.6  CL 102  GLUCOSE 134*  BUN 14  CREATININE 0.78  CALCIUM 9.1   CBG (last 3)   Recent Labs  09/02/15 1753 09/02/15 2036 09/03/15 0652  GLUCAP 168* 112* 124*    Wt Readings from Last 3 Encounters:  08/23/15 190 lb 9.6 oz (86.456 kg)  08/20/15 200 lb (90.719 kg)  07/13/15 200 lb (90.719 kg)   Lab Results  Component Value Date   HGBA1C 6.1* 08/22/2015     Intake/Output Summary (Last 24 hours) at 09/03/15 0840 Last data filed at 09/03/15 0810  Gross per 24 hour  Intake    720 ml  Output      0 ml  Net    720 ml    Patient Vitals for the past 24 hrs:  BP Temp Temp src Pulse  Resp SpO2  09/03/15 0546 109/72 mmHg 98.1 F (36.7 C) Oral 87 18 97 %  09/02/15 1405 106/63 mmHg 98.7 F (37.1 C) Oral (!) 102 19 98 %    Physical Exam:  GEN: no distress. Vital signs reviewed.  Obese  HENT: oral mucosa moist Head: Normocephalic and atraumatic.  Eyes: Conjunctivae and EOM are normal.   Cardiovascular: Normal rate and regular rhythm.  No murmur heard. Respiratory: Effort normal and breath sounds normal. No respiratory distress. She has no wheezes.  GI: Soft. Bowel sounds are normal. She exhibits no distension. There is no tenderness.  Musculoskeletal: She exhibits no edema or tenderness.  Neurological: She is alert and oriented.  Speech clear.  Alert and appropriate Moves BUE 5/5 proximal to distal LE weakness Skin: Skin is warm and dry. She is not diaphoretic.   Psychiatric: She has a normal mood and affect.     Medical Problem List and Plan: 1. Functional deficits secondary to MS exacerbation with RLE weakness, balance deficits and poor safety awareness. 2.  DVT Prophylaxis/Anticoagulation: Pharmaceutical: Lovenox 3. Pain Management: On neurontin tid for neuropathy. Continue to use  oxycodone prn for pain.--controlled at present     4. Autoimmune hepatitis: On imuran daily. Continues to have intermittent nausea with poor po intake.   5. HTN: monitor BP bid. Continue Cozaar daily.   6. DM type 2: Monitor BS ac/hs. Resumed metformin.     -CBGs in low 100s  -Will cont to monitor 7. Chronic UTIs: currently On trimpex for prophylaxis.  - 100k enterococcus sens to bactrim--started 11/30 8. Constipation: resolved    LOS (Days) 11 A FACE TO FACE EVALUATION WAS PERFORMED  Nyoka Cowden 09/03/2015 8:40 AM

## 2015-09-04 ENCOUNTER — Inpatient Hospital Stay (HOSPITAL_COMMUNITY): Payer: BLUE CROSS/BLUE SHIELD

## 2015-09-04 ENCOUNTER — Inpatient Hospital Stay (HOSPITAL_COMMUNITY): Payer: BLUE CROSS/BLUE SHIELD | Admitting: Physical Therapy

## 2015-09-04 LAB — GLUCOSE, CAPILLARY
GLUCOSE-CAPILLARY: 87 mg/dL (ref 65–99)
GLUCOSE-CAPILLARY: 129 mg/dL — AB (ref 65–99)
GLUCOSE-CAPILLARY: 106 mg/dL — AB (ref 65–99)
Glucose-Capillary: 136 mg/dL — ABNORMAL HIGH (ref 65–99)

## 2015-09-04 NOTE — Progress Notes (Signed)
[facility] PHYSICAL MEDICINE & REHABILITATION     PROGRESS NOTE    Subjective/Complaints: Pt seen and examined working with OT today.  She states she is doing better overall.  Her constipation is resolved, her back pain is managable and her anxiety is improved.  She believes her strength is improving as well, however, feels a little fatigued after her shower today.    ROS: Denies CP, SOB, n/v/d.   Objective: Vital Signs: Blood pressure 123/71, pulse 94, temperature 98.5 F (36.9 C), temperature source Oral, resp. rate 18, height 5\' 4"  (1.626 m), weight 86.456 kg (190 lb 9.6 oz), SpO2 95 %. No results found. No results for input(s): WBC, HGB, HCT, PLT in the last 72 hours. No results for input(s): NA, K, CL, GLUCOSE, BUN, CREATININE, CALCIUM in the last 72 hours.  Invalid input(s): CO CBG (last 3)   Recent Labs  09/03/15 1625 09/03/15 2055 09/04/15 0645  GLUCAP 108* 114* 136*    Wt Readings from Last 3 Encounters:  08/23/15 86.456 kg (190 lb 9.6 oz)  08/20/15 90.719 kg (200 lb)  07/13/15 90.719 kg (200 lb)    Physical Exam:  GEN: no distress. Vital signs reviewed.  HENT: oral mucosa moist Head: Normocephalic and atraumatic.  Eyes: Conjunctivae and EOM are normal.   Cardiovascular: Normal rate and regular rhythm. No murmur heard. Respiratory: Effort normal and breath sounds normal. No respiratory distress. She has no wheezes.  GI: Soft. Bowel sounds are normal. She exhibits no distension. There is no tenderness.  Musculoskeletal: She exhibits no edema or tenderness.  Neurological: She is alert and oriented. Speech clear.  Follows commands without difficulty.  Motor: BUE 5/5 proximal to distal RLE hip flexion 2-/5, knee extention 2/5, dorsiflexion 2/5, plantarflexion 2/5  LLE hip flexion 2-/5, knee extention 2/5, dorsiflexion 2/5, plantarflexion 2/5  Extensor tone LLE. No nystagmus was appreciated with provocation  Skin: Skin is warm and dry. She is not  diaphoretic.   Psychiatric: She has a normal mood and affect.   Assessment/Plan: 1. Functional deficits secondary to MS exacerbation with weakness,sensory loss, balance deficits which require 3+ hours per day of interdisciplinary therapy in a comprehensive inpatient rehab setting. Physiatrist is providing close team supervision and 24 hour management of active medical problems listed below. Physiatrist and rehab team continue to assess barriers to discharge/monitor patient progress toward functional and medical goals.  Function:  Bathing Bathing position   Position: Shower  Bathing parts Body parts bathed by patient: Right arm, Left arm, Chest, Abdomen, Front perineal area, Buttocks, Right upper leg, Left lower leg, Right lower leg, Left upper leg Body parts bathed by helper: Back  Bathing assist Assist Level: Supervision or verbal cues      Upper Body Dressing/Undressing Upper body dressing   What is the patient wearing?: Pull over shirt/dress, Bra Bra - Perfomed by patient: Thread/unthread right bra strap, Thread/unthread left bra strap, Hook/unhook bra (pull down sports bra)   Pull over shirt/dress - Perfomed by patient: Thread/unthread right sleeve, Thread/unthread left sleeve, Put head through opening, Pull shirt over trunk          Upper body assist Assist Level: More than reasonable time   Set up : To obtain clothing/put away  Lower Body Dressing/Undressing Lower body dressing   What is the patient wearing?: Pants, Non-skid slipper socks     Pants- Performed by patient: Thread/unthread right pants leg, Thread/unthread left pants leg, Pull pants up/down Pants- Performed by helper: Pull pants up/down Non-skid slipper  socks- Performed by patient: Don/doff left sock Non-skid slipper socks- Performed by helper: Don/doff right sock                  Lower body assist Assist for lower body dressing: Touching or steadying assistance (Pt > 75%)       Toileting Toileting Toileting activity did not occur: N/A Toileting steps completed by patient: Adjust clothing prior to toileting, Performs perineal hygiene Toileting steps completed by helper: Adjust clothing after toileting Toileting Assistive Devices: Grab bar or rail  Toileting assist Assist level: Touching or steadying assistance (Pt.75%)   Transfers Chair/bed transfer   Chair/bed transfer method: Stand pivot Chair/bed transfer assist level: Supervision or verbal cues Chair/bed transfer assistive device: Armrests, Medical sales representative     Max distance: 140 Assist level: Supervision or verbal cues   Wheelchair   Type: Manual Max wheelchair distance: 150 Assist Level: Supervision or verbal cues  Cognition Comprehension Comprehension assist level: Follows complex conversation/direction with no assist  Expression Expression assist level: Expresses complex ideas: With no assist  Social Interaction Social Interaction assist level: Interacts appropriately with others - No medications needed.  Problem Solving Problem solving assist level: Solves complex problems: Recognizes & self-corrects  Memory Memory assist level: Complete Independence: No helper   Medical Problem List and Plan: 1. Functional deficits secondary to MS exacerbation with RLE weakness, balance deficits and poor safety awareness.  -Continue CIR 2. DVT Prophylaxis/Anticoagulation: Pharmaceutical: Lovenox 3. Pain Management:   On neurontin tid for neuropathy.   Continue to use oxycodone prn for pain. 4. Mood: Team to provide ego support and education. LCSW to follow for evaluation and support.  5. Neuropsych: This patient is capable of making decisions on her own behalf. 6. Skin/Wound Care: Routine pressure relief measures 7. Fluids/Electrolytes/Nutrition: Monitor I/O. Encouraged patient to push po fluids.  8. Autoimmune hepatitis:   On imuran daily.   Continues to have intermittent nausea  with poor po intake.  9. Anxiety disorder: Was started on Lexapro a week PTA.   - xanax 0.5 BID and 1mg  qHS  - Cont to monitor 10. HTN: monitor BP bid. Continue Cozaar daily.  11. Dehydration: Encouraging PO fluids.   -Improving   12. DM type 2: Monitor BS ac/hs. Resumed metformin.     -Fasting CBGs 136 today  -Will cont to monitor 13. Chronic UTIs: currently On trimpex for prophylaxis.  - Bactrim completed 14. Constipation: Resolved 15. ?vertigo:   prn meclizine.    -vestibular evaluation by therapy to better determine etiology and potential treatment.   -enouraged increased fluid intake  LOS (Days) 12 A FACE TO FACE EVALUATION WAS PERFORMED  Gabriela Rice Phenix 09/04/2015 9:27 AM

## 2015-09-04 NOTE — Progress Notes (Signed)
Occupational Therapy Session Note  Patient Details  Name: Gabriela Rice MRN: [id number] Date of Birth: [birth year]  Today's Date: 09/04/2015 OT Individual Time: 4975-3005 OT Individual Time Calculation (min): 60 min    Short Term Goals: Week 1:  OT Short Term Goal 1 (Week 1): Pt will complete toilet transfer with supervision OT Short Term Goal 1 - Progress (Week 1): Progressing toward goal OT Short Term Goal 2 (Week 1): Pt will dress upper body with min assist OT Short Term Goal 2 - Progress (Week 1): Met OT Short Term Goal 3 (Week 1): Pt will dress lower body with mod assist OT Short Term Goal 3 - Progress (Week 1): Progressing toward goal OT Short Term Goal 4 (Week 1): Pt will maintain supported standing balance with steadying assist during assisted lower body dressing OT Short Term Goal 4 - Progress (Week 1): Met OT Short Term Goal 5 (Week 1): Pt will complete HEP to improve grip/pinch strength with supervision OT Short Term Goal 5 - Progress (Week 1): Partly met Week 2:  OT Short Term Goal 1 (Week 2): STG=LTG due to short LOS  Skilled Therapeutic Interventions/Progress Updates:    Pt engaged in BADL retraining including bathing at shower level and dressing with sit<>stand from w/c at sink.  Pt amb with Rollator into bathroom and transferred to tub bench for bathing tasks.  Pt stated that the shower was tiring and her "legs were weaker" and therefore requested to use w/c to return to room to complete dressing tasks.  Pt used AE (reacher and sock aide) appropriately to assist with LB dressing tasks.  Pt required more than a reasonable amount of time to complete bathing and dressing tasks with multiple rest breaks.  Pt transitioned to gym and engaged in BUE therex with weighted balls to increase BUE strength and overall activity tolerance.  Pt unable to lift legs at end of session and requested to return to bed to rest before next therapy session.  Pt performed SPT with steady A and was  able to reposition in bed with use of bed rails.  Focus on activity tolerance, BADL retraining, sit<>stand, functional amb with Rollator, standing balance, BUE therex, and safety awareness to increase independence with BADLs.  Therapy Documentation Precautions:  Precautions Precautions: Fall Precaution Comments: R ankle may invert; B leg weakness Restrictions Weight Bearing Restrictions: No Pain:  Pt c/o increased discomfort in lower back as session progressed; repositioned  See Function Navigator for Current Functional Status.   Therapy/Group: Individual Therapy  Leroy Libman 09/04/2015, 9:37 AM

## 2015-09-04 NOTE — Progress Notes (Signed)
Physical Therapy Session Note  Patient Details  Name: Gabriela Rice MRN: [id number] Date of Birth: December 09, 1955  Today's Date: 09/04/2015 PT Individual Time: 7543-6067 PT Individual Time Calculation (min): 56 min   Short Term Goals: Week 2:  PT Short Term Goal 1 (Week 2): STGs = LTGs due to ELOS  Skilled Therapeutic Interventions/Progress Updates:    Pt received resting in bed and agreeable to therapy session.  Session focus on LE strengthening and pt education on energy conservation.  Pt propelled w/c to/from therapy gym for UE strengthening and overall endurance.  PT instructed patient on Kinetron x15 min total with 1 min rest breaks every 45-75 seconds.  PT provided patient education on allowing enough time during rest breaks to recover in order to maximize benefit from exercise.  Pt verbalized understanding, education to continue throughout stay.  PT instructed patient in BLE therex in sitting and supine.  Sitting exercises x10 reps heel/toe raises, LAQ, R hip flexion, pillow squeezes, and hip abd on R and L individually with level 2 theraband.  Supine exercises 2x5 reps AAROM with focus on eccentric control for heel slides, hip abd/add, bridging, and SLR.  PT instructed patient in trunk rotation stretch x5 to R and L.  Pt transfers throughout session with RW stand/pivot and supervision for safety.  Pt returned to bed at end of session and positioned in supine with call bell in reach and needs met.   Therapy Documentation Precautions:  Precautions Precautions: Fall Precaution Comments: R ankle may invert; B leg weakness Restrictions Weight Bearing Restrictions: No Pain: Pain Assessment Pain Assessment: No/denies pain   See Function Navigator for Current Functional Status.   Therapy/Group: Individual Therapy  Earnest Conroy Penven[place]Crew 09/04/2015, 4:46 PM

## 2015-09-04 NOTE — Progress Notes (Signed)
Physical Therapy Note  Patient Details  Name: FATOUMATA BOELTER MRN: [id number] Date of Birth: Jul 08, 1956 Today's Date: 09/04/2015    Time: 1000-1053 53 minutes  1:1 No c/o pain, pt c/o fatigue from shower earlier today.  Pt able to perform bed mobility with bed rails supine to sit with mod I. Min A squat pivot transfer to w/c.  Pt able to propel w/c 150' x 3 with mod I in controlled environment.  Pt states need to use restroom. Gait into bathroom with min A with RW, min A standing balance for clothing negotiation and handwashing.  Gait with RW 40' x 3 with min A, R LE drags when fatigued, requires increased time and assistance to advance R LE when fatigued.  Standing balance tapping and stepping all directions with min A, frequent rest breaks. Gait training up/down ramp as pt's husband plans to build ramp for pt to use at home. Pt able to gait up ramp with RW and min A, coming down ramp pt with increased fatigue, LEs giving out and pt requires total A to prevent fall.  Pt then requires total A to lift LEs onto w/c leg rests and to lift LEs into bed.  Discussed energy conservation and planning her day at home.  Pt verbalizes understanding of energy conservation recommendations.  Pt left in bed with needs at hand.   DONAWERTH,KAREN 09/04/2015, 10:53 AM

## 2015-09-05 ENCOUNTER — Inpatient Hospital Stay (HOSPITAL_COMMUNITY): Payer: BLUE CROSS/BLUE SHIELD | Admitting: *Deleted

## 2015-09-05 ENCOUNTER — Inpatient Hospital Stay (HOSPITAL_COMMUNITY): Payer: BLUE CROSS/BLUE SHIELD | Admitting: Physical Therapy

## 2015-09-05 ENCOUNTER — Inpatient Hospital Stay (HOSPITAL_COMMUNITY): Payer: BLUE CROSS/BLUE SHIELD

## 2015-09-05 DIAGNOSIS — K5900 Constipation, unspecified: Secondary | ICD-10-CM | POA: Insufficient documentation

## 2015-09-05 DIAGNOSIS — K5903 Drug induced constipation: Secondary | ICD-10-CM

## 2015-09-05 DIAGNOSIS — E1149 Type 2 diabetes mellitus with other diabetic neurological complication: Secondary | ICD-10-CM

## 2015-09-05 DIAGNOSIS — E1142 Type 2 diabetes mellitus with diabetic polyneuropathy: Secondary | ICD-10-CM

## 2015-09-05 LAB — GLUCOSE, CAPILLARY
GLUCOSE-CAPILLARY: 108 mg/dL — AB (ref 65–99)
GLUCOSE-CAPILLARY: 95 mg/dL (ref 65–99)
GLUCOSE-CAPILLARY: 125 mg/dL — AB (ref 65–99)
Glucose-Capillary: 119 mg/dL — ABNORMAL HIGH (ref 65–99)

## 2015-09-05 MED ORDER — TRIMETHOPRIM 100 MG PO TABS
100.0000 mg | ORAL_TABLET | Freq: Every day | ORAL | Status: DC
  Administered 2015-09-05 – 2015-09-08 (×4): 100 mg via ORAL
  Filled 2015-09-05 (×5): qty 1

## 2015-09-05 MED ORDER — POLYETHYLENE GLYCOL 3350 17 G PO PACK
17.0000 g | PACK | Freq: Every day | ORAL | Status: DC
  Administered 2015-09-06: 17 g via ORAL
  Filled 2015-09-05 (×3): qty 1

## 2015-09-05 NOTE — Progress Notes (Signed)
Physical Therapy Session Note  Patient Details  Name: Gabriela Rice MRN: [id number] Date of Birth: June 18, 1956  Today's Date: 09/05/2015 PT Individual Time: 1520-1600 PT Individual Time Calculation (min): 40 min   Short Term Goals: Week 2:  PT Short Term Goal 1 (Week 2): STGs = LTGs due to ELOS  Skilled Therapeutic Interventions/Progress Updates:   Session focused on functional mobility training, standing balance, BLE NMR, and activity tolerance. Patient in bed, transferred to edge of bed using rail with HOB slightly elevated and squat pivot transfer to wheelchair with supervision. Patient propelled wheelchair to and from gym with mod I. Gait using RW x 70 ft with steady assist. Standing BLE NMR using RW for UE support: alternating toe taps to 2 inch beam 2 x 10, heel raises x 10, squats 2 x 10. To challenge standing balance/standing tolerance, patient stood using RW to play card game with supervision, trials between 2 min to 15 sec as patient fatigued with seated rest breaks as needed. Patient returned to bed and left semi reclined in bed with all needs within reach.   Therapy Documentation Precautions:  Precautions Precautions: Fall Precaution Comments: R ankle may invert; B leg weakness Restrictions Weight Bearing Restrictions: No Pain:  1/10 low back pain  See Function Navigator for Current Functional Status.   Therapy/Group: Individual Therapy  Laretta Alstrom 09/05/2015, 3:57 PM

## 2015-09-05 NOTE — Progress Notes (Signed)
Occupational Therapy Session Note  Patient Details  Name: Gabriela Rice MRN: [id number] Date of Birth: 1955/12/31  Today's Date: 09/05/2015 OT Individual Time: 0900-1000 OT Individual Time Calculation (min): 60 min   Short Term Goals: Week 2:  OT Short Term Goal 1 (Week 2): Pt will complete grooming standing supported at sink with supervision OT Short Term Goal 2 (Week 2): Pt will complete light housekeeping task seated at w/c level with moderate instructional cues and setup OT Short Term Goal 3 (Week 2): Pt will retreive items from floor and higher surfaces using reacher with supervision OT Short Term Goal 4 (Week 2): Pt will demo ability to don socks using AE as needed with min assist to manage/lift right LE  Skilled Therapeutic Interventions/Progress Updates: ADL-retraining at shower level with focus on improved functional mobility and adapted dressing skills.  Pt received supine in bed and able to perform bed mobility to sit at EOB with min instructional cues to operate bed controls independently.  Pt transferred to RW and ambulated to bathroom to toilet (unproductive for BM).   Pt rose from toilet (without BSC placed over toilet) with mod assist to lift and required only hand guidance to ambulate to shower bench with steadying assisted to transfer to bench safely.   Pt bathed unassisted but required use of w/c to return to sink to dress, after brief rest break.   After showering, pt's right leg becomes much weaker and she attempts to dress but cannot lift leg sufficiently w/o assistance.   Pt able to pull up pants on her left side, using RUE to support at sink but requires assist to manage brief and pull up pants at right side. Pt requested return to bed to recover as MD arrived for f/u assessment at end of session.   All needs placed within reach.    Therapy Documentation Precautions:  Precautions Precautions: Fall Precaution Comments: R ankle may invert; B leg  weakness Restrictions Weight Bearing Restrictions: No  Pain: Pain Assessment Pain Assessment: No/denies pain Pain Score: 0-No pain  See Function Navigator for Current Functional Status.   Therapy/Group: Individual Therapy   Second session: Time: 1300-1330 Time Calculation (min):  30 min  Pain Assessment: No/denies pain  Skilled Therapeutic Interventions: ADL-retraining with focus on dynamic standing balance during grooming task (15 min).   Pt received after she finished her lunch while laying in bed, HOB elevated.   Pt educated on new short-term goals and proceeded with activity planned: grooming at sink.   Pt completed bed mobility unassisted and ambulated to sink with contact guard.  Pt completed oral care standing supported with supervision for safety.   Therapeutic activity (15 min) with focus on performance of light housekeeping task.   Pt reiterated plan to be as mobile as possible s/p d/c and elected to make her bed standing, using contact with bed to maintain stability with steadying assist provided as pt raised her arms to lay out bedspread (d/t mild LOB, posterior).   Pt recovered to bed after treatment with mod assist to lift both legs into bed for her.   See FIM for current functional status  Therapy/Group: Individual Therapy  [place] 09/05/2015, 12:24 PM

## 2015-09-05 NOTE — Progress Notes (Signed)
[facility] PHYSICAL MEDICINE & REHABILITATION     PROGRESS NOTE    Subjective/Complaints: Pt seen and examined this AM.  Pt slept well overnight.  She states her nausea is improved and her vertigo has only happened once and it resolved without medications.  She would like to change her bowel meds.     ROS: Denies CP, SOB, n/v/d.   Objective: Vital Signs: Blood pressure 109/68, pulse 81, temperature 97.9 F (36.6 C), temperature source Oral, resp. rate 16, height 5\' 4"  (1.626 m), weight 86.456 kg (190 lb 9.6 oz), SpO2 96 %. No results found. No results for input(s): WBC, HGB, HCT, PLT in the last 72 hours. No results for input(s): NA, K, CL, GLUCOSE, BUN, CREATININE, CALCIUM in the last 72 hours.  Invalid input(s): CO CBG (last 3)   Recent Labs  09/04/15 1643 09/04/15 2100 09/05/15 0656  GLUCAP 129* 106* 119*    Wt Readings from Last 3 Encounters:  08/23/15 86.456 kg (190 lb 9.6 oz)  08/20/15 90.719 kg (200 lb)  07/13/15 90.719 kg (200 lb)    Physical Exam:  GEN: no distress. Vital signs reviewed.  HENT: oral mucosa moist Head: Normocephalic and atraumatic.  Eyes: Conjunctivae and EOM are normal.   Cardiovascular: Normal rate and regular rhythm. No murmur heard. Respiratory: Effort normal and breath sounds normal. No respiratory distress. She has no wheezes.  GI: Soft. Bowel sounds are normal. She exhibits no distension. There is no tenderness.  Musculoskeletal: She exhibits no edema or tenderness.  Neurological: She is alert and oriented. Speech clear.  Follows commands without difficulty.  Motor: BUE 5/5 proximal to distal RLE hip flexion 2-/5, knee extention 2/5, dorsiflexion 2/5, plantarflexion 2/5  LLE hip flexion 2-/5, knee extention 2/5, dorsiflexion 2/5, plantarflexion 2/5  Extensor tone LLE.   Skin: Skin is warm and dry. She is not diaphoretic.   Psychiatric: She has a normal mood and affect.   Assessment/Plan: 1. Functional deficits secondary to MS  exacerbation with weakness,sensory loss, balance deficits which require 3+ hours per day of interdisciplinary therapy in a comprehensive inpatient rehab setting. Physiatrist is providing close team supervision and 24 hour management of active medical problems listed below. Physiatrist and rehab team continue to assess barriers to discharge/monitor patient progress toward functional and medical goals.  Function:  Bathing Bathing position   Position: Shower  Bathing parts Body parts bathed by patient: Right arm, Left arm, Chest, Abdomen, Front perineal area, Buttocks, Right upper leg, Left lower leg, Right lower leg, Left upper leg Body parts bathed by helper: Back  Bathing assist Assist Level: Supervision or verbal cues      Upper Body Dressing/Undressing Upper body dressing   What is the patient wearing?: Pull over shirt/dress Bra - Perfomed by patient: Thread/unthread right bra strap, Thread/unthread left bra strap, Hook/unhook bra (pull down sports bra)   Pull over shirt/dress - Perfomed by patient: Thread/unthread right sleeve, Thread/unthread left sleeve, Put head through opening, Pull shirt over trunk          Upper body assist Assist Level: More than reasonable time   Set up : To obtain clothing/put away  Lower Body Dressing/Undressing Lower body dressing   What is the patient wearing?: Pants, Non-skid slipper socks     Pants- Performed by patient: Thread/unthread right pants leg, Thread/unthread left pants leg, Pull pants up/down Pants- Performed by helper: Pull pants up/down Non-skid slipper socks- Performed by patient: Don/doff right sock, Don/doff left sock Non-skid slipper socks- Performed by  helper: Don/doff right sock                  Lower body assist Assist for lower body dressing: Touching or steadying assistance (Pt > 75%)      Toileting Toileting Toileting activity did not occur: N/A Toileting steps completed by patient: Adjust clothing prior to  toileting, Performs perineal hygiene, Adjust clothing after toileting Toileting steps completed by helper: Adjust clothing after toileting Toileting Assistive Devices: Grab bar or rail  Toileting assist Assist level: More than reasonable time   Transfers Chair/bed transfer   Chair/bed transfer method: Stand pivot Chair/bed transfer assist level: Supervision or verbal cues Chair/bed transfer assistive device: Armrests, Medical sales representative     Max distance: 140 Assist level: Supervision or verbal cues   Wheelchair   Type: Manual Max wheelchair distance: 150 Assist Level: No help, No cues, assistive device, takes more than reasonable amount of time  Cognition Comprehension Comprehension assist level: Follows complex conversation/direction with no assist  Expression Expression assist level: Expresses complex ideas: With no assist  Social Interaction Social Interaction assist level: Interacts appropriately with others with medication or extra time (anti-anxiety, antidepressant).  Problem Solving Problem solving assist level: Solves complex 90% of the time/cues < 10% of the time  Memory Memory assist level: Complete Independence: No helper   Medical Problem List and Plan: 1. Functional deficits secondary to MS exacerbation with RLE weakness, balance deficits and poor safety awareness.  -Continue CIR 2. DVT Prophylaxis/Anticoagulation: Pharmaceutical: Lovenox 3. Pain Management:   On neurontin tid for neuropathy.   Continue to use oxycodone prn for pain. 4. Mood: Team to provide ego support and education. LCSW to follow for evaluation and support.  5. Neuropsych: This patient is capable of making decisions on her own behalf. 6. Skin/Wound Care: Routine pressure relief measures 7. Fluids/Electrolytes/Nutrition: Monitor I/O. Encouraged patient to push po fluids.   -Eating 60-100% of meals  -Labs ordered for tomorrow 8. Autoimmune hepatitis:   -On imuran daily.    -Continues to have intermittent nausea with poor po intake.  9. Anxiety disorder: Was started on Lexapro a week PTA.   - xanax 0.5 BID and 1mg  qHS  - Cont to monitor 10. HTN: monitor BP bid. Continue Cozaar daily.  11. Dehydration: Encouraging PO fluids.   -Improving   12. DM type 2: Monitor BS ac/hs. Resumed metformin.     -Fasting CBGs 119 today  -Will cont to monitor 13. Chronic UTIs: currently On trimpex for prophylaxis.  - Bactrim completed 14. Constipation: Resolved 15. ?vertigo:   -prn meclizine.    -vestibular evaluation by therapy to better determine etiology and potential treatment.   -enouraged increased fluid intake  LOS (Days) 13 A FACE TO FACE EVALUATION WAS PERFORMED  Edmar Blankenburg Lorie Phenix 09/05/2015 10:07 AM

## 2015-09-05 NOTE — Progress Notes (Signed)
Physical Therapy Session Note  Patient Details  Name: Gabriela Rice MRN: [id number] Date of Birth: 15-Feb-1956  Today's Date: 09/05/2015 PT Individual Time: 1135-1205 PT Individual Time Calculation (min): 30 min   Short Term Goals: Week 2:  PT Short Term Goal 1 (Week 2): STGs = LTGs due to ELOS  Skilled Therapeutic Interventions/Progress Updates:   Bed mobility rolling and sitting up, using bed features.  Sit>stand from bed with supervision, VCs for safety as she reached for RW with L hand a little early.  Gait including 4 turns x 55' with min guard assist, RW; rest x 2 minutes seated; gait x 45' straight path, RW. Pt able to predict when bil LE weakness limits gait safety; no buckling noted.  Standing with bil UE support on rail, for mini squats x 8, alternating R/L heel raises x 8, alternating toe raises R/L x 10.  Pt relies heavily on bil LEs; seated rest break between each ex.    W/c> bed using bed rail with modified independence. Pt requested getting back to bed to relieve back pain; left resting in bed, with all needs within reach. Therapy Documentation Precautions:  Precautions Precautions: Fall Precaution Comments: R ankle may invert; B leg weakness Restrictions Weight Bearing Restrictions: No   Pain: "my back is hurting a little bit", rated 3/10; refused pain meds Pain Assessment Pain Assessment: No/denies pain Pain Score: 0-No pain       See Function Navigator for Current Functional Status.   Therapy/Group: Individual Therapy  Rhodes Calvert 09/05/2015, 12:33 PM

## 2015-09-05 NOTE — Progress Notes (Signed)
Recreational Therapy Session Note  Patient Details  Name: Gabriela Rice MRN: [id number] Date of Birth: 11-24-1955 Today's Date: 09/05/2015  Pain: no c/o pain  Skilled Therapeutic Interventions/Progress Updates: Pt with c/o fatigue from bathing and dressing session.  Pt agreeable to get out of bed into w/c for out & about activity.  Pt performed squat pivot transfers with min assist.  Pt appreciative for the change in scenery as she reports it's increases mood & motivation.  Therapy/Group: Individual Therapy  [name] 09/05/2015, 12:10 PM

## 2015-09-06 ENCOUNTER — Inpatient Hospital Stay (HOSPITAL_COMMUNITY): Payer: BLUE CROSS/BLUE SHIELD | Admitting: Physical Therapy

## 2015-09-06 ENCOUNTER — Inpatient Hospital Stay (HOSPITAL_COMMUNITY): Payer: BLUE CROSS/BLUE SHIELD | Admitting: Occupational Therapy

## 2015-09-06 DIAGNOSIS — K5901 Slow transit constipation: Secondary | ICD-10-CM

## 2015-09-06 DIAGNOSIS — H8113 Benign paroxysmal vertigo, bilateral: Secondary | ICD-10-CM

## 2015-09-06 DIAGNOSIS — H8110 Benign paroxysmal vertigo, unspecified ear: Secondary | ICD-10-CM

## 2015-09-06 LAB — CBC WITH DIFFERENTIAL/PLATELET
BASOS PCT: 0 %
Basophils Absolute: 0 10*3/uL (ref 0.0–0.1)
EOS ABS: 0.1 10*3/uL (ref 0.0–0.7)
EOS PCT: 2 %
HCT: 34.8 % — ABNORMAL LOW (ref 36.0–46.0)
HEMOGLOBIN: 11.4 g/dL — AB (ref 12.0–15.0)
Lymphocytes Relative: 29 %
Lymphs Abs: 0.9 10*3/uL (ref 0.7–4.0)
MCH: 31.1 pg (ref 26.0–34.0)
MCHC: 32.8 g/dL (ref 30.0–36.0)
MCV: 94.8 fL (ref 78.0–100.0)
MONOS PCT: 8 %
Monocytes Absolute: 0.2 10*3/uL (ref 0.1–1.0)
NEUTROS PCT: 61 %
Neutro Abs: 1.7 10*3/uL (ref 1.7–7.7)
PLATELETS: 199 10*3/uL (ref 150–400)
RBC: 3.67 MIL/uL — ABNORMAL LOW (ref 3.87–5.11)
RDW: 15.2 % (ref 11.5–15.5)
WBC: 2.9 10*3/uL — ABNORMAL LOW (ref 4.0–10.5)

## 2015-09-06 LAB — GLUCOSE, CAPILLARY
GLUCOSE-CAPILLARY: 132 mg/dL — AB (ref 65–99)
Glucose-Capillary: 96 mg/dL (ref 65–99)
Glucose-Capillary: 109 mg/dL — ABNORMAL HIGH (ref 65–99)
Glucose-Capillary: 112 mg/dL — ABNORMAL HIGH (ref 65–99)

## 2015-09-06 LAB — BASIC METABOLIC PANEL
Anion gap: 9 (ref 5–15)
BUN: 15 mg/dL (ref 6–20)
CALCIUM: 9.2 mg/dL (ref 8.9–10.3)
CO2: 30 mmol/L (ref 22–32)
CREATININE: 0.85 mg/dL (ref 0.44–1.00)
Chloride: 103 mmol/L (ref 101–111)
Glucose, Bld: 122 mg/dL — ABNORMAL HIGH (ref 65–99)
Potassium: 3.7 mmol/L (ref 3.5–5.1)
SODIUM: 142 mmol/L (ref 135–145)

## 2015-09-06 NOTE — Patient Care Conference (Signed)
Inpatient RehabilitationTeam Conference and Plan of Care Update Date: 09/06/2015   Time: 2:15 PM    Patient Name: Gabriela Rice      Medical Record Number: [id number]  Date of Birth: 04/11/1956 Sex: Female         Room/Bed: 4W24C/4W24C-01 Payor Info: Payor: [place] / Plan: BCBS OTHER / Product Type: *No Product type* /    Admitting Diagnosis: MS Exacerbation  Admit Date/Time:  08/23/2015  4:50 PM Admission Comments: No comment available   Primary Diagnosis:  Multiple sclerosis exacerbation ([place]) Principal Problem: Multiple sclerosis exacerbation ([place])  Patient Active Problem List   Diagnosis Date Noted  . Benign paroxysmal positional vertigo 09/06/2015  . Constipation   . Weakness of both legs   . Essential hypertension   . Type 2 diabetes mellitus with diabetic neuropathy ([place])   . Morbid obesity ([place])   . Generalized anxiety disorder   . Lumbar spondylosis with myelopathy   . Dehydration   . Chronic UTI   . Multiple sclerosis exacerbation ([place]) 08/20/2015  . Abdominal pain 08/20/2015  . Autoimmune hepatitis ([place]) 08/20/2015  . Diabetes mellitus type 2, controlled ([place]) 08/20/2015  . Hypertension 08/20/2015  . Right-sided Lumbar Facet Syndrome 07/13/2015  . Lumbar spondylosis 07/13/2015  . Chronic low back pain 07/13/2015  . Trigger point with back pain 07/13/2015  . Myofascial pain syndrome 07/13/2015  . Chronic pain 07/13/2015  . Chronic pain syndrome 07/13/2015  . Generalized weakness 07/13/2015  . Right-sided chronic neck pain 07/13/2015  . Cervical spondylosis 07/13/2015  . DDD (degenerative disc disease), cervical 07/13/2015  . Cervical facet syndrome 07/13/2015  . DDD (degenerative disc disease), lumbar 07/13/2015  . Pain of right lower extremity 07/13/2015  . Chronic radicular lumbar pain 07/13/2015  . Non-insulin dependent type 2 diabetes mellitus ([place]) 07/13/2015  . History of hysterectomy 07/13/2015  . History of C-section 07/13/2015  .  DM (diabetes mellitus) ([place]) 07/13/2015  . Hyperlipidemia 07/13/2015  . Obesity 07/13/2015  . Diverticulosis 07/13/2015  . Lumbago 02/02/2014  . Difficulty in walking(719.7) 02/02/2014  . HBP (high blood pressure) 07/11/2013  . Chronic rhinitis 04/08/2013  . Abnormality of gait 02/16/2013  . Elevated liver function tests 02/12/2013  . Abdominal pain, right upper quadrant 02/12/2013  . Nausea alone 02/12/2013  . Dyspnea 02/04/2013  . Hypoxemia, nocturnal only  02/04/2013  . Encounter for long-term (current) use of other medications 10/14/2012  . Multiple sclerosis ([place]) 10/14/2012  . IRRITABLE BOWEL SYNDROME 03/29/2009  . FATTY LIVER DISEASE 03/29/2009  . Nonspecific elevation of levels of transaminase or lactic acid dehydrogenase (LDH) 03/29/2009  . PERSONAL HX COLONIC POLYPS 03/29/2009    Expected Discharge Date: Expected Discharge Date: 09/08/15  Team Members Present: Physician leading conference: Dr. Delice Lesch Social Worker Present: Ovidio Kin, LCSW Nurse Present: Heather Roberts, RN PT Present: Guilford Shi, PT OT Present: Willeen Cass, OT PPS Coordinator present : Daiva Nakayama, RN, CRRN     Current Status/Progress Goal Weekly Team Focus  Medical   MS exacerbation with b/l LE weakness  improve fatigue, anxiety, vertigo  see above`   Bowel/Bladder   Continent of bowel and bladder; occasional episodes of bladder incontinence; LBM 12/5  Mod I  Assess and treat for constipation as needed   Swallow/Nutrition/ Hydration     na        ADL's   Bathing at shower level - supervision; UB dressing setup; LB dressing - Mod assist.   Transfers steadying assist.  Mod I  UB BADL and toileting, Mod A lower body BADL, supervision standing balance and homemaking. (LTG downgraded)  Standing balance, energy conservation, grip/pinch strengthening, light iADL and discharge planning.   Mobility   Supervision transfers and bed mobility except mod A sit to supine, supervision gait  mod I in  sit; supervision gait, min A stairs  B LE strengthening, improving activity tolerance, improving safety with mobility, standing balance, w/c propulsion, progressive gait   Communication     na        Safety/Cognition/ Behavioral Observations    no unsafe behaviors        Pain   C/o lower back pain- hasnt required any pain medication  < 4  Assess and treat for pain q shift and prn   Skin   Skin intact  Mod I  Assess skin q shift and prn      *See Care Plan and progress notes for long and short-term goals.  Barriers to Discharge: fatigue, vertigo     Possible Resolutions to Barriers:  Aggressive CIR, medications to improve vertigo    Discharge Planning/Teaching Needs:  Pt doing better in therapies, wants to be mod/i at discharge      Team Discussion:  Goals supervision/mod/i wheelchair chair level. Ramp to be completed this weekend. Lower back pain managed. Family education Friday early am. Insurance feels doesn't meet criteria to be here longer than Friday.  Revisions to Treatment Plan:  Discharge date moved up to Friday due to insurance coverage    Continued Need for Acute Rehabilitation Level of Care: The patient requires daily medical management by a physician with specialized training in physical medicine and rehabilitation for the following conditions: Daily direction of a multidisciplinary physical rehabilitation program to ensure safe treatment while eliciting the highest outcome that is of practical value to the patient.: Yes Daily medical management of patient stability for increased activity during participation in an intensive rehabilitation regime.: Yes Daily analysis of laboratory values and/or radiology reports with any subsequent need for medication adjustment of medical intervention for : Neurological problems;Other  Kenyetta Fife, Gardiner Rhyme 09/06/2015, 3:25 PM

## 2015-09-06 NOTE — Progress Notes (Signed)
Physical Therapy Session Note  Patient Details  Name: Gabriela Rice MRN: [id number] Date of Birth: 12-09-1955  Today's Date: 09/06/2015 PT Individual Time: 0900-1000 PT Individual Time Calculation (min): 60 min   Short Term Goals: Week 2:  PT Short Term Goal 1 (Week 2): STGs = LTGs due to ELOS  Skilled Therapeutic Interventions/Progress Updates:    Pt received in bed, agreeable to PT session. Therapeutic Activity - pt reports HOB at height used at home and pt uses R bedrail to complete supine to sit eob transfer mod I. Pt demonstrates sit to stand with RW req SBA for safety - shaky walker but managed well by pt, then stand-step transfer bed to w/c with SBA. Pt req mod A for B LEs sit to supine transfer due to fatigue. Gait Training - PT instructs pt in ambulation with RW x 130' req SBA for safety - reduced shuffling of feet compared to prior week with toe off during pre-swing noted, but return to increased shuffling gait with fatigue. Pt wishes to try stairs - PT instructs pt in ascending/descending 2 (6" height) stairs with B hands on R rail in sideways approach req min-mod A for foot placement. Pt reports ramp may be installed by this weekend and PT asks pt to continue keeping her updated so they can practice the correct method for entering/exiting home. Pt is scared to trial ramp today, but agrees to trial ambulating up/down ramp tomorrow with w/c follow for safety. W/C Management - Pt self propels manual w/c mod I 150' on level surface in controlled environment with B UEs. Pt req verbal cues and hand over hand assist to place legrests, but manages brakes mod I. PT takes specific manual w/c measurements and notes pt will req a: 20" wide, 16" deep, hemi-height manual w/c with [employer] w/c cushion and [employer] w/c backrest, swing-away legrests, and desk length armrests. Neuromuscular Reeducation - PT instructs pt in standing B LE NMR activities: heel/toe raises x 5, toe taps on 2" step x 3 reps R  and x 4 reps L before muscular fatigue limits pt's hip flexion, and mini-squats x 10 reps. Pt ended in bed to rest with nurse tech agreeing to help pt get comfortable. Continue per PT POC.   Therapy Documentation Precautions:  Precautions Precautions: Fall Precaution Comments: R ankle may invert; B leg weakness Restrictions Weight Bearing Restrictions: No Pain: Pain Assessment Pain Assessment: 0-10 Pain Type: Chronic pain Pain Location: Back Pain Orientation: Lower Pain Descriptors / Indicators: Aching;Sore Pain Onset: On-going Pain Intervention(s): Rest;Emotional support;Repositioned Multiple Pain Sites: No  See Function Navigator for Current Functional Status.   Therapy/Group: Individual Therapy  Idamae Coccia M 09/06/2015, 9:16 AM

## 2015-09-06 NOTE — Progress Notes (Signed)
Occupational Therapy Session Note  Patient Details  Name: Gabriela Rice MRN: [id number] Date of Birth: 04-03-1956  Today's Date: 09/06/2015 OT Individual Time: 1115-1200 OT Individual Time Calculation (min): 45 min    Short Term Goals: Week 1:  OT Short Term Goal 1 (Week 1): Pt will complete toilet transfer with supervision OT Short Term Goal 1 - Progress (Week 1): Progressing toward goal OT Short Term Goal 2 (Week 1): Pt will dress upper body with min assist OT Short Term Goal 2 - Progress (Week 1): Met OT Short Term Goal 3 (Week 1): Pt will dress lower body with mod assist OT Short Term Goal 3 - Progress (Week 1): Progressing toward goal OT Short Term Goal 4 (Week 1): Pt will maintain supported standing balance with steadying assist during assisted lower body dressing OT Short Term Goal 4 - Progress (Week 1): Met OT Short Term Goal 5 (Week 1): Pt will complete HEP to improve grip/pinch strength with supervision OT Short Term Goal 5 - Progress (Week 1): Partly met Week 2:  OT Short Term Goal 1 (Week 2): Pt will complete grooming standing supported at sink with supervision OT Short Term Goal 2 (Week 2): Pt will complete light housekeeping task using LRAD with steadying assist OT Short Term Goal 3 (Week 2): Pt will retreive items from floor and higher surfaces using reacher with supervision OT Short Term Goal 4 (Week 2): Pt will demo ability to don socks using AE as needed with min assist to manage/lift right LE  Skilled Therapeutic Interventions/Progress Updates:    1:1 self care retraining at shower level with focus on energy conservation and activity tolerance throughout session enabling her to complete all ADL tasks and ability to manage her right LE with out assistance and limiting trunk flexion protecting her back from pain.  Pt performed all transfers at w/c level at supervision level with grab bars/ hand rails (stand pivot). Sit to stands limited with threading underwear and pants  together. Pt I with use of AE.  Pt able to perform all LB dressing with supervision to occasional steadying A. Pt completed grooming in seated position.   Therapy Documentation Precautions:  Precautions Precautions: Fall Precaution Comments: R ankle may invert; B leg weakness Restrictions Weight Bearing Restrictions: No    Pain: 2/10 back pain Other Treatments:    See Function Navigator for Current Functional Status.   Therapy/Group: Individual Therapy  Willeen Cass [facility] 09/06/2015, 11:53 AM

## 2015-09-06 NOTE — Progress Notes (Signed)
[place] PHYSICAL MEDICINE & REHABILITATION     PROGRESS NOTE    Subjective/Complaints: Pt seen and examined this AM laying in bed.  Pt states that she slept fairly overnight due to a call bell going off through a significant part of the night.  Otherwise, she feels she is doing well.    ROS: Denies CP, SOB, n/v/d.   Objective: Vital Signs: Blood pressure 106/62, pulse 75, temperature 97.9 F (36.6 C), temperature source Oral, resp. rate 18, height 5\' 4"  (1.626 m), weight 86.456 kg (190 lb 9.6 oz), SpO2 97 %. No results found.  Recent Labs  09/06/15 0449  WBC 2.9*  HGB 11.4*  HCT 34.8*  PLT 199    Recent Labs  09/06/15 0449  NA 142  K 3.7  CL 103  GLUCOSE 122*  BUN 15  CREATININE 0.85  CALCIUM 9.2   CBG (last 3)   Recent Labs  09/05/15 1640 09/05/15 2200 09/06/15 0708  GLUCAP 95 125* 132*    Wt Readings from Last 3 Encounters:  08/23/15 86.456 kg (190 lb 9.6 oz)  08/20/15 90.719 kg (200 lb)  07/13/15 90.719 kg (200 lb)    Physical Exam:  GEN: no distress. Vital signs reviewed.  HENT: oral mucosa moist Head: Normocephalic and atraumatic.  Eyes: Conjunctivae and EOM are normal.   Cardiovascular: Normal rate and regular rhythm. No murmur heard. Respiratory: Effort normal and breath sounds normal. No respiratory distress. She has no wheezes.  GI: Soft. Bowel sounds are normal. She exhibits no distension. There is no tenderness.  Musculoskeletal: She exhibits no edema or tenderness.  Neurological: She is alert and oriented. Speech clear.  Follows commands without difficulty.  Motor: BUE 5/5 proximal to distal RLE hip flexion 3-/5, knee extention 3/5, dorsiflexion 5/5, plantarflexion 5/5  LLE hip flexion 3-/5, knee extention 3/5, dorsiflexion 5/5, plantarflexion 5/5  Skin: Skin is warm and dry. She is not diaphoretic.   Psychiatric: She has a normal mood and affect.   Assessment/Plan: 1. Functional deficits secondary to MS exacerbation with  weakness,sensory loss, balance deficits which require 3+ hours per day of interdisciplinary therapy in a comprehensive inpatient rehab setting. Physiatrist is providing close team supervision and 24 hour management of active medical problems listed below. Physiatrist and rehab team continue to assess barriers to discharge/monitor patient progress toward functional and medical goals.  Function:  Bathing Bathing position   Position: Shower  Bathing parts Body parts bathed by patient: Right arm, Left arm, Chest, Abdomen, Front perineal area, Buttocks, Right upper leg, Left upper leg, Right lower leg, Left lower leg, Back Body parts bathed by helper: Back  Bathing assist Assist Level: Supervision or verbal cues      Upper Body Dressing/Undressing Upper body dressing   What is the patient wearing?: Pull over shirt/dress Bra - Perfomed by patient: Thread/unthread right bra strap, Thread/unthread left bra strap, Hook/unhook bra (pull down sports bra)   Pull over shirt/dress - Perfomed by patient: Thread/unthread right sleeve, Thread/unthread left sleeve, Put head through opening, Pull shirt over trunk          Upper body assist Assist Level: Set up   Set up : To obtain clothing/put away  Lower Body Dressing/Undressing Lower body dressing   What is the patient wearing?: Pants, Non-skid slipper socks     Pants- Performed by patient: Thread/unthread left pants leg, Pull pants up/down Pants- Performed by helper: Thread/unthread right pants leg, Pull pants up/down Non-skid slipper socks- Performed by patient: Don/doff right  sock, Don/doff left sock Non-skid slipper socks- Performed by helper: Don/doff right sock, Don/doff left sock                  Lower body assist Assist for lower body dressing: Touching or steadying assistance (Pt > 75%)      Toileting Toileting Toileting activity did not occur: N/A Toileting steps completed by patient: Adjust clothing prior to toileting,  Performs perineal hygiene, Adjust clothing after toileting Toileting steps completed by helper: Adjust clothing after toileting Toileting Assistive Devices: Grab bar or rail  Toileting assist Assist level: More than reasonable time   Transfers Chair/bed transfer   Chair/bed transfer method: Squat pivot, Stand pivot Chair/bed transfer assist level: Supervision or verbal cues Chair/bed transfer assistive device: Armrests     Locomotion Ambulation     Max distance: 70 ft Assist level: Touching or steadying assistance (Pt > 75%)   Wheelchair   Type: Manual Max wheelchair distance: 150 Assist Level: No help, No cues, assistive device, takes more than reasonable amount of time  Cognition Comprehension Comprehension assist level: Follows complex conversation/direction with no assist  Expression Expression assist level: Expresses complex ideas: With no assist  Social Interaction Social Interaction assist level: Interacts appropriately with others with medication or extra time (anti-anxiety, antidepressant).  Problem Solving Problem solving assist level: Solves complex problems: Recognizes & self-corrects  Memory Memory assist level: Complete Independence: No helper   Medical Problem List and Plan: 1. Functional deficits secondary to MS exacerbation with RLE weakness, balance deficits and poor safety awareness.  -Continue CIR 2. DVT Prophylaxis/Anticoagulation: Pharmaceutical: Lovenox 3. Pain Management:   -On neurontin tid for neuropathy.   -Continue to use oxycodone prn for pain. 4. Mood: Team to provide ego support and education. LCSW to follow for evaluation and support.  5. Neuropsych: This patient is capable of making decisions on her own behalf. 6. Skin/Wound Care: Routine pressure relief measures 7. Fluids/Electrolytes/Nutrition: Monitor I/O. Encouraged patient to push po fluids.   -Eating 70-100% of meals  -Labs reviewed, stable on 12/7 8. Autoimmune hepatitis:   -On  imuran daily   -Continues to have intermittent nausea with poor po intake.  9. Anxiety disorder: Was started on Lexapro a week PTA.   - xanax 0.5 BID and 1mg  qHS  - Cont to monitor 10. HTN: monitor BP bid. Continue Cozaar daily.  11. Dehydration: Encouraging PO fluids.   -Appears to have resolved 12. DM type 2: Monitor BS ac/hs. Resumed metformin.     -Fasting CBGs 132 today  -Will cont to monitor 13. Chronic UTIs: currently On trimpex for prophylaxis.  - Bactrim completed 14. Constipation: Resolved 15. Vertigo:   -prn meclizine.    -vestibular evaluation by therapy to better determine etiology and potential treatment.   -enouraged increased fluid intake  LOS (Days) 14 A FACE TO FACE EVALUATION WAS PERFORMED  Ankit Lorie Phenix 09/06/2015 9:10 AM

## 2015-09-06 NOTE — Progress Notes (Signed)
Social Work Patient ID: Gabriela Rice, female   DOB: 07/29/1956, 59 y.o.   MRN: [id number] Met with pt to inform team feels comfortable with discharge being moved up to Friday, per insurance decision. Will have husband come in Friday am for family education. Discussed OP versus home health follow up therapies, she does not want OP no preference for home health agencies. Have ordered her wheelchair via [facility]. Pt feels comfortable with the plan.

## 2015-09-06 NOTE — Progress Notes (Signed)
Physical Therapy Vestibular Assessment and Session Note  Patient Details  Name: Gabriela Rice MRN: [id number] Date of Birth: 08-Jul-1956  Today's Date: 09/06/2015 PT Individual Time: 5284-1324 PT Individual Time Calculation (min): 60 min   Short Term Goals: Week 1:  PT Short Term Goal 1 (Week 1): Pt will demonstrates R side lie to sit transfer req mod A.  PT Short Term Goal 1 - Progress (Week 1): Met PT Short Term Goal 2 (Week 1): Pt will transfer consistently with min A.  PT Short Term Goal 2 - Progress (Week 1): Met PT Short Term Goal 3 (Week 1): Pt will ambulate x 60' with RW or 4WW req min A.  PT Short Term Goal 3 - Progress (Week 1): Met PT Short Term Goal 4 (Week 1): Pt will ascend/descend 2 stairs with B rails req min A.  PT Short Term Goal 4 - Progress (Week 1): Progressing toward goal PT Short Term Goal 5 (Week 1): Pt will transfer in/out of car with min A.  PT Short Term Goal 5 - Progress (Week 1): Met  Skilled Therapeutic Interventions/Progress Updates:  Pt received in bed for vestibular evaluation.  Pt reporting spontaneous episodes of true vertigo over the past few months when in bed with rolling or quick head rotation; she also reports intermittent vertigo (less severe) in sitting with quick head rotation but denies any in standing.  She reports the vertigo is short in duration (<1 minute) and subsides when she returns her head to midline or closes her eyes.  Pt reports she has not experienced an episode of vertigo in the past two days but her most severe episode has been since being in the hospital.  Pt reports nausea with episodes but denies emesis, diplopia, blurring of vision, tinnitus, or changes in hearing.  Pt agreeable to vestibular testing; see below.  Glasses: Y for reading  Hearing aids: N   Antivertiginous Medications: Meclizine PRN but has not taken any today  Blood Pressure: Supine 120/62, HR: 82, 98% Sp02   1. Vestibular Assessment                            Gross neck ROM WFL  Eye Alignment WFL  Oculomotor ROM WFL  Spontaneous  Nystagmus (room light and vision occluded) None in room light  Gaze holding nystagmus(room light and vision occluded) None in room light  Smooth pursuit WFL  Saccades Slight overshooting with vertical saccades  Vergence WFL  VOR Cancellation WFL  Pressure Tests (vision occluded) N/T  VOR slow WFL but reporting mild symptoms of dizziness  Head Thrust Test Negative bilaterally  Head Shaking Test (vision occluded) N/T  Dynamic Visual Acuity         N/T  Rt. Hallpike Dix Negative **  Lt. Hallpike Dix Negative **  Rt. Roll Test Negative  Lt. Roll Test  Negative  MSQ Yaw head movements  Cover-Cross Cover (if indicated) N/T  Head-Neck Differentiation Test (if indicated) N/T  **Hallpike-Dix performed in modified position due to patient reporting significant back pain/discomfort in long sitting.  Pt placed in supine on bed with head rotated to L and R and bed placed in trendelenburg to achieve testing position. No symptoms reported in this position and no nystagmus visualized in room light.**  2. Findings:  Unable to reproduce patient's symptoms during evaluation; pt did report mild vertigo with VOR testing.  Patient's report of symptoms most consistent with BPPV that resolves  spontaneously.  Pt appears to have mild motion sensitivity to head movements in yaw plane.    3. Recommendations for Treatment: Use of x 1 viewing in sitting (handout provided for HEP) for habituation to head pitches and rotation (yaw).    4. Education Provided:  Educated on mechanism of BPPV; pt advised to alert HHPT if symptoms return upon D/C and request evaluation by vestibular trained HHPT.    Pt assisted back into supine position at [facility] with mod A and left with all items within reach.  Documentation Precautions:  Precautions Precautions: Fall Precaution Comments: R ankle may invert; B leg weakness Restrictions Weight Bearing  Restrictions: No Vital Signs: Therapy Vitals Temp: 97.7 F (36.5 C) Temp Source: Oral Pulse Rate: 82 Resp: 18 BP: 120/62 mmHg Patient Position (if appropriate): Lying Oxygen Therapy SpO2: 98 % O2 Device: Not Delivered Pain: Pain Assessment Pain Assessment: No/denies pain  See Function Navigator for Current Functional Status.   Therapy/Group: Individual Therapy  Raylene Everts [facility] 09/06/2015, 4:18 PM

## 2015-09-06 NOTE — Progress Notes (Signed)
Social Work Patient ID: Gabriela Rice, female   DOB: 08-11-1956, 59 y.o.   MRN: [id number] Met with pt to discuss spoke with [employer] who feels she has met her goals and is no longer meeting their criteria for rehab/hospitalization. She will give [place] until Friday 12/9 to wrap up family education and discharge plans. Have informed pt of this and will schedule family education for Friday with husband since he can not come in tomorrow due to obligations. Have made team aware and will work toward discharge Friday.

## 2015-09-07 ENCOUNTER — Encounter (HOSPITAL_COMMUNITY): Payer: BLUE CROSS/BLUE SHIELD

## 2015-09-07 ENCOUNTER — Inpatient Hospital Stay (HOSPITAL_COMMUNITY): Payer: BLUE CROSS/BLUE SHIELD | Admitting: Physical Therapy

## 2015-09-07 ENCOUNTER — Inpatient Hospital Stay (HOSPITAL_COMMUNITY): Payer: BLUE CROSS/BLUE SHIELD

## 2015-09-07 ENCOUNTER — Inpatient Hospital Stay (HOSPITAL_COMMUNITY): Payer: BLUE CROSS/BLUE SHIELD | Admitting: Occupational Therapy

## 2015-09-07 LAB — GLUCOSE, CAPILLARY
GLUCOSE-CAPILLARY: 116 mg/dL — AB (ref 65–99)
GLUCOSE-CAPILLARY: 90 mg/dL (ref 65–99)
GLUCOSE-CAPILLARY: 112 mg/dL — AB (ref 65–99)
Glucose-Capillary: 105 mg/dL — ABNORMAL HIGH (ref 65–99)

## 2015-09-07 MED ORDER — POLYETHYLENE GLYCOL 3350 17 GM/SCOOP PO POWD: ORAL | Status: DC

## 2015-09-07 NOTE — Discharge Summary (Signed)
Physician Discharge Summary  Patient ID: Gabriela Rice MRN: [id number] DOB/AGE: 59-Sep-1957 59 y.o.  Admit date: 08/23/2015 Discharge date: 09/08/2015  Discharge Diagnoses:  Principal Problem:   Multiple sclerosis exacerbation ([place]) Active Problems:   Weakness of both legs   Essential hypertension   Type 2 diabetes mellitus with diabetic neuropathy (HCC)   Morbid obesity (HCC)   Generalized anxiety disorder   Lumbar spondylosis with myelopathy   Dehydration   Chronic UTI   Constipation   Benign paroxysmal positional vertigo   Discharged Condition: Stable   Labs:  Basic Metabolic Panel:  Recent Labs Lab 09/06/15 0449  NA 142  K 3.7  CL 103  CO2 30  GLUCOSE 122*  BUN 15  CREATININE 0.85  CALCIUM 9.2    CBC:  Recent Labs Lab 09/06/15 0449  WBC 2.9*  NEUTROABS 1.7  HGB 11.4*  HCT 34.8*  MCV 94.8  PLT 199    CBG:  Recent Labs Lab 09/07/15 0619 09/07/15 1158 09/07/15 1620 09/07/15 2109 09/08/15 0633  GLUCAP 116* 90 105* 112* 124*    Brief HPI:   Gabriela Rice is a 59 y.o. female with history of DM type 2, autoimmune hepatitis, morbid obesity, anxiety disorder, lumbar spondylosis, MS who was admitted on 11/20 with three day history of increase in BLE weakness, frequent falls and progressive gait problems as well as worsening of abdominal pain. She was started on IV solumedrol for MS exacerbation and CT abdomen/pelvis was negative for diverticulitis or acute abnormalities. Patient declined MRI but was agreeable for CT lumbar spine which revealed left paracentral and foraminal disc extrusion L2/3 likely below L2 nerve root and no spinal stenosis. She completes IV steroids today with recommendations to start prednisone taper tomorrow. Therapy evaluations done and patient continues to be limited by RLE weakness, balance deficits and requires cues for safety. CIR recommended for follow up therapy.    Hospital Course: [name] was admitted to  rehab 08/23/2015 for inpatient therapies to consist of PT and OT at least three hours five days a week. Past admission physiatrist, therapy team and rehab RN have worked together to provide customized collaborative inpatient rehab. She was treated with additional day of IV steroids to complete her treatment course. Blood sugars have been monitored on ac/hs basis and have shown steady improvement once off steroids. She has had complaints of urgency with incontinence and was found to have Enterococcus UTI. She was treated with septra DS X 3 days and trimpex has been resumed.  Mood has been relatively stable on Lexapro and her anxietyhas been controlled on home dose Xanax.  She continues to have intermittent nausea but po intake has been good.   Neurontin has been effective in management of neuropathic symptoms. She has had issues with constipation and Miralax was scheduled to help manage symptoms.  Blood pressures have been monitored on bid basis and have been controlled on Cozaar. Lytes have been monitored during her rehab stay and acute renal failure has resolved. CBC showed some drop in H/H with WBC down to 2.9. ANC 1.7.  Drop in WBC likely due to SE of recent septra DS use. Patient has been afebrile and is to have CBC rechecked by [facility] on 12/12 with results to Dr. Inda Merlin.   Vestibular evaluation revealed mild vertigo with VOR testing and symptoms most consistent with BPPV with mild motion sensitivity. She was educated compensatory stratergies and HEP was given for habituation exercises.  She has made steady progress and modified  independent at wheelchair level and requires supervision with mobility. She will continue to receive follow up [place] by [facility] after discharge.     Rehab course: During patient's stay in rehab weekly team conferences were held to monitor patient's progress, set goals and discuss barriers to discharge. At admission, patient required min assist for squat  pivot transfers and  ADL tasks and total assist with mobility. She has had improvement in activity tolerance, balance, postural control, as well as ability to compensate for deficits. She is able to perform bed to chair and car transfers independently. She is able to ambulate 74' with RW and supervision. Family education was done with regarding all aspects of mobility as well as safety.    Disposition: Home  Diet: Diabetic   Special Instructions: 1. Ambulate with supervision. 2. HHRN to draw CBC on 12/12 with results to Dr. Inda Merlin.      Discharge Instructions    Ambulatory referral to Physical Medicine Rehab    Complete by:  As directed   Needs follow up appointment in 4 weeks/MS flare            Medication List    STOP taking these medications        oxyCODONE-acetaminophen 5-325 MG tablet  Commonly known as:  PERCOCET/ROXICET     predniSONE 10 MG tablet  Commonly known as:  DELTASONE      TAKE these medications        ALPRAZolam 1 MG tablet  Commonly known as:  XANAX  Take 1 tablet (1 mg total) by mouth at bedtime.     aspirin EC 81 MG tablet  Take 81 mg by mouth daily.     azaTHIOprine 50 MG tablet  Commonly known as:  IMURAN  Take 3 tab at the same time daily.     cetirizine 10 MG tablet  Commonly known as:  ZYRTEC  Take 10 mg by mouth daily.     COPAXONE 40 MG/ML Sosy  Generic drug:  Glatiramer Acetate  INJECT 40 MG (1 ML) UNDER THE SKIN THREE TIMES A WEEK     escitalopram 10 MG tablet  Commonly known as:  LEXAPRO  Take 10 mg by mouth daily.     gabapentin 300 MG capsule  Commonly known as:  NEURONTIN  Take 1 capsule (300 mg total) by mouth 3 (three) times daily.     ibuprofen 200 MG tablet  Commonly known as:  ADVIL,MOTRIN  Take 200 mg by mouth every 6 (six) hours as needed for pain.     losartan 100 MG tablet  Commonly known as:  COZAAR  Take 100 mg by mouth daily.     metFORMIN 500 MG tablet  Commonly known as:  GLUCOPHAGE  Take 500 mg by  mouth 2 (two) times daily with a meal.     ondansetron 4 MG tablet  Commonly known as:  ZOFRAN  take 1 tablet every 8 hours if needed for nausea     pantoprazole 40 MG tablet  Commonly known as:  PROTONIX  Take 1 tablet (40 mg total) by mouth daily.     polyethylene glycol powder powder  Commonly known as:  GLYCOLAX/MIRALAX  Take one scoop in 8 oz of water daily     pravastatin 40 MG tablet  Commonly known as:  PRAVACHOL  Take 40 mg by mouth daily.     trimethoprim 100 MG tablet  Commonly known as:  TRIMPEX  Take 100 mg  by mouth daily.     Vitamin D 2000 UNITS Caps  Take 2,000 Units by mouth daily.       Follow-up Information    Follow up with Ankit Lorie Phenix, MD.   Specialty:  Physical Medicine and Rehabilitation   Why:  office will call you with follow up appointment   Contact information:   [place] [address] [phone]       Follow up with Marjorie Smolder, MD On 09/21/2015.   Specialty:  Family Medicine   Why:  appt @ 2:30 pm   Contact information:   [place]  [id number] [phone]       Signed: Bary Leriche 09/08/2015, 9:38 AM

## 2015-09-07 NOTE — Progress Notes (Signed)
Physical Therapy Discharge Summary  Patient Details  Name: Gabriela Rice MRN: [id number] Date of Birth: 1956-03-30  Today's Date: 09/07/2015 PT Individual Time: 1445-1530 PT Individual Time Calculation (min): 45 min    Patient has met 9 of 11 long term goals due to improved activity tolerance, improved balance, increased strength, decreased pain, ability to compensate for deficits, functional use of  right lower extremity and left lower extremity and improved coordination.  Patient to discharge at a wheelchair level Modified Independent.   Patient's care partner is independent to provide the necessary physical assistance at discharge.  Reasons goals not met: Pt unable to stand from low car (TranSit simulation) mod I - req small boost to achieve standing.   Recommendation:  Patient will benefit from ongoing skilled PT services in home health setting to continue to advance safe functional mobility, address ongoing impairments in low activity tolerance, difficulty with ambulation and stairs, B LE weakness, and minimize fall risk.  Equipment: 20x16 manual w/c and RW  Reasons for discharge: discharge from hospital  Patient/family agrees with progress made and goals achieved: Yes  Therapeutic Intervention (12/8): Pt received in bed, c/o significant fatigue today and unsure of what she will be able to do with PT. Therapeutic Activity - see below for details, pt is able to complete all bed mobility mod I, except for sit to supine transfer, even when given a leg lifter, due to fatigue and req mod A for B LEs into bed. Pt completes all transfers with RW to/from: bed, w/c, furniture (easy chair with armrests) mod I. Gait Training - PT instructs pt in ambulation with RW x 60' req Supervision - shuffle step-through gait with minimal ankle DF and hip/knee flexion, but no evidence of buckling as pt has good awareness of when she needs to take a seated rest break. W/C Management - pt continues to require  assist with legrest management, but demonstrates ability to self propel manual w/c with B UEs x 150' mod I. Pt ended in bed to rest with all needs in reach. PT notified SW that pt did not receive a Museum/gallery curator with w/c equipment and SW agrees to contact w/c vendor. Pt safe to d/c home with husband assist prn.    PT Discharge Precautions/Restrictions Precautions Precautions: Fall Precaution Comments: MS-related fatigue; temperature sensitivity Restrictions Weight Bearing Restrictions: No Pain Pain Assessment Pain Assessment: 0-10 Pain Score: 3  Pain Type: Chronic pain Pain Location: Back Pain Orientation: Lower Pain Descriptors / Indicators: Aching;Sore Pain Onset: On-going Pain Intervention(s): Rest;Emotional support Multiple Pain Sites: No  Cognition Overall Cognitive Status: Within Functional Limits for tasks assessed Arousal/Alertness: Awake/alert Orientation Level: Oriented X4 Attention: Focused;Sustained;Selective Focused Attention: Appears intact Sustained Attention: Appears intact Selective Attention: Appears intact Memory: Appears intact Awareness: Appears intact Problem Solving: Appears intact Safety/Judgment: Appears intact Sensation Sensation Light Touch: Impaired Detail Light Touch Impaired Details: Impaired LLE;Impaired RLE Stereognosis: Not tested Hot/Cold: Not tested Proprioception: Appears Intact Additional Comments: pt reports increased L lateral leg numbness/tingling and increase in B feet Coordination Gross Motor Movements are Fluid and Coordinated: No Fine Motor Movements are Fluid and Coordinated: Not tested Coordination and Movement Description: weakness in B LEs Finger Nose Finger Test: wfl B UEs Motor  Motor Motor: Abnormal tone;Motor impersistence Motor - Discharge Observations: low tone in B legs due to weakness  Mobility Bed Mobility Bed Mobility: Rolling Right;Rolling Left;Right Sidelying to Sit;Sit to Supine Rolling Right: 6:  Modified independent (Device/Increase time);With rail Rolling Left: 6: Modified independent (  Device/Increase time);With rail Right Sidelying to Sit: With rails;HOB elevated;6: Modified independent (Device/Increase time) Sit to Supine: HOB flat;With rail;Other (comment);3: Mod assist (leg lifter) Sit to Supine - Details: Manual facilitation for placement Sit to Supine - Details (indicate cue type and reason): assist for B LEs into bed Transfers Transfers: Yes Sit to Stand: 6: Modified independent (Device/Increase time) Stand to Sit: 6: Modified independent (Device/Increase time) Stand Pivot Transfers: 6: Modified independent (Device/Increase time) Locomotion  Ambulation Ambulation: Yes Ambulation/Gait Assistance: 5: Supervision Ambulation Distance (Feet): 60 Feet Assistive device: Rolling walker Ambulation/Gait Assistance Details: Verbal cues for precautions/safety Gait Gait: Yes Gait Pattern: Impaired Gait Pattern: Wide base of support;Lateral trunk lean to right;Lateral trunk lean to left;Shuffle;Decreased dorsiflexion - right;Decreased dorsiflexion - left;Decreased hip/knee flexion - left;Decreased hip/knee flexion - right;Step-through pattern Gait velocity: decreased Wheelchair Mobility Wheelchair Mobility: Yes Wheelchair Assistance: 6: Modified independent (Device/Increase time) Environmental health practitioner: Both upper extremities Wheelchair Parts Management: Needs assistance Distance: 150'  Pt completes 2 steps with B hands on R rail in side step pattern - req min A to place one foot on/off of step - forward lateral ascent, backward lateral descent.  Trunk/Postural Assessment  Cervical Assessment Cervical Assessment: Within Functional Limits Thoracic Assessment Thoracic Assessment: Within Functional Limits Lumbar Assessment Lumbar Assessment: Within Functional Limits Postural Control Postural Control: Within Functional Limits  Balance Balance Balance Assessed: Yes Static  Sitting Balance Static Sitting - Balance Support: Feet supported;Left upper extremity supported;Right upper extremity supported Static Sitting - Level of Assistance: 6: Modified independent (Device/Increase time) Dynamic Sitting Balance Dynamic Sitting - Balance Support: Left upper extremity supported;Right upper extremity supported;Feet supported Dynamic Sitting - Level of Assistance: 6: Modified independent (Device/Increase time) Static Standing Balance Static Standing - Balance Support: Bilateral upper extremity supported;During functional activity Static Standing - Level of Assistance: 6: Modified independent (Device/Increase time) Dynamic Standing Balance Dynamic Standing - Balance Support: Bilateral upper extremity supported;During functional activity Dynamic Standing - Level of Assistance: 6: Modified independent (Device/Increase time) Extremity Assessment  RUE Assessment RUE Assessment: Within Functional Limits LUE Assessment LUE Assessment: Within Functional Limits RLE Assessment RLE Assessment: Exceptions to [facility] RLE AROM (degrees) Overall AROM Right Lower Extremity: Deficits;Due to decreased strength RLE Overall AROM Comments: hip flexion limited 90%; knee wfl; ankle limited 15% RLE PROM (degrees) Overall PROM Right Lower Extremity: Within functional limits for tasks assessed RLE Strength RLE Overall Strength: Deficits RLE Overall Strength Comments: ankle DF 3-/5, ankle eversion 3-/5, hip flexion 2+/5, hip abduction 2-/5, quads 4+/5, hamstrings 4/5 LLE Assessment LLE Assessment: Exceptions to WFL LLE AROM (degrees) Overall AROM Left Lower Extremity: Deficits;Due to decreased strength LLE Overall AROM Comments: hip flexion limited 85% by weakness; knee and ankle wfl LLE PROM (degrees) Overall PROM Left Lower Extremity: Within functional limits for tasks assessed LLE Strength LLE Overall Strength: Deficits LLE Overall Strength Comments: ankle DF 4/5, ankle eversion 4/5,  hip flexion 2+/5, hip abduction 2+/5, quads 5/5, hamstrings 5/5 LLE Tone LLE Tone: Hypotonic Hypotonic Details: hip and ankle muscles   See Function Navigator for Current Functional Status.  [facility] M 09/08/2015, 9:26 AM

## 2015-09-07 NOTE — Progress Notes (Signed)
[place] PHYSICAL MEDICINE & REHABILITATION     PROGRESS NOTE    Subjective/Complaints: Pt seen and examined this AM sitting up in her wheelchair after her show. She feels that she is not as tired after getting out of the shower than she normally is.  She is nervous and excited about going home tomorrow.  Her vertigo has more or less resolved.   ROS: Denies CP, SOB, n/v/d.   Objective: Vital Signs: Blood pressure 115/65, pulse 90, temperature 98 F (36.7 C), temperature source Oral, resp. rate 17, height 5\' 4"  (1.626 m), weight 86.41 kg (190 lb 8 oz), SpO2 96 %. No results found.  Recent Labs  09/06/15 0449  WBC 2.9*  HGB 11.4*  HCT 34.8*  PLT 199    Recent Labs  09/06/15 0449  NA 142  K 3.7  CL 103  GLUCOSE 122*  BUN 15  CREATININE 0.85  CALCIUM 9.2   CBG (last 3)   Recent Labs  09/06/15 1645 09/06/15 2115 09/07/15 0619  GLUCAP 109* 112* 116*    Wt Readings from Last 3 Encounters:  09/06/15 86.41 kg (190 lb 8 oz)  08/20/15 90.719 kg (200 lb)  07/13/15 90.719 kg (200 lb)    Physical Exam:  GEN: no distress. Vital signs reviewed.  HENT: oral mucosa moist Head: Normocephalic and atraumatic.  Eyes: Conjunctivae and EOM are normal.   Cardiovascular: Normal rate and regular rhythm. No murmur heard. Respiratory: Effort normal and breath sounds normal. No respiratory distress. She has no wheezes.  GI: Soft. Bowel sounds are normal. She exhibits no distension. There is no tenderness.  Musculoskeletal: She exhibits no edema or tenderness.  Neurological: She is alert and oriented. Speech clear.  Follows commands without difficulty.  Motor: BUE 5/5 proximal to distal RLE hip flexion 2/5, knee extention 2+/5, dorsiflexion 3+/5, plantarflexion 3+/5  LLE hip flexion 2/5, knee extention 2+/5, dorsiflexion 3/5, plantarflexion 3+/5  Skin: Skin is warm and dry. She is not diaphoretic.   Psychiatric: She has a normal mood and affect.   Assessment/Plan: 1.  Functional deficits secondary to MS exacerbation with weakness,sensory loss, balance deficits which require 3+ hours per day of interdisciplinary therapy in a comprehensive inpatient rehab setting. Physiatrist is providing close team supervision and 24 hour management of active medical problems listed below. Physiatrist and rehab team continue to assess barriers to discharge/monitor patient progress toward functional and medical goals.  Function:  Bathing Bathing position   Position: Shower  Bathing parts Body parts bathed by patient: Right arm, Left arm, Chest, Abdomen, Front perineal area, Buttocks, Right upper leg, Left upper leg, Right lower leg, Left lower leg, Back Body parts bathed by helper: Back  Bathing assist Assist Level: Supervision or verbal cues      Upper Body Dressing/Undressing Upper body dressing   What is the patient wearing?: Pull over shirt/dress Bra - Perfomed by patient: Thread/unthread right bra strap, Thread/unthread left bra strap, Hook/unhook bra (pull down sports bra)   Pull over shirt/dress - Perfomed by patient: Thread/unthread right sleeve, Thread/unthread left sleeve, Put head through opening, Pull shirt over trunk          Upper body assist Assist Level: Set up   Set up : To obtain clothing/put away  Lower Body Dressing/Undressing Lower body dressing   What is the patient wearing?: Pants, Non-skid slipper socks     Pants- Performed by patient: Thread/unthread left pants leg, Pull pants up/down Pants- Performed by helper: Thread/unthread right pants leg, Pull  pants up/down Non-skid slipper socks- Performed by patient: Don/doff right sock, Don/doff left sock Non-skid slipper socks- Performed by helper: Don/doff right sock, Don/doff left sock                  Lower body assist Assist for lower body dressing: Touching or steadying assistance (Pt > 75%)      Toileting Toileting Toileting activity did not occur: N/A Toileting steps  completed by patient: Adjust clothing prior to toileting, Performs perineal hygiene, Adjust clothing after toileting Toileting steps completed by helper: Adjust clothing after toileting Toileting Assistive Devices: Grab bar or rail  Toileting assist Assist level: More than reasonable time   Transfers Chair/bed transfer   Chair/bed transfer method: Stand pivot Chair/bed transfer assist level: Supervision or verbal cues Chair/bed transfer assistive device: Walker, Air cabin crew     Max distance: 130' Assist level: Supervision or verbal cues   Wheelchair   Type: Manual Max wheelchair distance: 150' Assist Level: No help, No cues, assistive device, takes more than reasonable amount of time  Cognition Comprehension Comprehension assist level: Follows complex conversation/direction with no assist  Expression Expression assist level: Expresses complex ideas: With no assist  Social Interaction Social Interaction assist level: Interacts appropriately with others with medication or extra time (anti-anxiety, antidepressant).  Problem Solving Problem solving assist level: Solves complex problems: Recognizes & self-corrects  Memory Memory assist level: Complete Independence: No helper   Medical Problem List and Plan: 1. Functional deficits secondary to MS exacerbation with RLE weakness, balance deficits and poor safety awareness.  -Continue CIR 2. DVT Prophylaxis/Anticoagulation: Pharmaceutical: Lovenox 3. Pain Management:   -On neurontin tid for neuropathy.   -Continue to use oxycodone prn for pain, however, she rarely uses this and can manage without medications.  4. Mood: Team to provide ego support and education. LCSW to follow for evaluation and support.  5. Neuropsych: This patient is capable of making decisions on her own behalf. 6. Skin/Wound Care: Routine pressure relief measures 7. Fluids/Electrolytes/Nutrition: Monitor I/O. Encouraged patient to push po  fluids.   -Eating 50-100% of meals  -Labs reviewed, stable on 12/7 8. Autoimmune hepatitis:   -On imuran daily   -Continues to have intermittent nausea with poor po intake.  9. Anxiety disorder: Was started on Lexapro a week PTA.   - xanax 0.5 BID and 1mg  qHS  - Cont to monitor  - Controlled 10. HTN: monitor BP bid. Continue Cozaar daily.  11. Dehydration: Encouraging PO fluids.   -Resolved 12. DM type 2: Monitor BS ac/hs. Resumed metformin.     -Fasting CBGs 116 today  -Will cont to monitor 13. Chronic UTIs: currently On trimpex for prophylaxis.  - Bactrim completed 14. Constipation: Resolved 15. Vertigo:   -prn meclizine.    -vestibular evaluation by therapy to better determine etiology and potential treatment.   -enouraged increased fluid intake  -Resolved  LOS (Days) 15 A FACE TO FACE EVALUATION WAS PERFORMED  Ankit Lorie Phenix 09/07/2015 8:53 AM

## 2015-09-07 NOTE — Progress Notes (Signed)
Occupational Therapy Session Note  Patient Details  Name: Gabriela Rice MRN: [id number] Date of Birth: [birth year]  Today's Date: 09/07/2015 OT Individual Time: 1115-1200 OT Individual Time Calculation (min): 45 min    Short Term Goals: Week 2:  OT Short Term Goal 1 (Week 2): Pt will complete grooming standing supported at sink with supervision OT Short Term Goal 2 (Week 2): Pt will complete light housekeeping task using LRAD with steadying assist OT Short Term Goal 3 (Week 2): Pt will retreive items from floor and higher surfaces using reacher with supervision OT Short Term Goal 4 (Week 2): Pt will demo ability to don socks using AE as needed with min assist to manage/lift right LE  Skilled Therapeutic Interventions/Progress Updates:    1:1 Pt had received new w/c; checked new equipment and trialed pt in chair performing w/c mobility and transfers. Pt had not received back that was ordered- communicated to team member to correct.  Pt performed bed mobility to come to EOB and into w/c mod I.  Focus on activity tolerance and UB strengthening while propelling w/c throughout hospital. Once returned to room focused on mod I transfer back to bed but required mod A to get both LEs back into the bed.   Therapy Documentation Precautions:  Precautions Precautions: Fall Precaution Comments: R ankle may invert; B leg weakness Restrictions Weight Bearing Restrictions: No Pain: No c/o pain in session  See Function Navigator for Current Functional Status.   Therapy/Group: Individual Therapy  Willeen Cass [facility] 09/07/2015, 2:40 PM

## 2015-09-07 NOTE — Plan of Care (Signed)
Problem: RH Bed Mobility Goal: LTG Patient will perform bed mobility with assist (PT) LTG: Patient will perform bed mobility with assistance, with/without cues (PT).  Outcome: Not Met (add Reason) Pt req mod A for B LEs into bed - otherwise, pt is mod I in w/c.

## 2015-09-07 NOTE — Progress Notes (Signed)
Occupational Therapy Session Note  Patient Details  Name: Gabriela Rice MRN: [id number] Date of Birth: Jan 14, 1956  Today's Date: 09/07/2015 OT Individual Time: [id number] Individual Time Calculation (min): 60 min   Short Term Goals: Week 2:  OT Short Term Goal 1 (Week 2): Pt will complete grooming standing supported at sink with supervision OT Short Term Goal 2 (Week 2): Pt will complete light housekeeping task using LRAD with steadying assist OT Short Term Goal 3 (Week 2): Pt will retreive items from floor and higher surfaces using reacher with supervision OT Short Term Goal 4 (Week 2): Pt will demo ability to don socks using AE as needed with min assist to manage/lift right LE  Skilled Therapeutic Interventions/Progress Updates: ADL-retraining with focus on energy conservation, functional mobility, transfers, and dynamic standing balance.   Pt received supine in bed awaiting therapist and confirming new d/c date set for 12/9 (versus 12/15).   OT educated pt on "graduation day" requirements with OT providing only necessary assist.   Pt rises to EOB using bed rail (HOB lowered to simulate pt's bed at home).   Pt uses walker to ambulate to bathroom with standby assist and transfers to toilet to toilet prior to bathing.  Pt is unproductive for urine or BM.   Pt rises from low toilet unassisted and ambulates to shower.   Pt bathes, seated on bench and stands to wash periarea and buttocks.   Pt reports fatigue after bathing and requests w/c to dress at sink.  Pt requires rest break to recover but relies on OT to manage her right leg and reposition w/c.   Pt grooms unassisted and dresses upper body but continues to require physical assist to lace brief and pants on her right side.  Pt attempts use of reacher but remains unskilled with device for dressing tasks, and elects to dons briefs and pants on her left side by leaning forward and reaching.   Pt is able to stand supported at sink to pull up the  left side but again relies on therapist to pull up garments over right hip.   Pt reports fatigue from session and requests return to bed to rest prior to PT session.   Pt completes stand-pivot transfer unassisted but requires assist to bring both legs back into bed at end of session.    Therapy Documentation Precautions:  Precautions Precautions: Fall Precaution Comments: R ankle may invert; B leg weakness Restrictions Weight Bearing Restrictions: No  Vital Signs: Therapy Vitals Temp: 98.2 F (36.8 C) Temp Source: Oral Pulse Rate: 84 Resp: 18 BP: 117/67 mmHg Patient Position (if appropriate): Lying Oxygen Therapy SpO2: 96 % O2 Device: Not Delivered   Pain: 5/10, lower back and right hip, rest, shower provided    See Function Navigator for Current Functional Status.   Therapy/Group: Individual Therapy  Boyde Grieco 09/07/2015, 1:55 PM

## 2015-09-08 ENCOUNTER — Inpatient Hospital Stay (HOSPITAL_COMMUNITY): Payer: BLUE CROSS/BLUE SHIELD | Admitting: Physical Therapy

## 2015-09-08 LAB — GLUCOSE, CAPILLARY: GLUCOSE-CAPILLARY: 124 mg/dL — AB (ref 65–99)

## 2015-09-08 NOTE — Plan of Care (Signed)
Problem: RH Car Transfers Goal: LTG Patient will perform car transfers with assist (PT) LTG: Patient will perform car transfers with assistance (PT).  Outcome: Not Met (add Reason) Pt req min A to stand from low car.

## 2015-09-08 NOTE — Progress Notes (Signed)
Physical Therapy Session Note  Patient Details  Name: Gabriela Rice MRN: [id number] Date of Birth: Jan 02, 1956  Today's Date: 09/08/2015 PT Individual Time: 0830-0915 PT Individual Time Calculation (min): 45 min   Short Term Goals: Week 2:  PT Short Term Goal 1 (Week 2): STGs = LTGs due to ELOS  Skilled Therapeutic Interventions/Progress Updates:    Pt received in bed with husband present for family training. Therapeutic Activity - Pt transfers mod I from bed to easy chair with RW, then dresses upper and lower body with set up assist. Pt completes car transfer from w/c with RW mod I with increased time, self managing legs. TranSit set at sedan height and husband demonstrates ability to give pt min A to stand from low car with RW and pt step transfers back to w/c with SBA once up. PT has pt pick up pen from floor in stand using RW for support and reacher for AE req SBA. Gait Training - PT instructs pt in how to assist pt up/down 2 steps with B hands on R rail - pt req min A to place only one foot on or off of step and husband demonstrates understanding in ability to assist pt. PT instructs pt in ambulating x 10' over compliant floor mat with RW req SBA for safety. Pt ended up in w/c in room - husband present to assist with packing.   Therapy Documentation Precautions:  Precautions Precautions: Fall Precaution Comments: MS-related fatigue; temperature sensitivity Restrictions Weight Bearing Restrictions: No Pain: Pain Assessment Pain Assessment: No/denies pain   See Function Navigator for Current Functional Status.   Therapy/Group: Individual Therapy  [place] Festa M 09/08/2015, 8:38 AM

## 2015-09-08 NOTE — Progress Notes (Signed)
[name] PHYSICAL MEDICINE & REHABILITATION     PROGRESS NOTE    Subjective/Complaints: Pt seen and examined this AM sitting up in her wheelchair. Pt slept well overnight and is ready to go home.  She does not have any complaints this AM.    ROS: Denies CP, SOB, n/v/d.   Objective: Vital Signs: Blood pressure 113/66, pulse 92, temperature 98.9 F (37.2 C), temperature source Oral, resp. rate 18, height 5\' 4"  (1.626 m), weight 86.41 kg (190 lb 8 oz), SpO2 95 %. No results found.  Recent Labs  09/06/15 0449  WBC 2.9*  HGB 11.4*  HCT 34.8*  PLT 199    Recent Labs  09/06/15 0449  NA 142  K 3.7  CL 103  GLUCOSE 122*  BUN 15  CREATININE 0.85  CALCIUM 9.2   CBG (last 3)   Recent Labs  09/07/15 1620 09/07/15 2109 09/08/15 0633  GLUCAP 105* 112* 124*    Wt Readings from Last 3 Encounters:  09/06/15 86.41 kg (190 lb 8 oz)  08/20/15 90.719 kg (200 lb)  07/13/15 90.719 kg (200 lb)    Physical Exam:  GEN: no distress. Vital signs reviewed.  HENT: oral mucosa moist Head: Normocephalic and atraumatic.  Eyes: Conjunctivae and EOM are normal.   Cardiovascular: Normal rate and regular rhythm. No murmur heard. Respiratory: Effort normal and breath sounds normal. No respiratory distress. She has no wheezes.  GI: Soft. Bowel sounds are normal. She exhibits no distension. There is no tenderness.  Musculoskeletal: She exhibits no edema or tenderness.  Neurological: She is alert and oriented. Speech clear.  Follows commands without difficulty.  Motor: BUE 5/5 proximal to distal RLE hip flexion 2+/5, knee extention 2+/5, dorsiflexion 3+/5, plantarflexion 4/5  LLE hip flexion 2+/5, knee extention 2+/5, dorsiflexion 4+/5, plantarflexion 4/5  Skin: Skin is warm and dry. She is not diaphoretic.   Psychiatric: She has a normal mood and affect.   Assessment/Plan: 1. Functional deficits secondary to MS exacerbation with weakness,sensory loss, balance deficits which  require 3+ hours per day of interdisciplinary therapy in a comprehensive inpatient rehab setting. Physiatrist is providing close team supervision and 24 hour management of active medical problems listed below. Physiatrist and rehab team continue to assess barriers to discharge/monitor patient progress toward functional and medical goals.  Function:  Bathing Bathing position   Position: Shower  Bathing parts Body parts bathed by patient: Right arm, Left arm, Chest, Abdomen, Front perineal area, Buttocks, Right upper leg, Left upper leg, Right lower leg, Left lower leg, Back Body parts bathed by helper: Back  Bathing assist Assist Level: Assistive device Assistive Device Comment: LH sponge    Upper Body Dressing/Undressing Upper body dressing   What is the patient wearing?: Pull over shirt/dress Bra - Perfomed by patient: Thread/unthread right bra strap, Thread/unthread left bra strap, Hook/unhook bra (pull down sports bra)   Pull over shirt/dress - Perfomed by patient: Thread/unthread right sleeve, Thread/unthread left sleeve, Put head through opening, Pull shirt over trunk          Upper body assist Assist Level: Set up   Set up : To obtain clothing/put away  Lower Body Dressing/Undressing Lower body dressing   What is the patient wearing?: Pants Underwear - Performed by patient: Thread/unthread left underwear leg, Pull underwear up/down Underwear - Performed by helper: Thread/unthread right underwear leg, Pull underwear up/down Pants- Performed by patient: Thread/unthread left pants leg, Pull pants up/down, Thread/unthread right pants leg Pants- Performed by helper: Thread/unthread right  pants leg, Fasten/unfasten pants Non-skid slipper socks- Performed by patient: Don/doff right sock, Don/doff left sock Non-skid slipper socks- Performed by helper: Don/doff right sock, Don/doff left sock                  Lower body assist Assist for lower body dressing: Set up   Set up  : To obtain clothing/put away  Toileting Toileting Toileting activity did not occur: N/A Toileting steps completed by patient: Adjust clothing prior to toileting, Performs perineal hygiene, Adjust clothing after toileting Toileting steps completed by helper: Adjust clothing after toileting Toileting Assistive Devices: Grab bar or rail  Toileting assist Assist level: More than reasonable time   Transfers Chair/bed transfer   Chair/bed transfer method: Stand pivot, Ambulatory Chair/bed transfer assist level: No Help, no cues, assistive device, takes more than a reasonable amount of time Chair/bed transfer assistive device: Walker, Air cabin crew     Max distance: 60' Assist level: Supervision or verbal cues   Wheelchair   Type: Manual Max wheelchair distance: 150' Assist Level: No help, No cues, assistive device, takes more than reasonable amount of time  Cognition Comprehension Comprehension assist level: Follows complex conversation/direction with extra time/assistive device  Expression Expression assist level: Expresses complex ideas: With extra time/assistive device  Social Interaction Social Interaction assist level: Interacts appropriately with others with medication or extra time (anti-anxiety, antidepressant).  Problem Solving Problem solving assist level: Solves complex problems: Recognizes & self-corrects  Memory Memory assist level: Complete Independence: No helper   Medical Problem List and Plan: 1. Functional deficits secondary to MS exacerbation with RLE weakness, balance deficits and poor safety awareness.  -D/c today 2. DVT Prophylaxis/Anticoagulation: Pharmaceutical: Lovenox 3. Pain Management:   -On neurontin tid for neuropathy.   -Continue to use oxycodone prn for pain, however, she rarely uses this and can manage without medications.  4. Mood: Team to provide ego support and education. LCSW to follow for evaluation and support.  5.  Neuropsych: This patient is capable of making decisions on her own behalf. 6. Skin/Wound Care: Routine pressure relief measures 7. Fluids/Electrolytes/Nutrition: Monitor I/O. Encouraged patient to push po fluids.   -Eating 60-100% of meals  -Labs reviewed, stable on 12/7 8. Autoimmune hepatitis:   -On imuran daily   -Continues to have intermittent nausea with poor po intake.  9. Anxiety disorder: Was started on Lexapro a week PTA.   - xanax 0.5 BID and 1mg  qHS  - Cont to monitor  - Controlled 10. HTN: monitor BP bid. Continue Cozaar daily.  11. Dehydration: Encouraging PO fluids.   -Resolved 12. DM type 2: Monitor BS ac/hs. Resumed metformin.     -Fasting CBGs 124 today  -Will cont to monitor 13. Chronic UTIs: currently On trimpex for prophylaxis.  - Bactrim completed 14. Constipation: Resolved 15. Vertigo:   -prn meclizine.    -vestibular evaluation by therapy to better determine etiology and potential treatment.   -enouraged increased fluid intake  -Resolved  LOS (Days) 16 A FACE TO FACE EVALUATION WAS PERFORMED  Gabriela Rice Lorie Phenix 09/08/2015 9:16 AM

## 2015-09-08 NOTE — Progress Notes (Signed)
Occupational Therapy Discharge Summary  Patient Details  Name: Gabriela Rice MRN: [id number] Date of Birth: [birth year]   Patient has met 10 of 11 long term goals due to improved activity tolerance, improved balance, ability to compensate for deficits and improved awareness.  Patient to discharge at overall modfied independent at w/c level, and Supervision level during mobility.  Patient's care partner is independent to provide the necessary intermittent assistance at discharge with occasional physical assistance required due to fatigue.   After completion of final BADL session, patient demonstrated her improved lower body dressing skills using adapted dressing technique by lifting her right leg and crossing it over her left.   Pt never attempted this technique during regular treatment sessions.  Reasons goals not HYQ:MVHQION prefers to allow her husband to complete laundry tasks.  Recommendation:  Patient will not require ongoing skilled OT services to continue to advance functional skills in the area of BADL and iADL.  Equipment: No equipment provided  Reasons for discharge: discharge from hospital  Patient/family agrees with progress made and goals achieved: Yes  OT Discharge Precautions/Restrictions  Precautions Precautions: Fall Precaution Comments: MS-related fatigue; temperature sensitivity Restrictions Weight Bearing Restrictions: No  Pain Pain Assessment Pain Assessment: 0-10 Pain Score: 3  Pain Type: Chronic pain Pain Location: Back Pain Descriptors / Indicators: Aching Pain Onset: On-going Patients Stated Pain Goal: 0 Pain Intervention(s): Rest;Repositioned Multiple Pain Sites: No  ADL ADL ADL Comments: see Functionl Assessment Tool  Vision/Perception  Vision- History Baseline Vision/History: Wears glasses Wears Glasses: Distance only Patient Visual Report: No change from baseline Vision- Assessment Vision Assessment?: No apparent visual  deficits Perception Comments: Appears WFL   Cognition Overall Cognitive Status: Within Functional Limits for tasks assessed Arousal/Alertness: Awake/alert Orientation Level: Oriented X4 Attention: Alternating Alternating Attention: Appears intact Memory: Appears intact Awareness: Appears intact Problem Solving: Appears intact Safety/Judgment: Appears intact  Sensation Sensation Light Touch: Impaired Detail Light Touch Impaired Details: Impaired LLE;Impaired RLE Stereognosis: Not tested Hot/Cold: Appears Intact Proprioception: Appears Intact Additional Comments: pt reports increased L lateral leg numbness/tingling and increase in B feet Coordination Gross Motor Movements are Fluid and Coordinated: No Fine Motor Movements are Fluid and Coordinated: No Coordination and Movement Description: weakness in B LEs  Motor  Motor Motor: Abnormal tone;Motor impersistence Motor - Discharge Observations: low tone in B legs due to weakness  Mobility  Bed Mobility Bed Mobility: Rolling Right;Rolling Left;Right Sidelying to Sit;Sit to Supine Rolling Right: 6: Modified independent (Device/Increase time);With rail Rolling Left: 6: Modified independent (Device/Increase time);With rail Right Sidelying to Sit: With rails;HOB elevated;6: Modified independent (Device/Increase time) Sit to Supine: HOB flat;With rail;Other (comment);3: Mod assist Sit to Supine - Details (indicate cue type and reason): Requires asssit to lift both legs back into bed. Transfers Transfers: Sit to Stand;Stand to Sit Sit to Stand: 6: Modified independent (Device/Increase time) Stand to Sit: 6: Modified independent (Device/Increase time)   Trunk/Postural Assessment  Cervical Assessment Cervical Assessment: Within Functional Limits Thoracic Assessment Thoracic Assessment: Within Functional Limits Lumbar Assessment Lumbar Assessment: Within Functional Limits Postural Control Postural Control: Within Functional  Limits   Balance Static Sitting Balance Static Sitting - Balance Support: Feet supported;Left upper extremity supported;Right upper extremity supported Static Sitting - Level of Assistance: 6: Modified independent (Device/Increase time) Dynamic Sitting Balance Dynamic Sitting - Balance Support: Left upper extremity supported;Right upper extremity supported;Feet supported Dynamic Sitting - Level of Assistance: 6: Modified independent (Device/Increase time) Static Standing Balance Static Standing - Balance Support: Bilateral upper extremity supported;During functional activity  Static Standing - Level of Assistance: 6: Modified independent (Device/Increase time) Dynamic Standing Balance Dynamic Standing - Balance Support: Bilateral upper extremity supported;During functional activity Dynamic Standing - Level of Assistance: 6: Modified independent (Device/Increase time)  Extremity/Trunk Assessment RUE Assessment RUE Assessment: Within Functional Limits LUE Assessment LUE Assessment: Within Functional Limits   See Function Navigator for Current Functional Status.  [facility] 09/09/2015, 10:17 AM

## 2015-09-08 NOTE — Progress Notes (Signed)
Social Work  Discharge Note  The overall goal for the admission was met for:   Discharge location: Yes-HOME WITH HUSBAND-WORKS THIRD SHIFT  Length of Stay: Yes-16 DAYS  Discharge activity level: Yes-MOD/I [place]  Home/community participation: Yes  Services provided included: MD, RD, PT, OT, RN, CM, TR, Pharmacy, Neuropsych and SW  Financial Services: Medicare and Private Insurance: [place]  Follow-up services arranged: Home Health: [place], DME: [facility] and Patient/Family has no preference for HH/DME agencies  Comments (or additional information):HUSBAND WAS HERE DAY OF [place], [place]  Patient/Family verbalized understanding of follow-up arrangements: Yes  Individual responsible for coordination of the follow-up plan: PATIENT & HUSBAND  Confirmed correct DME delivered: Elease Hashimoto 09/08/2015    Elease Hashimoto

## 2015-09-08 NOTE — Discharge Instructions (Signed)
Inpatient Rehab Discharge Instructions  Gabriela Rice Discharge date and time: 09/08/15   Activities/Precautions/ Functional Status: Activity: activity as tolerated Diet: diabetic diet Wound Care: none needed Functional status:  ___ No restrictions     ___ Walk up steps independently ___ 24/7 supervision/assistance   ___ Walk up steps with assistance _X__ Intermittent supervision/assistance  ___ Bathe/dress independently ___ Walk with walker     __X_ Bathe/dress with assistance ___ Walk Independently    ___ Shower independently _X__ Walk with assistance    ___ Shower with assistance _X__ No alcohol     ___ Return to work/school ________  Special Instructions:    COMMUNITY REFERRALS UPON DISCHARGE:    Home Health:   [facility] [place]   Date of last service:09/08/2015   Medical Equipment/Items Ordered:WHEELCHAIR  Agency/Supplier:[facility]   [phone]   GENERAL COMMUNITY RESOURCES FOR PATIENT/FAMILY: Support Groups:MS SOCIETY   My questions have been answered and I understand these instructions. I will adhere to these goals and the provided educational materials after my discharge from the hospital.  Patient/Caregiver Signature _______________________________ Date __________  Clinician Signature _______________________________________ Date __________  Please bring this form and your medication list with you to all your follow-up doctor's appointments.

## 2015-09-08 NOTE — Progress Notes (Signed)
Pt. Got d/c instructions and follow up appointments,medication from main pharmacy was picked up and give back to pt.Pt. Ready to go home with husband.

## 2015-09-08 NOTE — Consult Note (Signed)
  PSYCHODIAGNOSTIC EVALUATION - [facility]   Ms. Gabriela Rice is a 59 year old woman, who was seen for an initial diagnostic evaluation in the setting of Multiple Sclerosis (MS).  According to her medical record, she was admitted on 08/20/15 with three day history of increase in bilateral lower extremity weakness, frequent falls and progressive gait problems as well as worsening abdominal pain.  She was started on IV solumedrol for MS exacerbation and was deemed appropriate for inpatient rehabilitation.      During the current session, [name] reported feeling "shocked" based on the change in her discharge date.  She reported mild concern about her ability to transfer into bed at home, but otherwise denied concerns about discharge and repeatedly stated that she feels as though she has significant social support.  [name] reported a history consistent with anxiety, including infrequent "panic attacks" characterized by restlessness and significant discomfort as well as worry about having subsequent attacks.  She also noted depression in the past, but stated that Lexapro and increased frequency of use of Xanax have been helpful in managing her symptoms.  She denied current symptoms of major depression, including suicidal ideation, though she mentioned that she has had some low mood as she adjusts to her current medical condition.  [name] remarked that the biggest lesson she has learned during her current hospital stay is how to not over-exert herself.  She said that she has realized that she does not have control over many things at this time and is currently feeling relaxed about upcoming holidays and feeling less anxious than usual, as her main priority this year is to simply be together with loved ones.  [name] spent significant time during today's session venting about her complex relationship with her mother, who also has a diagnosis of MS and who  tends to over-identify (per her report) regarding symptoms.    From a cognitive standpoint, [name] stated that she has noticed mild memory problems that may be more than expected given her age, though she was not greatly concerned about them.    IMPRESSION:  [name] seems to be experiencing mild depressed mood at the level of adjustment to illness.  She also (per her report) may have an anxiety disorder, though her mood appears relatively well-managed at this time.  [name] was provided with validation of her emotions and her experience was normalized.  She was offered information about the MS society, but stated that she was not interested because she is introverted.  She was encouraged to consider seeking individual psychotherapy for additional support coping with her MS exacerbation.  She agreed to consider that option, but was not ready to commit yet.  Time was also spent discussing advantages of obtaining a baseline neuropsychological evaluation given that she has noticed some changes in cognitive functioning, which, owing to MS, may worsen over time.  Information on providers in her area should be included in her discharge paperwork in case she chooses to pursue that option.    DIAGNOSES:   Multiple Sclerosis Adjustment disorder with anxiety and depression  Marlane Hatcher, Psy.D.  Clinical Neuropsychologist

## 2015-09-11 ENCOUNTER — Ambulatory Visit: Payer: Medicare Other | Admitting: Neurology

## 2015-09-11 DIAGNOSIS — M47816 Spondylosis without myelopathy or radiculopathy, lumbar region: Secondary | ICD-10-CM | POA: Diagnosis not present

## 2015-09-11 DIAGNOSIS — I10 Essential (primary) hypertension: Secondary | ICD-10-CM | POA: Diagnosis not present

## 2015-09-11 DIAGNOSIS — M5126 Other intervertebral disc displacement, lumbar region: Secondary | ICD-10-CM | POA: Diagnosis not present

## 2015-09-11 DIAGNOSIS — G35 Multiple sclerosis: Secondary | ICD-10-CM | POA: Diagnosis not present

## 2015-09-11 DIAGNOSIS — E119 Type 2 diabetes mellitus without complications: Secondary | ICD-10-CM | POA: Diagnosis not present

## 2015-09-13 ENCOUNTER — Ambulatory Visit: Payer: Medicare Other | Admitting: Neurology

## 2015-09-13 ENCOUNTER — Telehealth: Payer: Self-pay | Admitting: *Deleted

## 2015-09-13 DIAGNOSIS — M5126 Other intervertebral disc displacement, lumbar region: Secondary | ICD-10-CM | POA: Diagnosis not present

## 2015-09-13 DIAGNOSIS — E119 Type 2 diabetes mellitus without complications: Secondary | ICD-10-CM | POA: Diagnosis not present

## 2015-09-13 DIAGNOSIS — G35 Multiple sclerosis: Secondary | ICD-10-CM | POA: Diagnosis not present

## 2015-09-13 DIAGNOSIS — I10 Essential (primary) hypertension: Secondary | ICD-10-CM | POA: Diagnosis not present

## 2015-09-13 DIAGNOSIS — M47816 Spondylosis without myelopathy or radiculopathy, lumbar region: Secondary | ICD-10-CM | POA: Diagnosis not present

## 2015-09-13 NOTE — Telephone Encounter (Signed)
Mallory, OT, [facility] called for verbal orders for home health, once a week for 1 week followed by twice a week for four weeks.  I returned the phone call and gave verbal orders per office protocol

## 2015-09-15 DIAGNOSIS — I10 Essential (primary) hypertension: Secondary | ICD-10-CM | POA: Diagnosis not present

## 2015-09-15 DIAGNOSIS — M47816 Spondylosis without myelopathy or radiculopathy, lumbar region: Secondary | ICD-10-CM | POA: Diagnosis not present

## 2015-09-15 DIAGNOSIS — M5126 Other intervertebral disc displacement, lumbar region: Secondary | ICD-10-CM | POA: Diagnosis not present

## 2015-09-15 DIAGNOSIS — E119 Type 2 diabetes mellitus without complications: Secondary | ICD-10-CM | POA: Diagnosis not present

## 2015-09-15 DIAGNOSIS — G35 Multiple sclerosis: Secondary | ICD-10-CM | POA: Diagnosis not present

## 2015-09-18 DIAGNOSIS — I10 Essential (primary) hypertension: Secondary | ICD-10-CM | POA: Diagnosis not present

## 2015-09-18 DIAGNOSIS — G35 Multiple sclerosis: Secondary | ICD-10-CM | POA: Diagnosis not present

## 2015-09-18 DIAGNOSIS — M47816 Spondylosis without myelopathy or radiculopathy, lumbar region: Secondary | ICD-10-CM | POA: Diagnosis not present

## 2015-09-18 DIAGNOSIS — M5126 Other intervertebral disc displacement, lumbar region: Secondary | ICD-10-CM | POA: Diagnosis not present

## 2015-09-18 DIAGNOSIS — E119 Type 2 diabetes mellitus without complications: Secondary | ICD-10-CM | POA: Diagnosis not present

## 2015-09-20 DIAGNOSIS — E119 Type 2 diabetes mellitus without complications: Secondary | ICD-10-CM | POA: Diagnosis not present

## 2015-09-20 DIAGNOSIS — M47816 Spondylosis without myelopathy or radiculopathy, lumbar region: Secondary | ICD-10-CM | POA: Diagnosis not present

## 2015-09-20 DIAGNOSIS — M5126 Other intervertebral disc displacement, lumbar region: Secondary | ICD-10-CM | POA: Diagnosis not present

## 2015-09-20 DIAGNOSIS — G35 Multiple sclerosis: Secondary | ICD-10-CM | POA: Diagnosis not present

## 2015-09-20 DIAGNOSIS — I10 Essential (primary) hypertension: Secondary | ICD-10-CM | POA: Diagnosis not present

## 2015-09-26 ENCOUNTER — Other Ambulatory Visit: Payer: Self-pay | Admitting: Internal Medicine

## 2015-09-29 ENCOUNTER — Encounter: Payer: Self-pay | Admitting: Internal Medicine

## 2015-09-29 ENCOUNTER — Telehealth: Payer: Self-pay | Admitting: Gastroenterology

## 2015-09-29 DIAGNOSIS — K754 Autoimmune hepatitis: Secondary | ICD-10-CM

## 2015-09-29 MED ORDER — AZATHIOPRINE 50 MG PO TABS: 150.0000 mg | ORAL_TABLET | Freq: Every day | ORAL | Status: DC

## 2015-09-29 NOTE — Telephone Encounter (Signed)
Patient called after hours for a refill of her Azathioprine for AIH. She reports she has been out for a couple of days. Her last CBC and CMP in our system is October with some mild elevation in AST and ALT. She reported blood work at her primary care earlier this week which showed mostly normal LFTs. I have asked that our office obtain those labs to ensure stable. I gave her a refill of Azathioprine to get through until she sees Dr. Henrene Pastor in January. She agreed.

## 2015-10-04 DIAGNOSIS — E119 Type 2 diabetes mellitus without complications: Secondary | ICD-10-CM | POA: Diagnosis not present

## 2015-10-04 DIAGNOSIS — M5126 Other intervertebral disc displacement, lumbar region: Secondary | ICD-10-CM | POA: Diagnosis not present

## 2015-10-04 DIAGNOSIS — G35 Multiple sclerosis: Secondary | ICD-10-CM | POA: Diagnosis not present

## 2015-10-04 DIAGNOSIS — M47816 Spondylosis without myelopathy or radiculopathy, lumbar region: Secondary | ICD-10-CM | POA: Diagnosis not present

## 2015-10-04 DIAGNOSIS — I10 Essential (primary) hypertension: Secondary | ICD-10-CM | POA: Diagnosis not present

## 2015-10-06 ENCOUNTER — Telehealth: Payer: Self-pay

## 2015-10-06 DIAGNOSIS — E119 Type 2 diabetes mellitus without complications: Secondary | ICD-10-CM | POA: Diagnosis not present

## 2015-10-06 DIAGNOSIS — M5126 Other intervertebral disc displacement, lumbar region: Secondary | ICD-10-CM | POA: Diagnosis not present

## 2015-10-06 DIAGNOSIS — G35 Multiple sclerosis: Secondary | ICD-10-CM | POA: Diagnosis not present

## 2015-10-06 DIAGNOSIS — I10 Essential (primary) hypertension: Secondary | ICD-10-CM | POA: Diagnosis not present

## 2015-10-06 DIAGNOSIS — M47816 Spondylosis without myelopathy or radiculopathy, lumbar region: Secondary | ICD-10-CM | POA: Diagnosis not present

## 2015-10-06 NOTE — Telephone Encounter (Signed)
Patient did not pick up Rx from 01/30/15.

## 2015-10-11 DIAGNOSIS — I10 Essential (primary) hypertension: Secondary | ICD-10-CM | POA: Diagnosis not present

## 2015-10-11 DIAGNOSIS — G35 Multiple sclerosis: Secondary | ICD-10-CM | POA: Diagnosis not present

## 2015-10-11 DIAGNOSIS — E119 Type 2 diabetes mellitus without complications: Secondary | ICD-10-CM | POA: Diagnosis not present

## 2015-10-11 DIAGNOSIS — M5126 Other intervertebral disc displacement, lumbar region: Secondary | ICD-10-CM | POA: Diagnosis not present

## 2015-10-11 DIAGNOSIS — M47816 Spondylosis without myelopathy or radiculopathy, lumbar region: Secondary | ICD-10-CM | POA: Diagnosis not present

## 2015-10-13 ENCOUNTER — Telehealth: Payer: Self-pay | Admitting: *Deleted

## 2015-10-13 DIAGNOSIS — G35 Multiple sclerosis: Secondary | ICD-10-CM | POA: Diagnosis not present

## 2015-10-13 DIAGNOSIS — M5126 Other intervertebral disc displacement, lumbar region: Secondary | ICD-10-CM | POA: Diagnosis not present

## 2015-10-13 DIAGNOSIS — E119 Type 2 diabetes mellitus without complications: Secondary | ICD-10-CM | POA: Diagnosis not present

## 2015-10-13 DIAGNOSIS — I10 Essential (primary) hypertension: Secondary | ICD-10-CM | POA: Diagnosis not present

## 2015-10-13 DIAGNOSIS — M47816 Spondylosis without myelopathy or radiculopathy, lumbar region: Secondary | ICD-10-CM | POA: Diagnosis not present

## 2015-10-13 NOTE — Telephone Encounter (Signed)
Mallory OT from [facility] is asking to extend OT 1wk2.  Approval given.

## 2015-10-15 DIAGNOSIS — I10 Essential (primary) hypertension: Secondary | ICD-10-CM | POA: Diagnosis not present

## 2015-10-15 DIAGNOSIS — M5126 Other intervertebral disc displacement, lumbar region: Secondary | ICD-10-CM | POA: Diagnosis not present

## 2015-10-15 DIAGNOSIS — E119 Type 2 diabetes mellitus without complications: Secondary | ICD-10-CM | POA: Diagnosis not present

## 2015-10-15 DIAGNOSIS — G35 Multiple sclerosis: Secondary | ICD-10-CM | POA: Diagnosis not present

## 2015-10-15 DIAGNOSIS — M47816 Spondylosis without myelopathy or radiculopathy, lumbar region: Secondary | ICD-10-CM | POA: Diagnosis not present

## 2015-10-16 DIAGNOSIS — M47816 Spondylosis without myelopathy or radiculopathy, lumbar region: Secondary | ICD-10-CM | POA: Diagnosis not present

## 2015-10-16 DIAGNOSIS — E119 Type 2 diabetes mellitus without complications: Secondary | ICD-10-CM | POA: Diagnosis not present

## 2015-10-16 DIAGNOSIS — G35 Multiple sclerosis: Secondary | ICD-10-CM | POA: Diagnosis not present

## 2015-10-16 DIAGNOSIS — M5126 Other intervertebral disc displacement, lumbar region: Secondary | ICD-10-CM | POA: Diagnosis not present

## 2015-10-16 DIAGNOSIS — I10 Essential (primary) hypertension: Secondary | ICD-10-CM | POA: Diagnosis not present

## 2015-10-18 DIAGNOSIS — G35 Multiple sclerosis: Secondary | ICD-10-CM | POA: Diagnosis not present

## 2015-10-18 DIAGNOSIS — M5126 Other intervertebral disc displacement, lumbar region: Secondary | ICD-10-CM | POA: Diagnosis not present

## 2015-10-18 DIAGNOSIS — I10 Essential (primary) hypertension: Secondary | ICD-10-CM | POA: Diagnosis not present

## 2015-10-18 DIAGNOSIS — M47816 Spondylosis without myelopathy or radiculopathy, lumbar region: Secondary | ICD-10-CM | POA: Diagnosis not present

## 2015-10-18 DIAGNOSIS — E119 Type 2 diabetes mellitus without complications: Secondary | ICD-10-CM | POA: Diagnosis not present

## 2015-10-23 ENCOUNTER — Other Ambulatory Visit (INDEPENDENT_AMBULATORY_CARE_PROVIDER_SITE_OTHER): Payer: BLUE CROSS/BLUE SHIELD

## 2015-10-23 ENCOUNTER — Encounter: Payer: Self-pay | Admitting: Internal Medicine

## 2015-10-23 ENCOUNTER — Ambulatory Visit (INDEPENDENT_AMBULATORY_CARE_PROVIDER_SITE_OTHER): Payer: BLUE CROSS/BLUE SHIELD | Admitting: Internal Medicine

## 2015-10-23 VITALS — BP 100/66 | HR 80 | Wt 195.0 lb

## 2015-10-23 DIAGNOSIS — R7989 Other specified abnormal findings of blood chemistry: Secondary | ICD-10-CM | POA: Diagnosis not present

## 2015-10-23 DIAGNOSIS — K5900 Constipation, unspecified: Secondary | ICD-10-CM

## 2015-10-23 DIAGNOSIS — K754 Autoimmune hepatitis: Secondary | ICD-10-CM

## 2015-10-23 DIAGNOSIS — K5909 Other constipation: Secondary | ICD-10-CM

## 2015-10-23 DIAGNOSIS — R945 Abnormal results of liver function studies: Secondary | ICD-10-CM

## 2015-10-23 LAB — CBC WITH DIFFERENTIAL/PLATELET
BASOS ABS: 0 10*3/uL (ref 0.0–0.1)
BASOS PCT: 0.4 % (ref 0.0–3.0)
Eosinophils Absolute: 0.1 10*3/uL (ref 0.0–0.7)
Eosinophils Relative: 2.9 % (ref 0.0–5.0)
HEMATOCRIT: 37.8 % (ref 36.0–46.0)
Hemoglobin: 12.8 g/dL (ref 12.0–15.0)
LYMPHS ABS: 1 10*3/uL (ref 0.7–4.0)
LYMPHS PCT: 22.5 % (ref 12.0–46.0)
MCHC: 33.8 g/dL (ref 30.0–36.0)
MCV: 93.7 fl (ref 78.0–100.0)
MONOS PCT: 9.2 % (ref 3.0–12.0)
Monocytes Absolute: 0.4 10*3/uL (ref 0.1–1.0)
NEUTROS ABS: 2.8 10*3/uL (ref 1.4–7.7)
NEUTROS PCT: 65 % (ref 43.0–77.0)
PLATELETS: 192 10*3/uL (ref 150.0–400.0)
RBC: 4.03 Mil/uL (ref 3.87–5.11)
RDW: 16.1 % — AB (ref 11.5–15.5)
WBC: 4.3 10*3/uL (ref 4.0–10.5)

## 2015-10-23 LAB — HEPATIC FUNCTION PANEL
ALBUMIN: 4.2 g/dL (ref 3.5–5.2)
ALK PHOS: 56 U/L (ref 39–117)
ALT: 32 U/L (ref 0–35)
AST: 51 U/L — ABNORMAL HIGH (ref 0–37)
Bilirubin, Direct: 0.2 mg/dL (ref 0.0–0.3)
TOTAL PROTEIN: 6.8 g/dL (ref 6.0–8.3)
Total Bilirubin: 1 mg/dL (ref 0.2–1.2)

## 2015-10-23 MED ORDER — AZATHIOPRINE 50 MG PO TABS: ORAL_TABLET | ORAL | Status: DC

## 2015-10-23 NOTE — Patient Instructions (Signed)
Your physician has requested that you go to the basement for the following lab work before leaving today:  CBC LFTS  We have sent the following medications to your pharmacy for you to pick up at your convenience:  [place]  Please follow up in 6 months

## 2015-10-23 NOTE — Progress Notes (Signed)
HISTORY OF PRESENT ILLNESS:  Gabriela Rice is a 60 y.o. female with multiple medical problems including diabetes mellitus, hyperlipidemia, multiple sclerosis, and autoimmune hepatitis diagnosed in May 2014 for which she is followed in this office. I last saw the patient March 2015. Summary from that visit as follows: ASSESSMENT: #1. Autoimmune hepatitis. Biopsy supported. Normal liver function tests on Imuran 150 mg daily #2. Brother with hemochromatosis. The patient is heterozygous for 2 separate mutations with no evidence for iron storage disease on biopsy #3. Previous evaluation elsewhere for elevated liver tests including liver biopsy demonstrating fatty liver without fibrosis #4. Father with a history of cirrhosis. Etiology uncertain. Significant alcohol history reported #5. Multiple medical problems #6. History of non-adenomatous colon polyp 2011 #7. Functional constipation  PLAN: #1. Continue Imuran 150 mg daily #2. Repeat liver tests next month and as directed thereafter #3. MiraLax as needed for constipation #4. Routine colonoscopy 2021 #5. Routine GI followup in 6 months. Sooner if needed\  She was up slightly seen by the GI PA November 2015 with chief complaint of constipation. Doing well from a liver standpoint. Office follow-up in 6 months recommended. She has not been seen since. A bit sporadic with her blood work. She is accompanied today by her husband. She was hospitalized for proximal me 3 weeks in late November and early summer with exacerbation of her MS and required inpatient rehabilitation. Apparently she ran out of her Imuran for approximately 5 days. Contact my partner who refill the medication until this office evaluation. Looking at the hospital record, her LFTs were elevated above baseline. She tells me that she had follow-up LFTs at some point with her PCP that were improved. She was also noted to be leukopenic on her blood count about 2 months ago. GI complaints  include ongoing constipation and bloating. When she takes MiraLAX regularly, constipation is better. Last colonoscopy November 2011 with non-adenoma.  REVIEW OF SYSTEMS:  All non-GI ROS negative except for anxiety, arthritis, back pain, visual change, fatigue, headaches, heart murmur, itching, sleeping problems, excessive urination, urinary leakage  Past Medical History  Diagnosis Date  . DM (diabetes mellitus) (Gabriela Rice)   . Hyperlipidemia   . Multiple sclerosis (Gabriela Rice)   . Allergic rhinitis   . Elevated LFTs   . Obesity   . Vitamin D deficiency   . Abnormality of gait 02/16/2013  . Colon polyps   . Diverticulosis   . Hemorrhoids   . Hepatitis     with cholestasis  . Hemorrhoids   . Autoimmune hepatitis (Gabriela Rice)   . Allergy   . Hypertension   . Autoimmune hepatitis (Gabriela Rice)   . Anxiety   . Migraines     Past Surgical History  Procedure Laterality Date  . Myoectomy    . Cesarean section      x 2  . Tonsillectomy and adenoidectomy    . Abdominal hysterectomy      Social History Gabriela Rice  reports that she quit smoking about 20 years ago. Her smoking use included Cigarettes. She has a 1 pack-year smoking history. She has never used smokeless tobacco. She reports that she does not drink alcohol or use illicit drugs.  family history includes Brain cancer in her maternal uncle; Cirrhosis in her father; Colon cancer in her maternal aunt and maternal uncle; Diabetes in her mother; Heart disease in her maternal grandmother; Multiple sclerosis in her mother; Uterine cancer in her maternal aunt.  Allergies  Allergen Reactions  . Penicillins Other (See  Comments)    seizure as child Has patient had a PCN reaction causing immediate rash, facial/tongue/throat swelling, SOB or lightheadedness with hypotension: No Has patient had a PCN reaction causing severe rash involving mucus membranes or skin necrosis:No Has patient had a PCN reaction that required hospitalizationNo Has patient had  a PCN reaction occurring within the last 10 years:No If all of the above answers are "NO", then may proceed with Cephalosporin use.    . Codeine Swelling    hands swell       PHYSICAL EXAMINATION: Vital signs: BP 100/66 mmHg  Pulse 80  Ht   Wt 195 lb (88.451 kg)  Constitutional: Overweight, in a wheelchair, but generally well-appearing, no acute distress Psychiatric: alert and oriented x3, cooperative Eyes: extraocular movements intact, anicteric, conjunctiva pink Mouth: oral pharynx moist, no lesions Neck: supple without thyromegaly Lymph: no lymphadenopathy Cardiovascular: heart regular rate and rhythm, no murmur Lungs: clear to auscultation bilaterally Abdomen: soft, obese, nontender, nondistended, no obvious ascites, no peritoneal signs, normal bowel sounds, no organomegaly Rectal: Omitted Extremities: no clubbing cyanosis or lower extremity edema bilaterally Skin: no lesions on visible extremities Neuro: Cranial nerves intact. No asterixis.  ASSESSMENT: #1. Autoimmune hepatitis diagnosed May 2014. Biopsy supported. Normal liver function tests on Imuran 150 mg daily previously. Recent hospitalization for MS with elevated LFTs and decreased white count #2. Brother with hemochromatosis. The patient is heterozygous for 2 separate mutations with no evidence for iron storage disease on biopsy #3. Previous evaluation elsewhere for elevated liver tests including liver biopsy demonstrating fatty liver without fibrosis #4. Father with a history of cirrhosis. Etiology uncertain. Significant alcohol history reported #5. Multiple medical problems including multiple sclerosis with recent exacerbation requiring extended hospital stay #6. History of non-adenomatous colon polyp 2011 #7. Functional constipation. Ongoing  PLAN: #1. Continue Imuran 150 mg daily. Medication refilled. #2. Repeat liver tests and CBC today. Compliance with regular blood work moving forward stressed. Will need  CBC at least every 3 months. Liver tests depending upon results #3. MiraLax as needed for constipation. Discussed titrating strategies #4. Routine colonoscopy 2021 #5. Routine GI followup in 6 months. Sooner if needed

## 2015-10-24 ENCOUNTER — Other Ambulatory Visit: Payer: Self-pay

## 2015-10-24 DIAGNOSIS — E119 Type 2 diabetes mellitus without complications: Secondary | ICD-10-CM | POA: Diagnosis not present

## 2015-10-24 DIAGNOSIS — I10 Essential (primary) hypertension: Secondary | ICD-10-CM | POA: Diagnosis not present

## 2015-10-24 DIAGNOSIS — M47816 Spondylosis without myelopathy or radiculopathy, lumbar region: Secondary | ICD-10-CM | POA: Diagnosis not present

## 2015-10-24 DIAGNOSIS — M5126 Other intervertebral disc displacement, lumbar region: Secondary | ICD-10-CM | POA: Diagnosis not present

## 2015-10-24 DIAGNOSIS — G35 Multiple sclerosis: Secondary | ICD-10-CM | POA: Diagnosis not present

## 2015-10-24 DIAGNOSIS — R7989 Other specified abnormal findings of blood chemistry: Secondary | ICD-10-CM

## 2015-10-24 DIAGNOSIS — R945 Abnormal results of liver function studies: Principal | ICD-10-CM

## 2015-10-25 DIAGNOSIS — M5126 Other intervertebral disc displacement, lumbar region: Secondary | ICD-10-CM | POA: Diagnosis not present

## 2015-10-25 DIAGNOSIS — I10 Essential (primary) hypertension: Secondary | ICD-10-CM | POA: Diagnosis not present

## 2015-10-25 DIAGNOSIS — M47816 Spondylosis without myelopathy or radiculopathy, lumbar region: Secondary | ICD-10-CM | POA: Diagnosis not present

## 2015-10-25 DIAGNOSIS — E119 Type 2 diabetes mellitus without complications: Secondary | ICD-10-CM | POA: Diagnosis not present

## 2015-10-25 DIAGNOSIS — G35 Multiple sclerosis: Secondary | ICD-10-CM | POA: Diagnosis not present

## 2015-10-26 DIAGNOSIS — I10 Essential (primary) hypertension: Secondary | ICD-10-CM | POA: Diagnosis not present

## 2015-10-26 DIAGNOSIS — G35 Multiple sclerosis: Secondary | ICD-10-CM | POA: Diagnosis not present

## 2015-10-26 DIAGNOSIS — E119 Type 2 diabetes mellitus without complications: Secondary | ICD-10-CM | POA: Diagnosis not present

## 2015-10-26 DIAGNOSIS — M47816 Spondylosis without myelopathy or radiculopathy, lumbar region: Secondary | ICD-10-CM | POA: Diagnosis not present

## 2015-10-26 DIAGNOSIS — M5126 Other intervertebral disc displacement, lumbar region: Secondary | ICD-10-CM | POA: Diagnosis not present

## 2015-10-31 DIAGNOSIS — M47816 Spondylosis without myelopathy or radiculopathy, lumbar region: Secondary | ICD-10-CM | POA: Diagnosis not present

## 2015-10-31 DIAGNOSIS — M5126 Other intervertebral disc displacement, lumbar region: Secondary | ICD-10-CM | POA: Diagnosis not present

## 2015-10-31 DIAGNOSIS — I10 Essential (primary) hypertension: Secondary | ICD-10-CM | POA: Diagnosis not present

## 2015-10-31 DIAGNOSIS — G35 Multiple sclerosis: Secondary | ICD-10-CM | POA: Diagnosis not present

## 2015-10-31 DIAGNOSIS — E119 Type 2 diabetes mellitus without complications: Secondary | ICD-10-CM | POA: Diagnosis not present

## 2015-11-06 DIAGNOSIS — I10 Essential (primary) hypertension: Secondary | ICD-10-CM | POA: Diagnosis not present

## 2015-11-06 DIAGNOSIS — G35 Multiple sclerosis: Secondary | ICD-10-CM | POA: Diagnosis not present

## 2015-11-06 DIAGNOSIS — M5126 Other intervertebral disc displacement, lumbar region: Secondary | ICD-10-CM | POA: Diagnosis not present

## 2015-11-06 DIAGNOSIS — M47816 Spondylosis without myelopathy or radiculopathy, lumbar region: Secondary | ICD-10-CM | POA: Diagnosis not present

## 2015-11-06 DIAGNOSIS — E119 Type 2 diabetes mellitus without complications: Secondary | ICD-10-CM | POA: Diagnosis not present

## 2015-11-07 ENCOUNTER — Encounter: Payer: Self-pay | Admitting: Internal Medicine

## 2015-11-08 DIAGNOSIS — M47816 Spondylosis without myelopathy or radiculopathy, lumbar region: Secondary | ICD-10-CM | POA: Diagnosis not present

## 2015-11-08 DIAGNOSIS — G35 Multiple sclerosis: Secondary | ICD-10-CM | POA: Diagnosis not present

## 2015-11-08 DIAGNOSIS — I10 Essential (primary) hypertension: Secondary | ICD-10-CM | POA: Diagnosis not present

## 2015-11-08 DIAGNOSIS — M5126 Other intervertebral disc displacement, lumbar region: Secondary | ICD-10-CM | POA: Diagnosis not present

## 2015-11-08 DIAGNOSIS — E119 Type 2 diabetes mellitus without complications: Secondary | ICD-10-CM | POA: Diagnosis not present

## 2015-11-09 DIAGNOSIS — E119 Type 2 diabetes mellitus without complications: Secondary | ICD-10-CM

## 2015-11-09 DIAGNOSIS — M5126 Other intervertebral disc displacement, lumbar region: Secondary | ICD-10-CM | POA: Diagnosis not present

## 2015-11-09 DIAGNOSIS — I10 Essential (primary) hypertension: Secondary | ICD-10-CM

## 2015-11-09 DIAGNOSIS — M47816 Spondylosis without myelopathy or radiculopathy, lumbar region: Secondary | ICD-10-CM | POA: Diagnosis not present

## 2015-11-09 DIAGNOSIS — G35 Multiple sclerosis: Secondary | ICD-10-CM | POA: Diagnosis not present

## 2015-11-22 ENCOUNTER — Other Ambulatory Visit: Payer: Self-pay | Admitting: Neurology

## 2015-11-22 DIAGNOSIS — G35 Multiple sclerosis: Secondary | ICD-10-CM | POA: Diagnosis not present

## 2015-11-22 DIAGNOSIS — E119 Type 2 diabetes mellitus without complications: Secondary | ICD-10-CM

## 2015-11-22 DIAGNOSIS — M47816 Spondylosis without myelopathy or radiculopathy, lumbar region: Secondary | ICD-10-CM | POA: Diagnosis not present

## 2015-11-22 DIAGNOSIS — M5126 Other intervertebral disc displacement, lumbar region: Secondary | ICD-10-CM | POA: Diagnosis not present

## 2015-11-22 DIAGNOSIS — I10 Essential (primary) hypertension: Secondary | ICD-10-CM

## 2015-12-04 ENCOUNTER — Ambulatory Visit (INDEPENDENT_AMBULATORY_CARE_PROVIDER_SITE_OTHER): Payer: BLUE CROSS/BLUE SHIELD | Admitting: Neurology

## 2015-12-04 ENCOUNTER — Encounter: Payer: Self-pay | Admitting: Neurology

## 2015-12-04 VITALS — BP 110/82 | HR 78 | Resp 20

## 2015-12-04 DIAGNOSIS — G8929 Other chronic pain: Secondary | ICD-10-CM | POA: Diagnosis not present

## 2015-12-04 DIAGNOSIS — R269 Unspecified abnormalities of gait and mobility: Secondary | ICD-10-CM | POA: Diagnosis not present

## 2015-12-04 DIAGNOSIS — G35 Multiple sclerosis: Secondary | ICD-10-CM

## 2015-12-04 DIAGNOSIS — M545 Low back pain: Secondary | ICD-10-CM

## 2015-12-04 DIAGNOSIS — R29898 Other symptoms and signs involving the musculoskeletal system: Secondary | ICD-10-CM | POA: Diagnosis not present

## 2015-12-04 MED ORDER — BACLOFEN 10 MG PO TABS: 5.0000 mg | ORAL_TABLET | Freq: Three times a day (TID) | ORAL | Status: DC

## 2015-12-04 MED ORDER — GABAPENTIN 300 MG PO CAPS: 300.0000 mg | ORAL_CAPSULE | Freq: Three times a day (TID) | ORAL | Status: DC

## 2015-12-04 NOTE — Patient Instructions (Addendum)
   With the baclofen, start at 1/2 tablet twice a day for 3 weeks, then take 1/2 tablet three times a day. Watch for drowsiness.   Multiple Sclerosis Multiple sclerosis (MS) is a disease of the central nervous system. It leads to the loss of the insulating covering of the nerves (myelin sheath) of your brain. When this happens, brain signals do not get sent properly or may not get sent at all. The age of onset of MS varies.  CAUSES The cause of MS is unknown. However, it is more common in the [place] than in the [place]. RISK FACTORS There is a higher number of women with MS than men. MS is not an illness that is passed down to you from your family members (inherited). However, your risk of MS is higher if you have a relative with MS. SIGNS AND SYMPTOMS  The symptoms of MS occur in episodes or attacks. These attacks may last weeks to months. There may be long periods of almost no symptoms between attacks. The symptoms of MS vary. This is because of the many different ways it affects the central nervous system. The main symptoms of MS include:  Vision problems and eye pain.  Numbness.  Weakness.  Inability to move your arms, hands, feet, or legs (paralysis).  Balance problems.  Tremors. DIAGNOSIS  Your health care provider can diagnose MS with the help of imaging exams and lab tests. These may include specialized X-ray exams and spinal fluid tests. The best imaging exam to confirm a diagnosis of MS is an MRI. TREATMENT  There is no known cure for MS, but there are medicines that can decrease the number and frequency of attacks. Steroids are often used for short-term relief. Physical and occupational therapy may also help. There are also many new alternative or complementary treatments available to help control the symptoms of MS. Ask your health care provider if any of these other options are right for you. HOME CARE INSTRUCTIONS   Take medicines as directed  by your health care provider.  Exercise as directed by your health care provider. SEEK MEDICAL CARE IF: You begin to feel depressed. SEEK IMMEDIATE MEDICAL CARE IF:  You develop paralysis.  You have problems with bladder, bowel, or sexual function.  You develop mental changes, such as forgetfulness or mood swings.  You have a period of uncontrolled movements (seizure).   This information is not intended to replace advice given to you by your health care provider. Make sure you discuss any questions you have with your health care provider.   Document Released: 09/13/2000 Document Revised: 09/21/2013 Document Reviewed: 05/24/2013 Elsevier Interactive Patient Education [employer].

## 2015-12-04 NOTE — Progress Notes (Signed)
Reason for visit: Multiple sclerosis  Gabriela Rice is an 60 y.o. female  History of present illness:  Gabriela Rice is a 60 year old right-handed white female with a history multiple sclerosis with a spastic paraparesis. The patient has involvement of the right greater than left lower extremity. Around 08/20/2015, the patient went into the hospital for an apparent exacerbation of her multiple sclerosis. The patient fell, and had decreased ability to ambulate. The patient felt weak all over. She required a four-day course of steroids, she was then transferred to rehabilitation. The patient underwent some physical therapy upon return home, and she feels that her ability to ambulate has improved somewhat. The patient walks with a walker. She has had one fall out of bed when she slid on the floor, she did not fall while ambulating. The patient is wearing a brace at night to prevent footdrop on the right, she tends to drag the right leg. The patient denies any other new symptoms of weakness, numbness, or vision changes. She comes to this office for an evaluation.  Past Medical History  Diagnosis Date  . DM (diabetes mellitus) ([place])   . Hyperlipidemia   . Multiple sclerosis ([place])   . Allergic rhinitis   . Elevated LFTs   . Obesity   . Vitamin D deficiency   . Abnormality of gait 02/16/2013  . Colon polyps   . Diverticulosis   . Hemorrhoids   . Hepatitis     with cholestasis  . Hemorrhoids   . Autoimmune hepatitis ([place])   . Allergy   . Hypertension   . Autoimmune hepatitis ([place])   . Anxiety   . Migraines     Past Surgical History  Procedure Laterality Date  . Myoectomy    . Cesarean section      x 2  . Tonsillectomy and adenoidectomy    . Abdominal hysterectomy      Family History  Problem Relation Age of Onset  . Heart disease Maternal Grandmother   . Colon cancer Maternal Aunt   . Uterine cancer Maternal Aunt   . Brain cancer Maternal Uncle   . Diabetes Mother   .  Multiple sclerosis Mother   . Cirrhosis Father   . Colon cancer Maternal Uncle     Social history:  reports that she quit smoking about 20 years ago. Her smoking use included Cigarettes. She has a 1 pack-year smoking history. She has never used smokeless tobacco. She reports that she does not drink alcohol or use illicit drugs.    Allergies  Allergen Reactions  . Penicillins Other (See Comments)    seizure as child Has patient had a PCN reaction causing immediate rash, facial/tongue/throat swelling, SOB or lightheadedness with hypotension: No Has patient had a PCN reaction causing severe rash involving mucus membranes or skin necrosis:No Has patient had a PCN reaction that required hospitalizationNo Has patient had a PCN reaction occurring within the last 10 years:No If all of the above answers are "NO", then may proceed with Cephalosporin use.    . Codeine Swelling    hands swell    Medications:  Prior to Admission medications   Medication Sig Start Date End Date Taking? Authorizing Provider  ALPRAZolam Duanne Moron) 1 MG tablet Take 1 tablet (1 mg total) by mouth at bedtime. 08/07/15  Yes Kathrynn Ducking, MD  aspirin EC 81 MG tablet Take 81 mg by mouth daily.   Yes Historical Provider, MD  azaTHIOprine (IMURAN) 50 MG tablet Take  3 tablets (150 mg total) by mouth daily. 09/29/15  Yes Manus Gunning, MD  azaTHIOprine (IMURAN) 50 MG tablet Take 3 tab at the same time daily. 10/23/15  Yes Irene Shipper, MD  cetirizine (ZYRTEC) 10 MG tablet Take 10 mg by mouth daily.   Yes Historical Provider, MD  Cholecalciferol (VITAMIN D) 2000 UNITS CAPS Take 2,000 Units by mouth daily.    Yes Historical Provider, MD  COPAXONE 40 MG/ML SOSY INJECT 40 MG (1 ML) UNDER THE SKIN THREE TIMES A WEEK 11/22/15  Yes Kathrynn Ducking, MD  escitalopram (LEXAPRO) 10 MG tablet Take 10 mg by mouth daily. 08/14/15  Yes Historical Provider, MD  gabapentin (NEURONTIN) 300 MG capsule Take 1 capsule (300 mg total) by  mouth 3 (three) times daily. Patient taking differently: Take 300 mg by mouth daily.  07/06/14  Yes Kathrynn Ducking, MD  ibuprofen (ADVIL,MOTRIN) 200 MG tablet Take 200 mg by mouth every 6 (six) hours as needed for pain.   Yes Historical Provider, MD  losartan (COZAAR) 100 MG tablet Take 100 mg by mouth daily.  09/02/13  Yes Historical Provider, MD  metFORMIN (GLUCOPHAGE) 500 MG tablet Take 500 mg by mouth 2 (two) times daily with a meal.  03/15/13  Yes Historical Provider, MD  ondansetron (ZOFRAN) 4 MG tablet take 1 tablet every 8 hours if needed for nausea   Yes Irene Shipper, MD  pantoprazole (PROTONIX) 40 MG tablet Take 1 tablet (40 mg total) by mouth daily. 08/23/15  Yes Theodis Blaze, MD  polyethylene glycol powder [facility]) powder Take one scoop in 8 oz of water daily 09/07/15  Yes Ivan Anchors Love, PA-C  pravastatin (PRAVACHOL) 40 MG tablet Take 40 mg by mouth daily.  07/04/14  Yes Historical Provider, MD  traMADol (ULTRAM) 50 MG tablet take 1 to 2 tablets by mouth every 6 hours for if needed for pain 09/21/15  Yes Historical Provider, MD  trimethoprim (TRIMPEX) 100 MG tablet Take 100 mg by mouth daily. 11/09/13  Yes Historical Provider, MD    ROS:  Out of a complete 14 system review of symptoms, the patient complains only of the following symptoms, and all other reviewed systems are negative.   Fatigue Loss of vision Runny nose Heat intolerance Swollen abdomen, constipation Environmental allergies Difficulty urinating, incontinence of bladder, frequency of urination Back pain, walking difficulty, neck pain Dizziness, headache Depression, anxiety   Blood pressure 110/82, pulse 78, resp. rate 20.  Physical Exam  General: The patient is alert and cooperative at the time of the examination. The patient is moderately obese.   Skin: No significant peripheral edema is noted.   Neurologic Exam  Mental status: The patient is alert and oriented x 3 at the time of the  examination. The patient has apparent normal recent and remote memory, with an apparently normal attention span and concentration ability.   Cranial nerves: Facial symmetry is present. Speech is normal, no aphasia or dysarthria is noted. Extraocular movements are full. Visual fields are full. Pupils are equal, round, and reactive to light. Discs are flat bilaterally.   Motor: The patient has good strength in the upper extremities. with the lower extremities, the patient has increased motor tone on the right leg greater than the left. The patient has bilateral 3/5 strength with hip flexion, has mild weakness with dorsiflexion of both feet, and 4/5 strength with knee extension and flexion.   Sensory examination: Soft touch sensation is symmetric on the face, arms,  and legs.  Coordination: The patient has good finger-nose-finger, but she is unable to perform heel-to-shin bilaterally.  Gait and station: The patient is able to stand, she can take a few short steps with assistance. She has a mild circumduction gait with the right leg.   Reflexes: Deep tendon reflexes are symmetric.   Assessment/Plan:   1. Multiple sclerosis  2. Gait disorder  3. Spastic paraparesis  The patient has significant spasticity in both legs, right greater than left. We will add baclofen to the treatment course. The patient will remain on Copaxone. We may consider addition of an AFO brace for the right foot in the future. The patient will contact me she is having problems with this medication. She will follow-up otherwise in 6 months. A prescription was called in for the gabapentin.   Jill Alexanders MD 12/04/2015 7:35 PM  [facility] [address] [place], [place] [address]  Phone [phone] Fax [phone]

## 2015-12-07 DIAGNOSIS — M545 Low back pain: Secondary | ICD-10-CM | POA: Diagnosis not present

## 2015-12-07 DIAGNOSIS — L219 Seborrheic dermatitis, unspecified: Secondary | ICD-10-CM | POA: Diagnosis not present

## 2015-12-07 DIAGNOSIS — E559 Vitamin D deficiency, unspecified: Secondary | ICD-10-CM | POA: Diagnosis not present

## 2015-12-07 DIAGNOSIS — E119 Type 2 diabetes mellitus without complications: Secondary | ICD-10-CM | POA: Diagnosis not present

## 2015-12-07 DIAGNOSIS — I10 Essential (primary) hypertension: Secondary | ICD-10-CM | POA: Diagnosis not present

## 2015-12-07 DIAGNOSIS — F419 Anxiety disorder, unspecified: Secondary | ICD-10-CM | POA: Diagnosis not present

## 2015-12-07 DIAGNOSIS — E782 Mixed hyperlipidemia: Secondary | ICD-10-CM | POA: Diagnosis not present

## 2015-12-15 ENCOUNTER — Encounter: Payer: Self-pay | Admitting: Neurology

## 2015-12-15 ENCOUNTER — Telehealth: Payer: Self-pay | Admitting: Neurology

## 2015-12-15 MED ORDER — PREDNISONE 5 MG PO TABS: ORAL_TABLET | ORAL | Status: DC

## 2015-12-15 NOTE — Telephone Encounter (Signed)
I called the patient. The patient has developed foot pain 2 days ago on the right foot with weightbearing. The pain goes away when she is off of her foot. The pain is in the arch of the foot. I do not think that the baclofen as the cause of the pain, this appears to be a mechanical pain, hospital related to plantar fasciitis. I will give a quick course of prednisone, the pain does not go away, a referral to a podiatrist may be in order.

## 2015-12-16 ENCOUNTER — Other Ambulatory Visit: Payer: Self-pay | Admitting: Neurology

## 2015-12-16 MED ORDER — PREDNISONE 5 MG PO TABS: ORAL_TABLET | ORAL | Status: DC

## 2015-12-16 NOTE — Progress Notes (Signed)
The patient called back, the prednisone was called into the wrong pharmacy, I called in the Rx again.

## 2015-12-26 DIAGNOSIS — G35 Multiple sclerosis: Secondary | ICD-10-CM | POA: Diagnosis not present

## 2015-12-26 DIAGNOSIS — N3020 Other chronic cystitis without hematuria: Secondary | ICD-10-CM | POA: Diagnosis not present

## 2015-12-26 DIAGNOSIS — N3946 Mixed incontinence: Secondary | ICD-10-CM | POA: Diagnosis not present

## 2015-12-27 ENCOUNTER — Other Ambulatory Visit: Payer: Self-pay | Admitting: Neurology

## 2016-01-08 ENCOUNTER — Telehealth: Payer: Self-pay

## 2016-01-08 NOTE — Telephone Encounter (Signed)
-----   Message from Algernon Huxley, RN sent at 10/24/2015  9:27 AM EST ----- Regarding: Labs Pt to have repeat labs in April, orders in epic.

## 2016-01-08 NOTE — Telephone Encounter (Signed)
Pt aware.

## 2016-01-12 ENCOUNTER — Telehealth: Payer: Self-pay | Admitting: Neurology

## 2016-01-12 ENCOUNTER — Encounter: Payer: Self-pay | Admitting: Neurology

## 2016-01-12 NOTE — Telephone Encounter (Signed)
I called both telephone numbers listed, left messages, I will call back later. The patient indicates that she has had increased leg weakness on the baclofen, trying to figure out what is going on with this.

## 2016-01-15 MED ORDER — BACLOFEN 10 MG PO TABS: 10.0000 mg | ORAL_TABLET | Freq: Three times a day (TID) | ORAL | Status: DC

## 2016-01-15 NOTE — Telephone Encounter (Signed)
I called again, left messages on both numbers listed, I will call back later.

## 2016-01-15 NOTE — Telephone Encounter (Signed)
The patient reports an increase in stiffness of the legs, more difficulty with walking, we will need to go up on the baclofen, she is only on 5 mg 3 times daily. I will have her go on 10 mg 3 times daily. A prescription was called in.

## 2016-01-18 ENCOUNTER — Other Ambulatory Visit (INDEPENDENT_AMBULATORY_CARE_PROVIDER_SITE_OTHER): Payer: BLUE CROSS/BLUE SHIELD

## 2016-01-18 DIAGNOSIS — R7989 Other specified abnormal findings of blood chemistry: Secondary | ICD-10-CM | POA: Diagnosis not present

## 2016-01-18 DIAGNOSIS — R945 Abnormal results of liver function studies: Principal | ICD-10-CM

## 2016-01-18 LAB — HEPATIC FUNCTION PANEL
ALK PHOS: 53 U/L (ref 39–117)
ALT: 42 U/L — AB (ref 0–35)
AST: 57 U/L — AB (ref 0–37)
Albumin: 4.3 g/dL (ref 3.5–5.2)
BILIRUBIN DIRECT: 0.2 mg/dL (ref 0.0–0.3)
TOTAL PROTEIN: 7.2 g/dL (ref 6.0–8.3)
Total Bilirubin: 0.8 mg/dL (ref 0.2–1.2)

## 2016-01-18 LAB — CBC WITH DIFFERENTIAL/PLATELET
BASOS ABS: 0 10*3/uL (ref 0.0–0.1)
Basophils Relative: 0.4 % (ref 0.0–3.0)
EOS ABS: 0.1 10*3/uL (ref 0.0–0.7)
Eosinophils Relative: 1.9 % (ref 0.0–5.0)
HEMATOCRIT: 38.3 % (ref 36.0–46.0)
HEMOGLOBIN: 13.1 g/dL (ref 12.0–15.0)
LYMPHS PCT: 21.1 % (ref 12.0–46.0)
Lymphs Abs: 1.1 10*3/uL (ref 0.7–4.0)
MCHC: 34.1 g/dL (ref 30.0–36.0)
MCV: 92.1 fl (ref 78.0–100.0)
Monocytes Absolute: 0.5 10*3/uL (ref 0.1–1.0)
Monocytes Relative: 8.8 % (ref 3.0–12.0)
Neutro Abs: 3.5 10*3/uL (ref 1.4–7.7)
Neutrophils Relative %: 67.8 % (ref 43.0–77.0)
PLATELETS: 240 10*3/uL (ref 150.0–400.0)
RBC: 4.17 Mil/uL (ref 3.87–5.11)
RDW: 15.5 % (ref 11.5–15.5)
WBC: 5.2 10*3/uL (ref 4.0–10.5)

## 2016-01-22 ENCOUNTER — Other Ambulatory Visit: Payer: Self-pay

## 2016-01-22 DIAGNOSIS — Z7960 Long term (current) use of unspecified immunomodulators and immunosuppressants: Secondary | ICD-10-CM

## 2016-01-22 DIAGNOSIS — Z79899 Other long term (current) drug therapy: Secondary | ICD-10-CM

## 2016-02-09 ENCOUNTER — Other Ambulatory Visit: Payer: Self-pay

## 2016-02-09 NOTE — Telephone Encounter (Signed)
Received faxed request for refill. Last OV 12/04/15 w/ f/u scheduled 06/06/16. Last filled w/ refills for 6 mos on 08/07/15.

## 2016-02-12 MED ORDER — ALPRAZOLAM 1 MG PO TABS: 1.0000 mg | ORAL_TABLET | Freq: Every day | ORAL | Status: DC

## 2016-02-12 NOTE — Telephone Encounter (Signed)
Rx printed, signed, faxed to pharmacy. 

## 2016-02-21 ENCOUNTER — Telehealth: Payer: Self-pay | Admitting: *Deleted

## 2016-02-21 NOTE — Telephone Encounter (Signed)
Message For: [facility]                  Taken 24-MAY-17 at  8:19AM by JPO ------------------------------------------------------------ Daneen Schick            CID [id number]  Patient SAME                 Pt's Dr Jannifer Franklin       Area Code 336 Phone# [phone]      RE NEEDS TO SCHEDULE APPT                                                                                 Disp:Y/N N If Y = C/B If No Response In 55minutes ============================================================

## 2016-02-22 ENCOUNTER — Ambulatory Visit (INDEPENDENT_AMBULATORY_CARE_PROVIDER_SITE_OTHER): Payer: BLUE CROSS/BLUE SHIELD | Admitting: Neurology

## 2016-02-22 ENCOUNTER — Ambulatory Visit: Payer: BLUE CROSS/BLUE SHIELD

## 2016-02-22 ENCOUNTER — Encounter: Payer: Self-pay | Admitting: Neurology

## 2016-02-22 VITALS — BP 122/84 | HR 86 | Ht 64.0 in | Wt 195.0 lb

## 2016-02-22 DIAGNOSIS — R29898 Other symptoms and signs involving the musculoskeletal system: Secondary | ICD-10-CM | POA: Diagnosis not present

## 2016-02-22 DIAGNOSIS — G35 Multiple sclerosis: Secondary | ICD-10-CM

## 2016-02-22 DIAGNOSIS — R269 Unspecified abnormalities of gait and mobility: Secondary | ICD-10-CM

## 2016-02-22 DIAGNOSIS — Z5181 Encounter for therapeutic drug level monitoring: Secondary | ICD-10-CM | POA: Diagnosis not present

## 2016-02-22 MED ORDER — PREDNISONE 10 MG PO TABS: ORAL_TABLET | ORAL | Status: DC

## 2016-02-22 NOTE — Progress Notes (Signed)
Reason for visit: Multiple sclerosis  Gabriela Rice is an 60 y.o. female  History of present illness:  Gabriela Rice is a 61 year old right-handed white female with a history of multiple sclerosis. The patient is on Copaxone, and she also is on Imuran for autoimmune hepatitis. The patient comes in today with onset of some difficulty with the left leg with pain in the anterolateral aspect of the leg below the knee that is tender to light touch, the patient has also had some increased weakness in the left leg. She has had difficulty walking over the last couple days. She denies back pain. She has stiffness in both legs that has not been benefited with baclofen. She has not had any recent falls, she can walk with a walker in the home environment. She last had MRI of the brain and cervical spine in August 2015. She likely had an exacerbation of her MS in the fall of 2016. She comes into the office today for an evaluation of her new symptoms.  Past Medical History  Diagnosis Date  . DM (diabetes mellitus) ([place])   . Hyperlipidemia   . Multiple sclerosis ([place])   . Allergic rhinitis   . Elevated LFTs   . Obesity   . Vitamin D deficiency   . Abnormality of gait 02/16/2013  . Colon polyps   . Diverticulosis   . Hemorrhoids   . Hepatitis     with cholestasis  . Hemorrhoids   . Autoimmune hepatitis ([place])   . Allergy   . Hypertension   . Autoimmune hepatitis ([place])   . Anxiety   . Migraines     Past Surgical History  Procedure Laterality Date  . Myoectomy    . Cesarean section      x 2  . Tonsillectomy and adenoidectomy    . Abdominal hysterectomy      Family History  Problem Relation Age of Onset  . Heart disease Maternal Grandmother   . Colon cancer Maternal Aunt   . Uterine cancer Maternal Aunt   . Brain cancer Maternal Uncle   . Diabetes Mother   . Multiple sclerosis Mother   . Cirrhosis Father   . Colon cancer Maternal Uncle     Social history:  reports that she  quit smoking about 20 years ago. Her smoking use included Cigarettes. She has a 1 pack-year smoking history. She has never used smokeless tobacco. She reports that she does not drink alcohol or use illicit drugs.    Allergies  Allergen Reactions  . Penicillins Other (See Comments)    seizure as child Has patient had a PCN reaction causing immediate rash, facial/tongue/throat swelling, SOB or lightheadedness with hypotension: No Has patient had a PCN reaction causing severe rash involving mucus membranes or skin necrosis:No Has patient had a PCN reaction that required hospitalizationNo Has patient had a PCN reaction occurring within the last 10 years:No If all of the above answers are "NO", then may proceed with Cephalosporin use.    . Codeine Swelling    hands swell    Medications:  Prior to Admission medications   Medication Sig Start Date End Date Taking? Authorizing Provider  ALPRAZolam Duanne Moron) 1 MG tablet Take 1 tablet (1 mg total) by mouth at bedtime. 02/12/16  Yes Kathrynn Ducking, MD  aspirin EC 81 MG tablet Take 81 mg by mouth daily.   Yes Historical Provider, MD  azaTHIOprine (IMURAN) 50 MG tablet Take 3 tablets (150 mg total) by mouth  daily. 09/29/15  Yes Manus Gunning, MD  azaTHIOprine (IMURAN) 50 MG tablet Take 3 tab at the same time daily. 10/23/15  Yes Irene Shipper, MD  baclofen (LIORESAL) 10 MG tablet Take 1 tablet (10 mg total) by mouth 3 (three) times daily. 01/15/16  Yes Kathrynn Ducking, MD  cetirizine (ZYRTEC) 10 MG tablet Take 10 mg by mouth daily.   Yes Historical Provider, MD  Cholecalciferol (VITAMIN D) 2000 UNITS CAPS Take 2,000 Units by mouth daily.    Yes Historical Provider, MD  COPAXONE 40 MG/ML SOSY INJECT 40 MG (1 ML) UNDER THE SKIN THREE TIMES A WEEK 12/27/15  Yes Kathrynn Ducking, MD  escitalopram (LEXAPRO) 10 MG tablet Take 10 mg by mouth daily. 08/14/15  Yes Historical Provider, MD  gabapentin (NEURONTIN) 300 MG capsule Take 1 capsule (300 mg  total) by mouth 3 (three) times daily. 12/04/15  Yes Kathrynn Ducking, MD  ibuprofen (ADVIL,MOTRIN) 200 MG tablet Take 200 mg by mouth every 6 (six) hours as needed for pain.   Yes Historical Provider, MD  losartan (COZAAR) 100 MG tablet Take 100 mg by mouth daily.  09/02/13  Yes Historical Provider, MD  metFORMIN (GLUCOPHAGE) 500 MG tablet Take 500 mg by mouth 2 (two) times daily with a meal.  03/15/13  Yes Historical Provider, MD  ondansetron (ZOFRAN) 4 MG tablet take 1 tablet every 8 hours if needed for nausea   Yes Irene Shipper, MD  polyethylene glycol powder [facility]) powder Take one scoop in 8 oz of water daily 09/07/15  Yes Ivan Anchors Love, PA-C  pravastatin (PRAVACHOL) 40 MG tablet Take 40 mg by mouth daily.  07/04/14  Yes Historical Provider, MD  traMADol (ULTRAM) 50 MG tablet take 1 to 2 tablets by mouth every 6 hours for if needed for pain 09/21/15  Yes Historical Provider, MD  trimethoprim (TRIMPEX) 100 MG tablet Take 100 mg by mouth daily. 11/09/13  Yes Historical Provider, MD    ROS:  Out of a complete 14 system review of symptoms, the patient complains only of the following symptoms, and all other reviewed systems are negative.  Decreased activity, fatigue Runny nose Eye itching, loss of vision Swollen abdomen, abdominal pain, constipation Insomnia Difficulty urinating, incontinence of bladder Joint pain, back pain, walking difficulty Dizziness, numbness Anxiety  Blood pressure 122/84, pulse 86, height 5\' 4"  (1.626 m), weight 195 lb (88.451 kg).  Physical Exam  General: The patient is alert and cooperative at the time of the examination. The patient is markedly obese.  Skin: 2+ edema below the knees is noted bilaterally.   Neurologic Exam  Mental status: The patient is alert and oriented x 3 at the time of the examination. The patient has apparent normal recent and remote memory, with an apparently normal attention span and concentration ability.   Cranial  nerves: Facial symmetry is present. Speech is normal, no aphasia or dysarthria is noted. Extraocular movements are full. Visual fields are full. Pupils are equal, round, and reactive to light. Discs are flat bilaterally.  Motor: The patient has good strength in the upper extremities. With the lower extremity, the patient has 2/5 strength with hip flexion bilaterally, prominent footdrop is seen bilaterally, 4/5 strength with knee flexion extension is seen bilaterally.  Sensory examination: Soft touch sensation is symmetric on the face, arms, and legs.  Coordination: The patient has good finger-nose-finger bilaterally. The patient is not able to perform heel-to-shin on either side.  Gait and station: The patient  can stand with some assistance, she is unable to ambulate. She has a wide-based stance.  Reflexes: Deep tendon reflexes are symmetric.   Assessment/Plan:  1. Multiple sclerosis, recent exacerbation  2. Paraparesis  3. Gait disorder  4. Autoimmune hepatitis  The patient remains on Copaxone and Imuran. The patient has had some worsening of discomfort and weakness of the left leg. She is not able to ambulate well at this time. We will initiate a four-day course of Solu-Medrol, and get physical therapy in the home environment. The patient will start Solu-Medrol today. She will go on a prednisone taper after the Solu-Medrol. The patient has a revisit in September. We may consider another therapy for the multiple sclerosis. I have discussed this with Dr. Felecia Shelling, may consider the possibility of Rituxan in place of the Imuran and Copaxone in treating the MS. Blood work will be done today.  Jill Alexanders MD 02/22/2016 6:36 PM  [facility] [address] [place], [place] [address]  Phone [phone] Fax [phone]

## 2016-02-23 ENCOUNTER — Telehealth: Payer: Self-pay | Admitting: *Deleted

## 2016-02-23 LAB — CBC WITH DIFFERENTIAL/PLATELET
Basophils Absolute: 0 10*3/uL (ref 0.0–0.2)
Basos: 0 %
EOS (ABSOLUTE): 0.2 10*3/uL (ref 0.0–0.4)
Eos: 4 %
HEMOGLOBIN: 13.4 g/dL (ref 11.1–15.9)
Hematocrit: 37.3 % (ref 34.0–46.6)
IMMATURE GRANS (ABS): 0 10*3/uL (ref 0.0–0.1)
Immature Granulocytes: 0 %
LYMPHS: 22 %
Lymphocytes Absolute: 1.2 10*3/uL (ref 0.7–3.1)
MCH: 31.8 pg (ref 26.6–33.0)
MCHC: 35.9 g/dL — ABNORMAL HIGH (ref 31.5–35.7)
MCV: 89 fL (ref 79–97)
MONOCYTES: 8 %
Monocytes Absolute: 0.4 10*3/uL (ref 0.1–0.9)
NEUTROS PCT: 66 %
Neutrophils Absolute: 3.6 10*3/uL (ref 1.4–7.0)
Platelets: 260 10*3/uL (ref 150–379)
RBC: 4.21 x10E6/uL (ref 3.77–5.28)
RDW: 14.4 % (ref 12.3–15.4)
WBC: 5.5 10*3/uL (ref 3.4–10.8)

## 2016-02-23 LAB — COMPREHENSIVE METABOLIC PANEL
ALK PHOS: 59 IU/L (ref 39–117)
ALT: 44 IU/L — AB (ref 0–32)
AST: 69 IU/L — ABNORMAL HIGH (ref 0–40)
Albumin/Globulin Ratio: 1.7 (ref 1.2–2.2)
Albumin: 4.5 g/dL (ref 3.5–5.5)
BILIRUBIN TOTAL: 0.8 mg/dL (ref 0.0–1.2)
BUN/Creatinine Ratio: 24 — ABNORMAL HIGH (ref 9–23)
BUN: 20 mg/dL (ref 6–24)
CHLORIDE: 100 mmol/L (ref 96–106)
CO2: 22 mmol/L (ref 18–29)
Calcium: 9.7 mg/dL (ref 8.7–10.2)
Creatinine, Ser: 0.85 mg/dL (ref 0.57–1.00)
GFR calc Af Amer: 87 mL/min/{1.73_m2} (ref >59)
GFR calc non Af Amer: 75 mL/min/{1.73_m2} (ref >59)
GLUCOSE: 112 mg/dL — AB (ref 65–99)
Globulin, Total: 2.6 g/dL (ref 1.5–4.5)
Potassium: 4.4 mmol/L (ref 3.5–5.2)
Sodium: 141 mmol/L (ref 134–144)
TOTAL PROTEIN: 7.1 g/dL (ref 6.0–8.5)

## 2016-02-23 NOTE — Telephone Encounter (Signed)
Message For: OFFICE               Taken 26-MAY-17 at  9:20AM by DEF ------------------------------------------------------------ Caller AMY / PHARMACIST           CID [id number]  Patient Gabriela Rice      Pt's Dr Jannifer Franklin       Area Code [id number]      RE ?? ON DOSING-                                                                                          Disp:Y/N   If Y = C/B If No Response In 37minutes ============================================================

## 2016-02-23 NOTE — Telephone Encounter (Signed)
Rn call Amy pharmacist at [phone] about the  Solu medrol dose. Amy stated they receive a form from our office with Dr. Eugenie Birks signature. Amy stated she wanted clarification on the dosage and it looks like a 8 but cannot make it out. Rn stated there was no dosage in the office notes. Rn stated a message will be sent to Dr. Jannifer Franklin to clarify dose. Amy verbalized understanding.

## 2016-02-23 NOTE — Telephone Encounter (Signed)
Returned call to pharmacist, Amy. Clarified Solumedrol dose - 500 mg IV x 3 days. Voiced understanding and appreciation for call.

## 2016-03-01 ENCOUNTER — Other Ambulatory Visit: Payer: Self-pay | Admitting: Family Medicine

## 2016-03-01 ENCOUNTER — Ambulatory Visit
Admission: RE | Admit: 2016-03-01 | Discharge: 2016-03-01 | Disposition: A | Discharge status: Home / Self Care | Payer: BLUE CROSS/BLUE SHIELD | Source: Ambulatory Visit

## 2016-03-01 DIAGNOSIS — Z1231 Encounter for screening mammogram for malignant neoplasm of breast: Secondary | ICD-10-CM

## 2016-03-04 ENCOUNTER — Encounter: Payer: Self-pay | Admitting: Neurology

## 2016-03-04 ENCOUNTER — Telehealth: Payer: Self-pay | Admitting: Neurology

## 2016-03-04 DIAGNOSIS — M25572 Pain in left ankle and joints of left foot: Secondary | ICD-10-CM

## 2016-03-04 NOTE — Telephone Encounter (Signed)
I called patient. The patient had one session of physical therapy, the next day she began having left ankle pain. There is no swelling or bruising or redness. The pain is with weightbearing, she has no pain off of her feet, the pain comes on immediately when she bears weight. She can move the ankle without weightbearing with minimal discomfort. The patient likely has a ligament or tendon injury, I'll get an x-ray of the left ankle. I do not think this is directly related to her multiple sclerosis.

## 2016-03-05 ENCOUNTER — Telehealth: Payer: Self-pay | Admitting: Neurology

## 2016-03-05 ENCOUNTER — Other Ambulatory Visit: Payer: Self-pay | Admitting: Family Medicine

## 2016-03-05 ENCOUNTER — Ambulatory Visit (HOSPITAL_COMMUNITY)
Admission: RE | Admit: 2016-03-05 | Discharge: 2016-03-05 | Disposition: A | Discharge status: Home / Self Care | Payer: BLUE CROSS/BLUE SHIELD | Source: Ambulatory Visit | Attending: Neurology | Admitting: Neurology

## 2016-03-05 DIAGNOSIS — M25572 Pain in left ankle and joints of left foot: Secondary | ICD-10-CM | POA: Insufficient documentation

## 2016-03-05 DIAGNOSIS — R928 Other abnormal and inconclusive findings on diagnostic imaging of breast: Secondary | ICD-10-CM

## 2016-03-05 NOTE — Telephone Encounter (Signed)
I called the patient. The x-ray of the left ankle looks okay. The patient has been off of her feet for about a week and the pain continues, very severe when bearing weight. I will get a referral to an orthopedic surgeon, suspect a ligamentous injury or arthritis issue.

## 2016-03-11 ENCOUNTER — Encounter: Payer: Self-pay | Admitting: Neurology

## 2016-03-12 ENCOUNTER — Ambulatory Visit
Admission: RE | Admit: 2016-03-12 | Discharge: 2016-03-12 | Disposition: A | Discharge status: Home / Self Care | Payer: BLUE CROSS/BLUE SHIELD | Source: Ambulatory Visit | Attending: Family Medicine | Admitting: Family Medicine

## 2016-03-12 DIAGNOSIS — R928 Other abnormal and inconclusive findings on diagnostic imaging of breast: Secondary | ICD-10-CM

## 2016-03-13 DIAGNOSIS — M25572 Pain in left ankle and joints of left foot: Secondary | ICD-10-CM | POA: Diagnosis not present

## 2016-03-15 ENCOUNTER — Telehealth: Payer: Self-pay | Admitting: Neurology

## 2016-03-15 NOTE — Telephone Encounter (Signed)
Simona Huh with [facility] is calling to advise that the patient has refused Physical Therapy.

## 2016-03-25 ENCOUNTER — Telehealth: Payer: Self-pay

## 2016-03-25 NOTE — Telephone Encounter (Signed)
-----   Message from Marlon Pel, RN sent at 01/22/2016 10:20 AM EDT ----- Needs labs- see results 4/21- Henrene Pastor Orders are in, please notify patient

## 2016-03-25 NOTE — Telephone Encounter (Signed)
Left message for pt to call back.  Spoke with pt and she is aware. 

## 2016-03-29 ENCOUNTER — Other Ambulatory Visit (INDEPENDENT_AMBULATORY_CARE_PROVIDER_SITE_OTHER): Payer: BLUE CROSS/BLUE SHIELD

## 2016-03-29 DIAGNOSIS — Z79899 Other long term (current) drug therapy: Secondary | ICD-10-CM | POA: Diagnosis not present

## 2016-03-29 DIAGNOSIS — Z7960 Long term (current) use of unspecified immunomodulators and immunosuppressants: Secondary | ICD-10-CM

## 2016-03-29 LAB — HEPATIC FUNCTION PANEL
ALBUMIN: 4.4 g/dL (ref 3.5–5.2)
ALK PHOS: 43 U/L (ref 39–117)
ALT: 31 U/L (ref 0–35)
AST: 37 U/L (ref 0–37)
BILIRUBIN TOTAL: 0.8 mg/dL (ref 0.2–1.2)
Bilirubin, Direct: 0.1 mg/dL (ref 0.0–0.3)
Total Protein: 6.9 g/dL (ref 6.0–8.3)

## 2016-04-01 ENCOUNTER — Other Ambulatory Visit: Payer: Self-pay | Admitting: *Deleted

## 2016-04-01 DIAGNOSIS — K754 Autoimmune hepatitis: Secondary | ICD-10-CM

## 2016-04-04 DIAGNOSIS — M25572 Pain in left ankle and joints of left foot: Secondary | ICD-10-CM | POA: Diagnosis not present

## 2016-04-10 ENCOUNTER — Encounter: Payer: Self-pay | Admitting: Internal Medicine

## 2016-04-16 DIAGNOSIS — M25572 Pain in left ankle and joints of left foot: Secondary | ICD-10-CM | POA: Diagnosis not present

## 2016-04-19 DIAGNOSIS — M25572 Pain in left ankle and joints of left foot: Secondary | ICD-10-CM | POA: Diagnosis not present

## 2016-05-01 ENCOUNTER — Other Ambulatory Visit: Payer: Self-pay | Admitting: Internal Medicine

## 2016-05-21 ENCOUNTER — Ambulatory Visit
Admission: RE | Admit: 2016-05-21 | Discharge: 2016-05-21 | Disposition: A | Discharge status: Home / Self Care | Payer: BLUE CROSS/BLUE SHIELD | Source: Ambulatory Visit | Attending: Neurology | Admitting: Neurology

## 2016-05-21 DIAGNOSIS — G35 Multiple sclerosis: Secondary | ICD-10-CM

## 2016-05-21 DIAGNOSIS — R269 Unspecified abnormalities of gait and mobility: Secondary | ICD-10-CM | POA: Diagnosis not present

## 2016-05-21 DIAGNOSIS — M50222 Other cervical disc displacement at C5-C6 level: Secondary | ICD-10-CM | POA: Diagnosis not present

## 2016-05-21 DIAGNOSIS — M50221 Other cervical disc displacement at C4-C5 level: Secondary | ICD-10-CM | POA: Diagnosis not present

## 2016-05-21 DIAGNOSIS — M50223 Other cervical disc displacement at C6-C7 level: Secondary | ICD-10-CM | POA: Diagnosis not present

## 2016-05-21 DIAGNOSIS — R29898 Other symptoms and signs involving the musculoskeletal system: Secondary | ICD-10-CM

## 2016-05-21 MED ORDER — GADOBENATE DIMEGLUMINE 529 MG/ML IV SOLN
20.0000 mL | Freq: Once | INTRAVENOUS | Status: AC | PRN
  Administered 2016-05-21: 20 mL via INTRAVENOUS

## 2016-05-22 ENCOUNTER — Telehealth: Payer: Self-pay | Admitting: Neurology

## 2016-05-22 NOTE — Telephone Encounter (Signed)
I called patient. The patient remains on Imuran. MRI the brain appears to be unchanged, MRI the cervical spine may have some mild progression in the spinal cord lesions, the patient has extensive plaque in the cervical cord. The patient had noted a change in walking, the left leg was a bit weaker. The spinal cord changes may be the source of this. We will see the patient in September.   MRI brain 05/22/16:  IMPRESSION:  Abnormal MRI brain (with and without) demonstrating: 1. Multiple periventricular, subcortical, juxtacortical and right cerebellar chronic demyelinating plaques. 2. No abnormal lesions are seen on post contrast views. 3. Compared to MRI on 05/11/14, no significant change.   MRI cervical 05/22/16:  IMPRESSION:  Abnormal MRI cervical spine (with and without) demonstrating: 1. Chronic demyelinating plaques from C2 to T1. No acute plaques. 2. At C3-4: disc bulging and uncovertebral joint hypertrophy with severe right foraminal stenosis  3. At C5-6: disc bulging and uncontrolled with severe right foraminal stenosis  4. Additional degenerative changes as above.  5. Compared to MRI on 05/11/14, the cervical spine chronic demyelinating disease is more prominent in the current study and may represent mild interval progression.

## 2016-06-06 ENCOUNTER — Encounter: Payer: Self-pay | Admitting: Neurology

## 2016-06-06 ENCOUNTER — Ambulatory Visit (INDEPENDENT_AMBULATORY_CARE_PROVIDER_SITE_OTHER): Payer: BLUE CROSS/BLUE SHIELD | Admitting: Neurology

## 2016-06-06 VITALS — BP 136/94 | HR 87 | Ht 64.0 in | Wt 195.0 lb

## 2016-06-06 DIAGNOSIS — G35 Multiple sclerosis: Secondary | ICD-10-CM

## 2016-06-06 DIAGNOSIS — Z5181 Encounter for therapeutic drug level monitoring: Secondary | ICD-10-CM

## 2016-06-06 MED ORDER — TRAMADOL HCL 50 MG PO TABS: ORAL_TABLET | ORAL | 0 refills | Status: DC

## 2016-06-06 NOTE — Progress Notes (Addendum)
Reason for visit: Multiple sclerosis  Gabriela Rice is an 60 y.o. female  History of present illness:  Gabriela Rice is a 60 year old right-handed white female with a history of multiple sclerosis. The patient has been treated with Copaxone and she is on Imuran for autoimmune hepatitis. The patient has had 2 episodes of MS attacks over the last 12 months. The last event occurred in May 2017. The patient has had some increased weakness of the left leg and some discomfort. The patient has undergone MRI evaluation of the cervical spine that show some progression of plaque in the spinal cord, MRI of the brain was relatively stable. The patient has done relatively well since that time, she did have some left ankle and foot pain related to arthritis. She is essentially wheelchair-bound, she will walk short distances with a walker in the home environment, she does report a fall that occurred in July. The patient reports no new numbness, weakness, vision changes since last seen in May. She returns for an evaluation.  Past Medical History:  Diagnosis Date  . Abnormality of gait 02/16/2013  . Allergic rhinitis   . Allergy   . Anxiety   . Autoimmune hepatitis ([place])   . Autoimmune hepatitis ([place])   . Colon polyps   . Diverticulosis   . DM (diabetes mellitus) ([place])   . Elevated LFTs   . Hemorrhoids   . Hemorrhoids   . Hepatitis    with cholestasis  . Hyperlipidemia   . Hypertension   . Migraines   . Multiple sclerosis ([place])   . Obesity   . Vitamin D deficiency     Past Surgical History:  Procedure Laterality Date  . ABDOMINAL HYSTERECTOMY    . CESAREAN SECTION     x 2  . MYOECTOMY    . TONSILLECTOMY AND ADENOIDECTOMY      Family History  Problem Relation Age of Onset  . Diabetes Mother   . Multiple sclerosis Mother   . Cirrhosis Father   . Heart disease Maternal Grandmother   . Colon cancer Maternal Aunt   . Uterine cancer Maternal Aunt   . Brain cancer Maternal Uncle   .  Colon cancer Maternal Uncle     Social history:  reports that she quit smoking about 20 years ago. Her smoking use included Cigarettes. She has a 1.00 pack-year smoking history. She has never used smokeless tobacco. She reports that she does not drink alcohol or use drugs.    Allergies  Allergen Reactions  . Penicillins Other (See Comments)    seizure as child Has patient had a PCN reaction causing immediate rash, facial/tongue/throat swelling, SOB or lightheadedness with hypotension: No Has patient had a PCN reaction causing severe rash involving mucus membranes or skin necrosis:No Has patient had a PCN reaction that required hospitalizationNo Has patient had a PCN reaction occurring within the last 10 years:No If all of the above answers are "NO", then may proceed with Cephalosporin use.    . Codeine Swelling    hands swell    Medications:  Prior to Admission medications   Medication Sig Start Date End Date Taking? Authorizing Provider  ALPRAZolam Duanne Moron) 1 MG tablet Take 1 tablet (1 mg total) by mouth at bedtime. 02/12/16  Yes Kathrynn Ducking, MD  aspirin EC 81 MG tablet Take 81 mg by mouth daily.   Yes Historical Provider, MD  azaTHIOprine (IMURAN) 50 MG tablet Take 3 tab at the same time daily. 10/23/15  Yes Irene Shipper, MD  baclofen (LIORESAL) 10 MG tablet Take 1 tablet (10 mg total) by mouth 3 (three) times daily. 01/15/16  Yes Kathrynn Ducking, MD  cetirizine (ZYRTEC) 10 MG tablet Take 10 mg by mouth daily.   Yes Historical Provider, MD  Cholecalciferol (VITAMIN D) 2000 UNITS CAPS Take 2,000 Units by mouth daily.    Yes Historical Provider, MD  COPAXONE 40 MG/ML SOSY INJECT 40 MG (1 ML) UNDER THE SKIN THREE TIMES A WEEK 12/27/15  Yes Kathrynn Ducking, MD  escitalopram (LEXAPRO) 10 MG tablet Take 10 mg by mouth daily. 08/14/15  Yes Historical Provider, MD  gabapentin (NEURONTIN) 300 MG capsule Take 1 capsule (300 mg total) by mouth 3 (three) times daily. 12/04/15  Yes Kathrynn Ducking, MD  ibuprofen (ADVIL,MOTRIN) 200 MG tablet Take 200 mg by mouth every 6 (six) hours as needed for pain.   Yes Historical Provider, MD  losartan (COZAAR) 100 MG tablet Take 100 mg by mouth daily.  09/02/13  Yes Historical Provider, MD  metFORMIN (GLUCOPHAGE) 500 MG tablet Take 500 mg by mouth 2 (two) times daily with a meal.  03/15/13  Yes Historical Provider, MD  ondansetron (ZOFRAN) 4 MG tablet take 1 tablet every 8 hours if needed for nausea   Yes Irene Shipper, MD  polyethylene glycol powder (GLYCOLAX/MIRALAX) powder Take one scoop in 8 oz of water daily 09/07/15  Yes Ivan Anchors Love, PA-C  polyethylene glycol powder (GLYCOLAX/MIRALAX) powder take 17GM (DISSOLVED IN WATER) by mouth three times a day 05/02/16  Yes Irene Shipper, MD  pravastatin (PRAVACHOL) 40 MG tablet Take 40 mg by mouth daily.  07/04/14  Yes Historical Provider, MD  predniSONE (DELTASONE) 10 MG tablet Begin taking 6 tablets daily, taper by one tablet every other day until off the medication. 02/22/16  Yes Kathrynn Ducking, MD  traMADol Veatrice Bourbon) 50 MG tablet take 1 to 2 tablets by mouth every 6 hours for if needed for pain 09/21/15  Yes Historical Provider, MD  trimethoprim (TRIMPEX) 100 MG tablet Take 100 mg by mouth daily. 11/09/13  Yes Historical Provider, MD    ROS:  Out of a complete 14 system review of symptoms, the patient complains only of the following symptoms, and all other reviewed systems are negative.  Fatigue Runny nose Light sensitivity Heat intolerance Swollen abdomen, constipation Insomnia, snoring Incontinence of the bladder Joint pain, back pain, walking difficulty, neck stiffness Dizziness, headache, numbness  Blood pressure (!) 136/94, pulse 87, height 5\' 4"  (1.626 m), weight 195 lb (88.5 kg).  Physical Exam  General: The patient is alert and cooperative at the time of the examination. The patient is moderately to markedly obese.  Skin: No significant peripheral edema is noted.   Neurologic  Exam  Mental status: The patient is alert and oriented x 3 at the time of the examination. The patient has apparent normal recent and remote memory, with an apparently normal attention span and concentration ability.   Cranial nerves: Facial symmetry is present. Speech is normal, no aphasia or dysarthria is noted. Extraocular movements are full. Visual fields are full.  Motor: The patient has good strength in all 4 extremities.  Sensory examination: Soft touch sensation is symmetric on the face, arms, and legs.  Coordination: The patient has good finger-nose-finger bilaterally. The patient is unable to perform heel-to-shin on either side.  Gait and station: The patient can stand, and she can take a step or 2 with assistance, she  is unable to walk unassisted.  Reflexes: Deep tendon reflexes are symmetric.   Assessment/Plan:  1. Multiple sclerosis  2. Paraparesis  3. Gait disorder  4. History of autoimmune hepatitis  The patient has had 2 exacerbations of her multiple sclerosis within the last 12 months, some evidence of progression on MRI with the cervical spine. The patient is on Imuran and Copaxone. Given the use of Imuran, the patient is limited with any other options with MS treatment. One potential option would be to discontinue the Copaxone and Imuran, consider rituximab to treat both entities. I will check some blood work today. I will review this issue with her gastroenterologist, if this seems to be a viable option for her autoimmune hepatitis as well, this could be a treatment option for this patient. Otherwise, she will follow-up in 6 months.  Jill Alexanders MD 06/06/2016 3:39 PM  [facility] [address] [place], [place] [address]  Phone [phone] Fax [phone]

## 2016-06-07 LAB — HEPATITIS B SURFACE ANTIGEN: Hepatitis B Surface Ag: NEGATIVE

## 2016-06-07 LAB — HEPATITIS C ANTIBODY: Hep C Virus Ab: <0.1 s/co ratio (ref 0.0–0.9)

## 2016-06-07 LAB — HEPATITIS B CORE ANTIBODY, TOTAL: HEP B C TOTAL AB: NEGATIVE

## 2016-06-08 ENCOUNTER — Telehealth: Payer: Self-pay | Admitting: Neurology

## 2016-06-08 NOTE — Telephone Encounter (Signed)
I called the patient. Dr. Henrene Pastor does not wish to alter current therapy for the autoimune hepatitis. Not sure we have very many options for this patient other than Copaxone. Betaseron in the past elevated the LFT's.

## 2016-06-10 LAB — QUANTIFERON IN TUBE
QFT TB AG MINUS NIL VALUE: 0.06 IU/mL
QUANTIFERON MITOGEN VALUE: 6.75 IU/mL
QUANTIFERON NIL VALUE: 0.15 [IU]/mL
QUANTIFERON TB AG VALUE: 0.21 IU/mL
QUANTIFERON TB GOLD: NEGATIVE

## 2016-06-10 LAB — QUANTIFERON TB GOLD ASSAY (BLOOD)

## 2016-06-12 ENCOUNTER — Encounter: Payer: Self-pay | Admitting: Neurology

## 2016-06-13 DIAGNOSIS — F419 Anxiety disorder, unspecified: Secondary | ICD-10-CM | POA: Diagnosis not present

## 2016-06-13 DIAGNOSIS — Z0000 Encounter for general adult medical examination without abnormal findings: Secondary | ICD-10-CM | POA: Diagnosis not present

## 2016-06-13 DIAGNOSIS — E782 Mixed hyperlipidemia: Secondary | ICD-10-CM | POA: Diagnosis not present

## 2016-06-13 DIAGNOSIS — Z23 Encounter for immunization: Secondary | ICD-10-CM | POA: Diagnosis not present

## 2016-06-13 DIAGNOSIS — I10 Essential (primary) hypertension: Secondary | ICD-10-CM | POA: Diagnosis not present

## 2016-06-13 DIAGNOSIS — Z7984 Long term (current) use of oral hypoglycemic drugs: Secondary | ICD-10-CM | POA: Diagnosis not present

## 2016-06-13 DIAGNOSIS — E559 Vitamin D deficiency, unspecified: Secondary | ICD-10-CM | POA: Diagnosis not present

## 2016-06-13 DIAGNOSIS — G35 Multiple sclerosis: Secondary | ICD-10-CM | POA: Diagnosis not present

## 2016-06-13 DIAGNOSIS — E119 Type 2 diabetes mellitus without complications: Secondary | ICD-10-CM | POA: Diagnosis not present

## 2016-06-14 ENCOUNTER — Encounter: Payer: Self-pay | Admitting: *Deleted

## 2016-06-14 ENCOUNTER — Telehealth: Payer: Self-pay | Admitting: Neurology

## 2016-06-14 NOTE — Telephone Encounter (Signed)
JC virus antibody level is negative.

## 2016-07-13 ENCOUNTER — Other Ambulatory Visit: Payer: Self-pay | Admitting: Neurology

## 2016-07-23 ENCOUNTER — Encounter: Payer: Self-pay | Admitting: Neurology

## 2016-07-23 ENCOUNTER — Other Ambulatory Visit: Payer: Self-pay | Admitting: Neurology

## 2016-07-23 DIAGNOSIS — G35 Multiple sclerosis: Secondary | ICD-10-CM

## 2016-07-23 DIAGNOSIS — Z5181 Encounter for therapeutic drug level monitoring: Secondary | ICD-10-CM

## 2016-07-23 MED ORDER — TRAMADOL HCL 50 MG PO TABS: ORAL_TABLET | ORAL | 1 refills | Status: DC

## 2016-07-29 ENCOUNTER — Telehealth: Payer: Self-pay | Admitting: Neurology

## 2016-07-29 DIAGNOSIS — G35 Multiple sclerosis: Secondary | ICD-10-CM

## 2016-07-29 DIAGNOSIS — Z5181 Encounter for therapeutic drug level monitoring: Secondary | ICD-10-CM

## 2016-07-29 DIAGNOSIS — G35D Multiple sclerosis, unspecified: Secondary | ICD-10-CM

## 2016-07-29 MED ORDER — TRAMADOL HCL 50 MG PO TABS: ORAL_TABLET | ORAL | 1 refills | Status: DC

## 2016-07-29 NOTE — Telephone Encounter (Signed)
[facility]

## 2016-07-29 NOTE — Telephone Encounter (Signed)
Tramadol 90 day rx faxed to [facility] as requested.

## 2016-08-08 ENCOUNTER — Other Ambulatory Visit: Payer: Self-pay | Admitting: Neurology

## 2016-08-08 ENCOUNTER — Other Ambulatory Visit: Payer: Self-pay | Admitting: *Deleted

## 2016-08-08 MED ORDER — BACLOFEN 10 MG PO TABS: 10.0000 mg | ORAL_TABLET | Freq: Three times a day (TID) | ORAL | 1 refills | Status: DC

## 2016-08-08 NOTE — Telephone Encounter (Signed)
I LM on [facility] refilled [facility] and they could cancel the monthy refills that she has there.  If questions to call us back.

## 2016-08-18 ENCOUNTER — Other Ambulatory Visit: Payer: Self-pay | Admitting: Neurology

## 2016-08-19 NOTE — Telephone Encounter (Signed)
Rx printed, signed, faxed to pharmacy. 

## 2016-09-02 ENCOUNTER — Encounter: Payer: Self-pay | Admitting: Neurology

## 2016-09-04 ENCOUNTER — Encounter: Payer: Self-pay | Admitting: Neurology

## 2016-09-13 NOTE — Telephone Encounter (Signed)
Patient is calling back regarding patient assistance with her medication. She has not received a call.

## 2016-10-14 ENCOUNTER — Encounter: Payer: Self-pay | Admitting: Neurology

## 2016-10-15 ENCOUNTER — Other Ambulatory Visit: Payer: Self-pay

## 2016-10-15 MED ORDER — COPAXONE 40 MG/ML ~~LOC~~ SOSY: PREFILLED_SYRINGE | SUBCUTANEOUS | 11 refills | Status: DC

## 2016-10-15 NOTE — Telephone Encounter (Signed)
New rx sent to [facility] so that insurance coverage/referral can be processed per faxed request from specialty pharmacy.

## 2016-10-18 ENCOUNTER — Telehealth: Payer: Self-pay

## 2016-10-18 MED ORDER — COPAXONE 40 MG/ML ~~LOC~~ SOSY: PREFILLED_SYRINGE | SUBCUTANEOUS | 11 refills | Status: DC

## 2016-10-18 NOTE — Telephone Encounter (Signed)
Rx resent to corrected pharmacy  

## 2016-10-23 NOTE — Telephone Encounter (Signed)
We will continue the Copaxone for now.

## 2016-10-23 NOTE — Telephone Encounter (Signed)
Contacted [facility] T # [id number] to initate PA for Copaxone 40 mg/ml per faxed request.

## 2016-10-25 NOTE — Telephone Encounter (Signed)
T[facility] Must # [id number] Copaxone PA approved from 10/23/2016 through 09/29/2017 per fax from [facility] T # [phone] F # [phone]. Approval faxed to [place].

## 2016-10-30 ENCOUNTER — Other Ambulatory Visit: Payer: Self-pay | Admitting: Neurology

## 2016-10-30 ENCOUNTER — Encounter: Payer: Self-pay | Admitting: Neurology

## 2016-10-30 DIAGNOSIS — R269 Unspecified abnormalities of gait and mobility: Secondary | ICD-10-CM

## 2016-10-30 DIAGNOSIS — G35 Multiple sclerosis: Secondary | ICD-10-CM

## 2016-11-04 ENCOUNTER — Telehealth: Payer: Self-pay | Admitting: Internal Medicine

## 2016-11-04 ENCOUNTER — Encounter: Payer: Self-pay | Admitting: Neurology

## 2016-11-04 ENCOUNTER — Other Ambulatory Visit: Payer: Self-pay | Admitting: *Deleted

## 2016-11-04 MED ORDER — BACLOFEN 10 MG PO TABS: 10.0000 mg | ORAL_TABLET | Freq: Three times a day (TID) | ORAL | 1 refills | Status: DC

## 2016-11-05 MED ORDER — AZATHIOPRINE 50 MG PO TABS: ORAL_TABLET | ORAL | 3 refills | Status: DC

## 2016-11-05 NOTE — Telephone Encounter (Signed)
Refilled Imuran

## 2016-12-04 ENCOUNTER — Ambulatory Visit: Payer: Medicare Other | Admitting: Neurology

## 2016-12-05 DIAGNOSIS — E782 Mixed hyperlipidemia: Secondary | ICD-10-CM | POA: Diagnosis not present

## 2016-12-05 DIAGNOSIS — R300 Dysuria: Secondary | ICD-10-CM | POA: Diagnosis not present

## 2016-12-05 DIAGNOSIS — E559 Vitamin D deficiency, unspecified: Secondary | ICD-10-CM | POA: Diagnosis not present

## 2016-12-05 DIAGNOSIS — G35 Multiple sclerosis: Secondary | ICD-10-CM | POA: Diagnosis not present

## 2016-12-05 DIAGNOSIS — I10 Essential (primary) hypertension: Secondary | ICD-10-CM | POA: Diagnosis not present

## 2016-12-05 DIAGNOSIS — Z7984 Long term (current) use of oral hypoglycemic drugs: Secondary | ICD-10-CM | POA: Diagnosis not present

## 2016-12-05 DIAGNOSIS — E119 Type 2 diabetes mellitus without complications: Secondary | ICD-10-CM | POA: Diagnosis not present

## 2016-12-25 ENCOUNTER — Telehealth: Payer: Self-pay

## 2016-12-25 NOTE — Telephone Encounter (Signed)
Submitted prior authorization request to Cover My Meds for Azathioprine.  Came back that authorization not required

## 2017-01-01 ENCOUNTER — Other Ambulatory Visit (INDEPENDENT_AMBULATORY_CARE_PROVIDER_SITE_OTHER): Payer: PPO

## 2017-01-01 DIAGNOSIS — R109 Unspecified abdominal pain: Secondary | ICD-10-CM | POA: Diagnosis not present

## 2017-01-01 DIAGNOSIS — N3020 Other chronic cystitis without hematuria: Secondary | ICD-10-CM | POA: Diagnosis not present

## 2017-01-01 DIAGNOSIS — K754 Autoimmune hepatitis: Secondary | ICD-10-CM

## 2017-01-01 LAB — HEPATIC FUNCTION PANEL
ALBUMIN: 4.5 g/dL (ref 3.5–5.2)
ALK PHOS: 54 U/L (ref 39–117)
ALT: 31 U/L (ref 0–35)
AST: 41 U/L — ABNORMAL HIGH (ref 0–37)
Bilirubin, Direct: 0.1 mg/dL (ref 0.0–0.3)
TOTAL PROTEIN: 7.4 g/dL (ref 6.0–8.3)
Total Bilirubin: 0.6 mg/dL (ref 0.2–1.2)

## 2017-01-02 ENCOUNTER — Other Ambulatory Visit: Payer: Self-pay

## 2017-01-02 DIAGNOSIS — R7989 Other specified abnormal findings of blood chemistry: Secondary | ICD-10-CM

## 2017-01-02 DIAGNOSIS — R945 Abnormal results of liver function studies: Principal | ICD-10-CM

## 2017-01-14 ENCOUNTER — Other Ambulatory Visit: Payer: Self-pay | Admitting: Neurology

## 2017-01-14 ENCOUNTER — Encounter: Payer: Self-pay | Admitting: Neurology

## 2017-01-14 DIAGNOSIS — Z5181 Encounter for therapeutic drug level monitoring: Secondary | ICD-10-CM

## 2017-01-14 DIAGNOSIS — G35 Multiple sclerosis: Secondary | ICD-10-CM

## 2017-01-14 MED ORDER — TRAMADOL HCL 50 MG PO TABS: ORAL_TABLET | ORAL | 1 refills | Status: DC

## 2017-01-14 MED ORDER — GABAPENTIN 300 MG PO CAPS: 300.0000 mg | ORAL_CAPSULE | Freq: Three times a day (TID) | ORAL | 2 refills | Status: DC

## 2017-01-14 NOTE — Telephone Encounter (Signed)
Faxed printed/signed rx tramadol to requested pharmacy. Fax: [phone]. Received confirmation.

## 2017-02-07 DIAGNOSIS — N3020 Other chronic cystitis without hematuria: Secondary | ICD-10-CM | POA: Diagnosis not present

## 2017-02-07 DIAGNOSIS — N310 Uninhibited neuropathic bladder, not elsewhere classified: Secondary | ICD-10-CM | POA: Diagnosis not present

## 2017-02-07 DIAGNOSIS — N3946 Mixed incontinence: Secondary | ICD-10-CM | POA: Diagnosis not present

## 2017-02-10 ENCOUNTER — Encounter: Payer: Self-pay | Admitting: Neurology

## 2017-02-11 ENCOUNTER — Telehealth: Payer: Self-pay | Admitting: Neurology

## 2017-02-11 NOTE — Telephone Encounter (Signed)
As mentioned below, insurance will accept the generic form. I called the patient she states that she has never tried generic. Ok to try or would you like to try and get brand name. (If so, I need the reason to give the insurance company)

## 2017-02-11 NOTE — Telephone Encounter (Signed)
Form mentioned below received. I faxed it back with this information below.

## 2017-02-11 NOTE — Telephone Encounter (Addendum)
Pt approve for generic form of copaxone 40mg  syringe from 5/1/52018 to 09/29/2017, per fax from [facility]. COntact number is [phone].

## 2017-02-11 NOTE — Telephone Encounter (Signed)
LM on number below that patient can have generic form. Left call back number for any further questions.

## 2017-02-11 NOTE — Telephone Encounter (Signed)
I have no problem with the patient going to generic Copaxone.

## 2017-02-11 NOTE — Telephone Encounter (Signed)
Donna/[facility] [phone] called wanting to know if pt can have COPAXONE 40 MG/ML SOSY ,needing PA for brand. They already have PA for generic has already been approved in January 2018 thru the end of the year. She said it need to be approved within 1 hour. Her direct # [phone] with secure [employer]. Please call

## 2017-03-04 ENCOUNTER — Telehealth: Payer: Self-pay | Admitting: Neurology

## 2017-03-04 NOTE — Telephone Encounter (Signed)
I agree with assessment, the patient will need to be evaluated to make sure that there are no other medical issues that have resulted in worsening of functional level.

## 2017-03-04 NOTE — Telephone Encounter (Signed)
Tried calling patient back. LVM for her to call.

## 2017-03-04 NOTE — Telephone Encounter (Addendum)
Called and spoke to patient. Advised it would be best if she came in for appt to see Dr Jannifer Franklin so he can evaluate her in the office. Scheduled appt for tomorrow at 12pm, check in 1145am.  Patient stated at first "I do not feel like coming in". I recommended she come in to be seen.  Advised I will send to CW,MD and we will call back if he would like to proceed differently. She verbalized understanding.   Spoke with WID, Dr Krista Blue to make sure this was appropriate. Dr Krista Blue agreed patient should come in for appt.

## 2017-03-04 NOTE — Telephone Encounter (Signed)
Patient called office back. She has been very weak. Right leg is very stiff. Earlier, she went to sit on side of bed, she fell to floor. She could not get back up and had to call EMT because her husband cannot help. She refused to go to the hospital. BP okay per patient. No other symptoms.    She states she started getting weaker this past month. Been getting progressively worse.  She is requesting IV infusion, steroids at home. She thinks it was [facility] that came out before, she is not sure. She is still taking copaxone.   Her husband is with her. Uses a walker while ambulating. I advised to go to ED if she has any more falls. I advised CW,MD out of office right now. She is okay to wait until he is back. I offered to send message to [facility].

## 2017-03-04 NOTE — Telephone Encounter (Signed)
Patient fell about an hour ago because her legs keep locking up. She had to call  EMT to help her get up. She is very weak and wants to have someone come out and give her an IV drip for her MS.

## 2017-03-05 ENCOUNTER — Ambulatory Visit (INDEPENDENT_AMBULATORY_CARE_PROVIDER_SITE_OTHER): Payer: PPO | Admitting: Neurology

## 2017-03-05 ENCOUNTER — Encounter: Payer: Self-pay | Admitting: Neurology

## 2017-03-05 VITALS — BP 160/92 | HR 98 | Ht 64.0 in

## 2017-03-05 DIAGNOSIS — G35 Multiple sclerosis: Secondary | ICD-10-CM

## 2017-03-05 DIAGNOSIS — Z5181 Encounter for therapeutic drug level monitoring: Secondary | ICD-10-CM | POA: Diagnosis not present

## 2017-03-05 DIAGNOSIS — R29898 Other symptoms and signs involving the musculoskeletal system: Secondary | ICD-10-CM | POA: Diagnosis not present

## 2017-03-05 DIAGNOSIS — R269 Unspecified abnormalities of gait and mobility: Secondary | ICD-10-CM

## 2017-03-05 MED ORDER — PREDNISONE 10 MG PO TABS: ORAL_TABLET | ORAL | 0 refills | Status: DC

## 2017-03-05 NOTE — Progress Notes (Signed)
Reason for visit: Multiple sclerosis  Gabriela Rice is an 61 y.o. female  History of present illness:  Gabriela Rice is a 61 year old right-handed white female with a history of multiple sclerosis associated with a spastic paraparesis. The patient has had a decline in her ability to function over the last one month, particularly over the last 1 week. She has noted that the spasticity in the right upper leg has worsened. She has had a fall yesterday when she slid out of bed, her husband could not get her up and they had to call 911 to get her off of the floor. The patient has had some decline in her ability to ambulate. She is not sure whether or not she may have a bladder infection. She is very limited in her ability to ambulate anyway, she uses a walker for short distance walking inside the house. Otherwise, she uses a wheelchair outside the house. There have been no changes in sensation of the arms or legs, no visual changes or difficulty with speech or swallowing. The patient denies any fevers or chills. She is not having a cough. She comes to the office today for an evaluation.  Past Medical History:  Diagnosis Date  . Abnormality of gait 02/16/2013  . Allergic rhinitis   . Allergy   . Anxiety   . Autoimmune hepatitis ([place])   . Autoimmune hepatitis ([place])   . Colon polyps   . Diverticulosis   . DM (diabetes mellitus) ([place])   . Elevated LFTs   . Hemorrhoids   . Hemorrhoids   . Hepatitis    with cholestasis  . Hyperlipidemia   . Hypertension   . Migraines   . Multiple sclerosis ([place])   . Obesity   . Vitamin D deficiency     Past Surgical History:  Procedure Laterality Date  . ABDOMINAL HYSTERECTOMY    . CESAREAN SECTION     x 2  . MYOECTOMY    . TONSILLECTOMY AND ADENOIDECTOMY      Family History  Problem Relation Age of Onset  . Diabetes Mother   . Multiple sclerosis Mother   . Cirrhosis Father   . Heart disease Maternal Grandmother   . Colon cancer Maternal  Aunt   . Uterine cancer Maternal Aunt   . Brain cancer Maternal Uncle   . Colon cancer Maternal Uncle     Social history:  reports that she quit smoking about 21 years ago. Her smoking use included Cigarettes. She has a 1.00 pack-year smoking history. She has never used smokeless tobacco. She reports that she does not drink alcohol or use drugs.    Allergies  Allergen Reactions  . Penicillins Other (See Comments)    seizure as child Has patient had a PCN reaction causing immediate rash, facial/tongue/throat swelling, SOB or lightheadedness with hypotension: No Has patient had a PCN reaction causing severe rash involving mucus membranes or skin necrosis:No Has patient had a PCN reaction that required hospitalizationNo Has patient had a PCN reaction occurring within the last 10 years:No If all of the above answers are "NO", then may proceed with Cephalosporin use.    . Codeine Swelling    hands swell    Medications:  Prior to Admission medications   Medication Sig Start Date End Date Taking? Authorizing Provider  ALPRAZolam Duanne Moron) 1 MG tablet take 1 tablet by mouth at bedtime 08/19/16  Yes Kathrynn Ducking, MD  aspirin EC 81 MG tablet Take 81 mg by mouth  daily.   Yes [provider]  azaTHIOprine (IMURAN) 50 MG tablet Take 3 tab at the same time daily. 11/05/16  Yes Irene Shipper, MD  baclofen (LIORESAL) 10 MG tablet Take 1 tablet (10 mg total) by mouth 3 (three) times daily. 11/04/16  Yes Kathrynn Ducking, MD  cetirizine (ZYRTEC) 10 MG tablet Take 10 mg by mouth daily.   Yes [provider]  Cholecalciferol (VITAMIN D) 2000 UNITS CAPS Take 2,000 Units by mouth daily.    Yes [provider]  COPAXONE 40 MG/ML SOSY INJECT 40 MG (1 ML) UNDER THE SKIN THREE TIMES A WEEK 10/18/16  Yes Kathrynn Ducking, MD  escitalopram (LEXAPRO) 10 MG tablet Take 10 mg by mouth daily. 08/14/15  Yes [provider]  gabapentin (NEURONTIN) 300 MG capsule Take 1 capsule  (300 mg total) by mouth 3 (three) times daily. 01/14/17  Yes Kathrynn Ducking, MD  ibuprofen (ADVIL,MOTRIN) 200 MG tablet Take 200 mg by mouth every 6 (six) hours as needed for pain.   Yes [provider]  losartan (COZAAR) 100 MG tablet Take 100 mg by mouth daily.  09/02/13  Yes [provider]  metFORMIN (GLUCOPHAGE) 500 MG tablet Take 500 mg by mouth 2 (two) times daily with a meal.  03/15/13  Yes [provider]  polyethylene glycol powder (GLYCOLAX/MIRALAX) powder Take one scoop in 8 oz of water daily 09/07/15  Yes Love, Pamela S, PA-C  polyethylene glycol powder (GLYCOLAX/MIRALAX) powder take 17GM (DISSOLVED IN WATER) by mouth three times a day 05/02/16  Yes Irene Shipper, MD  pravastatin (PRAVACHOL) 40 MG tablet Take 40 mg by mouth daily.  07/04/14  Yes [provider]  predniSONE (DELTASONE) 10 MG tablet Begin taking 6 tablets daily, taper by one tablet every other day until off the medication. 02/22/16  Yes Kathrynn Ducking, MD  traMADol (ULTRAM) 50 MG tablet take 1 to 2 tablets by mouth every 6 hours for if needed for pain  Must last 90 days. 01/14/17  Yes Kathrynn Ducking, MD  trimethoprim (TRIMPEX) 100 MG tablet Take 100 mg by mouth daily. 11/09/13  Yes [provider]  predniSONE (DELTASONE) 10 MG tablet Begin taking 6 tablets daily, taper by one tablet every other day until off the medication. 03/05/17   Kathrynn Ducking, MD    ROS:  Out of a complete 14 system review of symptoms, the patient complains only of the following symptoms, and all other reviewed systems are negative.  Decreased activity, fatigue Runny nose Light sensitivity Leg swelling Heat intolerance Swollen abdomen, constipation Frequent waking Difficulty urinating, frequency of urination, urinary urgency Joint pain, back pain, walking difficulty, neck pain Numbness, weakness Depression, anxiety  Blood pressure (!) 160/92, pulse 98, height 5\' 4"  (1.626 m), SpO2 97  %.  Physical Exam  General: The patient is alert and cooperative at the time of the examination. The patient is moderately to markedly obese.  Skin: No significant peripheral edema is noted.   Neurologic Exam  Mental status: The patient is alert and oriented x 3 at the time of the examination. The patient has apparent normal recent and remote memory, with an apparently normal attention span and concentration ability.   Cranial nerves: Facial symmetry is present. Speech is normal, no aphasia or dysarthria is noted. Extraocular movements are full. Visual fields are full. Pupils are equal, round, and reactive to light. Discs are flat bilaterally.  Motor: The patient has good strength in the  upper extremities. With the lower extremities there is 4 minus/5 strength with hip flexion, 4+/5 strength with knee flexion and extension. Bilateral foot drops are noted, right greater than left.  Sensory examination: Soft touch sensation is symmetric on the face, arms, and legs.  Coordination: The patient has good finger-nose-finger bilaterally, the patient is unable to perform heel-to-shin on either side..  Gait and station: The patient is able to stand with assistance, she can bear weight for short period time, she cannot functionally ambulate.  Reflexes: Deep tendon reflexes are symmetric.   Assessment/Plan:  1. Multiple sclerosis with spastic paraparesis  The patient believes that there has been some change in her functional level over the last several weeks. The patient will be set up for a urinalysis today, blood work will be done, and she will be set up for home health nursing and physical therapy through [facility]. She will get 3 days of 500 mg per day of Solu-Medrol, then she will take a prednisone Dosepak, 10 mg 12 day pack. She will follow-up in 6 months, sooner if needed. The patient remains on Copaxone.   Jill Alexanders MD 03/05/2017 12:43 PM  [facility] [address] [place], [place] [address]  Phone [phone] Fax [phone]

## 2017-03-05 NOTE — Patient Instructions (Signed)
   We will get physical therapy and HH nursing out to the house.

## 2017-03-06 ENCOUNTER — Other Ambulatory Visit: Payer: Self-pay | Admitting: *Deleted

## 2017-03-06 ENCOUNTER — Telehealth: Payer: Self-pay | Admitting: Neurology

## 2017-03-06 DIAGNOSIS — R269 Unspecified abnormalities of gait and mobility: Secondary | ICD-10-CM

## 2017-03-06 DIAGNOSIS — G35 Multiple sclerosis: Secondary | ICD-10-CM

## 2017-03-06 LAB — CBC WITH DIFFERENTIAL/PLATELET
Basophils Absolute: 0 10*3/uL (ref 0.0–0.2)
Basos: 0 %
EOS (ABSOLUTE): 0.1 10*3/uL (ref 0.0–0.4)
EOS: 2 %
HEMATOCRIT: 36 % (ref 34.0–46.6)
Hemoglobin: 12.5 g/dL (ref 11.1–15.9)
IMMATURE GRANULOCYTES: 0 %
Immature Grans (Abs): 0 10*3/uL (ref 0.0–0.1)
Lymphocytes Absolute: 1 10*3/uL (ref 0.7–3.1)
Lymphs: 18 %
MCH: 32.6 pg (ref 26.6–33.0)
MCHC: 34.7 g/dL (ref 31.5–35.7)
MCV: 94 fL (ref 79–97)
MONOS ABS: 0.5 10*3/uL (ref 0.1–0.9)
Monocytes: 9 %
NEUTROS PCT: 71 %
Neutrophils Absolute: 3.9 10*3/uL (ref 1.4–7.0)
PLATELETS: 240 10*3/uL (ref 150–379)
RBC: 3.83 x10E6/uL (ref 3.77–5.28)
RDW: 16.2 % — AB (ref 12.3–15.4)
WBC: 5.5 10*3/uL (ref 3.4–10.8)

## 2017-03-06 LAB — COMPREHENSIVE METABOLIC PANEL WITH GFR
ALT: 35 [IU]/L — ABNORMAL HIGH (ref 0–32)
AST: 47 [IU]/L — ABNORMAL HIGH (ref 0–40)
Albumin/Globulin Ratio: 1.7 (ref 1.2–2.2)
Albumin: 4.5 g/dL (ref 3.6–4.8)
Alkaline Phosphatase: 64 [IU]/L (ref 39–117)
BUN/Creatinine Ratio: 15 (ref 12–28)
BUN: 14 mg/dL (ref 8–27)
Bilirubin Total: 0.5 mg/dL (ref 0.0–1.2)
CO2: 26 mmol/L (ref 18–29)
Calcium: 9.9 mg/dL (ref 8.7–10.3)
Chloride: 102 mmol/L (ref 96–106)
Creatinine, Ser: 0.95 mg/dL (ref 0.57–1.00)
GFR calc Af Amer: 75 mL/min/{1.73_m2}
GFR calc non Af Amer: 65 mL/min/{1.73_m2}
Globulin, Total: 2.6 g/dL (ref 1.5–4.5)
Glucose: 143 mg/dL — ABNORMAL HIGH (ref 65–99)
Potassium: 4.7 mmol/L (ref 3.5–5.2)
Sodium: 143 mmol/L (ref 134–144)
Total Protein: 7.1 g/dL (ref 6.0–8.5)

## 2017-03-06 NOTE — Telephone Encounter (Signed)
Adonis Brook called From [place] . Order for solumedrol IV needs to be added somewhere for insurance to approved. Pleat let me  know when done so I can call Christie back. Thanks Hinton Dyer.    Scheduling Instructions   Solumedrol 500 mg daily for 3 days RE: MS exacerbation  Physical therapy for leg strengthening exercises and gait training.

## 2017-03-06 NOTE — Telephone Encounter (Signed)
Noted. Done.

## 2017-03-06 NOTE — Telephone Encounter (Signed)
Placed orders in EPIC as requested. Hinton Dyer C aware. Will re-max to [facility].

## 2017-03-10 DIAGNOSIS — Z9181 History of falling: Secondary | ICD-10-CM | POA: Diagnosis not present

## 2017-03-10 DIAGNOSIS — G35 Multiple sclerosis: Secondary | ICD-10-CM | POA: Diagnosis not present

## 2017-03-10 DIAGNOSIS — R262 Difficulty in walking, not elsewhere classified: Secondary | ICD-10-CM | POA: Diagnosis not present

## 2017-03-11 DIAGNOSIS — Z5181 Encounter for therapeutic drug level monitoring: Secondary | ICD-10-CM | POA: Diagnosis not present

## 2017-03-11 DIAGNOSIS — G35 Multiple sclerosis: Secondary | ICD-10-CM | POA: Diagnosis not present

## 2017-03-11 DIAGNOSIS — R269 Unspecified abnormalities of gait and mobility: Secondary | ICD-10-CM | POA: Diagnosis not present

## 2017-03-12 ENCOUNTER — Telehealth: Payer: Self-pay | Admitting: Neurology

## 2017-03-12 LAB — URINALYSIS, ROUTINE W REFLEX MICROSCOPIC
Bilirubin, UA: NEGATIVE
NITRITE UA: NEGATIVE
RBC UA: NEGATIVE
UUROB: 0.2 mg/dL (ref 0.2–1.0)
pH, UA: 5 (ref 5.0–7.5)

## 2017-03-12 LAB — MICROSCOPIC EXAMINATION

## 2017-03-12 MED ORDER — CIPROFLOXACIN HCL 250 MG PO TABS: 250.0000 mg | ORAL_TABLET | Freq: Two times a day (BID) | ORAL | 0 refills | Status: DC

## 2017-03-12 NOTE — Telephone Encounter (Signed)
I called patient. The blood work shows a minimal elevation in liver enzymes, CBC is unremarkable, urinalysis reveals elevation in white blood cells and bacteria in the urine. The patient is on trimethoprim on a chronic basis, but she still gets urinary tract infections.  I will give her a seven-day course of Cipro.

## 2017-03-14 ENCOUNTER — Telehealth: Payer: Self-pay | Admitting: *Deleted

## 2017-03-14 NOTE — Telephone Encounter (Signed)
Faxed back signed order to [facility] for pt to receive generic copaxone. 3 month supply, 3 refills.  Fax: [phone]. Received confirmation.

## 2017-03-17 ENCOUNTER — Other Ambulatory Visit: Payer: Self-pay | Admitting: Neurology

## 2017-03-17 NOTE — Telephone Encounter (Signed)
Tried faxing printed rx x3. Failed all three times. Called and spoke with pharmacist, Estill Bamberg. She states their fax is down. She took VO for alprazolam 1mg  tablet 1 tablet po qhs prn. Qty 90, 1 refill.  Nothing further needed at this time.

## 2017-03-18 NOTE — Telephone Encounter (Signed)
Called and spoke with [facility]. They did not receive original fax. Re-faxed to [phone]. Received confirmation.

## 2017-03-25 ENCOUNTER — Ambulatory Visit: Payer: Medicare Other | Admitting: Neurology

## 2017-04-02 ENCOUNTER — Encounter: Payer: Self-pay | Admitting: Neurology

## 2017-04-03 ENCOUNTER — Ambulatory Visit: Payer: Medicare Other | Admitting: Neurology

## 2017-04-03 ENCOUNTER — Other Ambulatory Visit: Payer: Self-pay | Admitting: *Deleted

## 2017-04-03 MED ORDER — COPAXONE 40 MG/ML ~~LOC~~ SOSY: PREFILLED_SYRINGE | SUBCUTANEOUS | 11 refills | Status: DC

## 2017-04-07 ENCOUNTER — Telehealth: Payer: Self-pay | Admitting: Neurology

## 2017-04-07 NOTE — Telephone Encounter (Signed)
Mel with [facility] called and requested an order for the patients physical therapy to be continued in home health. Please call and advise.

## 2017-04-07 NOTE — Telephone Encounter (Signed)
Left message for Gabriela Rice at [facility] - provided verbal order, based on referral in Epic, for continued home health services.  Provided our fax number to send orders that may need to be signed.

## 2017-04-09 DIAGNOSIS — R262 Difficulty in walking, not elsewhere classified: Secondary | ICD-10-CM | POA: Diagnosis not present

## 2017-04-09 DIAGNOSIS — G35 Multiple sclerosis: Secondary | ICD-10-CM | POA: Diagnosis not present

## 2017-04-09 DIAGNOSIS — Z9181 History of falling: Secondary | ICD-10-CM | POA: Diagnosis not present

## 2017-04-10 ENCOUNTER — Telehealth: Payer: Self-pay | Admitting: *Deleted

## 2017-04-10 NOTE — Telephone Encounter (Signed)
Faxed signed POC back to [facility]. Fax: [phone]. Received confirmation.  Freq/duration of visits: SN [id number], [id number], [id number], [id number]

## 2017-04-16 ENCOUNTER — Other Ambulatory Visit: Payer: Self-pay

## 2017-04-16 ENCOUNTER — Other Ambulatory Visit (INDEPENDENT_AMBULATORY_CARE_PROVIDER_SITE_OTHER): Payer: PPO

## 2017-04-16 DIAGNOSIS — R7989 Other specified abnormal findings of blood chemistry: Secondary | ICD-10-CM

## 2017-04-16 DIAGNOSIS — K754 Autoimmune hepatitis: Secondary | ICD-10-CM

## 2017-04-16 DIAGNOSIS — R945 Abnormal results of liver function studies: Principal | ICD-10-CM

## 2017-04-16 LAB — HEPATIC FUNCTION PANEL
ALBUMIN: 4.2 g/dL (ref 3.5–5.2)
ALK PHOS: 56 U/L (ref 39–117)
ALT: 26 U/L (ref 0–35)
AST: 31 U/L (ref 0–37)
BILIRUBIN TOTAL: 0.7 mg/dL (ref 0.2–1.2)
Bilirubin, Direct: 0.2 mg/dL (ref 0.0–0.3)
Total Protein: 6.7 g/dL (ref 6.0–8.3)

## 2017-04-17 ENCOUNTER — Telehealth: Payer: Self-pay | Admitting: *Deleted

## 2017-04-17 NOTE — Telephone Encounter (Signed)
Faxed signed order back "to continue [facility] PT for 1WK1 to address dynamic balance training, fall prevention, and gait training".   Fax: [phone]. Received confirmation.

## 2017-05-12 ENCOUNTER — Other Ambulatory Visit: Payer: Self-pay | Admitting: Neurology

## 2017-05-25 ENCOUNTER — Other Ambulatory Visit: Payer: Self-pay | Admitting: Internal Medicine

## 2017-05-30 ENCOUNTER — Other Ambulatory Visit: Payer: Self-pay | Admitting: Family Medicine

## 2017-05-30 DIAGNOSIS — Z1231 Encounter for screening mammogram for malignant neoplasm of breast: Secondary | ICD-10-CM

## 2017-06-06 ENCOUNTER — Ambulatory Visit
Admission: RE | Admit: 2017-06-06 | Discharge: 2017-06-06 | Disposition: A | Discharge status: Home / Self Care | Payer: PPO | Source: Ambulatory Visit | Attending: Family Medicine | Admitting: Family Medicine

## 2017-06-06 DIAGNOSIS — Z1231 Encounter for screening mammogram for malignant neoplasm of breast: Secondary | ICD-10-CM

## 2017-07-02 DIAGNOSIS — Z0000 Encounter for general adult medical examination without abnormal findings: Secondary | ICD-10-CM | POA: Diagnosis not present

## 2017-07-02 DIAGNOSIS — E119 Type 2 diabetes mellitus without complications: Secondary | ICD-10-CM | POA: Diagnosis not present

## 2017-07-02 DIAGNOSIS — Z23 Encounter for immunization: Secondary | ICD-10-CM | POA: Diagnosis not present

## 2017-07-02 DIAGNOSIS — R8290 Unspecified abnormal findings in urine: Secondary | ICD-10-CM | POA: Diagnosis not present

## 2017-07-02 DIAGNOSIS — E559 Vitamin D deficiency, unspecified: Secondary | ICD-10-CM | POA: Diagnosis not present

## 2017-07-02 DIAGNOSIS — I10 Essential (primary) hypertension: Secondary | ICD-10-CM | POA: Diagnosis not present

## 2017-07-02 DIAGNOSIS — F419 Anxiety disorder, unspecified: Secondary | ICD-10-CM | POA: Diagnosis not present

## 2017-07-02 DIAGNOSIS — N3000 Acute cystitis without hematuria: Secondary | ICD-10-CM | POA: Diagnosis not present

## 2017-07-02 DIAGNOSIS — E782 Mixed hyperlipidemia: Secondary | ICD-10-CM | POA: Diagnosis not present

## 2017-07-02 DIAGNOSIS — G35 Multiple sclerosis: Secondary | ICD-10-CM | POA: Diagnosis not present

## 2017-07-10 ENCOUNTER — Other Ambulatory Visit: Payer: Self-pay | Admitting: Neurology

## 2017-07-10 ENCOUNTER — Encounter: Payer: Self-pay | Admitting: Neurology

## 2017-07-10 DIAGNOSIS — G35 Multiple sclerosis: Secondary | ICD-10-CM

## 2017-07-10 DIAGNOSIS — Z5181 Encounter for therapeutic drug level monitoring: Secondary | ICD-10-CM

## 2017-07-10 MED ORDER — TRAMADOL HCL 50 MG PO TABS: ORAL_TABLET | ORAL | 1 refills | Status: DC

## 2017-09-10 ENCOUNTER — Ambulatory Visit: Payer: PPO | Admitting: Neurology

## 2017-09-15 ENCOUNTER — Telehealth: Payer: Self-pay | Admitting: *Deleted

## 2017-09-15 NOTE — Telephone Encounter (Signed)
Submitted PA copaxone on covermymeds. Key: [id number]. Waiting on determination from [facility].

## 2017-09-16 NOTE — Telephone Encounter (Signed)
Received fax notification from [facility] that Copaxone previously approved effective until 09/29/2018.

## 2017-10-09 ENCOUNTER — Other Ambulatory Visit (INDEPENDENT_AMBULATORY_CARE_PROVIDER_SITE_OTHER): Payer: PPO

## 2017-10-09 DIAGNOSIS — K754 Autoimmune hepatitis: Secondary | ICD-10-CM | POA: Diagnosis not present

## 2017-10-09 LAB — HEPATIC FUNCTION PANEL
ALK PHOS: 68 U/L (ref 39–117)
ALT: 29 U/L (ref 0–35)
AST: 43 U/L — AB (ref 0–37)
Albumin: 4.4 g/dL (ref 3.5–5.2)
BILIRUBIN DIRECT: 0.1 mg/dL (ref 0.0–0.3)
TOTAL PROTEIN: 7.4 g/dL (ref 6.0–8.3)
Total Bilirubin: 0.5 mg/dL (ref 0.2–1.2)

## 2017-10-10 ENCOUNTER — Other Ambulatory Visit: Payer: Self-pay

## 2017-10-10 ENCOUNTER — Telehealth: Payer: Self-pay | Admitting: *Deleted

## 2017-10-10 DIAGNOSIS — R7989 Other specified abnormal findings of blood chemistry: Secondary | ICD-10-CM

## 2017-10-10 DIAGNOSIS — R945 Abnormal results of liver function studies: Principal | ICD-10-CM

## 2017-10-10 MED ORDER — ALPRAZOLAM 1 MG PO TABS: ORAL_TABLET | ORAL | 1 refills | Status: DC

## 2017-10-10 NOTE — Telephone Encounter (Signed)
Received fax refill request from [facility] on [address] for rx alprazolam 1mg  tablet.

## 2017-10-10 NOTE — Telephone Encounter (Signed)
The alprazolam will be refilled.

## 2017-10-13 ENCOUNTER — Encounter: Payer: Self-pay | Admitting: Neurology

## 2017-10-13 NOTE — Telephone Encounter (Signed)
Faxed printed/signed rx alprazolam to [facility] at [phone]. Received fax confirmation.

## 2017-10-24 DIAGNOSIS — R350 Frequency of micturition: Secondary | ICD-10-CM | POA: Diagnosis not present

## 2017-11-17 ENCOUNTER — Telehealth: Payer: Self-pay | Admitting: Internal Medicine

## 2017-11-17 MED ORDER — AZATHIOPRINE 50 MG PO TABS: ORAL_TABLET | ORAL | 1 refills | Status: DC

## 2017-11-17 NOTE — Telephone Encounter (Signed)
Refilled Imuran.  Patient needs office visit for further refills!!!

## 2017-11-18 ENCOUNTER — Telehealth: Payer: Self-pay | Admitting: Internal Medicine

## 2017-11-18 NOTE — Telephone Encounter (Signed)
Patient called back stating that she is out of this medication

## 2017-11-19 NOTE — Telephone Encounter (Signed)
Initiated prior authorization.  Spoke with patient and told her as soon as I received the paperwork I would fill it out and fax back immediately.  Patient understood

## 2017-11-19 NOTE — Telephone Encounter (Signed)
Jeri calling from [employer] wanting to follow up regarding prior authorization for this medication and wants to make sure fax request was received. If not Best call back # [phone] press 0 then opt 3 then opt 1. Reference # [id number].

## 2017-11-19 NOTE — Telephone Encounter (Signed)
Received paperwork regarding prior authorization, filled it out and faxed it back.  Awaiting response.

## 2017-11-20 NOTE — Telephone Encounter (Signed)
Medication approved, processed, and picked up by patient.

## 2017-11-24 ENCOUNTER — Other Ambulatory Visit: Payer: Self-pay | Admitting: Neurology

## 2017-12-31 ENCOUNTER — Other Ambulatory Visit: Payer: Self-pay | Admitting: Neurology

## 2017-12-31 DIAGNOSIS — G35 Multiple sclerosis: Secondary | ICD-10-CM

## 2017-12-31 DIAGNOSIS — Z5181 Encounter for therapeutic drug level monitoring: Secondary | ICD-10-CM

## 2018-01-05 DIAGNOSIS — G35 Multiple sclerosis: Secondary | ICD-10-CM | POA: Diagnosis not present

## 2018-01-05 DIAGNOSIS — F419 Anxiety disorder, unspecified: Secondary | ICD-10-CM | POA: Diagnosis not present

## 2018-01-05 DIAGNOSIS — N950 Postmenopausal bleeding: Secondary | ICD-10-CM | POA: Diagnosis not present

## 2018-01-05 DIAGNOSIS — E1169 Type 2 diabetes mellitus with other specified complication: Secondary | ICD-10-CM | POA: Diagnosis not present

## 2018-01-05 DIAGNOSIS — E782 Mixed hyperlipidemia: Secondary | ICD-10-CM | POA: Diagnosis not present

## 2018-01-05 DIAGNOSIS — I10 Essential (primary) hypertension: Secondary | ICD-10-CM | POA: Diagnosis not present

## 2018-02-03 ENCOUNTER — Other Ambulatory Visit (INDEPENDENT_AMBULATORY_CARE_PROVIDER_SITE_OTHER): Payer: PPO

## 2018-02-03 DIAGNOSIS — H5203 Hypermetropia, bilateral: Secondary | ICD-10-CM | POA: Diagnosis not present

## 2018-02-03 DIAGNOSIS — R300 Dysuria: Secondary | ICD-10-CM | POA: Diagnosis not present

## 2018-02-03 DIAGNOSIS — E119 Type 2 diabetes mellitus without complications: Secondary | ICD-10-CM | POA: Diagnosis not present

## 2018-02-03 DIAGNOSIS — R7989 Other specified abnormal findings of blood chemistry: Secondary | ICD-10-CM

## 2018-02-03 DIAGNOSIS — H524 Presbyopia: Secondary | ICD-10-CM | POA: Diagnosis not present

## 2018-02-03 DIAGNOSIS — R945 Abnormal results of liver function studies: Secondary | ICD-10-CM

## 2018-02-03 DIAGNOSIS — H52223 Regular astigmatism, bilateral: Secondary | ICD-10-CM | POA: Diagnosis not present

## 2018-02-03 LAB — HEPATIC FUNCTION PANEL
ALK PHOS: 59 U/L (ref 39–117)
ALT: 38 U/L — ABNORMAL HIGH (ref 0–35)
AST: 45 U/L — ABNORMAL HIGH (ref 0–37)
Albumin: 4.6 g/dL (ref 3.5–5.2)
BILIRUBIN DIRECT: 0.1 mg/dL (ref 0.0–0.3)
Total Bilirubin: 0.6 mg/dL (ref 0.2–1.2)
Total Protein: 7.5 g/dL (ref 6.0–8.3)

## 2018-02-04 ENCOUNTER — Other Ambulatory Visit: Payer: Self-pay

## 2018-02-04 DIAGNOSIS — K754 Autoimmune hepatitis: Secondary | ICD-10-CM

## 2018-02-19 ENCOUNTER — Other Ambulatory Visit: Payer: Self-pay | Admitting: *Deleted

## 2018-02-19 MED ORDER — COPAXONE 40 MG/ML ~~LOC~~ SOSY: PREFILLED_SYRINGE | SUBCUTANEOUS | 11 refills | Status: DC

## 2018-02-20 ENCOUNTER — Ambulatory Visit: Payer: PPO | Admitting: Neurology

## 2018-02-20 ENCOUNTER — Encounter

## 2018-02-26 DIAGNOSIS — N390 Urinary tract infection, site not specified: Secondary | ICD-10-CM | POA: Diagnosis not present

## 2018-03-06 DIAGNOSIS — N3946 Mixed incontinence: Secondary | ICD-10-CM | POA: Diagnosis not present

## 2018-03-06 DIAGNOSIS — R109 Unspecified abdominal pain: Secondary | ICD-10-CM | POA: Diagnosis not present

## 2018-03-06 DIAGNOSIS — R350 Frequency of micturition: Secondary | ICD-10-CM | POA: Diagnosis not present

## 2018-03-13 ENCOUNTER — Encounter: Payer: Self-pay | Admitting: Neurology

## 2018-03-17 ENCOUNTER — Encounter: Payer: Self-pay | Admitting: Internal Medicine

## 2018-03-17 DIAGNOSIS — N763 Subacute and chronic vulvitis: Secondary | ICD-10-CM | POA: Diagnosis not present

## 2018-03-17 DIAGNOSIS — N898 Other specified noninflammatory disorders of vagina: Secondary | ICD-10-CM | POA: Diagnosis not present

## 2018-03-17 NOTE — Telephone Encounter (Signed)
Message received but this is a patient of Dr. Blanch Media I will CC him

## 2018-03-17 NOTE — Telephone Encounter (Signed)
I am doctor of the day, and will address in Dr. Blanch Media absence Is no interaction between cephalexin and azathioprine However cephalexin can be associated with elevation in serum AST and ALT It is okay for her to switch prophylactic antibiotics to cephalexin but I would recommend checking a hepatic function panel 10 days to 14 days after making antibiotics switch

## 2018-03-24 DIAGNOSIS — N310 Uninhibited neuropathic bladder, not elsewhere classified: Secondary | ICD-10-CM | POA: Diagnosis not present

## 2018-03-24 DIAGNOSIS — N390 Urinary tract infection, site not specified: Secondary | ICD-10-CM | POA: Diagnosis not present

## 2018-03-24 DIAGNOSIS — N3020 Other chronic cystitis without hematuria: Secondary | ICD-10-CM | POA: Diagnosis not present

## 2018-03-25 ENCOUNTER — Ambulatory Visit: Payer: PPO | Admitting: Internal Medicine

## 2018-03-25 ENCOUNTER — Other Ambulatory Visit (INDEPENDENT_AMBULATORY_CARE_PROVIDER_SITE_OTHER): Payer: PPO

## 2018-03-25 ENCOUNTER — Encounter: Payer: Self-pay | Admitting: Internal Medicine

## 2018-03-25 VITALS — BP 124/82 | HR 76

## 2018-03-25 DIAGNOSIS — K754 Autoimmune hepatitis: Secondary | ICD-10-CM | POA: Diagnosis not present

## 2018-03-25 DIAGNOSIS — R7989 Other specified abnormal findings of blood chemistry: Secondary | ICD-10-CM

## 2018-03-25 DIAGNOSIS — R945 Abnormal results of liver function studies: Secondary | ICD-10-CM | POA: Diagnosis not present

## 2018-03-25 LAB — COMPREHENSIVE METABOLIC PANEL
ALBUMIN: 4.5 g/dL (ref 3.5–5.2)
ALK PHOS: 44 U/L (ref 39–117)
ALT: 27 U/L (ref 0–35)
AST: 32 U/L (ref 0–37)
BILIRUBIN TOTAL: 0.5 mg/dL (ref 0.2–1.2)
BUN: 21 mg/dL (ref 6–23)
CO2: 31 mEq/L (ref 19–32)
Calcium: 9.9 mg/dL (ref 8.4–10.5)
Chloride: 103 mEq/L (ref 96–112)
Creatinine, Ser: 0.81 mg/dL (ref 0.40–1.20)
GFR: 76.18 mL/min (ref >60.00)
Glucose, Bld: 131 mg/dL — ABNORMAL HIGH (ref 70–99)
POTASSIUM: 4.1 meq/L (ref 3.5–5.1)
SODIUM: 141 meq/L (ref 135–145)
TOTAL PROTEIN: 7.2 g/dL (ref 6.0–8.3)

## 2018-03-25 LAB — HEPATIC FUNCTION PANEL
ALT: 27 U/L (ref 0–35)
AST: 32 U/L (ref 0–37)
Albumin: 4.5 g/dL (ref 3.5–5.2)
Alkaline Phosphatase: 44 U/L (ref 39–117)
BILIRUBIN DIRECT: 0.1 mg/dL (ref 0.0–0.3)
BILIRUBIN TOTAL: 0.5 mg/dL (ref 0.2–1.2)
Total Protein: 7.2 g/dL (ref 6.0–8.3)

## 2018-03-25 LAB — CBC WITH DIFFERENTIAL/PLATELET
Basophils Absolute: 0 10*3/uL (ref 0.0–0.1)
Basophils Relative: 0.6 % (ref 0.0–3.0)
EOS PCT: 2.5 % (ref 0.0–5.0)
Eosinophils Absolute: 0.1 10*3/uL (ref 0.0–0.7)
HCT: 35.6 % — ABNORMAL LOW (ref 36.0–46.0)
HEMOGLOBIN: 12.5 g/dL (ref 12.0–15.0)
Lymphocytes Relative: 24 % (ref 12.0–46.0)
Lymphs Abs: 1.1 10*3/uL (ref 0.7–4.0)
MCHC: 35 g/dL (ref 30.0–36.0)
MCV: 94.7 fl (ref 78.0–100.0)
MONO ABS: 0.5 10*3/uL (ref 0.1–1.0)
Monocytes Relative: 10.2 % (ref 3.0–12.0)
Neutro Abs: 2.9 10*3/uL (ref 1.4–7.7)
Neutrophils Relative %: 62.7 % (ref 43.0–77.0)
Platelets: 237 10*3/uL (ref 150.0–400.0)
RBC: 3.76 Mil/uL — AB (ref 3.87–5.11)
RDW: 15.3 % (ref 11.5–15.5)
WBC: 4.6 10*3/uL (ref 4.0–10.5)

## 2018-03-25 LAB — PROTIME-INR
INR: 1 ratio (ref 0.8–1.0)
PROTHROMBIN TIME: 12.2 s (ref 9.6–13.1)

## 2018-03-25 NOTE — Progress Notes (Signed)
HISTORY OF PRESENT ILLNESS:  Gabriela Rice is a pleasant 62 y.o. female  With biopsy supported autoimmune hepatitis on Imuran 150 mg daily, heterozygous for 2 separate hemachromatosis mutations, previous evaluation elsewhere prior to establishing with this office demonstrated fatty liver without fibrosis on liver biopsy, father with cirrhosis of uncertain etiology but significant history of alcohol consumption, functional constipation, and multiple significant medical problems the most significant of which is debilitating multiple sclerosis. The patient was last seen in this office 10/23/2015. At that time she was stable from a GI standpoint. She was continued on Imuran. MiraLAX recommended for constipation. She was to have regular blood work and follow-up in 6 months. She has been compliant with regular blood work which has demonstrated either normal liver tests or mild abnormalities of transaminases with normal globulins and albumin. She is good to contact this office when she has questions regarding possible drugs may affect her liver. However, she did not follow-up in 6 months as requested and follows up at this time at my request. She is accompanied by her husband. She is in a wheelchair. Has limited mobility due to her MS. Continues to struggle with constipation. Using MiraLAX 1 dose daily. Tolerating her GI medications. GI review of systems is otherwise negative. She is going to be placed on Keflex for recurrent UTIs. She asked about liver test monitoring should she have an idiosyncratic reaction to the drug.  REVIEW OF SYSTEMS:  All non-GI ROS negative except for edema and neurologic deficits related to MS  Past Medical History:  Diagnosis Date  . Abnormality of gait 02/16/2013  . Allergic rhinitis   . Allergy   . Anxiety   . Autoimmune hepatitis ([place])   . Autoimmune hepatitis ([place])   . Colon polyps   . Diverticulosis   . DM (diabetes mellitus) ([place])   . Elevated LFTs   . Hemorrhoids    . Hemorrhoids   . Hepatitis    with cholestasis  . Hyperlipidemia   . Hypertension   . Migraines   . Multiple sclerosis ([place])   . Obesity   . Vitamin D deficiency     Past Surgical History:  Procedure Laterality Date  . ABDOMINAL HYSTERECTOMY    . CESAREAN SECTION     x 2  . MYOECTOMY    . TONSILLECTOMY AND ADENOIDECTOMY      Social History Gabriela Rice  reports that she quit smoking about 22 years ago. Her smoking use included cigarettes. She has a 1.00 pack-year smoking history. She has never used smokeless tobacco. She reports that she does not drink alcohol or use drugs.  family history includes Brain cancer in her maternal uncle; Cirrhosis in her father; Colon cancer in her maternal aunt and maternal uncle; Diabetes in her mother; Heart disease in her maternal grandmother; Multiple sclerosis in her mother; Uterine cancer in her maternal aunt.  Allergies  Allergen Reactions  . Penicillins Other (See Comments)    seizure as child Has patient had a PCN reaction causing immediate rash, facial/tongue/throat swelling, SOB or lightheadedness with hypotension: No Has patient had a PCN reaction causing severe rash involving mucus membranes or skin necrosis:No Has patient had a PCN reaction that required hospitalizationNo Has patient had a PCN reaction occurring within the last 10 years:No If all of the above answers are "NO", then may proceed with Cephalosporin use.    . Codeine Swelling    hands swell       PHYSICAL EXAMINATION: Vital signs: BP  124/82   Pulse 76   Constitutional: pleasant, overweight, in wheelchair, no acute distress Psychiatric: alert and oriented x3, cooperative Eyes: extraocular movements intact, anicteric, conjunctiva pink Mouth: oral pharynx moist, no lesions Neck: supple no lymphadenopathy Cardiovascular: heart regular rate and rhythm, no murmur Lungs: clear to auscultation bilaterally Abdomen: soft,obese, nontender, nondistended, no  obvious ascites, no peritoneal signs, normal bowel sounds, no organomegaly Rectal:omitted Extremities: no clubbing, cyanosis, or lower extremity edema bilaterally Skin: no lesions on visible extremities Neuro: wheelchair-bound. Cranial nerves intact.   ASSESSMENT:  #1. Biopsies supported autoimmune hepatitis on Imuran 150 mg daily #2. Heterozygous for to hemachromatosis mutations #3. Chronic constipation ongoing #4. Last colonoscopy 2011 with non-adenomatous polyp #5. Multiple sclerosis  PLAN:  #1. Continue Imuran 150 mg daily #2. CBC, comprehensive metabolic panel, PT/INR today #3. Have liver tests performed 2 weeks after initiating Keflex to rule out drug reaction #4. Advised to increase MiraLAX to achieve desired result #5. Routine office follow-up one year  ADDENDUM All laboratories today were normal. Plan CBC and liver tests in 6 months  25 minutes spent face-to-face with the patient. Greater than 50% a time use for counseling regarding management of her autoimmune liver disease. Discussions regarding monitoring of her liver tests after introduction of new drugs, and strategies to improve her issues with constipation

## 2018-03-25 NOTE — Patient Instructions (Signed)
Your provider has requested that you go to the basement level for lab work before leaving today. Press "B" on the elevator. The lab is located at the first door on the left as you exit the elevator.  Increase your MIralax to get the desired result  Please follow up in one year

## 2018-03-26 ENCOUNTER — Other Ambulatory Visit: Payer: Self-pay | Admitting: *Deleted

## 2018-03-26 DIAGNOSIS — K760 Fatty (change of) liver, not elsewhere classified: Secondary | ICD-10-CM

## 2018-04-01 ENCOUNTER — Encounter: Payer: Self-pay | Admitting: Neurology

## 2018-04-01 ENCOUNTER — Ambulatory Visit (INDEPENDENT_AMBULATORY_CARE_PROVIDER_SITE_OTHER): Payer: PPO | Admitting: Neurology

## 2018-04-01 VITALS — BP 132/82 | HR 76

## 2018-04-01 DIAGNOSIS — G35 Multiple sclerosis: Secondary | ICD-10-CM

## 2018-04-01 DIAGNOSIS — K754 Autoimmune hepatitis: Secondary | ICD-10-CM | POA: Diagnosis not present

## 2018-04-01 DIAGNOSIS — R269 Unspecified abnormalities of gait and mobility: Secondary | ICD-10-CM

## 2018-04-01 NOTE — Progress Notes (Signed)
Reason for visit: Multiple sclerosis  Gabriela Rice is an 62 y.o. female  History of present illness:  Gabriela Rice is a 62 year old right-handed white female with a history of multiple sclerosis.  The patient has a paraparesis associated with this.  She has a neurogenic bladder, she is having frequent urinary tract infections, she is on daily antibiotics for this.  She is followed through urology.  She is on Copaxone.  She has autoimmune hepatitis and has been taking Imuran and for this reason cannot go on more potent medications for multiple sclerosis.  The patient is reporting some difficulty getting in and out of bed.  Her husband has to help her do this.  She has not had any falls, she uses a walker to ambulate but can only ambulate short distances.  She has urinary frequency, she goes to bathroom at least 15 times a day.  She has a lift chair at home which helps her stand up.  She returns for an evaluation.  She has had recent blood work done.  Past Medical History:  Diagnosis Date  . Abnormality of gait 02/16/2013  . Allergic rhinitis   . Allergy   . Anxiety   . Autoimmune hepatitis ([place])   . Autoimmune hepatitis ([place])   . Colon polyps   . Diverticulosis   . DM (diabetes mellitus) ([place])   . Elevated LFTs   . Hemorrhoids   . Hemorrhoids   . Hepatitis    with cholestasis  . Hyperlipidemia   . Hypertension   . Migraines   . Multiple sclerosis ([place])   . Obesity   . Vitamin D deficiency     Past Surgical History:  Procedure Laterality Date  . ABDOMINAL HYSTERECTOMY    . CESAREAN SECTION     x 2  . MYOECTOMY    . TONSILLECTOMY AND ADENOIDECTOMY      Family History  Problem Relation Age of Onset  . Diabetes Mother   . Multiple sclerosis Mother   . Cirrhosis Father   . Heart disease Maternal Grandmother   . Colon cancer Maternal Aunt   . Uterine cancer Maternal Aunt   . Brain cancer Maternal Uncle   . Colon cancer Maternal Uncle     Social history:  reports  that she quit smoking about 22 years ago. Her smoking use included cigarettes. She has a 1.00 pack-year smoking history. She has never used smokeless tobacco. She reports that she does not drink alcohol or use drugs.    Allergies  Allergen Reactions  . Penicillins Other (See Comments)    seizure as child Has patient had a PCN reaction causing immediate rash, facial/tongue/throat swelling, SOB or lightheadedness with hypotension: No Has patient had a PCN reaction causing severe rash involving mucus membranes or skin necrosis:No Has patient had a PCN reaction that required hospitalizationNo Has patient had a PCN reaction occurring within the last 10 years:No If all of the above answers are "NO", then may proceed with Cephalosporin use.    . Codeine Swelling    hands swell    Medications:  Prior to Admission medications   Medication Sig Start Date End Date Taking? Authorizing Provider  ALPRAZolam Duanne Moron) 1 MG tablet take 1 tablet by mouth at bedtime if needed 10/10/17  Yes Kathrynn Ducking, MD  aspirin EC 81 MG tablet Take 81 mg by mouth daily.   Yes [provider]  azaTHIOprine (IMURAN) 50 MG tablet Take 3 tab at the same time  daily. 11/17/17  Yes Irene Shipper, MD  baclofen (LIORESAL) 10 MG tablet TAKE 1 TABLET BY MOUTH THREE TIMES DAILY 11/24/17  Yes Kathrynn Ducking, MD  cephALEXin (KEFLEX) 250 MG capsule Take 1 capsule by mouth daily. 03/06/18  Yes [provider]  cetirizine (ZYRTEC) 10 MG tablet Take 10 mg by mouth daily.   Yes [provider]  Cholecalciferol (VITAMIN D) 2000 UNITS CAPS Take 2,000 Units by mouth daily.    Yes [provider]  COPAXONE 40 MG/ML SOSY INJECT 40 MG (1 ML) UNDER THE SKIN THREE TIMES A WEEK 02/19/18  Yes Kathrynn Ducking, MD  escitalopram (LEXAPRO) 10 MG tablet Take 10 mg by mouth daily. 08/14/15  Yes [provider]  gabapentin (NEURONTIN) 300 MG capsule Take 1 capsule (300 mg total) by mouth 3 (three) times  daily. 01/14/17  Yes Kathrynn Ducking, MD  ibuprofen (ADVIL,MOTRIN) 200 MG tablet Take 200 mg by mouth every 6 (six) hours as needed for pain.   Yes [provider]  losartan (COZAAR) 100 MG tablet Take 100 mg by mouth daily.  09/02/13  Yes [provider]  metFORMIN (GLUCOPHAGE) 500 MG tablet Take 500 mg by mouth 2 (two) times daily with a meal.  03/15/13  Yes [provider]  polyethylene glycol powder (GLYCOLAX/MIRALAX) powder Take one scoop in 8 oz of water daily 09/07/15  Yes Love, Pamela S, PA-C  polyethylene glycol powder (GLYCOLAX/MIRALAX) powder take 17GM (DISSOLVED IN WATER) by mouth once daily 05/26/17  Yes Irene Shipper, MD  pravastatin (PRAVACHOL) 40 MG tablet Take 40 mg by mouth daily.  07/04/14  Yes [provider]  traMADol (ULTRAM) 50 MG tablet TAKE 1 TABLET BY MOUTH THREE TIMES A DAY IF NEEDED. MUST LAST 90 DAYS 12/31/17  Yes Kathrynn Ducking, MD    ROS:  Out of a complete 14 system review of symptoms, the patient complains only of the following symptoms, and all other reviewed systems are negative.  Fatigue Runny nose Eye itching, blurred vision Heat intolerance Swollen abdomen, constipation Frequent waking Difficulty urinating, frequency of urination Joint pain, back pain, walking difficulty Dizziness, numbness, weakness Anxiety  Blood pressure 132/82, pulse 76, SpO2 96 %.  Physical Exam  General: The patient is alert and cooperative at the time of the examination.  The patient is moderately obese.  Skin: 2+ edema below the knees is seen.   Neurologic Exam  Mental status: The patient is alert and oriented x 3 at the time of the examination. The patient has apparent normal recent and remote memory, with an apparently normal attention span and concentration ability.   Cranial nerves: Facial symmetry is present. Speech is normal, no aphasia or dysarthria is noted. Extraocular movements are full. Visual fields are full.  Motor:  The patient has good strength in the upper extremities.  The patient has the ability to extend the knee with the thigh supported, she has minimal ability to flex at the hips.  She has 4/5 strength with dorsiflexion.  Sensory examination: Soft touch sensation is symmetric on the face, arms, and legs.  Coordination: The patient has good finger-nose-finger bilaterally.  The patient has difficulty performing heel-to-shin on either side.  Gait and station: The patient has the ability to stand with some effort, she cannot ambulate when she is up.  Reflexes: Deep tendon reflexes are symmetric.   Assessment/Plan:  1.  Multiple sclerosis  2.  Paraparesis  The patient has very limited ability to ambulate  at this point.  She will remain on Copaxone, she has continue the Imuran therapy for her autoimmune hepatitis.  This will negate the use of more potent MS medications.  The patient will follow-up in 6 months.  Jill Alexanders MD 04/01/2018 3:39 PM  [facility] [address] [place], [place] [address]  Phone [phone] Fax [phone]

## 2018-04-03 ENCOUNTER — Other Ambulatory Visit: Payer: Self-pay | Admitting: Neurology

## 2018-04-03 DIAGNOSIS — Z5181 Encounter for therapeutic drug level monitoring: Secondary | ICD-10-CM

## 2018-04-03 DIAGNOSIS — G35 Multiple sclerosis: Secondary | ICD-10-CM

## 2018-04-07 NOTE — Telephone Encounter (Signed)
Rx registry checked. Tramadol last filled on 12/31/17 for #270 and Xanax last filled on 01/08/18 for #90. Last saw Dr. Jannifer Franklin on 04/01/18.

## 2018-04-08 DIAGNOSIS — E1165 Type 2 diabetes mellitus with hyperglycemia: Secondary | ICD-10-CM | POA: Diagnosis not present

## 2018-04-08 NOTE — Telephone Encounter (Signed)
Rx sent electronically.  

## 2018-04-15 ENCOUNTER — Other Ambulatory Visit (INDEPENDENT_AMBULATORY_CARE_PROVIDER_SITE_OTHER): Payer: PPO

## 2018-04-15 DIAGNOSIS — K754 Autoimmune hepatitis: Secondary | ICD-10-CM | POA: Diagnosis not present

## 2018-04-15 LAB — HEPATIC FUNCTION PANEL
ALK PHOS: 51 U/L (ref 39–117)
ALT: 27 U/L (ref 0–35)
AST: 32 U/L (ref 0–37)
Albumin: 4.4 g/dL (ref 3.5–5.2)
BILIRUBIN DIRECT: 0.1 mg/dL (ref 0.0–0.3)
BILIRUBIN TOTAL: 0.7 mg/dL (ref 0.2–1.2)
Total Protein: 7.2 g/dL (ref 6.0–8.3)

## 2018-05-04 ENCOUNTER — Other Ambulatory Visit: Payer: Self-pay | Admitting: Internal Medicine

## 2018-05-10 ENCOUNTER — Other Ambulatory Visit: Payer: Self-pay | Admitting: Internal Medicine

## 2018-05-10 ENCOUNTER — Other Ambulatory Visit: Payer: Self-pay | Admitting: Neurology

## 2018-05-11 NOTE — Telephone Encounter (Signed)
Baclofen refilled to [facility] in [place].MB RN

## 2018-05-12 ENCOUNTER — Other Ambulatory Visit: Payer: Self-pay | Admitting: Neurology

## 2018-05-12 DIAGNOSIS — R300 Dysuria: Secondary | ICD-10-CM

## 2018-05-12 DIAGNOSIS — Z5181 Encounter for therapeutic drug level monitoring: Secondary | ICD-10-CM

## 2018-05-12 MED ORDER — PREDNISONE 10 MG PO TABS: ORAL_TABLET | ORAL | 0 refills | Status: DC

## 2018-05-13 ENCOUNTER — Other Ambulatory Visit (INDEPENDENT_AMBULATORY_CARE_PROVIDER_SITE_OTHER): Payer: Self-pay

## 2018-05-13 ENCOUNTER — Other Ambulatory Visit: Payer: Self-pay | Admitting: Neurology

## 2018-05-13 DIAGNOSIS — Z5181 Encounter for therapeutic drug level monitoring: Secondary | ICD-10-CM

## 2018-05-13 DIAGNOSIS — R300 Dysuria: Secondary | ICD-10-CM | POA: Diagnosis not present

## 2018-05-13 DIAGNOSIS — Z0289 Encounter for other administrative examinations: Secondary | ICD-10-CM

## 2018-05-14 ENCOUNTER — Telehealth: Payer: Self-pay | Admitting: Neurology

## 2018-05-14 DIAGNOSIS — B009 Herpesviral infection, unspecified: Secondary | ICD-10-CM | POA: Diagnosis not present

## 2018-05-14 DIAGNOSIS — G35 Multiple sclerosis: Secondary | ICD-10-CM

## 2018-05-14 DIAGNOSIS — F419 Anxiety disorder, unspecified: Secondary | ICD-10-CM | POA: Diagnosis not present

## 2018-05-14 DIAGNOSIS — E119 Type 2 diabetes mellitus without complications: Secondary | ICD-10-CM | POA: Diagnosis not present

## 2018-05-14 LAB — COMPREHENSIVE METABOLIC PANEL
A/G RATIO: 1.6 (ref 1.2–2.2)
ALT: 31 IU/L (ref 0–32)
AST: 37 IU/L (ref 0–40)
Albumin: 4.6 g/dL (ref 3.6–4.8)
Alkaline Phosphatase: 59 IU/L (ref 39–117)
BUN/Creatinine Ratio: 16 (ref 12–28)
BUN: 12 mg/dL (ref 8–27)
Bilirubin Total: 0.7 mg/dL (ref 0.0–1.2)
CALCIUM: 9.7 mg/dL (ref 8.7–10.3)
CO2: 27 mmol/L (ref 20–29)
Chloride: 100 mmol/L (ref 96–106)
Creatinine, Ser: 0.77 mg/dL (ref 0.57–1.00)
GFR, EST AFRICAN AMERICAN: 96 mL/min/{1.73_m2} (ref >59)
GFR, EST NON AFRICAN AMERICAN: 83 mL/min/{1.73_m2} (ref >59)
Globulin, Total: 2.8 g/dL (ref 1.5–4.5)
Glucose: 111 mg/dL — ABNORMAL HIGH (ref 65–99)
POTASSIUM: 4.3 mmol/L (ref 3.5–5.2)
Sodium: 143 mmol/L (ref 134–144)
TOTAL PROTEIN: 7.4 g/dL (ref 6.0–8.5)

## 2018-05-14 LAB — CBC WITH DIFFERENTIAL/PLATELET
BASOS: 0 %
Basophils Absolute: 0 10*3/uL (ref 0.0–0.2)
EOS (ABSOLUTE): 0.1 10*3/uL (ref 0.0–0.4)
EOS: 3 %
HEMATOCRIT: 36.1 % (ref 34.0–46.6)
Hemoglobin: 12.2 g/dL (ref 11.1–15.9)
IMMATURE GRANS (ABS): 0 10*3/uL (ref 0.0–0.1)
IMMATURE GRANULOCYTES: 0 %
LYMPHS: 21 %
Lymphocytes Absolute: 0.9 10*3/uL (ref 0.7–3.1)
MCH: 31.4 pg (ref 26.6–33.0)
MCHC: 33.8 g/dL (ref 31.5–35.7)
MCV: 93 fL (ref 79–97)
Monocytes Absolute: 0.2 10*3/uL (ref 0.1–0.9)
Monocytes: 6 %
NEUTROS PCT: 70 %
Neutrophils Absolute: 2.9 10*3/uL (ref 1.4–7.0)
PLATELETS: 236 10*3/uL (ref 150–450)
RBC: 3.89 x10E6/uL (ref 3.77–5.28)
RDW: 15.4 % (ref 12.3–15.4)
WBC: 4.1 10*3/uL (ref 3.4–10.8)

## 2018-05-14 LAB — URINALYSIS, ROUTINE W REFLEX MICROSCOPIC
Bilirubin, UA: NEGATIVE
Glucose, UA: NEGATIVE
Ketones, UA: NEGATIVE
Nitrite, UA: NEGATIVE
PH UA: 6 (ref 5.0–7.5)
PROTEIN UA: NEGATIVE
RBC, UA: NEGATIVE
Specific Gravity, UA: 1.009 (ref 1.005–1.030)
UUROB: 0.2 mg/dL (ref 0.2–1.0)

## 2018-05-14 LAB — MICROSCOPIC EXAMINATION
Bacteria, UA: NONE SEEN
CASTS: NONE SEEN /LPF

## 2018-05-14 NOTE — Telephone Encounter (Signed)
I called the patient.  The patient believes that the walking problem is quite severe, she has not been walking much recently.  I will go ahead and get the Solu-Medrol done first the prednisone taper.

## 2018-05-14 NOTE — Telephone Encounter (Signed)
Pt requesting a call back stating she is unsure of what Dr. Jannifer Rice is wanting her to do. Wanting to discuss if she is to start the prednisone or if she should take the "drips". Per [employer]. Please call to advise before 1 today if possible

## 2018-05-16 ENCOUNTER — Telehealth: Payer: Self-pay | Admitting: Neurology

## 2018-05-16 DIAGNOSIS — G35 Multiple sclerosis: Secondary | ICD-10-CM

## 2018-05-16 NOTE — Telephone Encounter (Signed)
Patient called, she has not heard from anyone about IV infusions. She is stable, not worsening. If she gets worse may need to go to ED for infusions or start oral steroids. Otherwise we will get back to her on Monday. Order placed to home health for IV Solu-Medrol 500 mg daily for 3 days. If she worsens asked her to call me back and I can call the ED for first infusion or order prescription for high dose oral steroids

## 2018-05-18 NOTE — Telephone Encounter (Signed)
I called pt. She states she has not heard about scheduling solumedrol infusions yet. I called [facility], spoke w/ Carlean Purl. They did receive referral this am. She states they do not have a pharmacy to order medications and she advised it would be best to refer her elsewhere if we wanted her to get scheduled right away. I advised we would do this and let the patient know. She verbalized understanding.

## 2018-05-18 NOTE — Telephone Encounter (Signed)
Resent to [facility] as ASAP.

## 2018-05-18 NOTE — Telephone Encounter (Signed)
Pt requesting a call from RN, stating she is unsure why no one has called her to discuss her IV infusions

## 2018-05-18 NOTE — Telephone Encounter (Addendum)
Gabriela Rice- pt needing infusions asap. Can you send referral to [facility] instead of [facility]? Thank you  I called pt back. She is agreeable to go to [facility] instead for solumedrol infusion. Advised I will send message to [place]. to send there instead.

## 2018-05-20 NOTE — Telephone Encounter (Signed)
I called [facility] and spoke with Johann Capers the fax number is [phone]. I have faxed over the previous clinicals, the referral, and demographics per Tessa's request. I called back over to [facility] to see if they had received the fax. They did not. She reached out to someone else and they were able to find the referral. I held on the phone while they reached out to referral to see if someone could contact the patient. I was transffered to [place] in referral support. She stated that they have received it and given it to the appropriate team and it is marked urgent.   I called the patient to make her aware and give her the phone number to [facility]. I told her if she has not heard anything by 1:00 this afternoon she will need to call and check with [facility]. She took the number and agreed.

## 2018-05-20 NOTE — Telephone Encounter (Signed)
Received approval letter from [employer]. Solumedrol 500mg  vial approved from 05/20/18-09/29/18. Faxed approval letter to [facility] at [phone]. Received fax confirmation. Called and Spoke with Stanton Kidney to advise her of approval and that I faxed approval letter. I asked that they try and work pt in today to do infusion. She will contact their [place] office to see if they can fit pt in tonight.

## 2018-05-20 NOTE — Telephone Encounter (Signed)
Pt said she has not heard back from [facility] about infusion. I told her it was faxed on 8/19 and offered to get the phone number and she call them. She did not want to do that, she wanted RN to know. Please call to advise

## 2018-05-20 NOTE — Telephone Encounter (Signed)
FYI-Gabriela Rice with [facility] calling to advise may have to start Solumedrol tomorrow because cannot reach the patient to have it done tonight. She says will keep trying to reach the patient.

## 2018-05-20 NOTE — Telephone Encounter (Signed)
I called [facility] and spoke with [facility]. They never received referral. She asked that we re-send it. Advised we will resend as urgent request

## 2018-05-20 NOTE — Telephone Encounter (Signed)
Mary with [facility] called stating the RN is needing to contact [employer] for PA for the infusion at [phone] I.D. # [id number]. Mary requesting a call back at [phone] to discuss once completed

## 2018-05-20 NOTE — Telephone Encounter (Signed)
I also tried to call pt. LVM advising her [facility] was trying to contact her to schedule infusion. Advised PA approved. They were going to try and get her fit in tonight. [facility] phone: [phone].

## 2018-05-20 NOTE — Addendum Note (Signed)
Addended by: Hope Pigeon on: 05/20/2018 09:41 AM   Modules accepted: Orders

## 2018-05-20 NOTE — Telephone Encounter (Addendum)
I called and spoke with Si at [facility]. She stated PA needed if they are not using IV pump to infuse. Advised I was not sure. I placed her on hold and contact [facility]. Spoke w/ Stanton Kidney who stated they infuse by gravity, not pump. I spoke with Si again who stated this would need PA (non-formulary exception). She connected me to pharmacist- Jenice. Gave clinical information. Advised Dx MS and she needs medication for MS exacerbation. She has already tried prednisone oral tab. She submitted PA as URGENT and they will fax determination to [place] at [phone]. Ref#: [id number] I told Stanton Kidney I will complete PA and then call to update them once I receive determination. She verbalized understanding.

## 2018-05-20 NOTE — Telephone Encounter (Signed)
I spoke with Gabriela Rice. I asked her to resend urgent referral again to [facility]. I placed another referral.

## 2018-05-21 DIAGNOSIS — E119 Type 2 diabetes mellitus without complications: Secondary | ICD-10-CM | POA: Diagnosis not present

## 2018-05-21 DIAGNOSIS — N319 Neuromuscular dysfunction of bladder, unspecified: Secondary | ICD-10-CM | POA: Diagnosis not present

## 2018-05-21 DIAGNOSIS — F419 Anxiety disorder, unspecified: Secondary | ICD-10-CM | POA: Diagnosis not present

## 2018-05-21 DIAGNOSIS — I10 Essential (primary) hypertension: Secondary | ICD-10-CM | POA: Diagnosis not present

## 2018-05-21 DIAGNOSIS — J309 Allergic rhinitis, unspecified: Secondary | ICD-10-CM | POA: Diagnosis not present

## 2018-05-21 DIAGNOSIS — K754 Autoimmune hepatitis: Secondary | ICD-10-CM | POA: Diagnosis not present

## 2018-05-21 DIAGNOSIS — N390 Urinary tract infection, site not specified: Secondary | ICD-10-CM | POA: Diagnosis not present

## 2018-05-21 DIAGNOSIS — E559 Vitamin D deficiency, unspecified: Secondary | ICD-10-CM | POA: Diagnosis not present

## 2018-05-21 DIAGNOSIS — Z7982 Long term (current) use of aspirin: Secondary | ICD-10-CM | POA: Diagnosis not present

## 2018-05-21 DIAGNOSIS — Z87891 Personal history of nicotine dependence: Secondary | ICD-10-CM | POA: Diagnosis not present

## 2018-05-21 DIAGNOSIS — G35 Multiple sclerosis: Secondary | ICD-10-CM | POA: Diagnosis not present

## 2018-05-21 DIAGNOSIS — G8220 Paraplegia, unspecified: Secondary | ICD-10-CM | POA: Diagnosis not present

## 2018-05-21 DIAGNOSIS — K5790 Diverticulosis of intestine, part unspecified, without perforation or abscess without bleeding: Secondary | ICD-10-CM | POA: Diagnosis not present

## 2018-05-21 DIAGNOSIS — E785 Hyperlipidemia, unspecified: Secondary | ICD-10-CM | POA: Diagnosis not present

## 2018-05-21 DIAGNOSIS — G43909 Migraine, unspecified, not intractable, without status migrainosus: Secondary | ICD-10-CM | POA: Diagnosis not present

## 2018-05-21 DIAGNOSIS — Z794 Long term (current) use of insulin: Secondary | ICD-10-CM | POA: Diagnosis not present

## 2018-05-21 DIAGNOSIS — E669 Obesity, unspecified: Secondary | ICD-10-CM | POA: Diagnosis not present

## 2018-05-21 DIAGNOSIS — Z7952 Long term (current) use of systemic steroids: Secondary | ICD-10-CM | POA: Diagnosis not present

## 2018-05-21 NOTE — Telephone Encounter (Addendum)
Gabriela Rice [phone] called needed VO Dr Viona Gilmore is signing physician. I read below msg and said he is, she took that and said she will get the orders together and send to clinic.  FYI

## 2018-05-21 NOTE — Telephone Encounter (Signed)
I took call from [place] in phone room. Spoke with Melissa (pharmacist) with [facility]. She stated they needed signature on rx from Dr. Jannifer Franklin for Solumedrol. I did question why this was not addressed yesterday. She was unsure. She stated it was on her desk to be taken care of first thing this am. I gave VO for Solumedrol 500mg  IV x3 days. She verbalized understanding and will process for pt. Nothing further needed.

## 2018-05-21 NOTE — Telephone Encounter (Signed)
Noted  

## 2018-07-03 ENCOUNTER — Other Ambulatory Visit: Payer: Self-pay | Admitting: Neurology

## 2018-07-03 DIAGNOSIS — G35 Multiple sclerosis: Secondary | ICD-10-CM

## 2018-07-03 DIAGNOSIS — Z5181 Encounter for therapeutic drug level monitoring: Secondary | ICD-10-CM

## 2018-07-03 NOTE — Telephone Encounter (Signed)
Rx registry checked. Last fill date is 04/07/18. Dr. Krista Blue, could you please fill in Dr. Jannifer Franklin' absence. Thank you.

## 2018-07-08 ENCOUNTER — Other Ambulatory Visit: Payer: Self-pay | Admitting: Family Medicine

## 2018-07-08 DIAGNOSIS — Z1231 Encounter for screening mammogram for malignant neoplasm of breast: Secondary | ICD-10-CM

## 2018-07-09 ENCOUNTER — Other Ambulatory Visit: Payer: Self-pay | Admitting: Family Medicine

## 2018-07-09 DIAGNOSIS — Z7984 Long term (current) use of oral hypoglycemic drugs: Secondary | ICD-10-CM | POA: Diagnosis not present

## 2018-07-09 DIAGNOSIS — G35 Multiple sclerosis: Secondary | ICD-10-CM | POA: Diagnosis not present

## 2018-07-09 DIAGNOSIS — Z0000 Encounter for general adult medical examination without abnormal findings: Secondary | ICD-10-CM | POA: Diagnosis not present

## 2018-07-09 DIAGNOSIS — E2839 Other primary ovarian failure: Secondary | ICD-10-CM

## 2018-07-09 DIAGNOSIS — F419 Anxiety disorder, unspecified: Secondary | ICD-10-CM | POA: Diagnosis not present

## 2018-07-09 DIAGNOSIS — B373 Candidiasis of vulva and vagina: Secondary | ICD-10-CM | POA: Diagnosis not present

## 2018-07-09 DIAGNOSIS — Z23 Encounter for immunization: Secondary | ICD-10-CM | POA: Diagnosis not present

## 2018-07-09 DIAGNOSIS — E559 Vitamin D deficiency, unspecified: Secondary | ICD-10-CM | POA: Diagnosis not present

## 2018-07-09 DIAGNOSIS — E1169 Type 2 diabetes mellitus with other specified complication: Secondary | ICD-10-CM | POA: Diagnosis not present

## 2018-07-09 DIAGNOSIS — I10 Essential (primary) hypertension: Secondary | ICD-10-CM | POA: Diagnosis not present

## 2018-07-09 DIAGNOSIS — E782 Mixed hyperlipidemia: Secondary | ICD-10-CM | POA: Diagnosis not present

## 2018-07-13 DIAGNOSIS — E1169 Type 2 diabetes mellitus with other specified complication: Secondary | ICD-10-CM | POA: Diagnosis not present

## 2018-07-16 ENCOUNTER — Telehealth: Payer: Self-pay | Admitting: Neurology

## 2018-07-16 NOTE — Telephone Encounter (Signed)
Blood work was done to the primary care physician on 09 July 2018.  Hemoglobin A1c was 6.2, Leukos 120, BUN of 19, creatinine 0.68, sodium 142, potassium 4.0, chloride 104, CO2 31, calcium 9.8, total protein 6.5, albumin of 4.5, liver profile is unremarkable.  Vitamin D level is 46.1, total cholesterol of 179, LDL 108, HDL of 49.

## 2018-07-22 ENCOUNTER — Ambulatory Visit
Admission: RE | Admit: 2018-07-22 | Discharge: 2018-07-22 | Disposition: A | Discharge status: Home / Self Care | Payer: PPO | Source: Ambulatory Visit | Attending: Family Medicine | Admitting: Family Medicine

## 2018-07-22 DIAGNOSIS — Z1231 Encounter for screening mammogram for malignant neoplasm of breast: Secondary | ICD-10-CM

## 2018-08-11 ENCOUNTER — Other Ambulatory Visit: Payer: Self-pay | Admitting: Internal Medicine

## 2018-08-20 ENCOUNTER — Other Ambulatory Visit: Payer: Self-pay | Admitting: Neurology

## 2018-09-21 ENCOUNTER — Other Ambulatory Visit: Payer: Self-pay | Admitting: Neurology

## 2018-09-21 ENCOUNTER — Other Ambulatory Visit: Payer: Self-pay

## 2018-09-21 DIAGNOSIS — G35 Multiple sclerosis: Secondary | ICD-10-CM

## 2018-09-21 DIAGNOSIS — K754 Autoimmune hepatitis: Secondary | ICD-10-CM

## 2018-09-21 DIAGNOSIS — Z5181 Encounter for therapeutic drug level monitoring: Secondary | ICD-10-CM

## 2018-09-24 ENCOUNTER — Other Ambulatory Visit: Payer: Self-pay | Admitting: Neurology

## 2018-09-24 DIAGNOSIS — Z5181 Encounter for therapeutic drug level monitoring: Secondary | ICD-10-CM

## 2018-09-24 DIAGNOSIS — G35 Multiple sclerosis: Secondary | ICD-10-CM

## 2018-09-29 DIAGNOSIS — E119 Type 2 diabetes mellitus without complications: Secondary | ICD-10-CM | POA: Diagnosis not present

## 2018-09-29 DIAGNOSIS — I10 Essential (primary) hypertension: Secondary | ICD-10-CM | POA: Diagnosis not present

## 2018-10-02 ENCOUNTER — Other Ambulatory Visit: Payer: PPO

## 2018-10-05 ENCOUNTER — Other Ambulatory Visit: Payer: Self-pay | Admitting: Neurology

## 2018-10-05 ENCOUNTER — Telehealth: Payer: Self-pay

## 2018-10-05 DIAGNOSIS — Z5181 Encounter for therapeutic drug level monitoring: Secondary | ICD-10-CM

## 2018-10-05 DIAGNOSIS — G35 Multiple sclerosis: Secondary | ICD-10-CM

## 2018-10-05 NOTE — Telephone Encounter (Signed)
[facility] Database Verified Tramadol LR 07-03-18 Qty: 270 Xanax LR 07-03-18 Qty: 90 Pending appointment 10-20-2018

## 2018-10-05 NOTE — Telephone Encounter (Signed)
Received fax from [facility] stating pt has been approved for pt assistance for Copaxone.   Effective date: 10/01/18-10/01/2019 MB RN.

## 2018-10-14 ENCOUNTER — Other Ambulatory Visit (INDEPENDENT_AMBULATORY_CARE_PROVIDER_SITE_OTHER): Payer: PPO

## 2018-10-14 DIAGNOSIS — I10 Essential (primary) hypertension: Secondary | ICD-10-CM | POA: Diagnosis not present

## 2018-10-14 DIAGNOSIS — E119 Type 2 diabetes mellitus without complications: Secondary | ICD-10-CM | POA: Diagnosis not present

## 2018-10-14 DIAGNOSIS — K754 Autoimmune hepatitis: Secondary | ICD-10-CM | POA: Diagnosis not present

## 2018-10-14 LAB — CBC WITH DIFFERENTIAL/PLATELET
BASOS ABS: 0 10*3/uL (ref 0.0–0.1)
BASOS PCT: 0.9 % (ref 0.0–3.0)
EOS ABS: 0.2 10*3/uL (ref 0.0–0.7)
Eosinophils Relative: 3.3 % (ref 0.0–5.0)
HCT: 37 % (ref 36.0–46.0)
Hemoglobin: 12.9 g/dL (ref 12.0–15.0)
LYMPHS ABS: 1.2 10*3/uL (ref 0.7–4.0)
Lymphocytes Relative: 23.6 % (ref 12.0–46.0)
MCHC: 34.7 g/dL (ref 30.0–36.0)
MCV: 93.5 fl (ref 78.0–100.0)
Monocytes Absolute: 0.4 10*3/uL (ref 0.1–1.0)
Monocytes Relative: 8.4 % (ref 3.0–12.0)
NEUTROS ABS: 3.1 10*3/uL (ref 1.4–7.7)
NEUTROS PCT: 63.8 % (ref 43.0–77.0)
PLATELETS: 220 10*3/uL (ref 150.0–400.0)
RBC: 3.96 Mil/uL (ref 3.87–5.11)
RDW: 15.1 % (ref 11.5–15.5)
WBC: 4.9 10*3/uL (ref 4.0–10.5)

## 2018-10-15 ENCOUNTER — Other Ambulatory Visit: Payer: Self-pay

## 2018-10-15 DIAGNOSIS — R945 Abnormal results of liver function studies: Principal | ICD-10-CM

## 2018-10-15 DIAGNOSIS — R7989 Other specified abnormal findings of blood chemistry: Secondary | ICD-10-CM

## 2018-10-20 ENCOUNTER — Encounter: Payer: Self-pay | Admitting: Neurology

## 2018-10-20 ENCOUNTER — Ambulatory Visit (INDEPENDENT_AMBULATORY_CARE_PROVIDER_SITE_OTHER): Payer: PPO | Admitting: Neurology

## 2018-10-20 VITALS — BP 148/86 | HR 88

## 2018-10-20 DIAGNOSIS — Z5181 Encounter for therapeutic drug level monitoring: Secondary | ICD-10-CM

## 2018-10-20 DIAGNOSIS — G35 Multiple sclerosis: Secondary | ICD-10-CM

## 2018-10-20 DIAGNOSIS — N3000 Acute cystitis without hematuria: Secondary | ICD-10-CM

## 2018-10-20 DIAGNOSIS — R269 Unspecified abnormalities of gait and mobility: Secondary | ICD-10-CM | POA: Diagnosis not present

## 2018-10-20 DIAGNOSIS — R300 Dysuria: Secondary | ICD-10-CM | POA: Diagnosis not present

## 2018-10-20 NOTE — Progress Notes (Signed)
Reason for visit: Multiple sclerosis  Gabriela Rice is an 63 y.o. female  History of present illness:  Gabriela Rice is a 63 year old right-handed white female with a history of multiple sclerosis and autoimmune hepatitis.  The patient is on Imuran for her hepatitis issue, she is on Copaxone for her multiple sclerosis.  She has a paraparesis, her ability to ambulate has been quite limited, she may be able to walk short distances with a walker to and from the bathroom.  She may fatigue out and require a Rollator to get around the house.  The patient has felt well last week but over the weekend she began feeling more wiped out in general, she has no focal numbness or weakness of the face, arms, legs.  She has a neurogenic bladder and has chronic problems with constipation, she takes MiraLAX daily.  She is followed through a urology doctor.  She has not had any recent falls.  She denies any fevers or chills.  She returns to this office for an evaluation.  She does report some blurring of vision, but no sudden loss of vision.  Past Medical History:  Diagnosis Date  . Abnormality of gait 02/16/2013  . Allergic rhinitis   . Allergy   . Anxiety   . Autoimmune hepatitis ([place])   . Autoimmune hepatitis ([place])   . Colon polyps   . Diverticulosis   . DM (diabetes mellitus) ([place])   . Elevated LFTs   . Hemorrhoids   . Hemorrhoids   . Hepatitis    with cholestasis  . Hyperlipidemia   . Hypertension   . Migraines   . Multiple sclerosis ([place])   . Obesity   . Vitamin D deficiency     Past Surgical History:  Procedure Laterality Date  . ABDOMINAL HYSTERECTOMY    . CESAREAN SECTION     x 2  . MYOECTOMY    . TONSILLECTOMY AND ADENOIDECTOMY      Family History  Problem Relation Age of Onset  . Diabetes Mother   . Multiple sclerosis Mother   . Cirrhosis Father   . Heart disease Maternal Grandmother   . Colon cancer Maternal Aunt   . Uterine cancer Maternal Aunt   . Brain cancer  Maternal Uncle   . Colon cancer Maternal Uncle   . Breast cancer Neg Hx     Social history:  reports that she quit smoking about 23 years ago. Her smoking use included cigarettes. She has a 1.00 pack-year smoking history. She has never used smokeless tobacco. She reports that she does not drink alcohol or use drugs.    Allergies  Allergen Reactions  . Penicillins Other (See Comments)    seizure as child Has patient had a PCN reaction causing immediate rash, facial/tongue/throat swelling, SOB or lightheadedness with hypotension: No Has patient had a PCN reaction causing severe rash involving mucus membranes or skin necrosis:No Has patient had a PCN reaction that required hospitalizationNo Has patient had a PCN reaction occurring within the last 10 years:No If all of the above answers are "NO", then may proceed with Cephalosporin use.    . Codeine Swelling    hands swell    Medications:  Prior to Admission medications   Medication Sig Start Date End Date Taking? Authorizing Provider  ALPRAZolam Duanne Moron) 1 MG tablet TAKE 1 TABLET BY MOUTH AT BEDTIME AS NEEDED 10/05/18  Yes Kathrynn Ducking, MD  aspirin EC 81 MG tablet Take 81 mg by mouth daily.  Yes [provider]  azaTHIOprine (IMURAN) 50 MG tablet TAKE 3 TABLET AT THE SAME TIME BY MOUTH EVERY DAY 08/11/18  Yes Irene Shipper, MD  baclofen (LIORESAL) 10 MG tablet TAKE 1 TABLET BY MOUTH THREE TIMES DAILY 08/20/18  Yes Kathrynn Ducking, MD  cephALEXin (KEFLEX) 250 MG capsule Take 1 capsule by mouth daily. 03/06/18  Yes [provider]  cetirizine (ZYRTEC) 10 MG tablet Take 10 mg by mouth daily.   Yes [provider]  Cholecalciferol (VITAMIN D) 2000 UNITS CAPS Take 2,000 Units by mouth daily.    Yes [provider]  COPAXONE 40 MG/ML SOSY INJECT 40 MG (1 ML) UNDER THE SKIN THREE TIMES A WEEK 02/19/18  Yes Kathrynn Ducking, MD  DULOXETINE HCL PO Take 90 mg by mouth.   Yes [provider]    escitalopram (LEXAPRO) 10 MG tablet Take 10 mg by mouth daily. 08/14/15  Yes [provider]  gabapentin (NEURONTIN) 300 MG capsule TAKE 1 CAPSULE BY MOUTH THREE TIMES A DAY 07/03/18  Yes Kathrynn Ducking, MD  ibuprofen (ADVIL,MOTRIN) 200 MG tablet Take 200 mg by mouth every 6 (six) hours as needed for pain.   Yes [provider]  losartan (COZAAR) 100 MG tablet Take 100 mg by mouth daily.  09/02/13  Yes [provider]  metFORMIN (GLUCOPHAGE) 500 MG tablet Take 500 mg by mouth 2 (two) times daily with a meal.  03/15/13  Yes [provider]  polyethylene glycol powder (GLYCOLAX/MIRALAX) powder Take one scoop in 8 oz of water daily 09/07/15  Yes Love, Pamela S, PA-C  polyethylene glycol powder (GLYCOLAX/MIRALAX) powder take 17GM (DISSOLVED IN WATER) by mouth once daily 05/26/17  Yes Irene Shipper, MD  polyethylene glycol powder (GLYCOLAX/MIRALAX) powder TAKE 17GM(DISSOLVED IN WATER) BY MOUTH ONCE DAILY 05/11/18  Yes Irene Shipper, MD  pravastatin (PRAVACHOL) 40 MG tablet Take 40 mg by mouth daily.  07/04/14  Yes [provider]  predniSONE (DELTASONE) 10 MG tablet Begin taking 6 tablets daily, taper by one tablet every other day until off the medication. 05/12/18  Yes Kathrynn Ducking, MD  traMADol (ULTRAM) 50 MG tablet TAKE 1 TABLET BY MOUTH THREE TIMES DAILY AS NEEDED 10/05/18  Yes Kathrynn Ducking, MD    ROS:  Out of a complete 14 system review of symptoms, the patient complains only of the following symptoms, and all other reviewed systems are negative.  Generalized fatigue Weakness Walking problem  Blood pressure (!) 148/86, pulse 88.  Physical Exam  General: The patient is alert and cooperative at the time of the examination.  The patient is moderately obese.  Skin: No significant peripheral edema is noted.   Neurologic Exam  Mental status: The patient is alert and oriented x 3 at the time of the examination. The patient has apparent normal  recent and remote memory, with an apparently normal attention span and concentration ability.   Cranial nerves: Facial symmetry is present. Speech is normal, no aphasia or dysarthria is noted. Extraocular movements are full. Visual fields are full.   Pupils ar equal, round, and reactive to light.  Discs are flat bilaterally.e  Motor: The patient has good strength in the upper extremities.  With the lower extremities, there is 2/5 strength with hip flexion, she has 4/5 strength with knee flexion extension, she has bilateral foot drops, left greater than right.  Sensory examination: Soft touch sensation is symmetric on the face, arms, and legs.  Coordination:  The patient has good finger-nose-finger bilaterally.  The patient is not able perform heel-to-shin on either side.  Gait and station: The patient has the ability to stand with assistance, the has difficulty maintaining the standing posture on her own.  She was not ambulated.  Reflexes: Deep tendon reflexes are symmetric.   Assessment/Plan:  1.  Multiple sclerosis  2.  Paraparesis  3.  Neurogenic bladder  The patient will undergo blood work today and urinalysis.  The patient will rest, if the fatigue issues worsen over time, she may require treatment with steroids.  She will contact our office.  The patient will continue on Copaxone, she will follow-up in 6 months.  The patient will need MRI of the brain in the summer 2020.  The last MRI of the brain was done in 2017.  Jill Alexanders MD 10/20/2018 3:02 PM  [facility] [address] [place], [place] [address]  Phone [phone] Fax [phone]

## 2018-10-21 ENCOUNTER — Telehealth: Payer: Self-pay | Admitting: Neurology

## 2018-10-21 LAB — MICROSCOPIC EXAMINATION: Casts: NONE SEEN /lpf

## 2018-10-21 LAB — URINALYSIS, ROUTINE W REFLEX MICROSCOPIC
Bilirubin, UA: NEGATIVE
Ketones, UA: NEGATIVE
Nitrite, UA: POSITIVE — AB
PROTEIN UA: NEGATIVE
RBC, UA: NEGATIVE
SPEC GRAV UA: 1.026 (ref 1.005–1.030)
Urobilinogen, Ur: 0.2 mg/dL (ref 0.2–1.0)
pH, UA: 5 (ref 5.0–7.5)

## 2018-10-21 LAB — COMPREHENSIVE METABOLIC PANEL
A/G RATIO: 2.1 (ref 1.2–2.2)
ALT: 21 IU/L (ref 0–32)
AST: 28 IU/L (ref 0–40)
Albumin: 4.4 g/dL (ref 3.8–4.8)
Alkaline Phosphatase: 56 IU/L (ref 39–117)
BUN/Creatinine Ratio: 26 (ref 12–28)
BUN: 18 mg/dL (ref 8–27)
Bilirubin Total: 0.5 mg/dL (ref 0.0–1.2)
CHLORIDE: 99 mmol/L (ref 96–106)
CO2: 25 mmol/L (ref 20–29)
Calcium: 9.8 mg/dL (ref 8.7–10.3)
Creatinine, Ser: 0.7 mg/dL (ref 0.57–1.00)
GFR calc Af Amer: 107 mL/min/{1.73_m2} (ref >59)
GFR calc non Af Amer: 93 mL/min/{1.73_m2} (ref >59)
Globulin, Total: 2.1 g/dL (ref 1.5–4.5)
Glucose: 86 mg/dL (ref 65–99)
Potassium: 4.4 mmol/L (ref 3.5–5.2)
Sodium: 140 mmol/L (ref 134–144)
Total Protein: 6.5 g/dL (ref 6.0–8.5)

## 2018-10-21 LAB — CBC WITH DIFFERENTIAL/PLATELET
Basophils Absolute: 0 10*3/uL (ref 0.0–0.2)
Basos: 1 %
EOS (ABSOLUTE): 0.1 10*3/uL (ref 0.0–0.4)
Eos: 3 %
Hematocrit: 34.8 % (ref 34.0–46.6)
Hemoglobin: 12.4 g/dL (ref 11.1–15.9)
Immature Grans (Abs): 0 10*3/uL (ref 0.0–0.1)
Immature Granulocytes: 0 %
Lymphocytes Absolute: 1 10*3/uL (ref 0.7–3.1)
Lymphs: 23 %
MCH: 32.3 pg (ref 26.6–33.0)
MCHC: 35.6 g/dL (ref 31.5–35.7)
MCV: 91 fL (ref 79–97)
Monocytes Absolute: 0.4 10*3/uL (ref 0.1–0.9)
Monocytes: 9 %
Neutrophils Absolute: 2.7 10*3/uL (ref 1.4–7.0)
Neutrophils: 64 %
Platelets: 232 10*3/uL (ref 150–450)
RBC: 3.84 x10E6/uL (ref 3.77–5.28)
RDW: 13.5 % (ref 11.7–15.4)
WBC: 4.2 10*3/uL (ref 3.4–10.8)

## 2018-10-21 MED ORDER — SULFAMETHOXAZOLE-TRIMETHOPRIM 400-80 MG PO TABS: 1.0000 | ORAL_TABLET | Freq: Two times a day (BID) | ORAL | 0 refills | Status: DC

## 2018-10-21 NOTE — Telephone Encounter (Signed)
I called the patient.  Blood work is unremarkable, urinalysis shows evidence of a urinary tract infection.  The patient is allergic to penicillin, I will call in a prescription for Bactrim for 5 days.

## 2018-10-28 ENCOUNTER — Other Ambulatory Visit: Payer: Self-pay | Admitting: Neurology

## 2018-10-28 DIAGNOSIS — N3000 Acute cystitis without hematuria: Secondary | ICD-10-CM

## 2018-10-29 ENCOUNTER — Other Ambulatory Visit: Payer: Self-pay

## 2018-10-29 DIAGNOSIS — N3000 Acute cystitis without hematuria: Secondary | ICD-10-CM

## 2018-10-29 NOTE — Addendum Note (Signed)
Addended by: Inis Sizer D on: 10/29/2018 09:22 AM   Modules accepted: Orders

## 2018-10-30 LAB — URINALYSIS, ROUTINE W REFLEX MICROSCOPIC
Bilirubin, UA: NEGATIVE
Ketones, UA: NEGATIVE
Leukocytes, UA: NEGATIVE
Nitrite, UA: NEGATIVE
Protein, UA: NEGATIVE
RBC, UA: NEGATIVE
Specific Gravity, UA: 1.022 (ref 1.005–1.030)
Urobilinogen, Ur: 0.2 mg/dL (ref 0.2–1.0)
pH, UA: 5 (ref 5.0–7.5)

## 2018-10-31 LAB — URINE CULTURE

## 2018-11-01 ENCOUNTER — Telehealth: Payer: Self-pay | Admitting: Neurology

## 2018-11-01 MED ORDER — CIPROFLOXACIN HCL 250 MG PO TABS: 250.0000 mg | ORAL_TABLET | Freq: Two times a day (BID) | ORAL | 0 refills | Status: DC

## 2018-11-01 NOTE — Telephone Encounter (Signed)
I called the patient.  The urine culture is positive for E. coli sensitive to Cipro, I will call in a prescription for this.  The bacteria is resistant to Bactrim which was used before.

## 2019-01-21 DIAGNOSIS — Z7984 Long term (current) use of oral hypoglycemic drugs: Secondary | ICD-10-CM | POA: Diagnosis not present

## 2019-01-21 DIAGNOSIS — G35 Multiple sclerosis: Secondary | ICD-10-CM | POA: Diagnosis not present

## 2019-01-21 DIAGNOSIS — E1169 Type 2 diabetes mellitus with other specified complication: Secondary | ICD-10-CM | POA: Diagnosis not present

## 2019-01-21 DIAGNOSIS — I10 Essential (primary) hypertension: Secondary | ICD-10-CM | POA: Diagnosis not present

## 2019-01-21 DIAGNOSIS — E782 Mixed hyperlipidemia: Secondary | ICD-10-CM | POA: Diagnosis not present

## 2019-01-21 DIAGNOSIS — L259 Unspecified contact dermatitis, unspecified cause: Secondary | ICD-10-CM | POA: Diagnosis not present

## 2019-01-26 ENCOUNTER — Other Ambulatory Visit: Payer: Self-pay | Admitting: Neurology

## 2019-02-14 ENCOUNTER — Other Ambulatory Visit: Payer: Self-pay | Admitting: Internal Medicine

## 2019-02-14 ENCOUNTER — Other Ambulatory Visit: Payer: Self-pay | Admitting: Neurology

## 2019-02-16 DIAGNOSIS — N390 Urinary tract infection, site not specified: Secondary | ICD-10-CM | POA: Diagnosis not present

## 2019-02-16 DIAGNOSIS — E1165 Type 2 diabetes mellitus with hyperglycemia: Secondary | ICD-10-CM | POA: Diagnosis not present

## 2019-02-16 DIAGNOSIS — E782 Mixed hyperlipidemia: Secondary | ICD-10-CM | POA: Diagnosis not present

## 2019-02-16 DIAGNOSIS — Z7984 Long term (current) use of oral hypoglycemic drugs: Secondary | ICD-10-CM | POA: Diagnosis not present

## 2019-02-24 DIAGNOSIS — N390 Urinary tract infection, site not specified: Secondary | ICD-10-CM | POA: Diagnosis not present

## 2019-02-26 ENCOUNTER — Telehealth: Payer: Self-pay | Admitting: Neurology

## 2019-02-26 DIAGNOSIS — G35 Multiple sclerosis: Secondary | ICD-10-CM

## 2019-02-26 NOTE — Telephone Encounter (Signed)
Pt states her MS is getting worse and its the worse its ever been. She would like to know if she can receive "dripps" to help her out. Please advise.

## 2019-03-01 MED ORDER — PREDNISONE 10 MG PO TABS: ORAL_TABLET | ORAL | 0 refills | Status: DC

## 2019-03-01 NOTE — Telephone Encounter (Signed)
I reached out to the pt to further discuss this message. Patient stated over the last month she has had an increase in bilateral leg stiffness and on 5/29 and 5/30 it worsened. Patient states she is now having trouble bending her legs to get in and out of bed and her legs have a heavy feeling.   Patient states in the past when she has struggled with this she would have home health come and administer a Solumedrol infusion over a few days.  I advised I would FWD to MD to review and pt was agreeable.

## 2019-03-01 NOTE — Telephone Encounter (Signed)
I called the patient.  The patient has had some change in her ability to walk over the last month but is gotten much worse over the last week, she just recently was treated for a urinary tract infection, she did have a check on her diabetes, her hemoglobin A1c was 6.2.  I will try to get a revisit within the next week or 2, will get blood work at that time.  I will get her set up for 3-day course of Solu-Medrol, 500 mg each day.  She will go on a prednisone taper after that.

## 2019-03-01 NOTE — Telephone Encounter (Signed)
I reached out to the pt and I was able to schedule her for 03/16/19 at 11 am with a check in time of 10:30 am. Pt understands check in process will take place outside, temp will be checked and she will be asked to wait in her car until RN calls her back.  Pt will be accompanied by husband due to her being wheel chair bound.

## 2019-03-03 NOTE — Telephone Encounter (Signed)
I reached out to the pt and advised Solumedrol order has been [facility] to [facility] and we are waiting on next steps from [facility]. Pt was advised I would call her back once I received further information.

## 2019-03-04 NOTE — Telephone Encounter (Signed)
I reached out to [facility] because I haven't received the fax confirmation of the approval. I provided the fax for [facility] so approval could be sent straight there once finalized. Fax # for [facility] is [phone].  I requested a faxed to be sent straight to our office as well. I advised our clinical staff will not be in the office tomorrow and we would be unable to fax this document since it is needed by 10 am tomorrow.

## 2019-03-04 NOTE — Telephone Encounter (Signed)
I reached out to the pt and advised I had received a message from [facility] and the order is being processed. Pt was advised she should be hearing soon about the next steps. Pt verbalized understanding and appreciation for the call.

## 2019-03-04 NOTE — Telephone Encounter (Signed)
Gabriela Rice with [facility] returned my call and I was able to relay information. Once fax is received I will fax to [facility]

## 2019-03-04 NOTE — Telephone Encounter (Signed)
Pt has called to inform that she has not heard from the Home health RN re: her infusions. Please call

## 2019-03-04 NOTE — Telephone Encounter (Signed)
This message was received from [facility] with [facility].  [place] folks. Just learned from our Insurance team we will need to have someone from your team call her insurance plan for formulary override.  Plan phone #: [phone]  Subscriber ID # [id number]   We have RN ready to see her tomorrow at 10 AM. Thanks for helping [place] help this pt.   Pam    I reached out and spoke with Tim with [facility]. I advised I needed to submit a PA for Solumedrol 500 mg over 3 days. Pt was able to assist with getting me connected over to [facility] ( pharmacist) and PA was completed.  I received verbal confirmation that Solu-Medrol 2 gm has been approved and was advised I would be receiving the fax confirmation in 30-45 mins.  Refer # for call [id number].  I left a vm for Pam to call me back so I could update on this status.  [facility] # was provided.

## 2019-03-06 DIAGNOSIS — G35 Multiple sclerosis: Secondary | ICD-10-CM | POA: Diagnosis not present

## 2019-03-08 DIAGNOSIS — G35 Multiple sclerosis: Secondary | ICD-10-CM | POA: Diagnosis not present

## 2019-03-09 NOTE — Telephone Encounter (Signed)
Late entry ~ on 03/04/19 fax confirmation was received from [facility] for the Solu-Medrol infusion. Copy was sent to [facility] and copy filed at [facility].

## 2019-03-11 NOTE — Telephone Encounter (Signed)
Late Entry~ Received fax on 03/08/19 from [facility] for Dr. Jannifer Franklin to sign the order for Solu-Medrol 500 IV q24x 3 days. Dr. Jannifer Franklin signed and we faxed on 03/11/19.

## 2019-03-16 ENCOUNTER — Encounter: Payer: Self-pay | Admitting: Neurology

## 2019-03-16 ENCOUNTER — Telehealth: Payer: Self-pay | Admitting: Neurology

## 2019-03-16 ENCOUNTER — Ambulatory Visit (INDEPENDENT_AMBULATORY_CARE_PROVIDER_SITE_OTHER): Payer: PPO | Admitting: Neurology

## 2019-03-16 ENCOUNTER — Other Ambulatory Visit: Payer: Self-pay

## 2019-03-16 VITALS — BP 139/88 | HR 86 | Temp 98.2°F

## 2019-03-16 DIAGNOSIS — K754 Autoimmune hepatitis: Secondary | ICD-10-CM | POA: Diagnosis not present

## 2019-03-16 DIAGNOSIS — N3000 Acute cystitis without hematuria: Secondary | ICD-10-CM | POA: Diagnosis not present

## 2019-03-16 DIAGNOSIS — G35 Multiple sclerosis: Secondary | ICD-10-CM | POA: Diagnosis not present

## 2019-03-16 DIAGNOSIS — R269 Unspecified abnormalities of gait and mobility: Secondary | ICD-10-CM | POA: Diagnosis not present

## 2019-03-16 DIAGNOSIS — Z5181 Encounter for therapeutic drug level monitoring: Secondary | ICD-10-CM

## 2019-03-16 MED ORDER — BACLOFEN 10 MG PO TABS: ORAL_TABLET | ORAL | 1 refills | Status: DC

## 2019-03-16 NOTE — Progress Notes (Signed)
Reason for visit: Multiple sclerosis, gait disorder  Gabriela Rice is an 63 y.o. female  History of present illness:  Gabriela Rice is a 63 year old right-handed white female with a history of multiple sclerosis associated with a spastic paraparesis.  The patient at baseline has a chronic gait disorder with very limited capacity to ambulate.  On a good day, she can walk with a walker up to 100 feet, on some days she is unable to ambulate at all.  She mainly uses her wheelchair for mobility.  She has noted an increase in spasticity in the lower extremities, right greater than left that has occurred recently, she was treated for a bladder infection and currently is off therapy for this, but she does take cephalexin on a daily basis for preventative therapy for bladder infection.  She has not noted any change in function of the arms or any vision changes.  Her last MRI of the brain and cervical spine was done 3 years ago.  The patient does have urinary incontinence.  She reports no falls recently.  She may have spasticity of the legs at nighttime, but usually the worst time of day is in late afternoon.  She is on baclofen taking 10 mg 3 times daily.  She takes Copaxone as a disease modifying agent for her multiple sclerosis but she is also on Imuran for her autoimmune hepatitis.  She received a 3-day course of Solu-Medrol recently, she felt bad about 5 days after the treatment but has started to normalize with her leg strength more recently.  Past Medical History:  Diagnosis Date  . Abnormality of gait 02/16/2013  . Allergic rhinitis   . Allergy   . Anxiety   . Autoimmune hepatitis ([place])   . Autoimmune hepatitis ([place])   . Colon polyps   . Diverticulosis   . DM (diabetes mellitus) ([place])   . Elevated LFTs   . Hemorrhoids   . Hemorrhoids   . Hepatitis    with cholestasis  . Hyperlipidemia   . Hypertension   . Migraines   . Multiple sclerosis ([place])   . Obesity   . Vitamin D deficiency      Past Surgical History:  Procedure Laterality Date  . ABDOMINAL HYSTERECTOMY    . CESAREAN SECTION     x 2  . MYOECTOMY    . TONSILLECTOMY AND ADENOIDECTOMY      Family History  Problem Relation Age of Onset  . Diabetes Mother   . Multiple sclerosis Mother   . Cirrhosis Father   . Heart disease Maternal Grandmother   . Colon cancer Maternal Aunt   . Uterine cancer Maternal Aunt   . Brain cancer Maternal Uncle   . Colon cancer Maternal Uncle   . Breast cancer Neg Hx     Social history:  reports that she quit smoking about 23 years ago. Her smoking use included cigarettes. She has a 1.00 pack-year smoking history. She has never used smokeless tobacco. She reports that she does not drink alcohol or use drugs.    Allergies  Allergen Reactions  . Penicillins Other (See Comments)    seizure as child Has patient had a PCN reaction causing immediate rash, facial/tongue/throat swelling, SOB or lightheadedness with hypotension: No Has patient had a PCN reaction causing severe rash involving mucus membranes or skin necrosis:No Has patient had a PCN reaction that required hospitalizationNo Has patient had a PCN reaction occurring within the last 10 years:No If all of the  above answers are "NO", then may proceed with Cephalosporin use.    . Codeine Swelling    hands swell    Medications:  Prior to Admission medications   Medication Sig Start Date End Date Taking? Authorizing Provider  ALPRAZolam Duanne Moron) 1 MG tablet TAKE 1 TABLET BY MOUTH AT BEDTIME AS NEEDED 10/05/18  Yes Kathrynn Ducking, MD  aspirin EC 81 MG tablet Take 81 mg by mouth daily.   Yes [provider]  azaTHIOprine (IMURAN) 50 MG tablet TAKE 3 TABLET AT THE SAME TIME BY MOUTH EVERY DAY 02/15/19  Yes Irene Shipper, MD  baclofen (LIORESAL) 10 MG tablet One tablet in the morning and evening and 1.5 tablets at midday 03/16/19  Yes Kathrynn Ducking, MD  cetirizine (ZYRTEC) 10 MG tablet Take 10 mg by mouth daily.    Yes [provider]  Cholecalciferol (VITAMIN D) 2000 UNITS CAPS Take 2,000 Units by mouth daily.    Yes [provider]  COPAXONE 40 MG/ML SOSY INJECT 40MG  (1ML) UNDER THE SKIN THREE TIMES A WEEK. 01/26/19  Yes Kathrynn Ducking, MD  DULOXETINE HCL PO Take 90 mg by mouth.   Yes [provider]  escitalopram (LEXAPRO) 10 MG tablet Take 10 mg by mouth daily. 08/14/15  Yes [provider]  gabapentin (NEURONTIN) 300 MG capsule TAKE 1 CAPSULE BY MOUTH THREE TIMES A DAY 07/03/18  Yes Kathrynn Ducking, MD  ibuprofen (ADVIL,MOTRIN) 200 MG tablet Take 200 mg by mouth every 6 (six) hours as needed for pain.   Yes [provider]  losartan (COZAAR) 100 MG tablet Take 100 mg by mouth daily.  09/02/13  Yes [provider]  metFORMIN (GLUCOPHAGE) 500 MG tablet Take 500 mg by mouth 2 (two) times daily with a meal.  03/15/13  Yes [provider]  polyethylene glycol powder (GLYCOLAX/MIRALAX) powder Take one scoop in 8 oz of water daily 09/07/15  Yes Love, Pamela S, PA-C  polyethylene glycol powder (GLYCOLAX/MIRALAX) powder take 17GM (DISSOLVED IN WATER) by mouth once daily 05/26/17  Yes Irene Shipper, MD  polyethylene glycol powder (GLYCOLAX/MIRALAX) powder TAKE 17GM(DISSOLVED IN WATER) BY MOUTH ONCE DAILY 05/11/18  Yes Irene Shipper, MD  pravastatin (PRAVACHOL) 40 MG tablet Take 40 mg by mouth daily.  07/04/14  Yes [provider]  predniSONE (DELTASONE) 10 MG tablet Begin taking 6 tablets daily, taper by one tablet every other day until off the medication. 03/01/19  Yes Kathrynn Ducking, MD  traMADol (ULTRAM) 50 MG tablet TAKE 1 TABLET BY MOUTH THREE TIMES DAILY AS NEEDED 10/05/18  Yes Kathrynn Ducking, MD    ROS:  Out of a complete 14 system review of symptoms, the patient complains only of the following symptoms, and all other reviewed systems are negative.  Leg stiffness and weakness Walking problems Urinary incontinence  There were no  vitals taken for this visit.  Physical Exam  General: The patient is alert and cooperative at the time of the examination.  The patient is moderately to markedly obese.  Skin: No significant peripheral edema is noted.   Neurologic Exam  Mental status: The patient is alert and oriented x 3 at the time of the examination. The patient has apparent normal recent and remote memory, with an apparently normal attention span and concentration ability.   Cranial nerves: Facial symmetry is present. Speech is normal, no aphasia or dysarthria is noted. Extraocular movements are full. Visual fields are full.  Pupils are equal,  round, and reactive to light.  Discs are flat bilaterally.  Motor: The patient has good strength in the upper extremities.  With the lower extremities, there is 1-2/5 strength with hip flexion bilaterally, 2/5 strength with abduction of the thighs bilaterally, but the right side is weaker.  The patient has 4/5 strength with knee flexion and extension but bilateral significant foot drops seen.  Sensory examination: Soft touch sensation is symmetric on the face, arms, and legs.  Coordination: The patient has good finger-nose-finger bilaterally, she is unable to form heel shin on either side.  Gait and station: The patient can stand with assistance, she is unable to independently bear weight or ambulate.  Reflexes: Deep tendon reflexes are symmetric.   Assessment/Plan:  1.  Multiple sclerosis  2.  Spastic paraparesis  3.  Gait disorder  4.  Autoimmune hepatitis  The patient appears to have had an exacerbation of her underlying functional abilities associated with a recent bladder infection.  The spasticity in her lower extremities has worsened.  We will go up on the baclofen taking 10 mg in the morning and evening and 15 mg at midday.  A prescription was sent in.  She will be set up for MRI of the brain and cervical spine for follow-up of her multiple sclerosis.  She will  have blood work today and a repeat urinalysis.  The patient will follow-up here in 6 months.  She will remain on Copaxone for now.  Jill Alexanders MD 03/16/2019 11:34 AM  [facility] [address] [place], [place] [address]  Phone [phone] Fax [phone]

## 2019-03-16 NOTE — Telephone Encounter (Signed)
health team order sent to GI. No auth they will reach out to the patient to schedule.  

## 2019-03-17 ENCOUNTER — Telehealth: Payer: Self-pay | Admitting: Neurology

## 2019-03-17 LAB — CBC WITH DIFFERENTIAL/PLATELET
Basophils Absolute: 0 10*3/uL (ref 0.0–0.2)
Basos: 0 %
EOS (ABSOLUTE): 0.1 10*3/uL (ref 0.0–0.4)
Eos: 1 %
Hematocrit: 35.1 % (ref 34.0–46.6)
Hemoglobin: 12.3 g/dL (ref 11.1–15.9)
Immature Grans (Abs): 0.1 10*3/uL (ref 0.0–0.1)
Immature Granulocytes: 1 %
Lymphocytes Absolute: 1 10*3/uL (ref 0.7–3.1)
Lymphs: 12 %
MCH: 32.3 pg (ref 26.6–33.0)
MCHC: 35 g/dL (ref 31.5–35.7)
MCV: 92 fL (ref 79–97)
Monocytes Absolute: 0.5 10*3/uL (ref 0.1–0.9)
Monocytes: 6 %
Neutrophils Absolute: 6.6 10*3/uL (ref 1.4–7.0)
Neutrophils: 80 %
Platelets: 225 10*3/uL (ref 150–450)
RBC: 3.81 x10E6/uL (ref 3.77–5.28)
RDW: 14.4 % (ref 11.7–15.4)
WBC: 8.2 10*3/uL (ref 3.4–10.8)

## 2019-03-17 LAB — MICROSCOPIC EXAMINATION: Casts: NONE SEEN /lpf

## 2019-03-17 LAB — URINALYSIS, ROUTINE W REFLEX MICROSCOPIC
Bilirubin, UA: NEGATIVE
Ketones, UA: NEGATIVE
Nitrite, UA: POSITIVE — AB
Protein,UA: NEGATIVE
RBC, UA: NEGATIVE
Specific Gravity, UA: 1.022 (ref 1.005–1.030)
Urobilinogen, Ur: 0.2 mg/dL (ref 0.2–1.0)
pH, UA: 5 (ref 5.0–7.5)

## 2019-03-17 LAB — COMPREHENSIVE METABOLIC PANEL
ALT: 30 IU/L (ref 0–32)
AST: 18 IU/L (ref 0–40)
Albumin/Globulin Ratio: 2.3 — ABNORMAL HIGH (ref 1.2–2.2)
Albumin: 4.3 g/dL (ref 3.8–4.8)
Alkaline Phosphatase: 45 IU/L (ref 39–117)
BUN/Creatinine Ratio: 31 — ABNORMAL HIGH (ref 12–28)
BUN: 25 mg/dL (ref 8–27)
Bilirubin Total: 0.5 mg/dL (ref 0.0–1.2)
CO2: 25 mmol/L (ref 20–29)
Calcium: 9.5 mg/dL (ref 8.7–10.3)
Chloride: 98 mmol/L (ref 96–106)
Creatinine, Ser: 0.8 mg/dL (ref 0.57–1.00)
GFR calc Af Amer: 91 mL/min/{1.73_m2} (ref >59)
GFR calc non Af Amer: 79 mL/min/{1.73_m2} (ref >59)
Globulin, Total: 1.9 g/dL (ref 1.5–4.5)
Glucose: 191 mg/dL — ABNORMAL HIGH (ref 65–99)
Potassium: 3.9 mmol/L (ref 3.5–5.2)
Sodium: 137 mmol/L (ref 134–144)
Total Protein: 6.2 g/dL (ref 6.0–8.5)

## 2019-03-17 NOTE — Telephone Encounter (Signed)
I called the patient.  The comprehensive metabolic profile shows an elevated blood sugar of 191, otherwise unremarkable.  CBC is unremarkable.  The urinalysis suggests an ongoing urinary tract infection.  The patient appears to have a resistant strain of bacteria.  I will send the results to her primary care physician, the patient is followed by urology.  I have suggested that she go on vitamin C 500 mg 3 times daily and go on cranberry tablets daily.  She will likely need another urine culture to determine how to treat the ongoing infection.  If she has a hypotonic bladder, it is possible she may need in and out catheterizations.

## 2019-03-23 ENCOUNTER — Other Ambulatory Visit: Payer: Self-pay

## 2019-03-23 DIAGNOSIS — N390 Urinary tract infection, site not specified: Secondary | ICD-10-CM | POA: Diagnosis not present

## 2019-03-23 MED ORDER — BACLOFEN 10 MG PO TABS: ORAL_TABLET | ORAL | 1 refills | Status: DC

## 2019-04-02 ENCOUNTER — Other Ambulatory Visit: Payer: Self-pay | Admitting: Neurology

## 2019-04-02 DIAGNOSIS — Z5181 Encounter for therapeutic drug level monitoring: Secondary | ICD-10-CM

## 2019-04-02 DIAGNOSIS — G35 Multiple sclerosis: Secondary | ICD-10-CM

## 2019-04-06 ENCOUNTER — Other Ambulatory Visit: Payer: Self-pay

## 2019-04-06 ENCOUNTER — Encounter: Payer: Self-pay | Admitting: Internal Medicine

## 2019-04-06 ENCOUNTER — Ambulatory Visit (INDEPENDENT_AMBULATORY_CARE_PROVIDER_SITE_OTHER): Payer: PPO | Admitting: Internal Medicine

## 2019-04-06 VITALS — BP 159/85 | HR 93 | Temp 98.4°F | Wt 205.0 lb

## 2019-04-06 DIAGNOSIS — N390 Urinary tract infection, site not specified: Secondary | ICD-10-CM | POA: Diagnosis not present

## 2019-04-06 NOTE — Progress Notes (Signed)
RFV: hx of drug resistant PsA colonization -uropathogen  Patient ID: Gabriela Rice, female   DOB: Apr 12, 1956, 63 y.o.   MRN: [id number]  HPI  Gabriela Rice is a 63yo F with hx of MS on imuran, also T2DM, HTn, hx of recurrent uti, previously on chronic suppression with cephalexin - now no longer on anything. She is not currently on azo she takes prn for discomfort.  Last 4-5d feeling better, no dysuria, no urinary frequency  Allergies  Allergen Reactions  . Penicillins Other (See Comments)    seizure as child Has patient had a PCN reaction causing immediate rash, facial/tongue/throat swelling, SOB or lightheadedness with hypotension: No Has patient had a PCN reaction causing severe rash involving mucus membranes or skin necrosis:No Has patient had a PCN reaction that required hospitalizationNo Has patient had a PCN reaction occurring within the last 10 years:No If all of the above answers are "NO", then may proceed with Cephalosporin use.    . Codeine Swelling    hands swell    Outpatient Encounter Medications as of 04/06/2019  Medication Sig  . ALPRAZolam (XANAX) 1 MG tablet TAKE 1 TABLET BY MOUTH AT BEDTIME AS NEEDED  . aspirin EC 81 MG tablet Take 81 mg by mouth daily.  Marland Kitchen azaTHIOprine (IMURAN) 50 MG tablet TAKE 3 TABLET AT THE SAME TIME BY MOUTH EVERY DAY  . baclofen (LIORESAL) 10 MG tablet One tablet in the morning and evening and 1.5 tablets at midday  . cetirizine (ZYRTEC) 10 MG tablet Take 10 mg by mouth daily.  . Cholecalciferol (VITAMIN D) 2000 UNITS CAPS Take 2,000 Units by mouth daily.   Marland Kitchen COPAXONE 40 MG/ML SOSY INJECT 40MG  (1ML) UNDER THE SKIN THREE TIMES A WEEK.  . DULOXETINE HCL PO Take 90 mg by mouth.  . gabapentin (NEURONTIN) 300 MG capsule TAKE 1 CAPSULE BY MOUTH THREE TIMES A DAY  . ibuprofen (ADVIL,MOTRIN) 200 MG tablet Take 200 mg by mouth every 6 (six) hours as needed for pain.  Marland Kitchen losartan (COZAAR) 100 MG tablet Take 100 mg by mouth daily.   . metFORMIN  (GLUCOPHAGE) 500 MG tablet Take 500 mg by mouth 2 (two) times daily with a meal.   . polyethylene glycol powder (GLYCOLAX/MIRALAX) powder Take one scoop in 8 oz of water daily  . polyethylene glycol powder (GLYCOLAX/MIRALAX) powder take 17GM (DISSOLVED IN WATER) by mouth once daily  . polyethylene glycol powder (GLYCOLAX/MIRALAX) powder TAKE 17GM(DISSOLVED IN WATER) BY MOUTH ONCE DAILY  . pravastatin (PRAVACHOL) 40 MG tablet Take 40 mg by mouth daily.   . traMADol (ULTRAM) 50 MG tablet TAKE 1 TABLET BY MOUTH THREE TIMES DAILY AS NEEDED  . escitalopram (LEXAPRO) 10 MG tablet Take 10 mg by mouth daily.  . predniSONE (DELTASONE) 10 MG tablet Begin taking 6 tablets daily, taper by one tablet every other day until off the medication. (Patient not taking: Reported on 04/06/2019)   No facility-administered encounter medications on file as of 04/06/2019.      Patient Active Problem List   Diagnosis Date Noted  . Benign paroxysmal positional vertigo 09/06/2015  . Constipation   . Weakness of both legs   . Essential hypertension   . Type 2 diabetes mellitus with diabetic neuropathy ([place])   . Morbid obesity ([place])   . Generalized anxiety disorder   . Lumbar spondylosis with myelopathy   . Dehydration   . Chronic UTI   . Multiple sclerosis exacerbation ([place]) 08/20/2015  . Abdominal pain 08/20/2015  . Autoimmune hepatitis (  [place]) 08/20/2015  . Diabetes mellitus type 2, controlled ([place]) 08/20/2015  . Hypertension 08/20/2015  . Right-sided Lumbar Facet Syndrome 07/13/2015  . Lumbar spondylosis 07/13/2015  . Chronic low back pain 07/13/2015  . Trigger point with back pain 07/13/2015  . Myofascial pain syndrome 07/13/2015  . Chronic pain 07/13/2015  . Chronic pain syndrome 07/13/2015  . Generalized weakness 07/13/2015  . Right-sided chronic neck pain 07/13/2015  . Cervical spondylosis 07/13/2015  . DDD (degenerative disc disease), cervical 07/13/2015  . Cervical facet syndrome 07/13/2015  . DDD  (degenerative disc disease), lumbar 07/13/2015  . Pain of right lower extremity 07/13/2015  . Chronic radicular lumbar pain 07/13/2015  . Non-insulin dependent type 2 diabetes mellitus ([place]) 07/13/2015  . History of hysterectomy 07/13/2015  . History of C-section 07/13/2015  . DM (diabetes mellitus) ([place]) 07/13/2015  . Hyperlipidemia 07/13/2015  . Obesity 07/13/2015  . Diverticulosis 07/13/2015  . Lumbago 02/02/2014  . Difficulty in walking(719.7) 02/02/2014  . HBP (high blood pressure) 07/11/2013  . Chronic rhinitis 04/08/2013  . Abnormality of gait 02/16/2013  . Elevated liver function tests 02/12/2013  . Abdominal pain, right upper quadrant 02/12/2013  . Nausea alone 02/12/2013  . Dyspnea 02/04/2013  . Hypoxemia, nocturnal only  02/04/2013  . Encounter for long-term (current) use of other medications 10/14/2012  . Multiple sclerosis ([place]) 10/14/2012  . IRRITABLE BOWEL SYNDROME 03/29/2009  . FATTY LIVER DISEASE 03/29/2009  . Nonspecific elevation of levels of transaminase or lactic acid dehydrogenase (LDH) 03/29/2009  . PERSONAL HX COLONIC POLYPS 03/29/2009     Health Maintenance Due  Topic Date Due  . PNEUMOCOCCAL POLYSACCHARIDE VACCINE AGE 63-64 HIGH RISK  05/05/1958  . FOOT EXAM  05/05/1966  . OPHTHALMOLOGY EXAM  05/05/1966  . HIV Screening  05/06/1971  . PAP SMEAR-Modifier  05/05/1977  . COLONOSCOPY  05/05/2006  . HEMOGLOBIN A1C  02/19/2016  . TETANUS/TDAP  06/11/2016     Review of Systems 12 point ros is negative except what is mentioned above Physical Exam   BP (!) 159/85   Pulse 93   Temp 98.4 F (36.9 C) (Oral)   Wt 205 lb (93 kg)   BMI 35.19 kg/m   gen = a x o by 3 in NAD HEENT = MMM. OP clear Pulm= ctab, no w/c/r Cors= nl s1,s2, no g/m/r Ext = no c/c/e Lab Results  Component Value Date   HEPBSAB NONREACTIVE 03/29/2013   No results found for: RPR, LABRPR  CBC Lab Results  Component Value Date   WBC 8.2 03/16/2019   RBC 3.81 03/16/2019    HGB 12.3 03/16/2019   HCT 35.1 03/16/2019   PLT 225 03/16/2019   MCV 92 03/16/2019   MCH 32.3 03/16/2019   MCHC 35.0 03/16/2019   RDW 14.4 03/16/2019   LYMPHSABS 1.0 03/16/2019   MONOABS 0.4 10/14/2018   EOSABS 0.1 03/16/2019    BMET Lab Results  Component Value Date   NA 137 03/16/2019   K 3.9 03/16/2019   CL 98 03/16/2019   CO2 25 03/16/2019   GLUCOSE 191 (H) 03/16/2019   BUN 25 03/16/2019   CREATININE 0.80 03/16/2019   CALCIUM 9.5 03/16/2019   GFRNONAA 79 03/16/2019   GFRAA 91 03/16/2019      Assessment and Plan Hx of MDRO recurrent UTI = appears that she is not symptomatic at this time and would not likely need treatment  When symptomatic, will do ua and urine culture  Due to hx of mdro, can try to do  a trial of fosfomycin empirically while urine cx is pending/finalizing, then decide if needs iv abtx  Discussed treatment plan with patient

## 2019-04-07 ENCOUNTER — Other Ambulatory Visit: Payer: Self-pay | Admitting: Neurology

## 2019-04-13 ENCOUNTER — Other Ambulatory Visit: Payer: Self-pay

## 2019-04-13 ENCOUNTER — Other Ambulatory Visit: Payer: PPO

## 2019-04-13 DIAGNOSIS — N390 Urinary tract infection, site not specified: Secondary | ICD-10-CM

## 2019-04-15 ENCOUNTER — Telehealth: Payer: Self-pay | Admitting: Neurology

## 2019-04-15 ENCOUNTER — Other Ambulatory Visit: Payer: Self-pay | Admitting: Neurology

## 2019-04-15 LAB — URINALYSIS, ROUTINE W REFLEX MICROSCOPIC
Bilirubin Urine: NEGATIVE
Hgb urine dipstick: NEGATIVE
Hyaline Cast: NONE SEEN /LPF
Ketones, ur: NEGATIVE
Nitrite: POSITIVE — AB
Protein, ur: NEGATIVE
Specific Gravity, Urine: 1.019 (ref 1.001–1.03)
Squamous Epithelial / HPF: NONE SEEN /HPF (ref <=5)
pH: <=5 (ref 5.0–8.0)

## 2019-04-15 LAB — URINE CULTURE
MICRO NUMBER:: 666213
SPECIMEN QUALITY:: ADEQUATE

## 2019-04-15 MED ORDER — NITROFURANTOIN MONOHYD MACRO 100 MG PO CAPS: 100.0000 mg | ORAL_CAPSULE | Freq: Two times a day (BID) | ORAL | 0 refills | Status: DC

## 2019-04-15 MED ORDER — PREDNISONE 10 MG PO TABS: ORAL_TABLET | ORAL | 0 refills | Status: DC

## 2019-04-15 NOTE — Telephone Encounter (Signed)
I contacted the pt~ she states her gait/balance has declined severely since last office visit.  Pt states this decline has been steady since her last visit. Pt reports the solu-medrol back in June helped and wanted to know if Dr. Jannifer Franklin would be agreeable to another round? Pt states she has not fallen but has caught her self on house hold items to keep from doing so.   Pt was advised of [facility] hours due to covid 19 and understands clinical staff will not be present on 04/16/2019.

## 2019-04-15 NOTE — Telephone Encounter (Signed)
I called the patient.  The patient over the last week is had a gradual decline in her ability to ambulate.  Her primary physician has checked a urinalysis that shows evidence of bladder infection with E. coli, sensitive to nitrofurantoin.  The patient is allergic to penicillin, I will send in a prescription for the nitrofurantoin.

## 2019-04-15 NOTE — Telephone Encounter (Signed)
Pt is requesting a call back to discuss her unsteady gait she states she has lost her balance completly

## 2019-04-19 ENCOUNTER — Telehealth: Payer: Self-pay

## 2019-04-19 DIAGNOSIS — N390 Urinary tract infection, site not specified: Secondary | ICD-10-CM

## 2019-04-19 NOTE — Telephone Encounter (Signed)
Calling for urinalysis results.  Please advise.    Laverle Patter, RN

## 2019-04-20 ENCOUNTER — Ambulatory Visit: Payer: PPO | Admitting: Neurology

## 2019-04-20 MED ORDER — AMOXICILLIN-POT CLAVULANATE 875-125 MG PO TABS: 1.0000 | ORAL_TABLET | Freq: Two times a day (BID) | ORAL | 0 refills | Status: DC

## 2019-04-20 NOTE — Telephone Encounter (Signed)
Per Verbal from Dr Baxter Flattery called the patient and advise she has ecoli in her urine and she would like her to start Augmentin 875 twice a day for 5 days and it was sent to her pharmacy. Patient understands and will pick medication up today.

## 2019-05-04 ENCOUNTER — Emergency Department (HOSPITAL_COMMUNITY)
Admission: EM | Admit: 2019-05-04 | Discharge: 2019-05-04 | Disposition: A | Discharge status: Home / Self Care | Payer: PPO | Attending: Emergency Medicine | Admitting: Emergency Medicine

## 2019-05-04 ENCOUNTER — Encounter (HOSPITAL_COMMUNITY): Payer: Self-pay

## 2019-05-04 ENCOUNTER — Emergency Department (HOSPITAL_COMMUNITY): Payer: PPO

## 2019-05-04 DIAGNOSIS — S99911A Unspecified injury of right ankle, initial encounter: Secondary | ICD-10-CM | POA: Diagnosis present

## 2019-05-04 DIAGNOSIS — W19XXXA Unspecified fall, initial encounter: Secondary | ICD-10-CM | POA: Diagnosis not present

## 2019-05-04 DIAGNOSIS — S82831A Other fracture of upper and lower end of right fibula, initial encounter for closed fracture: Secondary | ICD-10-CM | POA: Diagnosis not present

## 2019-05-04 DIAGNOSIS — S82434A Nondisplaced oblique fracture of shaft of right fibula, initial encounter for closed fracture: Secondary | ICD-10-CM | POA: Diagnosis not present

## 2019-05-04 DIAGNOSIS — S82891A Other fracture of right lower leg, initial encounter for closed fracture: Secondary | ICD-10-CM

## 2019-05-04 DIAGNOSIS — Z87891 Personal history of nicotine dependence: Secondary | ICD-10-CM | POA: Insufficient documentation

## 2019-05-04 DIAGNOSIS — W010XXA Fall on same level from slipping, tripping and stumbling without subsequent striking against object, initial encounter: Secondary | ICD-10-CM | POA: Diagnosis not present

## 2019-05-04 DIAGNOSIS — Y92009 Unspecified place in unspecified non-institutional (private) residence as the place of occurrence of the external cause: Secondary | ICD-10-CM | POA: Diagnosis not present

## 2019-05-04 DIAGNOSIS — E119 Type 2 diabetes mellitus without complications: Secondary | ICD-10-CM | POA: Diagnosis not present

## 2019-05-04 DIAGNOSIS — G35 Multiple sclerosis: Secondary | ICD-10-CM | POA: Diagnosis not present

## 2019-05-04 DIAGNOSIS — I10 Essential (primary) hypertension: Secondary | ICD-10-CM | POA: Insufficient documentation

## 2019-05-04 DIAGNOSIS — Z79899 Other long term (current) drug therapy: Secondary | ICD-10-CM | POA: Insufficient documentation

## 2019-05-04 DIAGNOSIS — S300XXA Contusion of lower back and pelvis, initial encounter: Secondary | ICD-10-CM | POA: Diagnosis not present

## 2019-05-04 DIAGNOSIS — S92152A Displaced avulsion fracture (chip fracture) of left talus, initial encounter for closed fracture: Secondary | ICD-10-CM | POA: Insufficient documentation

## 2019-05-04 DIAGNOSIS — M25571 Pain in right ankle and joints of right foot: Secondary | ICD-10-CM | POA: Diagnosis not present

## 2019-05-04 DIAGNOSIS — Z794 Long term (current) use of insulin: Secondary | ICD-10-CM | POA: Insufficient documentation

## 2019-05-04 DIAGNOSIS — R52 Pain, unspecified: Secondary | ICD-10-CM | POA: Diagnosis not present

## 2019-05-04 DIAGNOSIS — M533 Sacrococcygeal disorders, not elsewhere classified: Secondary | ICD-10-CM | POA: Diagnosis not present

## 2019-05-04 DIAGNOSIS — Z7982 Long term (current) use of aspirin: Secondary | ICD-10-CM | POA: Insufficient documentation

## 2019-05-04 DIAGNOSIS — Y998 Other external cause status: Secondary | ICD-10-CM | POA: Insufficient documentation

## 2019-05-04 DIAGNOSIS — Z209 Contact with and (suspected) exposure to unspecified communicable disease: Secondary | ICD-10-CM | POA: Diagnosis not present

## 2019-05-04 DIAGNOSIS — Y9389 Activity, other specified: Secondary | ICD-10-CM | POA: Insufficient documentation

## 2019-05-04 DIAGNOSIS — R0902 Hypoxemia: Secondary | ICD-10-CM | POA: Diagnosis not present

## 2019-05-04 DIAGNOSIS — S3992XA Unspecified injury of lower back, initial encounter: Secondary | ICD-10-CM | POA: Diagnosis not present

## 2019-05-04 MED ORDER — ONDANSETRON HCL 4 MG PO TABS
4.0000 mg | ORAL_TABLET | Freq: Once | ORAL | Status: AC
  Administered 2019-05-04: 12:00:00 4 mg via ORAL
  Filled 2019-05-04: qty 1

## 2019-05-04 MED ORDER — HYDROCODONE-ACETAMINOPHEN 5-325 MG PO TABS: 1.0000 | ORAL_TABLET | ORAL | 0 refills | Status: DC | PRN

## 2019-05-04 MED ORDER — ACETAMINOPHEN 500 MG PO TABS
1000.0000 mg | ORAL_TABLET | Freq: Once | ORAL | Status: AC
  Administered 2019-05-04: 12:00:00 1000 mg via ORAL
  Filled 2019-05-04: qty 2

## 2019-05-04 MED ORDER — TRAMADOL HCL 50 MG PO TABS
50.0000 mg | ORAL_TABLET | Freq: Once | ORAL | Status: AC
  Administered 2019-05-04: 50 mg via ORAL
  Filled 2019-05-04: qty 1

## 2019-05-04 NOTE — ED Triage Notes (Signed)
Pt reports has MS.  This morning around 0330, pt got up and fell.  C/O pain to r ankle.

## 2019-05-04 NOTE — ED Provider Notes (Signed)
[facility] EMERGENCY DEPARTMENT Provider Note   CSN: [id number] Arrival date & time: 05/04/19  1018     History   Chief Complaint Chief Complaint  Patient presents with   Ankle Pain    HPI Gabriela Rice is a 63 y.o. female.     Patient is a 63 year old female who presents to the emergency department with a complaint of right ankle pain.  The patient states that she has multiple sclerosis.  She got up to go to the bathroom around 330 this morning, fell and injured the right ankle.  She has been having pain and swelling since that time.  The patient denies any other injuries.  The patient denies being on any anticoagulation medications.  She is not had any recent operations or procedures involving the right lower extremity.  The pain is worse when she attempts to put weight on it it does improve slightly when she is sitting and has it elevated.  The history is provided by the patient.  Ankle Pain Associated symptoms: no back pain and no neck pain     Past Medical History:  Diagnosis Date   Abnormality of gait 02/16/2013   Allergic rhinitis    Allergy    Anxiety    Autoimmune hepatitis ([place])    Autoimmune hepatitis ([place])    Colon polyps    Diverticulosis    DM (diabetes mellitus) ([place])    Elevated LFTs    Hemorrhoids    Hemorrhoids    Hepatitis    with cholestasis   Hyperlipidemia    Hypertension    Migraines    Multiple sclerosis ([place])    Obesity    Vitamin D deficiency     Patient Active Problem List   Diagnosis Date Noted   Benign paroxysmal positional vertigo 09/06/2015   Constipation    Weakness of both legs    Essential hypertension    Type 2 diabetes mellitus with diabetic neuropathy ([place])    Morbid obesity ([facility])    Generalized anxiety disorder    Lumbar spondylosis with myelopathy    Dehydration    Chronic UTI    Multiple sclerosis exacerbation ([place]) 08/20/2015   Abdominal pain 08/20/2015   Autoimmune  hepatitis ([place]) 08/20/2015   Diabetes mellitus type 2, controlled ([place]) 08/20/2015   Hypertension 08/20/2015   Right-sided Lumbar Facet Syndrome 07/13/2015   Lumbar spondylosis 07/13/2015   Chronic low back pain 07/13/2015   Trigger point with back pain 07/13/2015   Myofascial pain syndrome 07/13/2015   Chronic pain 07/13/2015   Chronic pain syndrome 07/13/2015   Generalized weakness 07/13/2015   Right-sided chronic neck pain 07/13/2015   Cervical spondylosis 07/13/2015   DDD (degenerative disc disease), cervical 07/13/2015   Cervical facet syndrome 07/13/2015   DDD (degenerative disc disease), lumbar 07/13/2015   Pain of right lower extremity 07/13/2015   Chronic radicular lumbar pain 07/13/2015   Non-insulin dependent type 2 diabetes mellitus ([place]) 07/13/2015   History of hysterectomy 07/13/2015   History of C-section 07/13/2015   DM (diabetes mellitus) ([place]) 07/13/2015   Hyperlipidemia 07/13/2015   Obesity 07/13/2015   Diverticulosis 07/13/2015   Lumbago 02/02/2014   Difficulty in walking(719.7) 02/02/2014   HBP (high blood pressure) 07/11/2013   Chronic rhinitis 04/08/2013   Abnormality of gait 02/16/2013   Elevated liver function tests 02/12/2013   Abdominal pain, right upper quadrant 02/12/2013   Nausea alone 02/12/2013   Dyspnea 02/04/2013   Hypoxemia, nocturnal only  02/04/2013   Encounter for long-term (  current) use of other medications 10/14/2012   Multiple sclerosis ([place]) 10/14/2012   IRRITABLE BOWEL SYNDROME 03/29/2009   FATTY LIVER DISEASE 03/29/2009   Nonspecific elevation of levels of transaminase or lactic acid dehydrogenase (LDH) 03/29/2009   PERSONAL HX COLONIC POLYPS 03/29/2009    Past Surgical History:  Procedure Laterality Date   ABDOMINAL HYSTERECTOMY     CESAREAN SECTION     x 2   MYOECTOMY     TONSILLECTOMY AND ADENOIDECTOMY       OB History   No obstetric history on file.      Home  Medications    Prior to Admission medications   Medication Sig Start Date End Date Taking? Authorizing Provider  ALPRAZolam Duanne Moron) 1 MG tablet TAKE 1 TABLET BY MOUTH AT BEDTIME AS NEEDED 04/07/19  Yes Kathrynn Ducking, MD  aspirin EC 81 MG tablet Take 81 mg by mouth daily.   Yes [provider]  azaTHIOprine (IMURAN) 50 MG tablet TAKE 3 TABLET AT THE SAME TIME BY MOUTH EVERY DAY 02/15/19  Yes Irene Shipper, MD  baclofen (LIORESAL) 10 MG tablet One tablet in the morning and evening and 1.5 tablets at midday 03/23/19  Yes Kathrynn Ducking, MD  cetirizine (ZYRTEC) 10 MG tablet Take 10 mg by mouth daily.   Yes [provider]  Cholecalciferol (VITAMIN D) 2000 UNITS CAPS Take 2,000 Units by mouth daily.    Yes [provider]  COPAXONE 40 MG/ML SOSY INJECT 40MG  (1ML) UNDER THE SKIN THREE TIMES A WEEK. 01/26/19  Yes Kathrynn Ducking, MD  DULoxetine (CYMBALTA) 30 MG capsule Take 1 capsule by mouth daily. 05/03/19  Yes [provider]  gabapentin (NEURONTIN) 300 MG capsule TAKE 1 CAPSULE BY MOUTH THREE TIMES A DAY 04/15/19  Yes Kathrynn Ducking, MD  HUMALOG KWIKPEN 100 UNIT/ML KwikPen 2-10 Units 3 (three) times daily. Sliding scale 03/05/19  Yes [provider]  ibuprofen (ADVIL,MOTRIN) 200 MG tablet Take 200 mg by mouth every 6 (six) hours as needed for pain.   Yes [provider]  losartan (COZAAR) 100 MG tablet Take 100 mg by mouth daily.  09/02/13  Yes [provider]  metFORMIN (GLUCOPHAGE) 500 MG tablet Take 500 mg by mouth 2 (two) times daily with a meal.  03/15/13  Yes [provider]  polyethylene glycol powder (GLYCOLAX/MIRALAX) powder take 17GM (DISSOLVED IN WATER) by mouth once daily 05/26/17  Yes Irene Shipper, MD  pravastatin (PRAVACHOL) 40 MG tablet Take 40 mg by mouth daily.  07/04/14  Yes [provider]  traMADol (ULTRAM) 50 MG tablet TAKE 1 TABLET BY MOUTH THREE TIMES DAILY AS NEEDED 04/05/19  Yes Melvenia Beam, MD   amoxicillin-clavulanate (AUGMENTIN) 875-125 MG tablet Take 1 tablet by mouth 2 (two) times daily. Patient not taking: Reported on 05/04/2019 04/20/19   Carlyle Basques, MD  nitrofurantoin, macrocrystal-monohydrate, (MACROBID) 100 MG capsule Take 1 capsule (100 mg total) by mouth 2 (two) times daily. Patient not taking: Reported on 05/04/2019 04/15/19   Kathrynn Ducking, MD  polyethylene glycol powder [facility]) powder Take one scoop in 8 oz of water daily 09/07/15   Love, Ivan Anchors, PA-C  polyethylene glycol powder (GLYCOLAX/MIRALAX) powder TAKE 17GM(DISSOLVED IN WATER) BY MOUTH ONCE DAILY 05/11/18   Irene Shipper, MD  predniSONE (DELTASONE) 10 MG tablet Begin taking 6 tablets daily, taper by one tablet daily until off the medication. Patient not taking: Reported on 05/04/2019 04/15/19   Kathrynn Ducking, MD  Family History Family History  Problem Relation Age of Onset   Diabetes Mother    Multiple sclerosis Mother    Cirrhosis Father    Heart disease Maternal Grandmother    Colon cancer Maternal Aunt    Uterine cancer Maternal Aunt    Brain cancer Maternal Uncle    Colon cancer Maternal Uncle    Breast cancer Neg Hx     Social History Social History   Tobacco Use   Smoking status: Former Smoker    Packs/day: 0.25    Years: 4.00    Pack years: 1.00    Types: Cigarettes    Quit date: 10/01/1995    Years since quitting: 23.6   Smokeless tobacco: Never Used  Substance Use Topics   Alcohol use: No    Alcohol/week: 0.0 standard drinks   Drug use: No     Allergies   Penicillins and Codeine   Review of Systems Review of Systems  Constitutional: Negative for activity change and appetite change.  HENT: Negative for congestion, ear discharge, ear pain, facial swelling, nosebleeds, rhinorrhea, sneezing and tinnitus.   Eyes: Negative for photophobia, pain and discharge.  Respiratory: Negative for cough, choking, shortness of breath and wheezing.   Cardiovascular:  Negative for chest pain, palpitations and leg swelling.  Gastrointestinal: Negative for abdominal pain, blood in stool, constipation, diarrhea, nausea and vomiting.  Genitourinary: Negative for difficulty urinating, dysuria, flank pain, frequency and hematuria.  Musculoskeletal: Positive for arthralgias. Negative for back pain, gait problem, myalgias and neck pain.  Skin: Negative for color change, rash and wound.  Neurological: Negative for dizziness, seizures, syncope, facial asymmetry, speech difficulty, weakness and numbness.  Hematological: Negative for adenopathy. Does not bruise/bleed easily.  Psychiatric/Behavioral: Negative for agitation, confusion, hallucinations, self-injury and suicidal ideas. The patient is not nervous/anxious.      Physical Exam Updated Vital Signs BP (!) 157/81    Pulse 86    Temp 98.1 F (36.7 C) (Oral)    Resp 17    SpO2 95%   Physical Exam Vitals signs and nursing note reviewed.  Constitutional:      Appearance: She is well-developed. She is not toxic-appearing.  HENT:     Head: Normocephalic.     Comments: No abrasions or contusions noted of the head or scalp area.    Right Ear: Tympanic membrane and external ear normal.     Left Ear: Tympanic membrane and external ear normal.  Eyes:     General: Lids are normal.     Pupils: Pupils are equal, round, and reactive to light.  Neck:     Musculoskeletal: Normal range of motion and neck supple.     Vascular: No carotid bruit.  Cardiovascular:     Rate and Rhythm: Normal rate and regular rhythm.     Pulses: Normal pulses.     Heart sounds: Normal heart sounds.  Pulmonary:     Effort: No respiratory distress.     Breath sounds: Normal breath sounds.     Comments: There is symmetrical rise and fall of the chest.  Patient speaks in complete sentences without problem. Abdominal:     General: Bowel sounds are normal.     Palpations: Abdomen is soft.     Tenderness: There is no abdominal tenderness.  There is no guarding.  Musculoskeletal: Normal range of motion.     Right hip: Normal.     Right knee: Normal.     Right ankle: She exhibits swelling. Tenderness. Lateral malleolus  and medial malleolus tenderness found. Achilles tendon normal.     Comments: Coccyx area tenderness to palpation.  No palpable deformity.  No palpable step-off of the cervical, thoracic, or lumbar spine.  Lymphadenopathy:     Head:     Right side of head: No submandibular adenopathy.     Left side of head: No submandibular adenopathy.     Cervical: No cervical adenopathy.  Skin:    General: Skin is warm and dry.  Neurological:     Mental Status: She is alert and oriented to person, place, and time.     Cranial Nerves: No cranial nerve deficit.     Sensory: No sensory deficit.  Psychiatric:        Speech: Speech normal.      ED Treatments / Results  Labs (all labs ordered are listed, but only abnormal results are displayed) Labs Reviewed - No data to display  EKG None  Radiology Dg Sacrum/coccyx  Result Date: 05/04/2019 CLINICAL DATA:  RIGHT ankle and coccygeal pain post fall EXAM: SACRUM AND COCCYX - 2+ VIEW COMPARISON:  CT abdomen and pelvis 08/20/2015 FINDINGS: Osseous demineralization. Hip and SI joint spaces preserved. Sacral foramina symmetric. No definite acute sacrococcygeal fracture identified. IMPRESSION: No definite acute sacrococcygeal abnormalities. Electronically Signed   By: Lavonia Dana M.D.   On: 05/04/2019 12:54   Dg Ankle Complete Right  Result Date: 05/04/2019 CLINICAL DATA:  RIGHT ankle pain and coccygeal pain post fall EXAM: RIGHT ANKLE - COMPLETE 3+ VIEW COMPARISON:  08/21/2005 FINDINGS: Osseous demineralization. Scattered soft tissue swelling at RIGHT ankle. Minimal widening of the lateral ankle joint. Oblique fracture of the distal RIGHT fibula. However a thin linear calcific density is identified at the medial margin of the distal RIGHT tibial metaphysis suspicious for a  retinacular avulsion injury. Additionally, a tiny bone fragment is seen at the medial joint line which may represent an avulsion fracture though it is uncertain whether this arises from the medial malleolus or the talus. No additional fracture, dislocation or bone destruction. IMPRESSION: Oblique nondisplaced fracture of the distal RIGHT fibula. Suspected retinacular avulsion fracture at the medial margin of the distal RIGHT tibial metaphysis. Probable small avulsion fracture fragment at medial joint line, though uncertain as to whether this is of medial malleolar or adjacent talar origin. Electronically Signed   By: Lavonia Dana M.D.   On: 05/04/2019 12:51    Procedures Procedures (including critical care time) FRACTURE CARE RIGHT ANKLE.  Patient sustained a fall earlier this morning, and is been having pain in the right ankle since that time.  X-ray shows avulsion fracture of the margin of the distal tibia, and an oblique nondisplaced fracture of the distal fibula.  I discussed these fractures with the patient in terms of which he understands.  I discussed the need for immobilization.  The patient gives permission for the procedure.  Patient was identified by armband.  Procedural timeout was taken.  The patient was then fitted with a cam walker.  Crutches were offered, but the patient states that she cannot use crutches due to her MS.  She has a walker at home.  After application of the cam walker, there was no complaint of the device being too tight.  There are no temperature changes of the right lower extremity.  The patient tolerated the procedure without problem.  Medications Ordered in ED Medications  traMADol (ULTRAM) tablet 50 mg (50 mg Oral Given 05/04/19 1210)  acetaminophen (TYLENOL) tablet 1,000 mg (1,000 mg  Oral Given 05/04/19 1210)  ondansetron (ZOFRAN) tablet 4 mg (4 mg Oral Given 05/04/19 1210)     Initial Impression / Assessment and Plan / ED Course  I have reviewed the triage vital  signs and the nursing notes.  Pertinent labs & imaging results that were available during my care of the patient were reviewed by me and considered in my medical decision making (see chart for details).          Final Clinical Impressions(s) / ED Diagnoses MDM Patient sustained a fall early this morning.  She has had pain and swelling of the right ankle area.  She cannot stand to bear weight on it.  She presents now for assistance with this issue. No other injury reported except for soreness of the coccyx area.  And no other injury found on examination.  X-ray of the right ankle reveals an oblique nondisplaced fracture of the distal right fibula.  There is suspected a retinacular avulsion fracture at the medial margin of the distal right tibial metaphysis.  And there is also suspected an avulsion fracture at the medial joint line of the medial malleolus.  The patient was informed of the findings.  Patient was fitted with a cam walker.  Patient has a walker at home that she can use.  She declined the use of crutches. Prescription for pain medication given.  The patient will follow-up with orthopedics.   Final diagnoses:  Closed fracture of distal end of right fibula, unspecified fracture morphology, initial encounter  Avulsion fracture of ankle, right, closed, initial encounter  Contusion of coccyx, initial encounter    ED Discharge Orders         Ordered    HYDROcodone-acetaminophen (NORCO/VICODIN) 5-325 MG tablet  Every 4 hours PRN     05/04/19 [place], [place], PA-C 05/05/19 [place], [place], DO 05/08/19 [id number]

## 2019-05-04 NOTE — Discharge Instructions (Addendum)
The x-ray of your coccyx/tailbone is negative for fracture or dislocation.  The x-ray of your ankle shows a fracture of the small bone in your ankle because of the fibula, and an avulsion fracture of the larger bone called the tibia.  Please use your cam walker and crutches. Use tylenol extra strength for mild pain. Use norco for pain not improved by ultram.  Ultram and Norco may cause drowsiness, and/or lightheadedness.  Please use caution when getting around when using these medications.  Please do not drive, drink alcohol, operate machinery, or participate in activities requiring concentration when taking either these medications.  Please keep your leg elevated above your waist when you are sitting and above your heart when you are lying down. Please call Dr Aline Brochure for orthopedic follow up appointment.

## 2019-05-05 ENCOUNTER — Telehealth: Payer: Self-pay | Admitting: Orthopedic Surgery

## 2019-05-05 ENCOUNTER — Ambulatory Visit: Payer: PPO | Admitting: Internal Medicine

## 2019-05-05 NOTE — Telephone Encounter (Signed)
Ok

## 2019-05-05 NOTE — Telephone Encounter (Signed)
Called back to patient; scheduled virtual visit accordingly.

## 2019-05-05 NOTE — Telephone Encounter (Signed)
Called back to patient to offer appointment accordingly. Patient states that due to her MS, combined with the fractured ankle, she is unable to get out of her bed. States it took 3 people to get her from wheelchair into car after the emergency room visit. States then she had to have her son, and 2 firemen to transfer her from the car into her home. Said she does not know how she would get back out of her home to come to office; therefore, asking if she may have a telephone visit. Also states she was told by emergency room to keep the boot off while she is in bed. Please advise. Patient's phone# [phone]

## 2019-05-05 NOTE — Telephone Encounter (Signed)
OK 

## 2019-05-05 NOTE — Telephone Encounter (Signed)
Patient called following Emergency room visit at [facility] - states was given only Dr Ruthe Mannan name regarding follow up for problem: Closed fracture of distal end of right fibula, unspecified fracture  - please advise if okay to schedule (or with ortho on call).

## 2019-05-06 ENCOUNTER — Telehealth: Payer: Self-pay | Admitting: Radiology

## 2019-05-06 DIAGNOSIS — S82891A Other fracture of right lower leg, initial encounter for closed fracture: Secondary | ICD-10-CM

## 2019-05-06 NOTE — Telephone Encounter (Signed)
Her Doctor, general practice called today, wants you to know patient has a virtual visit tomorrow, the nurse is asking if you can order Home health for patient. Since she fell and fractured her ankle she has been in the bed.   Patient also interested in going to a nursing home while she recovers from injury.  I have advised her I will ask you if you can order the home health  Also advised if patient is interested in nursing home care, she must call her primary care, and we can advise her to call primary care when she has virtual visit tomorrow.

## 2019-05-06 NOTE — Telephone Encounter (Signed)
Yes

## 2019-05-07 ENCOUNTER — Other Ambulatory Visit: Payer: Self-pay

## 2019-05-07 ENCOUNTER — Ambulatory Visit (INDEPENDENT_AMBULATORY_CARE_PROVIDER_SITE_OTHER): Payer: PPO | Admitting: Orthopedic Surgery

## 2019-05-07 DIAGNOSIS — S82841A Displaced bimalleolar fracture of right lower leg, initial encounter for closed fracture: Secondary | ICD-10-CM

## 2019-05-07 NOTE — Progress Notes (Signed)
Virtual Visit via Video Note  I connected with Gabriela Rice on 05/07/19 at 11:50 AM EDT by a video enabled telemedicine application and verified that I am speaking with the correct person using two identifiers.  Location: Patient: AT HOME  Provider: OFFICE   I discussed the limitations of evaluation and management by telemedicine and the availability of in person appointments. The patient expressed understanding and agreed to proceed.  History of Present Illness: 63 year old female with severe MS fell at her home was seen in the ER diagnosed with a bimalleolar fracture placed in a fracture boot and sent home  The ED record is been included because this is a virtual visit  Chief Complaint    Chief Complaint  Patient presents with  . Ankle Pain    HPI Gabriela Rice is a 63 y.o. female.     Patient is a 63 year old female who presents to the emergency department with a complaint of right ankle pain.  The patient states that she has multiple sclerosis.  She got up to go to the bathroom around 330 this morning, fell and injured the right ankle.  She has been having pain and swelling since that time.  The patient denies any other injuries.  The patient denies being on any anticoagulation medications.  She is not had any recent operations or procedures involving the right lower extremity.  The pain is worse when she attempts to put weight on it it does improve slightly when she is sitting and has it elevated.  The history is provided by the patient.  Ankle Pain Associated symptoms: no back pain and no neck pain         Past Medical History:  Diagnosis Date  . Abnormality of gait 02/16/2013  . Allergic rhinitis   . Allergy   . Anxiety   . Autoimmune hepatitis ([place])   . Autoimmune hepatitis ([place])   . Colon polyps   . Diverticulosis   . DM (diabetes mellitus) ([place])   . Elevated LFTs   . Hemorrhoids   . Hemorrhoids   . Hepatitis    with cholestasis   . Hyperlipidemia   . Hypertension   . Migraines   . Multiple sclerosis ([place])   . Obesity   . Vitamin D deficiency         Patient Active Problem List   Diagnosis Date Noted  . Benign paroxysmal positional vertigo 09/06/2015  . Constipation   . Weakness of both legs   . Essential hypertension   . Type 2 diabetes mellitus with diabetic neuropathy ([place])   . Morbid obesity ([place])   . Generalized anxiety disorder   . Lumbar spondylosis with myelopathy   . Dehydration   . Chronic UTI   . Multiple sclerosis exacerbation ([place]) 08/20/2015  . Abdominal pain 08/20/2015  . Autoimmune hepatitis ([place]) 08/20/2015  . Diabetes mellitus type 2, controlled ([place]) 08/20/2015  . Hypertension 08/20/2015  . Right-sided Lumbar Facet Syndrome 07/13/2015  . Lumbar spondylosis 07/13/2015  . Chronic low back pain 07/13/2015  . Trigger point with back pain 07/13/2015  . Myofascial pain syndrome 07/13/2015  . Chronic pain 07/13/2015  . Chronic pain syndrome 07/13/2015  . Generalized weakness 07/13/2015  . Right-sided chronic neck pain 07/13/2015  . Cervical spondylosis 07/13/2015  . DDD (degenerative disc disease), cervical 07/13/2015  . Cervical facet syndrome 07/13/2015  . DDD (degenerative disc disease), lumbar 07/13/2015  . Pain of right lower extremity 07/13/2015  . Chronic radicular lumbar pain 07/13/2015  .  Non-insulin dependent type 2 diabetes mellitus ([place]) 07/13/2015  . History of hysterectomy 07/13/2015  . History of C-section 07/13/2015  . DM (diabetes mellitus) ([place]) 07/13/2015  . Hyperlipidemia 07/13/2015  . Obesity 07/13/2015  . Diverticulosis 07/13/2015  . Lumbago 02/02/2014  . Difficulty in walking(719.7) 02/02/2014  . HBP (high blood pressure) 07/11/2013  . Chronic rhinitis 04/08/2013  . Abnormality of gait 02/16/2013  . Elevated liver function tests 02/12/2013  . Abdominal pain, right upper quadrant 02/12/2013  . Nausea alone 02/12/2013  . Dyspnea  02/04/2013  . Hypoxemia, nocturnal only  02/04/2013  . Encounter for long-term (current) use of other medications 10/14/2012  . Multiple sclerosis ([place]) 10/14/2012  . IRRITABLE BOWEL SYNDROME 03/29/2009  . FATTY LIVER DISEASE 03/29/2009  . Nonspecific elevation of levels of transaminase or lactic acid dehydrogenase (LDH) 03/29/2009  . PERSONAL HX COLONIC POLYPS 03/29/2009    Past Surgical History:  Procedure Laterality Date  . ABDOMINAL HYSTERECTOMY    . CESAREAN SECTION     x 2  . MYOECTOMY    . TONSILLECTOMY AND ADENOIDECTOMY                  OB History   No obstetric history on file.      Home Medications               Prior to Admission medications   Medication Sig Start Date End Date Taking? Authorizing Provider  ALPRAZolam Duanne Moron) 1 MG tablet TAKE 1 TABLET BY MOUTH AT BEDTIME AS NEEDED 04/07/19  Yes Kathrynn Ducking, MD  aspirin EC 81 MG tablet Take 81 mg by mouth daily.   Yes [provider]  azaTHIOprine (IMURAN) 50 MG tablet TAKE 3 TABLET AT THE SAME TIME BY MOUTH EVERY DAY 02/15/19  Yes Irene Shipper, MD  baclofen (LIORESAL) 10 MG tablet One tablet in the morning and evening and 1.5 tablets at midday 03/23/19  Yes Kathrynn Ducking, MD  cetirizine (ZYRTEC) 10 MG tablet Take 10 mg by mouth daily.   Yes [provider]  Cholecalciferol (VITAMIN D) 2000 UNITS CAPS Take 2,000 Units by mouth daily.    Yes [provider]  COPAXONE 40 MG/ML SOSY INJECT 40MG  (1ML) UNDER THE SKIN THREE TIMES A WEEK. 01/26/19  Yes Kathrynn Ducking, MD  DULoxetine (CYMBALTA) 30 MG capsule Take 1 capsule by mouth daily. 05/03/19  Yes [provider]  gabapentin (NEURONTIN) 300 MG capsule TAKE 1 CAPSULE BY MOUTH THREE TIMES A DAY 04/15/19  Yes Kathrynn Ducking, MD  HUMALOG KWIKPEN 100 UNIT/ML KwikPen 2-10 Units 3 (three) times daily. Sliding scale 03/05/19  Yes [provider]  ibuprofen (ADVIL,MOTRIN) 200 MG tablet Take 200 mg by  mouth every 6 (six) hours as needed for pain.   Yes [provider]  losartan (COZAAR) 100 MG tablet Take 100 mg by mouth daily.  09/02/13  Yes [provider]  metFORMIN (GLUCOPHAGE) 500 MG tablet Take 500 mg by mouth 2 (two) times daily with a meal.  03/15/13  Yes [provider]  polyethylene glycol powder (GLYCOLAX/MIRALAX) powder take 17GM (DISSOLVED IN WATER) by mouth once daily 05/26/17  Yes Irene Shipper, MD  pravastatin (PRAVACHOL) 40 MG tablet Take 40 mg by mouth daily.  07/04/14  Yes [provider]  traMADol (ULTRAM) 50 MG tablet TAKE 1 TABLET BY MOUTH THREE TIMES DAILY AS NEEDED 04/05/19  Yes Melvenia Beam, MD  amoxicillin-clavulanate (AUGMENTIN) 875-125 MG tablet Take 1 tablet by  mouth 2 (two) times daily. Patient not taking: Reported on 05/04/2019 04/20/19   Carlyle Basques, MD  nitrofurantoin, macrocrystal-monohydrate, (MACROBID) 100 MG capsule Take 1 capsule (100 mg total) by mouth 2 (two) times daily. Patient not taking: Reported on 05/04/2019 04/15/19   Kathrynn Ducking, MD  polyethylene glycol powder [facility]) powder Take one scoop in 8 oz of water daily 09/07/15   Love, Ivan Anchors, PA-C  polyethylene glycol powder (GLYCOLAX/MIRALAX) powder TAKE 17GM(DISSOLVED IN WATER) BY MOUTH ONCE DAILY 05/11/18   Irene Shipper, MD  predniSONE (DELTASONE) 10 MG tablet Begin taking 6 tablets daily, taper by one tablet daily until off the medication. Patient not taking: Reported on 05/04/2019 04/15/19   Kathrynn Ducking, MD    Family History      Family History  Problem Relation Age of Onset  . Diabetes Mother   . Multiple sclerosis Mother   . Cirrhosis Father   . Heart disease Maternal Grandmother   . Colon cancer Maternal Aunt   . Uterine cancer Maternal Aunt   . Brain cancer Maternal Uncle   . Colon cancer Maternal Uncle   . Breast cancer Neg Hx     Social History Social History        Tobacco Use  . Smoking  status: Former Smoker    Packs/day: 0.25    Years: 4.00    Pack years: 1.00    Types: Cigarettes    Quit date: 10/01/1995    Years since quitting: 23.6  . Smokeless tobacco: Never Used  Substance Use Topics  . Alcohol use: No    Alcohol/week: 0.0 standard drinks  . Drug use: No     Allergies              Penicillins and Codeine   Review of Systems Review of Systems  Constitutional: Negative for activity change and appetite change.  HENT: Negative for congestion, ear discharge, ear pain, facial swelling, nosebleeds, rhinorrhea, sneezing and tinnitus.   Eyes: Negative for photophobia, pain and discharge.  Respiratory: Negative for cough, choking, shortness of breath and wheezing.   Cardiovascular: Negative for chest pain, palpitations and leg swelling.  Gastrointestinal: Negative for abdominal pain, blood in stool, constipation, diarrhea, nausea and vomiting.  Genitourinary: Negative for difficulty urinating, dysuria, flank pain, frequency and hematuria.  Musculoskeletal: Positive for arthralgias. Negative for back pain, gait problem, myalgias and neck pain.  Skin: Negative for color change, rash and wound.  Neurological: Negative for dizziness, seizures, syncope, facial asymmetry, speech difficulty, weakness and numbness.  Hematological: Negative for adenopathy. Does not bruise/bleed easily.  Psychiatric/Behavioral: Negative for agitation, confusion, hallucinations, self-injury and suicidal ideas. The patient is not nervous/anxious.      Physical Exam Updated Vital Signs BP (!) 157/81   Pulse 86   Temp 98.1 F (36.7 C) (Oral)   Resp 17   SpO2 95%   Physical Exam Vitals signs and nursing note reviewed.  Constitutional:      Appearance: She is well-developed. She is not toxic-appearing.  HENT:     Head: Normocephalic.     Comments: No abrasions or contusions noted of the head or scalp area.    Right Ear: Tympanic membrane and external ear normal.      Left Ear: Tympanic membrane and external ear normal.  Eyes:     General: Lids are normal.     Pupils: Pupils are equal, round, and reactive to light.  Neck:     Musculoskeletal: Normal range of motion and  neck supple.     Vascular: No carotid bruit.  Cardiovascular:     Rate and Rhythm: Normal rate and regular rhythm.     Pulses: Normal pulses.     Heart sounds: Normal heart sounds.  Pulmonary:     Effort: No respiratory distress.     Breath sounds: Normal breath sounds.     Comments: There is symmetrical rise and fall of the chest.  Patient speaks in complete sentences without problem. Abdominal:     General: Bowel sounds are normal.     Palpations: Abdomen is soft.     Tenderness: There is no abdominal tenderness. There is no guarding.  Musculoskeletal: Normal range of motion.     Right hip: Normal.     Right knee: Normal.     Right ankle: She exhibits swelling. Tenderness. Lateral malleolus and medial malleolus tenderness found. Achilles tendon normal.     Comments: Coccyx area tenderness to palpation.  No palpable deformity.  No palpable step-off of the cervical, thoracic, or lumbar spine.  Lymphadenopathy:     Head:     Right side of head: No submandibular adenopathy.     Left side of head: No submandibular adenopathy.     Cervical: No cervical adenopathy.  Skin:    General: Skin is warm and dry.  Neurological:     Mental Status: She is alert and oriented to person, place, and time.     Cranial Nerves: No cranial nerve deficit.     Sensory: No sensory deficit.  Psychiatric:        Speech: Speech normal.      ED Treatments / Results  Labs (all labs ordered are listed, but only abnormal results are displayed) Labs Reviewed - No data to display  EKG None  Radiology  Imaging Results (Last 48 hours)  Dg Sacrum/coccyx  Result Date: 05/04/2019 CLINICAL DATA:  RIGHT ankle and coccygeal pain post fall EXAM: SACRUM AND COCCYX - 2+ VIEW COMPARISON:  CT  abdomen and pelvis 08/20/2015 FINDINGS: Osseous demineralization. Hip and SI joint spaces preserved. Sacral foramina symmetric. No definite acute sacrococcygeal fracture identified. IMPRESSION: No definite acute sacrococcygeal abnormalities. Electronically Signed   By: Lavonia Dana M.D.   On: 05/04/2019 12:54   Dg Ankle Complete Right  Result Date: 05/04/2019 CLINICAL DATA:  RIGHT ankle pain and coccygeal pain post fall EXAM: RIGHT ANKLE - COMPLETE 3+ VIEW COMPARISON:  08/21/2005 FINDINGS: Osseous demineralization. Scattered soft tissue swelling at RIGHT ankle. Minimal widening of the lateral ankle joint. Oblique fracture of the distal RIGHT fibula. However a thin linear calcific density is identified at the medial margin of the distal RIGHT tibial metaphysis suspicious for a retinacular avulsion injury. Additionally, a tiny bone fragment is seen at the medial joint line which may represent an avulsion fracture though it is uncertain whether this arises from the medial malleolus or the talus. No additional fracture, dislocation or bone destruction. IMPRESSION: Oblique nondisplaced fracture of the distal RIGHT fibula. Suspected retinacular avulsion fracture at the medial margin of the distal RIGHT tibial metaphysis. Probable small avulsion fracture fragment at medial joint line, though uncertain as to whether this is of medial malleolar or adjacent talar origin. Electronically Signed   By: Lavonia Dana M.D.   On: 05/04/2019 12:51     Procedures Procedures (including critical care time) FRACTURE CARE RIGHT ANKLE.  Patient sustained a fall earlier this morning, and is been having pain in the right ankle since that time.  X-ray shows avulsion  fracture of the margin of the distal tibia, and an oblique nondisplaced fracture of the distal fibula.  I discussed these fractures with the patient in terms of which he understands.  I discussed the need for immobilization.  The patient gives permission for the  procedure.  Patient was identified by armband.  Procedural timeout was taken.  The patient was then fitted with a cam walker.  Crutches were offered, but the patient states that she cannot use crutches due to her MS.  She has a walker at home.  After application of the cam walker, there was no complaint of the device being too tight.  There are no temperature changes of the right lower extremity.  The patient tolerated the procedure without problem.  Medications Ordered in ED Medications  traMADol (ULTRAM) tablet 50 mg (50 mg Oral Given 05/04/19 1210)  acetaminophen (TYLENOL) tablet 1,000 mg (1,000 mg Oral Given 05/04/19 1210)  ondansetron (ZOFRAN) tablet 4 mg (4 mg Oral Given 05/04/19 1210)     Initial Impression / Assessment and Plan / ED Course  I have reviewed the triage vital signs and the nursing notes.  Pertinent labs & imaging results that were available during my care of the patient were reviewed by me and considered in my medical decision making (see chart for details).          Final Clinical Impressions(s) / ED Diagnoses MDM Patient sustained a fall early this morning.  She has had pain and swelling of the right ankle area.  She cannot stand to bear weight on it.  She presents now for assistance with this issue. No other injury reported except for soreness of the coccyx area.  And no other injury found on examination.  X-ray of the right ankle reveals an oblique nondisplaced fracture of the distal right fibula.  There is suspected a retinacular avulsion fracture at the medial margin of the distal right tibial metaphysis.  And there is also suspected an avulsion fracture at the medial joint line of the medial malleolus.  The patient was informed of the findings.  Patient was fitted with a cam walker.  Patient has a walker at home that she can use.  She declined the use of crutches. Prescription for pain medication given.  The patient will follow-up with orthopedics.    Final diagnoses:  Closed fracture of distal end of right fibula, unspecified fracture morphology, initial encounter  Avulsion fracture of ankle, right, closed, initial encounter  Contusion of coccyx, initial encounter       ED Discharge Orders               Ordered     HYDROcodone-acetaminophen (NORCO/VICODIN) 5-325 MG tablet  Every 4 hours PRN     05/04/19 1315            Lily Kocher, PA-C 05/05/19 1635    I discussed the assessment and treatment plan with the patient. The patient was provided an opportunity to ask questions and all were answered. The patient agreed with the plan and demonstrated an understanding of the instructions.   The patient was advised to call back or seek an in-person evaluation if the symptoms worsen or if the condition fails to improve as anticipated.  I provided 11 minutes of non-face-to-face time during this encounter.   Gabriela Rice  05/07/2019  HISTORY SECTION :  No chief complaint on file.  64 years old fell at home injured her ankle notes that she has had severe MS  has had to have 2 steroid trips over the last few months at home  Still having difficulty maneuvering to the bathroom getting up because of the severe weakness  Pain is under control     She indicates she is not having any significant pain but persistent weakness Review of Systems  Musculoskeletal: Positive for falls.  Neurological: Positive for focal weakness and weakness.     Past Medical History:  Diagnosis Date  . Abnormality of gait 02/16/2013  . Allergic rhinitis   . Allergy   . Anxiety   . Autoimmune hepatitis ([place])   . Autoimmune hepatitis ([place])   . Colon polyps   . Diverticulosis   . DM (diabetes mellitus) ([place])   . Elevated LFTs   . Hemorrhoids   . Hemorrhoids   . Hepatitis    with cholestasis  . Hyperlipidemia   . Hypertension   . Migraines   . Multiple sclerosis ([place])   . Obesity   . Vitamin D deficiency     Past  Surgical History:  Procedure Laterality Date  . ABDOMINAL HYSTERECTOMY    . CESAREAN SECTION     x 2  . MYOECTOMY    . TONSILLECTOMY AND ADENOIDECTOMY       Allergies  Allergen Reactions  . Penicillins Other (See Comments)    seizure as child Has patient had a PCN reaction causing immediate rash, facial/tongue/throat swelling, SOB or lightheadedness with hypotension: No Has patient had a PCN reaction causing severe rash involving mucus membranes or skin necrosis:No Has patient had a PCN reaction that required hospitalizationNo Has patient had a PCN reaction occurring within the last 10 years:No If all of the above answers are "NO", then may proceed with Cephalosporin use.    . Codeine Swelling    hands swell     Current Outpatient Medications:  .  ALPRAZolam (XANAX) 1 MG tablet, TAKE 1 TABLET BY MOUTH AT BEDTIME AS NEEDED, Disp: 90 tablet, Rfl: 1 .  amoxicillin-clavulanate (AUGMENTIN) 875-125 MG tablet, Take 1 tablet by mouth 2 (two) times daily. (Patient not taking: Reported on 05/04/2019), Disp: 10 tablet, Rfl: 0 .  aspirin EC 81 MG tablet, Take 81 mg by mouth daily., Disp: , Rfl:  .  azaTHIOprine (IMURAN) 50 MG tablet, TAKE 3 TABLET AT THE SAME TIME BY MOUTH EVERY DAY, Disp: 270 tablet, Rfl: 1 .  baclofen (LIORESAL) 10 MG tablet, One tablet in the morning and evening and 1.5 tablets at midday, Disp: 315 tablet, Rfl: 1 .  cetirizine (ZYRTEC) 10 MG tablet, Take 10 mg by mouth daily., Disp: , Rfl:  .  Cholecalciferol (VITAMIN D) 2000 UNITS CAPS, Take 2,000 Units by mouth daily. , Disp: , Rfl:  .  COPAXONE 40 MG/ML SOSY, INJECT 40MG  (1ML) UNDER THE SKIN THREE TIMES A WEEK., Disp: 12 Syringe, Rfl: 2 .  DULoxetine (CYMBALTA) 30 MG capsule, Take 1 capsule by mouth daily., Disp: , Rfl:  .  gabapentin (NEURONTIN) 300 MG capsule, TAKE 1 CAPSULE BY MOUTH THREE TIMES A DAY, Disp: 270 capsule, Rfl: 2 .  HUMALOG KWIKPEN 100 UNIT/ML KwikPen, 2-10 Units 3 (three) times daily. Sliding scale,  Disp: , Rfl:  .  ibuprofen (ADVIL,MOTRIN) 200 MG tablet, Take 200 mg by mouth every 6 (six) hours as needed for pain., Disp: , Rfl:  .  losartan (COZAAR) 100 MG tablet, Take 100 mg by mouth daily. , Disp: , Rfl:  .  metFORMIN (GLUCOPHAGE) 500 MG tablet, Take 500 mg by mouth 2 (two)  times daily with a meal. , Disp: , Rfl:  .  nitrofurantoin, macrocrystal-monohydrate, (MACROBID) 100 MG capsule, Take 1 capsule (100 mg total) by mouth 2 (two) times daily. (Patient not taking: Reported on 05/04/2019), Disp: 12 capsule, Rfl: 0 .  polyethylene glycol powder (GLYCOLAX/MIRALAX) powder, Take one scoop in 8 oz of water daily, Disp: 527 g, Rfl: 3 .  polyethylene glycol powder (GLYCOLAX/MIRALAX) powder, take 17GM (DISSOLVED IN WATER) by mouth once daily, Disp: 765 g, Rfl: 3 .  polyethylene glycol powder (GLYCOLAX/MIRALAX) powder, TAKE 17GM(DISSOLVED IN WATER) BY MOUTH ONCE DAILY, Disp: 510 g, Rfl: 0 .  pravastatin (PRAVACHOL) 40 MG tablet, Take 40 mg by mouth daily. , Disp: , Rfl:  .  predniSONE (DELTASONE) 10 MG tablet, Begin taking 6 tablets daily, taper by one tablet daily until off the medication. (Patient not taking: Reported on 05/04/2019), Disp: 21 tablet, Rfl: 0 .  traMADol (ULTRAM) 50 MG tablet, TAKE 1 TABLET BY MOUTH THREE TIMES DAILY AS NEEDED, Disp: 270 tablet, Rfl: 0   PHYSICAL EXAM SECTION:  There were no vitals taken for this visit.  There is no height or weight on file to calculate BMI.  Psychological: Awake alert and oriented x3 mood and affect normal MSK: CANT LIFT FEET FROM MS, SLIDING DOWN WHEN SHE GETS UP  MEDICAL DECISION SECTION:  No diagnosis found.  Imaging NON DISPLACED BIMALLEOLAR RIGHT ANKLE FRACTURE   Plan:  (Rx., Inj., surg., Frx, MRI/CT, XR:2)  BOOT FOR NOW  PT AT HOME    11:50 AM Arther Abbott, MD  05/07/2019

## 2019-05-07 NOTE — Telephone Encounter (Signed)
Have asked [facility] if they will see her.

## 2019-05-07 NOTE — Telephone Encounter (Signed)
I have tried 2 home health agencies and can not get anyone to see her  They do not accept her insurance

## 2019-05-07 NOTE — Telephone Encounter (Signed)
Tim out of office, will try on Monday

## 2019-05-10 NOTE — Telephone Encounter (Signed)
[facility] will see her tomorrow.

## 2019-05-10 NOTE — Telephone Encounter (Signed)
Faxed to [facility] to advise them referral in Epic.

## 2019-05-11 ENCOUNTER — Telehealth: Payer: Self-pay | Admitting: Orthopedic Surgery

## 2019-05-11 DIAGNOSIS — Z86010 Personal history of colonic polyps: Secondary | ICD-10-CM | POA: Diagnosis not present

## 2019-05-11 DIAGNOSIS — Z7982 Long term (current) use of aspirin: Secondary | ICD-10-CM | POA: Diagnosis not present

## 2019-05-11 DIAGNOSIS — K5790 Diverticulosis of intestine, part unspecified, without perforation or abscess without bleeding: Secondary | ICD-10-CM | POA: Diagnosis not present

## 2019-05-11 DIAGNOSIS — E119 Type 2 diabetes mellitus without complications: Secondary | ICD-10-CM | POA: Diagnosis not present

## 2019-05-11 DIAGNOSIS — S82401D Unspecified fracture of shaft of right fibula, subsequent encounter for closed fracture with routine healing: Secondary | ICD-10-CM | POA: Diagnosis not present

## 2019-05-11 DIAGNOSIS — E669 Obesity, unspecified: Secondary | ICD-10-CM | POA: Diagnosis not present

## 2019-05-11 DIAGNOSIS — I10 Essential (primary) hypertension: Secondary | ICD-10-CM | POA: Diagnosis not present

## 2019-05-11 DIAGNOSIS — M47812 Spondylosis without myelopathy or radiculopathy, cervical region: Secondary | ICD-10-CM | POA: Diagnosis not present

## 2019-05-11 DIAGNOSIS — Z87891 Personal history of nicotine dependence: Secondary | ICD-10-CM | POA: Diagnosis not present

## 2019-05-11 DIAGNOSIS — Z6830 Body mass index (BMI) 30.0-30.9, adult: Secondary | ICD-10-CM | POA: Diagnosis not present

## 2019-05-11 DIAGNOSIS — G43909 Migraine, unspecified, not intractable, without status migrainosus: Secondary | ICD-10-CM | POA: Diagnosis not present

## 2019-05-11 DIAGNOSIS — E785 Hyperlipidemia, unspecified: Secondary | ICD-10-CM | POA: Diagnosis not present

## 2019-05-11 DIAGNOSIS — M4726 Other spondylosis with radiculopathy, lumbar region: Secondary | ICD-10-CM | POA: Diagnosis not present

## 2019-05-11 DIAGNOSIS — E559 Vitamin D deficiency, unspecified: Secondary | ICD-10-CM | POA: Diagnosis not present

## 2019-05-11 DIAGNOSIS — Z794 Long term (current) use of insulin: Secondary | ICD-10-CM | POA: Diagnosis not present

## 2019-05-11 DIAGNOSIS — F419 Anxiety disorder, unspecified: Secondary | ICD-10-CM | POA: Diagnosis not present

## 2019-05-11 DIAGNOSIS — M5030 Other cervical disc degeneration, unspecified cervical region: Secondary | ICD-10-CM | POA: Diagnosis not present

## 2019-05-11 DIAGNOSIS — G894 Chronic pain syndrome: Secondary | ICD-10-CM | POA: Diagnosis not present

## 2019-05-11 DIAGNOSIS — G35 Multiple sclerosis: Secondary | ICD-10-CM | POA: Diagnosis not present

## 2019-05-11 DIAGNOSIS — M5136 Other intervertebral disc degeneration, lumbar region: Secondary | ICD-10-CM | POA: Diagnosis not present

## 2019-05-11 DIAGNOSIS — Z9181 History of falling: Secondary | ICD-10-CM | POA: Diagnosis not present

## 2019-05-11 NOTE — Telephone Encounter (Signed)
I discussed with Gabriela Rice Have advised social worker eval for safety if appropriate He has advised patient to discuss with insurance and PCP need for patient to be in a rehab facility  When can she WB in boot ? For transfers?

## 2019-05-11 NOTE — Telephone Encounter (Signed)
Call received from [place], physical therapist, [facility]; relays he has evaluated patient.  States patient is not appropriate for home therapy, due to non-weight-bearing and severe fall risk. Please call [phone].

## 2019-05-11 NOTE — Patient Outreach (Signed)
Received a Referral from Dr. Stephanie Acre, Patient has Gabriela Rice, I am transferring the referral to [facility] [email or web address] email per workflow procedure.

## 2019-05-12 NOTE — Telephone Encounter (Signed)
6 weeks after injury

## 2019-05-14 ENCOUNTER — Ambulatory Visit (INDEPENDENT_AMBULATORY_CARE_PROVIDER_SITE_OTHER): Payer: PPO | Admitting: Orthopedic Surgery

## 2019-05-14 ENCOUNTER — Other Ambulatory Visit: Payer: Self-pay

## 2019-05-14 DIAGNOSIS — S82841D Displaced bimalleolar fracture of right lower leg, subsequent encounter for closed fracture with routine healing: Secondary | ICD-10-CM

## 2019-05-14 NOTE — Progress Notes (Signed)
Virtual Visit via Telephone Note  I connected with Gabriela Rice on 05/14/19 at 11:30 AM EDT by telephone and verified that I am speaking with the correct person using two identifiers.  Location: Patient: Home  provider: Office   I discussed the limitations, risks, security and privacy concerns of performing an evaluation and management service by telephone and the availability of in person appointments. I also discussed with the patient that there may be a patient responsible charge related to this service. The patient expressed understanding and agreed to proceed.   History of Present Illness: 63 year old female minimal ambulator co nfined to the home has a bimalleolar fracture she is in a cam walker she is getting physical therapy today   Observations/Objective: She says her feet feel better and her ankle looks better probably because she is not been up on it  Assessment and Plan: Bimalleolar fracture right ankle  Follow Up Instructions:   Continue therapy weightbearing through the heel in the boot I discussed the assessment and treatment plan with the patient. The patient was provided an opportunity to ask questions and all were answered. The patient agreed with the plan and demonstrated an understanding of the instructions.   The patient was advised to call back or seek an in-person evaluation if the symptoms worsen or if the condition fails to improve as anticipated.  I provided 4 minutes of non-face-to-face time during this encounter.   Arther Abbott, MD

## 2019-05-15 DIAGNOSIS — S82891A Other fracture of right lower leg, initial encounter for closed fracture: Secondary | ICD-10-CM

## 2019-05-18 ENCOUNTER — Other Ambulatory Visit: Payer: Self-pay | Admitting: Neurology

## 2019-05-18 DIAGNOSIS — Z9181 History of falling: Secondary | ICD-10-CM | POA: Diagnosis not present

## 2019-05-18 DIAGNOSIS — E669 Obesity, unspecified: Secondary | ICD-10-CM | POA: Diagnosis not present

## 2019-05-18 DIAGNOSIS — M5030 Other cervical disc degeneration, unspecified cervical region: Secondary | ICD-10-CM | POA: Diagnosis not present

## 2019-05-18 DIAGNOSIS — Z7982 Long term (current) use of aspirin: Secondary | ICD-10-CM | POA: Diagnosis not present

## 2019-05-18 DIAGNOSIS — G894 Chronic pain syndrome: Secondary | ICD-10-CM | POA: Diagnosis not present

## 2019-05-18 DIAGNOSIS — E785 Hyperlipidemia, unspecified: Secondary | ICD-10-CM | POA: Diagnosis not present

## 2019-05-18 DIAGNOSIS — G35 Multiple sclerosis: Secondary | ICD-10-CM | POA: Diagnosis not present

## 2019-05-18 DIAGNOSIS — E559 Vitamin D deficiency, unspecified: Secondary | ICD-10-CM | POA: Diagnosis not present

## 2019-05-18 DIAGNOSIS — F419 Anxiety disorder, unspecified: Secondary | ICD-10-CM | POA: Diagnosis not present

## 2019-05-18 DIAGNOSIS — Z794 Long term (current) use of insulin: Secondary | ICD-10-CM | POA: Diagnosis not present

## 2019-05-18 DIAGNOSIS — M4726 Other spondylosis with radiculopathy, lumbar region: Secondary | ICD-10-CM | POA: Diagnosis not present

## 2019-05-18 DIAGNOSIS — G43909 Migraine, unspecified, not intractable, without status migrainosus: Secondary | ICD-10-CM | POA: Diagnosis not present

## 2019-05-18 DIAGNOSIS — Z6830 Body mass index (BMI) 30.0-30.9, adult: Secondary | ICD-10-CM | POA: Diagnosis not present

## 2019-05-18 DIAGNOSIS — E119 Type 2 diabetes mellitus without complications: Secondary | ICD-10-CM | POA: Diagnosis not present

## 2019-05-18 DIAGNOSIS — M5136 Other intervertebral disc degeneration, lumbar region: Secondary | ICD-10-CM | POA: Diagnosis not present

## 2019-05-18 DIAGNOSIS — Z86010 Personal history of colonic polyps: Secondary | ICD-10-CM | POA: Diagnosis not present

## 2019-05-18 DIAGNOSIS — I10 Essential (primary) hypertension: Secondary | ICD-10-CM | POA: Diagnosis not present

## 2019-05-18 DIAGNOSIS — S82401D Unspecified fracture of shaft of right fibula, subsequent encounter for closed fracture with routine healing: Secondary | ICD-10-CM | POA: Diagnosis not present

## 2019-05-18 DIAGNOSIS — K5790 Diverticulosis of intestine, part unspecified, without perforation or abscess without bleeding: Secondary | ICD-10-CM | POA: Diagnosis not present

## 2019-05-18 DIAGNOSIS — Z87891 Personal history of nicotine dependence: Secondary | ICD-10-CM | POA: Diagnosis not present

## 2019-05-18 DIAGNOSIS — M47812 Spondylosis without myelopathy or radiculopathy, cervical region: Secondary | ICD-10-CM | POA: Diagnosis not present

## 2019-05-21 ENCOUNTER — Other Ambulatory Visit: Payer: PPO

## 2019-05-24 ENCOUNTER — Other Ambulatory Visit: Payer: Self-pay | Admitting: Internal Medicine

## 2019-06-02 DIAGNOSIS — G894 Chronic pain syndrome: Secondary | ICD-10-CM | POA: Diagnosis not present

## 2019-06-02 DIAGNOSIS — M5136 Other intervertebral disc degeneration, lumbar region: Secondary | ICD-10-CM | POA: Diagnosis not present

## 2019-06-02 DIAGNOSIS — K5790 Diverticulosis of intestine, part unspecified, without perforation or abscess without bleeding: Secondary | ICD-10-CM | POA: Diagnosis not present

## 2019-06-02 DIAGNOSIS — E669 Obesity, unspecified: Secondary | ICD-10-CM | POA: Diagnosis not present

## 2019-06-02 DIAGNOSIS — E785 Hyperlipidemia, unspecified: Secondary | ICD-10-CM | POA: Diagnosis not present

## 2019-06-02 DIAGNOSIS — S82401D Unspecified fracture of shaft of right fibula, subsequent encounter for closed fracture with routine healing: Secondary | ICD-10-CM | POA: Diagnosis not present

## 2019-06-02 DIAGNOSIS — G35 Multiple sclerosis: Secondary | ICD-10-CM | POA: Diagnosis not present

## 2019-06-02 DIAGNOSIS — E119 Type 2 diabetes mellitus without complications: Secondary | ICD-10-CM | POA: Diagnosis not present

## 2019-06-02 DIAGNOSIS — G43909 Migraine, unspecified, not intractable, without status migrainosus: Secondary | ICD-10-CM | POA: Diagnosis not present

## 2019-06-02 DIAGNOSIS — I10 Essential (primary) hypertension: Secondary | ICD-10-CM | POA: Diagnosis not present

## 2019-06-02 DIAGNOSIS — Z794 Long term (current) use of insulin: Secondary | ICD-10-CM | POA: Diagnosis not present

## 2019-06-02 DIAGNOSIS — M47812 Spondylosis without myelopathy or radiculopathy, cervical region: Secondary | ICD-10-CM | POA: Diagnosis not present

## 2019-06-02 DIAGNOSIS — M5030 Other cervical disc degeneration, unspecified cervical region: Secondary | ICD-10-CM | POA: Diagnosis not present

## 2019-06-02 DIAGNOSIS — Z6830 Body mass index (BMI) 30.0-30.9, adult: Secondary | ICD-10-CM | POA: Diagnosis not present

## 2019-06-02 DIAGNOSIS — Z7982 Long term (current) use of aspirin: Secondary | ICD-10-CM | POA: Diagnosis not present

## 2019-06-02 DIAGNOSIS — F419 Anxiety disorder, unspecified: Secondary | ICD-10-CM | POA: Diagnosis not present

## 2019-06-02 DIAGNOSIS — M4726 Other spondylosis with radiculopathy, lumbar region: Secondary | ICD-10-CM | POA: Diagnosis not present

## 2019-06-02 DIAGNOSIS — Z86010 Personal history of colonic polyps: Secondary | ICD-10-CM | POA: Diagnosis not present

## 2019-06-02 DIAGNOSIS — Z87891 Personal history of nicotine dependence: Secondary | ICD-10-CM | POA: Diagnosis not present

## 2019-06-02 DIAGNOSIS — Z9181 History of falling: Secondary | ICD-10-CM | POA: Diagnosis not present

## 2019-06-02 DIAGNOSIS — E559 Vitamin D deficiency, unspecified: Secondary | ICD-10-CM | POA: Diagnosis not present

## 2019-06-14 DIAGNOSIS — L259 Unspecified contact dermatitis, unspecified cause: Secondary | ICD-10-CM | POA: Diagnosis not present

## 2019-06-14 DIAGNOSIS — Z7984 Long term (current) use of oral hypoglycemic drugs: Secondary | ICD-10-CM | POA: Diagnosis not present

## 2019-06-14 DIAGNOSIS — E1169 Type 2 diabetes mellitus with other specified complication: Secondary | ICD-10-CM | POA: Diagnosis not present

## 2019-06-18 ENCOUNTER — Telehealth: Payer: Self-pay | Admitting: Orthopedic Surgery

## 2019-06-18 NOTE — Telephone Encounter (Signed)
Per virtual visit the patient was advised to call back or seek an in-person evaluation if the symptoms worsen or if the condition fails to improve as anticipated.  I called her to see how she is doing / she still is not able to [facility] and can not get in the car to come in for appointment

## 2019-06-18 NOTE — Telephone Encounter (Signed)
Patient called, left voice message today, 06/18/19, 10:37am; said that our office or Dr Aline Brochure was to call her to follow up some time ago; asked if she may receive a call back. Ph# [phone]

## 2019-06-23 DIAGNOSIS — I10 Essential (primary) hypertension: Secondary | ICD-10-CM | POA: Diagnosis not present

## 2019-06-23 DIAGNOSIS — Z86010 Personal history of colonic polyps: Secondary | ICD-10-CM | POA: Diagnosis not present

## 2019-06-23 DIAGNOSIS — E559 Vitamin D deficiency, unspecified: Secondary | ICD-10-CM | POA: Diagnosis not present

## 2019-06-23 DIAGNOSIS — E119 Type 2 diabetes mellitus without complications: Secondary | ICD-10-CM | POA: Diagnosis not present

## 2019-06-23 DIAGNOSIS — Z7982 Long term (current) use of aspirin: Secondary | ICD-10-CM | POA: Diagnosis not present

## 2019-06-23 DIAGNOSIS — G894 Chronic pain syndrome: Secondary | ICD-10-CM | POA: Diagnosis not present

## 2019-06-23 DIAGNOSIS — G35 Multiple sclerosis: Secondary | ICD-10-CM | POA: Diagnosis not present

## 2019-06-23 DIAGNOSIS — M4726 Other spondylosis with radiculopathy, lumbar region: Secondary | ICD-10-CM | POA: Diagnosis not present

## 2019-06-23 DIAGNOSIS — Z794 Long term (current) use of insulin: Secondary | ICD-10-CM | POA: Diagnosis not present

## 2019-06-23 DIAGNOSIS — Z87891 Personal history of nicotine dependence: Secondary | ICD-10-CM | POA: Diagnosis not present

## 2019-06-23 DIAGNOSIS — F419 Anxiety disorder, unspecified: Secondary | ICD-10-CM | POA: Diagnosis not present

## 2019-06-23 DIAGNOSIS — E669 Obesity, unspecified: Secondary | ICD-10-CM | POA: Diagnosis not present

## 2019-06-23 DIAGNOSIS — M5030 Other cervical disc degeneration, unspecified cervical region: Secondary | ICD-10-CM | POA: Diagnosis not present

## 2019-06-23 DIAGNOSIS — G43909 Migraine, unspecified, not intractable, without status migrainosus: Secondary | ICD-10-CM | POA: Diagnosis not present

## 2019-06-23 DIAGNOSIS — E785 Hyperlipidemia, unspecified: Secondary | ICD-10-CM | POA: Diagnosis not present

## 2019-06-23 DIAGNOSIS — S82401D Unspecified fracture of shaft of right fibula, subsequent encounter for closed fracture with routine healing: Secondary | ICD-10-CM | POA: Diagnosis not present

## 2019-06-23 DIAGNOSIS — K5790 Diverticulosis of intestine, part unspecified, without perforation or abscess without bleeding: Secondary | ICD-10-CM | POA: Diagnosis not present

## 2019-06-23 DIAGNOSIS — M47812 Spondylosis without myelopathy or radiculopathy, cervical region: Secondary | ICD-10-CM | POA: Diagnosis not present

## 2019-06-23 DIAGNOSIS — Z9181 History of falling: Secondary | ICD-10-CM | POA: Diagnosis not present

## 2019-06-23 DIAGNOSIS — M5136 Other intervertebral disc degeneration, lumbar region: Secondary | ICD-10-CM | POA: Diagnosis not present

## 2019-06-23 DIAGNOSIS — Z6830 Body mass index (BMI) 30.0-30.9, adult: Secondary | ICD-10-CM | POA: Diagnosis not present

## 2019-06-28 NOTE — Telephone Encounter (Signed)
Do you want her to have another virtual visit? She can not come in, due to her MS and ankle injury she is bed bound.

## 2019-06-28 NOTE — Telephone Encounter (Signed)
Patient called back - states still cannot walk - still staying in bed, due to fracture, and due to her MS\; therefore, said not wearing the brace (as previously noted). Said still doesn't see how she can make it to office for in-person visit. Asking if mobile Xray can be sent to her home and done there.

## 2019-06-28 NOTE — Telephone Encounter (Signed)
No mobile xray can not be sent to the house

## 2019-06-29 ENCOUNTER — Other Ambulatory Visit: Payer: Self-pay

## 2019-06-29 ENCOUNTER — Other Ambulatory Visit: Payer: PPO

## 2019-06-29 DIAGNOSIS — N390 Urinary tract infection, site not specified: Secondary | ICD-10-CM

## 2019-06-29 NOTE — Telephone Encounter (Signed)
She requests another phone call as  Before to speak to Dr Aline Brochure, I have transferred her to Arbie Cookey to schedule, she is aware there is not much he can offer, but would like to schedule.   To you FYI

## 2019-06-29 NOTE — Telephone Encounter (Signed)
I called left message for her to call me back  Will advise can not order mobile xrays, she can have virtual visit if she would like, but likely not able to tell her much without xray. When she feels better she can make appointment.

## 2019-06-30 ENCOUNTER — Encounter: Payer: Self-pay | Admitting: Orthopedic Surgery

## 2019-06-30 ENCOUNTER — Other Ambulatory Visit: Payer: Self-pay

## 2019-06-30 ENCOUNTER — Ambulatory Visit (INDEPENDENT_AMBULATORY_CARE_PROVIDER_SITE_OTHER): Payer: PPO | Admitting: Orthopedic Surgery

## 2019-06-30 DIAGNOSIS — S82841D Displaced bimalleolar fracture of right lower leg, subsequent encounter for closed fracture with routine healing: Secondary | ICD-10-CM

## 2019-06-30 NOTE — Progress Notes (Signed)
Virtual Visit via Telephone Note  I connected with Gabriela Rice on 06/30/19 at  1:30 PM EDT by telephone and verified that I am speaking with the correct person using two identifiers.  Location: Patient: HOME Provider: [facility]   I discussed the limitations, risks, security and privacy concerns of performing an evaluation and management service by telephone and the availability of in person appointments. I also discussed with the patient that there may be a patient responsible charge related to this service. The patient expressed understanding and agreed to proceed.  PRIOR HISTORY     History of Present Illness:  Chief Complaint    Chief Complaint  Patient presents with  . Ankle Pain     HPI Gabriela Rice is a 63 y.o. female.       Patient is a 63 year old female who presents to the emergency department with a complaint of right ankle pain.   The patient states that she has multiple sclerosis.  She got up to go to the bathroom around 330 this morning, fell and injured the right ankle.  She has been having pain and swelling since that time.  The patient denies any other injuries.  The patient denies being on any anticoagulation medications.  She is not had any recent operations or procedures involving the right lower extremity.  The pain is worse when she attempts to put weight on it it does improve slightly when she is sitting and has it elevated.   The history is provided by the patient.  Ankle Pain Associated symptoms: no back pain and no neck pain      Observations/Objective:     Assessment and Plan:  [name] says her ankle feels better but she is not comfortable walking in the boot wants to take it off to see if she can stand up.  She is going to talk to her sons to see if she can get to the car without the boot with their assistance and then make an appointment here in the office on a day when they can help her2   Follow Up Instructions:    I discussed  the assessment and treatment plan with the patient. The patient was provided an opportunity to ask questions and all were answered. The patient agreed with the plan and demonstrated an understanding of the instructions.   The patient was advised to call back or seek an in-person evaluation if the symptoms worsen or if the condition fails to improve as anticipated.  I provided 5 minutes of non-face-to-face time during this encounter.   Arther Abbott, MD

## 2019-07-02 LAB — URINE CULTURE
MICRO NUMBER:: 938504
SPECIMEN QUALITY:: ADEQUATE

## 2019-07-02 LAB — URINALYSIS
Bilirubin Urine: NEGATIVE
Hgb urine dipstick: NEGATIVE
Ketones, ur: NEGATIVE
Nitrite: NEGATIVE
Protein, ur: NEGATIVE
Specific Gravity, Urine: 1.012 (ref 1.001–1.03)
pH: 6 (ref 5.0–8.0)

## 2019-07-05 ENCOUNTER — Telehealth: Payer: Self-pay

## 2019-07-05 NOTE — Telephone Encounter (Signed)
Patient called office today requesting lab results from urine dropped off on 9/29. Patient states she is still not feeling well still, and would like to know what is the reason for this.  Will forward message to MD to review lab results and advise for patient.  Urine Culture: Specimen Information: Urine     Component 6d ago  MICRO NUMBER: [id number]   SPECIMEN QUALITY: Adequate   Sample Source URINE   STATUS: FINAL   ISOLATE 1: Klebsiella pneumoniaeAbnormal    Comment: Greater than 100,000 CFU/mL of Klebsiella pneumoniae  Resulting Agency [facility]  Susceptibility   Klebsiella pneumoniae    URINE CULTURE, REFLEX    AMOX/CLAVULANIC <=2  Sensitive    AMPICILLIN >=32  Resistant    AMPICILLIN/SULBACTAM 4  Sensitive    CEFAZOLIN <=4  Not Reportable1    CEFEPIME <=1  Sensitive    CEFTRIAXONE <=1  Sensitive    CIPROFLOXACIN <=0.25  Sensitive    ERTAPENEM <=0.5  Sensitive    GENTAMICIN <=1  Sensitive    IMIPENEM <=0.25  Sensitive    LEVOFLOXACIN <=0.12  Sensitive    NITROFURANTOIN 64  Intermediate    PIP/TAZO <=4  Sensitive    TOBRAMYCIN <=1  Sensitive    TRIMETH/SULFA <=20  Sensitive2         1 For infections other than uncomplicated UTI  caused by E. coli, K. pneumoniae or P. mirabilis:  Cefazolin is resistant if MIC > or = 8 mcg/mL.  (Distinguishing susceptible versus intermediate  for isolates with MIC < or = 4 mcg/mL requires  additional testing.)  For uncomplicated UTI caused by E. coli,  K. pneumoniae or P. mirabilis: Cefazolin is  susceptible if MIC <32 mcg/mL and predicts  susceptible to the oral agents cefaclor, cefdinir,  cefpodoxime, cefprozil, cefuroxime, cephalexin  and loracarbef.  2 Legend:  S = Susceptible I = Intermediate  R = Resistant NS = Not susceptible  * = Not tested NR = Not reported  **NN = See antimicrobic comments

## 2019-07-06 NOTE — Telephone Encounter (Signed)
Patient called office again regarding results from urine culture. Patient states she is still not feeling well and would like for MD to advise what she should do. Will forward concern to MD to review labs and advise on next steps. [place]

## 2019-07-07 ENCOUNTER — Other Ambulatory Visit: Payer: Self-pay | Admitting: Internal Medicine

## 2019-07-07 DIAGNOSIS — N390 Urinary tract infection, site not specified: Secondary | ICD-10-CM

## 2019-07-07 MED ORDER — AMOXICILLIN-POT CLAVULANATE 875-125 MG PO TABS: 1.0000 | ORAL_TABLET | Freq: Two times a day (BID) | ORAL | 0 refills | Status: DC

## 2019-07-07 NOTE — Progress Notes (Signed)
Has symptoms of dysuria, urine cx with + klebsiella. Will give amox/clav x 5 d which she has tolerated this summer  pcn allergy as a child.

## 2019-07-07 NOTE — Telephone Encounter (Signed)
I called her and started her on abtx

## 2019-07-11 ENCOUNTER — Other Ambulatory Visit: Payer: Self-pay | Admitting: Neurology

## 2019-07-11 DIAGNOSIS — G35 Multiple sclerosis: Secondary | ICD-10-CM

## 2019-07-11 DIAGNOSIS — Z5181 Encounter for therapeutic drug level monitoring: Secondary | ICD-10-CM

## 2019-07-20 ENCOUNTER — Telehealth: Payer: Self-pay | Admitting: Neurology

## 2019-07-20 ENCOUNTER — Telehealth: Payer: Self-pay

## 2019-07-20 DIAGNOSIS — Z5181 Encounter for therapeutic drug level monitoring: Secondary | ICD-10-CM

## 2019-07-20 DIAGNOSIS — G35 Multiple sclerosis: Secondary | ICD-10-CM

## 2019-07-20 NOTE — Addendum Note (Signed)
Addended by: Kathrynn Ducking on: 07/20/2019 05:27 PM   Modules accepted: Orders

## 2019-07-20 NOTE — Telephone Encounter (Signed)
I will make the home health referral, the problem is that I have not seen the patient in the office since 16 March 2019, insurance may not approve the home health therapy for this reason.

## 2019-07-20 NOTE — Telephone Encounter (Signed)
Lovena Le RN with [facility] spoke with me today in regards to the pt's Solumedrol infusion. Lovena Le states she called Ms. Garcilazo today to schedule her 3 day course of solumedrol and was advised by the pt she would prefer to have treatment completed in her home. Pt reported to [facility] difficulty in walking and would be easier to have infusion set up in the home. I reviewed the pt's chart and back on 03/01/2019 we placed a referral for [facility] to provide home infusion.

## 2019-07-20 NOTE — Telephone Encounter (Signed)
[place] drug registry has been verified. Last refill was 04/05/2019 # 270 for a 90 day supply. Pt has been compliant with f/u's.

## 2019-07-22 NOTE — Telephone Encounter (Signed)
Feels better since completing abtx for uti. No longer has dysuria.

## 2019-07-26 ENCOUNTER — Telehealth: Payer: Self-pay | Admitting: Neurology

## 2019-07-26 NOTE — Telephone Encounter (Signed)
Can you please fax Home Health order , last note and insurance card to Tim at [facility].  On cover sheet put patient in epic. Telephone [phone] - fax [phone] . Thanks Hinton Dyer.

## 2019-07-26 NOTE — Telephone Encounter (Signed)
Faxed [facility] to [facility].

## 2019-07-27 ENCOUNTER — Telehealth: Payer: Self-pay | Admitting: Neurology

## 2019-07-27 DIAGNOSIS — G35 Multiple sclerosis: Secondary | ICD-10-CM

## 2019-07-27 NOTE — Telephone Encounter (Signed)
Laverta [place], [place]. So sorry for the delay. I have been working to get options for Mrs. Denherder. I am so sorry, but we are not able to provide her home Solumedrol. I believe we were able to provide for her once before, but we are not able to secure [facility] due to lack of many agencies being unable to accept [employer] plan. Please keep [place] in mind for your next patient as we will always work hard in efforts to support you and your patients. Thank you Hinton Dyer.   Pam~  [facility]      [facility] relayed they cant take Patient either because of staffing.  [facility] declined her as well.  [facility] can't do it .  Im going to send a message to Wilburn Cornelia [phone], Kerry Dory at [facility] [phone] and Lorel Monaco at [facility] [phone].

## 2019-07-27 NOTE — Telephone Encounter (Signed)
[facility] Does not have the staffing right now. Will try [facility].

## 2019-07-28 NOTE — Telephone Encounter (Signed)
I called the patient.  The patient indicates that [facility] has been out to her house previously, she was last seen through them in June.  May try [facility].

## 2019-07-28 NOTE — Telephone Encounter (Signed)
I called the patient.  [facility] would not go out for Solu-Medrol injections.  The patient however is in need of physical therapy for leg strengthening and ambulation.  It is possible that if I order physical therapy and order nursing for IV Solu-Medrol together, this could be done.  I will reorder.

## 2019-07-28 NOTE — Addendum Note (Signed)
Addended by: Kathrynn Ducking on: 07/28/2019 12:01 PM   Modules accepted: Orders

## 2019-08-02 NOTE — Telephone Encounter (Signed)
Noted  

## 2019-08-02 NOTE — Telephone Encounter (Signed)
[facility] has also declined referral.

## 2019-08-02 NOTE — Telephone Encounter (Signed)
Dewayne called me back form Patient's insurance . I gave him all 7 agencies that had turned me down he is going to contact some of them and call me back. Home Heath referral.  Patient is aware of status.

## 2019-08-02 NOTE — Telephone Encounter (Signed)
I went back  spoke to infusion and they  And they relayed that  They can take the patient and do her Solumedrol here . Patient refused  And wants it done at home . Patient is going to get her insurance agent to call me . [employer] 5 agencies have declined already . Taking her insurance I have talked to patient and she is aware of what's going on.

## 2019-08-05 NOTE — Telephone Encounter (Signed)
Patient's insurance company called and they are working to see what they can .

## 2019-08-09 ENCOUNTER — Other Ambulatory Visit: Payer: Self-pay | Admitting: Neurology

## 2019-08-09 MED ORDER — PREDNISONE 10 MG PO TABS: ORAL_TABLET | ORAL | 0 refills | Status: DC

## 2019-08-10 NOTE — Telephone Encounter (Signed)
Please advise her of the option to come in to our office.

## 2019-08-10 NOTE — Telephone Encounter (Signed)
I called and checked on patient and her insurance is still working on this.  Im doing everything I can for this patient I have reached out to so many agencies and no one has the staff or  She is not covered buy insurance . Patient is in network with [facility] and they don't have staff.

## 2019-08-10 NOTE — Telephone Encounter (Signed)
I have called [facility] they don't provide IV Solu-Medrol . I have talked to Patient again today she is very understanding .

## 2019-08-10 NOTE — Telephone Encounter (Signed)
I have called the patient . Patient states she can't get here patient has broken her right ankle and its very hard for her to get around . Patient states from her injury her MS is worse because she has not been able to move around.   Dr. Jannifer Franklin called her in Prednisone today . Patient will call back to the office with update.

## 2019-08-11 NOTE — Telephone Encounter (Signed)
Unable to get home health agency out of the house, will make patient aware of this.

## 2019-08-11 NOTE — Telephone Encounter (Signed)
Gabriela Rice. has made the patient aware.

## 2019-08-13 ENCOUNTER — Other Ambulatory Visit: Payer: Self-pay | Admitting: Neurology

## 2019-08-19 ENCOUNTER — Other Ambulatory Visit: Payer: Self-pay

## 2019-08-19 MED ORDER — BACLOFEN 10 MG PO TABS: ORAL_TABLET | ORAL | 1 refills | Status: DC

## 2019-08-25 NOTE — Telephone Encounter (Signed)
See Epic telephone Notes. Gabriela Rice

## 2019-09-09 DIAGNOSIS — I10 Essential (primary) hypertension: Secondary | ICD-10-CM | POA: Diagnosis not present

## 2019-09-09 DIAGNOSIS — E782 Mixed hyperlipidemia: Secondary | ICD-10-CM | POA: Diagnosis not present

## 2019-09-09 DIAGNOSIS — E1169 Type 2 diabetes mellitus with other specified complication: Secondary | ICD-10-CM | POA: Diagnosis not present

## 2019-09-09 DIAGNOSIS — Z23 Encounter for immunization: Secondary | ICD-10-CM | POA: Diagnosis not present

## 2019-09-09 DIAGNOSIS — Z1624 Resistance to multiple antibiotics: Secondary | ICD-10-CM | POA: Diagnosis not present

## 2019-09-09 DIAGNOSIS — E559 Vitamin D deficiency, unspecified: Secondary | ICD-10-CM | POA: Diagnosis not present

## 2019-09-09 DIAGNOSIS — F330 Major depressive disorder, recurrent, mild: Secondary | ICD-10-CM | POA: Diagnosis not present

## 2019-09-09 DIAGNOSIS — Z7984 Long term (current) use of oral hypoglycemic drugs: Secondary | ICD-10-CM | POA: Diagnosis not present

## 2019-09-09 DIAGNOSIS — Z79899 Other long term (current) drug therapy: Secondary | ICD-10-CM | POA: Diagnosis not present

## 2019-09-09 DIAGNOSIS — Z0000 Encounter for general adult medical examination without abnormal findings: Secondary | ICD-10-CM | POA: Diagnosis not present

## 2019-09-15 ENCOUNTER — Ambulatory Visit: Payer: PPO | Admitting: Neurology

## 2019-09-15 NOTE — Progress Notes (Deleted)
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  She mainly uses her wheelchair for mobility.  She is taking Copaxone, and she is also taking Imuran for her autoimmune hepatitis. She suffered an exacerbation of her functional abilities associated with a recent bladder infection in June 2020.  The spasticity in her lower extremities have worsened.  She had a urine culture done, that was positive for E. coli, was treated with nitrofurantoin.She suffered an ankle fracture in August.  She was to be set up for IV steroids, but this was not done due to insurance purposes.  She was treated with oral steroids. 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  The patient at baseline has a chronic gait disorder with very limited capacity to ambulate.  On a good day, she can walk with a walker up to 100 feet, on some days she is unable to ambulate at all.  She mainly uses her wheelchair for mobility.  She has noted an increase in spasticity in the lower extremities, right greater than left that has occurred recently, she was treated for a bladder infection and currently is off therapy for this, but she does take cephalexin on a daily basis for preventative therapy for bladder infection.  She has not noted any change in function of the arms or any vision changes.  Her last MRI of the brain and cervical spine was done 3 years ago.  The patient does have urinary incontinence.  She reports no falls recently.  She may have spasticity of the legs at nighttime, but usually the worst time of day is in late afternoon.  She is on baclofen  taking 10 mg 3 times daily.  She takes Copaxone as a disease modifying agent for her multiple sclerosis but she is also on Imuran for her autoimmune hepatitis.  She received a 3-day course of Solu-Medrol recently, she felt bad about 5 days after the treatment but has started to normalize with her leg strength more recently.   REVIEW OF SYSTEMS: Out of a complete 14 system review of symptoms, the patient complains only of the following symptoms, and all other reviewed systems are negative.  ALLERGIES: Allergies  Allergen Reactions  . Penicillins Other (See Comments)    seizure as child Has patient had a PCN reaction causing immediate rash, facial/tongue/throat swelling, SOB or lightheadedness with hypotension: No Has patient had a PCN reaction causing severe rash involving mucus membranes or skin necrosis:No Has patient had a PCN reaction that required hospitalizationNo Has patient had a PCN reaction occurring within the last 10 years:No If all of the above answers are "NO", then may proceed with Cephalosporin use.    . Codeine Swelling    hands swell    HOME MEDICATIONS: Outpatient Medications Prior to Visit  Medication Sig Dispense Refill  . ALPRAZolam (XANAX) 1 MG tablet TAKE 1 TABLET BY MOUTH AT BEDTIME AS NEEDED 90 tablet 1  . amoxicillin-clavulanate (AUGMENTIN) 875-125 MG tablet Take 1 tablet by mouth 2 (two) times daily. 10 tablet 0  . aspirin EC 81 MG tablet Take 81 mg by mouth daily.    Marland Kitchen azaTHIOprine (IMURAN) 50  MG tablet TAKE 3 TABLETS BY MOUTH EVERY DAY AT THE SAME TIME;  Patient needs office visit for further refills 270 tablet 1  . baclofen (LIORESAL) 10 MG tablet One tablet in the morning and evening and 1.5 tablets at midday 315 tablet 1  . cetirizine (ZYRTEC) 10 MG tablet Take 10 mg by mouth daily.    . Cholecalciferol (VITAMIN D) 2000 UNITS CAPS Take 2,000 Units by mouth daily.     Marland Kitchen COPAXONE 40 MG/ML SOSY INJECT 40 MG (1 ML) UNDER THE SKIN THREE TIMES A WEEK. 12 mL 3    . DULoxetine (CYMBALTA) 30 MG capsule Take 1 capsule by mouth daily.    Marland Kitchen gabapentin (NEURONTIN) 300 MG capsule TAKE 1 CAPSULE BY MOUTH THREE TIMES A DAY 270 capsule 2  . HUMALOG KWIKPEN 100 UNIT/ML KwikPen 2-10 Units 3 (three) times daily. Sliding scale    . ibuprofen (ADVIL,MOTRIN) 200 MG tablet Take 200 mg by mouth every 6 (six) hours as needed for pain.    Marland Kitchen losartan (COZAAR) 100 MG tablet Take 100 mg by mouth daily.     . metFORMIN (GLUCOPHAGE) 500 MG tablet Take 500 mg by mouth 2 (two) times daily with a meal.     . nitrofurantoin, macrocrystal-monohydrate, (MACROBID) 100 MG capsule Take 1 capsule (100 mg total) by mouth 2 (two) times daily. (Patient not taking: Reported on 05/04/2019) 12 capsule 0  . polyethylene glycol powder (GLYCOLAX/MIRALAX) powder Take one scoop in 8 oz of water daily 527 g 3  . polyethylene glycol powder (GLYCOLAX/MIRALAX) powder take 17GM (DISSOLVED IN WATER) by mouth once daily 765 g 3  . polyethylene glycol powder (GLYCOLAX/MIRALAX) powder TAKE 17GM(DISSOLVED IN WATER) BY MOUTH ONCE DAILY 510 g 0  . pravastatin (PRAVACHOL) 40 MG tablet Take 40 mg by mouth daily.     . predniSONE (DELTASONE) 10 MG tablet Begin taking 6 tablets daily, taper by one tablet every other day until off the medication. 42 tablet 0  . traMADol (ULTRAM) 50 MG tablet TAKE 1 TABLET BY MOUTH THREE TIMES DAILY AS NEEDED 270 tablet 0   No facility-administered medications prior to visit.    PAST MEDICAL HISTORY: Past Medical History:  Diagnosis Date  . Abnormality of gait 02/16/2013  . Allergic rhinitis   . Allergy   . Anxiety   . Autoimmune hepatitis ([place])   . Autoimmune hepatitis ([place])   . Colon polyps   . Diverticulosis   . DM (diabetes mellitus) ([place])   . Elevated LFTs   . Hemorrhoids   . Hemorrhoids   . Hepatitis    with cholestasis  . Hyperlipidemia   . Hypertension   . Migraines   . Multiple sclerosis ([place])   . Obesity   . Vitamin D deficiency     PAST SURGICAL  HISTORY: Past Surgical History:  Procedure Laterality Date  . ABDOMINAL HYSTERECTOMY    . CESAREAN SECTION     x 2  . MYOECTOMY    . TONSILLECTOMY AND ADENOIDECTOMY      FAMILY HISTORY: Family History  Problem Relation Age of Onset  . Diabetes Mother   . Multiple sclerosis Mother   . Cirrhosis Father   . Heart disease Maternal Grandmother   . Colon cancer Maternal Aunt   . Uterine cancer Maternal Aunt   . Brain cancer Maternal Uncle   . Colon cancer Maternal Uncle   . Breast cancer Neg Hx     SOCIAL HISTORY: Social History   Socioeconomic History  .  Marital status: Married    Spouse name: Not on file  . Number of children: 2  . Years of education: HS  . Highest education level: Not on file  Occupational History  . Occupation: Unemployed  Tobacco Use  . Smoking status: Former Smoker    Packs/day: 0.25    Years: 4.00    Pack years: 1.00    Types: Cigarettes    Quit date: 10/01/1995    Years since quitting: 23.9  . Smokeless tobacco: Never Used  Substance and Sexual Activity  . Alcohol use: No    Alcohol/week: 0.0 standard drinks  . Drug use: No  . Sexual activity: Not on file  Other Topics Concern  . Not on file  Social History Narrative   Lives at home w/ her husband   Right-handed   Drinks 1 cup of coffee per day   Social Determinants of Health   Financial Resource Strain:   . Difficulty of Paying Living Expenses: Not on file  Food Insecurity:   . Worried About Charity fundraiser in the Last Year: Not on file  . Ran Out of Food in the Last Year: Not on file  Transportation Needs:   . Lack of Transportation (Medical): Not on file  . Lack of Transportation (Non-Medical): Not on file  Physical Activity:   . Days of Exercise per Week: Not on file  . Minutes of Exercise per Session: Not on file  Stress:   . Feeling of Stress : Not on file  Social Connections:   . Frequency of Communication with Friends and Family: Not on file  . Frequency of Social  Gatherings with Friends and Family: Not on file  . Attends Religious Services: Not on file  . Active Member of Clubs or Organizations: Not on file  . Attends Archivist Meetings: Not on file  . Marital Status: Not on file  Intimate Partner Violence:   . Fear of Current or Ex-Partner: Not on file  . Emotionally Abused: Not on file  . Physically Abused: Not on file  . Sexually Abused: Not on file      PHYSICAL EXAM  There were no vitals filed for this visit. There is no height or weight on file to calculate BMI.  Generalized: Well developed, in no acute distress   Neurological examination  Mentation: Alert oriented to time, place, history taking. Follows all commands speech and language fluent Cranial nerve II-XII: Pupils were equal round reactive to light. Extraocular movements were full, visual field were full on confrontational test. Facial sensation and strength were normal. Uvula tongue midline. Head turning and shoulder shrug  were normal and symmetric. Motor: The motor testing reveals 5 over 5 strength of all 4 extremities. Good symmetric motor tone is noted throughout.  Sensory: Sensory testing is intact to soft touch on all 4 extremities. No evidence of extinction is noted.  Coordination: Cerebellar testing reveals good finger-nose-finger and heel-to-shin bilaterally.  Gait and station: Gait is normal. Tandem gait is normal. Romberg is negative. No drift is seen.  Reflexes: Deep tendon reflexes are symmetric and normal bilaterally.   DIAGNOSTIC DATA (LABS, IMAGING, TESTING) - I reviewed patient records, labs, notes, testing and imaging myself where available.  Lab Results  Component Value Date   WBC 8.2 03/16/2019   HGB 12.3 03/16/2019   HCT 35.1 03/16/2019   MCV 92 03/16/2019   PLT 225 03/16/2019      Component Value Date/Time   NA 137 03/16/2019  1133   NA 142 09/16/2014 1420   K 3.9 03/16/2019 1133   K 3.9 09/16/2014 1420   CL 98 03/16/2019 1133   CL  105 09/16/2014 1420   CO2 25 03/16/2019 1133   CO2 29 09/16/2014 1420   GLUCOSE 191 (H) 03/16/2019 1133   GLUCOSE 131 (H) 03/25/2018 1410   GLUCOSE 111 (H) 09/16/2014 1420   BUN 25 03/16/2019 1133   BUN 21 (H) 09/16/2014 1420   CREATININE 0.80 03/16/2019 1133   CREATININE 0.86 09/16/2014 1420   CALCIUM 9.5 03/16/2019 1133   CALCIUM 9.2 09/16/2014 1420   PROT 6.2 03/16/2019 1133   PROT 7.7 09/16/2014 1420   ALBUMIN 4.3 03/16/2019 1133   ALBUMIN 4.0 09/16/2014 1420   AST 18 03/16/2019 1133   AST 41 (H) 09/16/2014 1420   ALT 30 03/16/2019 1133   ALT 42 09/16/2014 1420   ALKPHOS 45 03/16/2019 1133   ALKPHOS 69 09/16/2014 1420   BILITOT 0.5 03/16/2019 1133   BILITOT 0.6 09/16/2014 1420   GFRNONAA 79 03/16/2019 1133   GFRNONAA >60 09/16/2014 1420   GFRAA 91 03/16/2019 1133   GFRAA >60 09/16/2014 1420   No results found for: CHOL, HDL, LDLCALC, LDLDIRECT, TRIG, CHOLHDL Lab Results  Component Value Date   HGBA1C 6.1 (H) 08/22/2015   No results found for: VITAMINB12 Lab Results  Component Value Date   TSH 0.266 (L) 08/22/2015      ASSESSMENT AND PLAN 63 y.o. year old female  has a past medical history of Abnormality of gait (02/16/2013), Allergic rhinitis, Allergy, Anxiety, Autoimmune hepatitis ([place]), Autoimmune hepatitis ([place]), Colon polyps, Diverticulosis, DM (diabetes mellitus) ([place]), Elevated LFTs, Hemorrhoids, Hemorrhoids, Hepatitis, Hyperlipidemia, Hypertension, Migraines, Multiple sclerosis ([place]), Obesity, and Vitamin D deficiency. here with ***   I spent 15 minutes with the patient. 50% of this time was spent   Butler Denmark, [place], DNP 09/15/2019, 5:59 AM [facility] [address], [place], [place] [address] [phone]

## 2019-09-20 ENCOUNTER — Telehealth: Payer: Self-pay | Admitting: Neurology

## 2019-09-20 NOTE — Telephone Encounter (Signed)
I have received blood work results that were done on 2019-09-09.  Hemoglobin A1c was 7.0  White blood count 5.0, hemoglobin 13.7, hematocrit 40.3, MCV of 96.1, platelets of 288.  Glucose of 158, BUN of 18, creatinine 0.57, estimated GFR is 129. Sodium of 139, potassium 4.1, chloride of 102, CO2 26, calcium 10.1, albumin of 4.5, total protein 6.7, total bili 2.7, ALP of 55, AST of 49 which is slightly elevated, ALT of 36. TSH is 1.79.  Vitamin D level 51.9.  Total cholesterol 202 with a LDL level of 131.

## 2019-09-22 ENCOUNTER — Inpatient Hospital Stay (HOSPITAL_COMMUNITY)
Admission: EM | Admit: 2019-09-22 | Discharge: 2019-09-29 | DRG: 062 | Disposition: A | Discharge status: Home Health | Payer: PPO | Attending: Neurology | Admitting: Neurology

## 2019-09-22 ENCOUNTER — Emergency Department (HOSPITAL_COMMUNITY): Payer: PPO

## 2019-09-22 ENCOUNTER — Other Ambulatory Visit: Payer: Self-pay

## 2019-09-22 ENCOUNTER — Telehealth: Payer: Self-pay | Admitting: Neurology

## 2019-09-22 ENCOUNTER — Encounter (HOSPITAL_COMMUNITY): Payer: Self-pay | Admitting: Emergency Medicine

## 2019-09-22 DIAGNOSIS — Z820 Family history of epilepsy and other diseases of the nervous system: Secondary | ICD-10-CM

## 2019-09-22 DIAGNOSIS — Z808 Family history of malignant neoplasm of other organs or systems: Secondary | ICD-10-CM

## 2019-09-22 DIAGNOSIS — E119 Type 2 diabetes mellitus without complications: Secondary | ICD-10-CM | POA: Diagnosis not present

## 2019-09-22 DIAGNOSIS — I6389 Other cerebral infarction: Secondary | ICD-10-CM | POA: Diagnosis not present

## 2019-09-22 DIAGNOSIS — I959 Hypotension, unspecified: Secondary | ICD-10-CM | POA: Diagnosis not present

## 2019-09-22 DIAGNOSIS — R29708 NIHSS score 8: Secondary | ICD-10-CM | POA: Diagnosis not present

## 2019-09-22 DIAGNOSIS — Z20828 Contact with and (suspected) exposure to other viral communicable diseases: Secondary | ICD-10-CM | POA: Diagnosis present

## 2019-09-22 DIAGNOSIS — M79662 Pain in left lower leg: Secondary | ICD-10-CM | POA: Diagnosis not present

## 2019-09-22 DIAGNOSIS — E785 Hyperlipidemia, unspecified: Secondary | ICD-10-CM | POA: Diagnosis present

## 2019-09-22 DIAGNOSIS — N390 Urinary tract infection, site not specified: Secondary | ICD-10-CM | POA: Diagnosis not present

## 2019-09-22 DIAGNOSIS — R29810 Facial weakness: Secondary | ICD-10-CM | POA: Diagnosis not present

## 2019-09-22 DIAGNOSIS — G35 Multiple sclerosis: Secondary | ICD-10-CM | POA: Diagnosis present

## 2019-09-22 DIAGNOSIS — Z6835 Body mass index (BMI) 35.0-35.9, adult: Secondary | ICD-10-CM

## 2019-09-22 DIAGNOSIS — E7800 Pure hypercholesterolemia, unspecified: Secondary | ICD-10-CM | POA: Diagnosis not present

## 2019-09-22 DIAGNOSIS — F419 Anxiety disorder, unspecified: Secondary | ICD-10-CM | POA: Diagnosis not present

## 2019-09-22 DIAGNOSIS — E782 Mixed hyperlipidemia: Secondary | ICD-10-CM | POA: Diagnosis present

## 2019-09-22 DIAGNOSIS — R29818 Other symptoms and signs involving the nervous system: Secondary | ICD-10-CM | POA: Diagnosis not present

## 2019-09-22 DIAGNOSIS — R339 Retention of urine, unspecified: Secondary | ICD-10-CM | POA: Diagnosis not present

## 2019-09-22 DIAGNOSIS — K754 Autoimmune hepatitis: Secondary | ICD-10-CM | POA: Diagnosis not present

## 2019-09-22 DIAGNOSIS — Z833 Family history of diabetes mellitus: Secondary | ICD-10-CM | POA: Diagnosis not present

## 2019-09-22 DIAGNOSIS — W19XXXA Unspecified fall, initial encounter: Secondary | ICD-10-CM | POA: Diagnosis present

## 2019-09-22 DIAGNOSIS — S82831D Other fracture of upper and lower end of right fibula, subsequent encounter for closed fracture with routine healing: Secondary | ICD-10-CM | POA: Diagnosis not present

## 2019-09-22 DIAGNOSIS — Z7401 Bed confinement status: Secondary | ICD-10-CM | POA: Diagnosis not present

## 2019-09-22 DIAGNOSIS — G459 Transient cerebral ischemic attack, unspecified: Secondary | ICD-10-CM | POA: Diagnosis not present

## 2019-09-22 DIAGNOSIS — Q211 Atrial septal defect: Secondary | ICD-10-CM | POA: Diagnosis not present

## 2019-09-22 DIAGNOSIS — S82891S Other fracture of right lower leg, sequela: Secondary | ICD-10-CM | POA: Diagnosis not present

## 2019-09-22 DIAGNOSIS — R2243 Localized swelling, mass and lump, lower limb, bilateral: Secondary | ICD-10-CM | POA: Diagnosis not present

## 2019-09-22 DIAGNOSIS — S82831A Other fracture of upper and lower end of right fibula, initial encounter for closed fracture: Secondary | ICD-10-CM | POA: Diagnosis not present

## 2019-09-22 DIAGNOSIS — M2550 Pain in unspecified joint: Secondary | ICD-10-CM | POA: Diagnosis not present

## 2019-09-22 DIAGNOSIS — I63311 Cerebral infarction due to thrombosis of right middle cerebral artery: Secondary | ICD-10-CM | POA: Diagnosis not present

## 2019-09-22 DIAGNOSIS — E876 Hypokalemia: Secondary | ICD-10-CM

## 2019-09-22 DIAGNOSIS — Z7982 Long term (current) use of aspirin: Secondary | ICD-10-CM

## 2019-09-22 DIAGNOSIS — M79661 Pain in right lower leg: Secondary | ICD-10-CM | POA: Diagnosis not present

## 2019-09-22 DIAGNOSIS — Z7984 Long term (current) use of oral hypoglycemic drugs: Secondary | ICD-10-CM

## 2019-09-22 DIAGNOSIS — R4701 Aphasia: Secondary | ICD-10-CM | POA: Diagnosis not present

## 2019-09-22 DIAGNOSIS — I10 Essential (primary) hypertension: Secondary | ICD-10-CM | POA: Diagnosis not present

## 2019-09-22 DIAGNOSIS — Z87891 Personal history of nicotine dependence: Secondary | ICD-10-CM | POA: Diagnosis not present

## 2019-09-22 DIAGNOSIS — R270 Ataxia, unspecified: Secondary | ICD-10-CM | POA: Diagnosis not present

## 2019-09-22 DIAGNOSIS — S82891A Other fracture of right lower leg, initial encounter for closed fracture: Secondary | ICD-10-CM

## 2019-09-22 DIAGNOSIS — Z8049 Family history of malignant neoplasm of other genital organs: Secondary | ICD-10-CM

## 2019-09-22 DIAGNOSIS — E669 Obesity, unspecified: Secondary | ICD-10-CM | POA: Diagnosis present

## 2019-09-22 DIAGNOSIS — R0689 Other abnormalities of breathing: Secondary | ICD-10-CM | POA: Diagnosis not present

## 2019-09-22 DIAGNOSIS — R338 Other retention of urine: Secondary | ICD-10-CM | POA: Diagnosis not present

## 2019-09-22 DIAGNOSIS — I82443 Acute embolism and thrombosis of tibial vein, bilateral: Secondary | ICD-10-CM | POA: Diagnosis present

## 2019-09-22 DIAGNOSIS — Z993 Dependence on wheelchair: Secondary | ICD-10-CM | POA: Diagnosis not present

## 2019-09-22 DIAGNOSIS — Z800 Family history of malignant neoplasm of digestive organs: Secondary | ICD-10-CM

## 2019-09-22 DIAGNOSIS — Z8249 Family history of ischemic heart disease and other diseases of the circulatory system: Secondary | ICD-10-CM

## 2019-09-22 DIAGNOSIS — R0902 Hypoxemia: Secondary | ICD-10-CM | POA: Diagnosis not present

## 2019-09-22 DIAGNOSIS — R531 Weakness: Secondary | ICD-10-CM | POA: Diagnosis not present

## 2019-09-22 DIAGNOSIS — Z79899 Other long term (current) drug therapy: Secondary | ICD-10-CM

## 2019-09-22 DIAGNOSIS — G8190 Hemiplegia, unspecified affecting unspecified side: Secondary | ICD-10-CM | POA: Diagnosis not present

## 2019-09-22 DIAGNOSIS — I63411 Cerebral infarction due to embolism of right middle cerebral artery: Secondary | ICD-10-CM | POA: Diagnosis not present

## 2019-09-22 DIAGNOSIS — I639 Cerebral infarction, unspecified: Secondary | ICD-10-CM | POA: Diagnosis present

## 2019-09-22 DIAGNOSIS — E1159 Type 2 diabetes mellitus with other circulatory complications: Secondary | ICD-10-CM | POA: Diagnosis not present

## 2019-09-22 DIAGNOSIS — G43909 Migraine, unspecified, not intractable, without status migrainosus: Secondary | ICD-10-CM | POA: Diagnosis present

## 2019-09-22 LAB — DIFFERENTIAL
Abs Immature Granulocytes: 0.01 10*3/uL (ref 0.00–0.07)
Basophils Absolute: 0 10*3/uL (ref 0.0–0.1)
Basophils Relative: 1 %
Eosinophils Absolute: 0.2 10*3/uL (ref 0.0–0.5)
Eosinophils Relative: 4 %
Immature Granulocytes: 0 %
Lymphocytes Relative: 20 %
Lymphs Abs: 1.2 10*3/uL (ref 0.7–4.0)
Monocytes Absolute: 0.6 10*3/uL (ref 0.1–1.0)
Monocytes Relative: 10 %
Neutro Abs: 3.7 10*3/uL (ref 1.7–7.7)
Neutrophils Relative %: 65 %

## 2019-09-22 LAB — RESPIRATORY PANEL BY RT PCR (FLU A&B, COVID)
Influenza A by PCR: NEGATIVE
Influenza B by PCR: NEGATIVE
SARS Coronavirus 2 by RT PCR: NEGATIVE

## 2019-09-22 LAB — COMPREHENSIVE METABOLIC PANEL
ALT: 27 U/L (ref 0–44)
AST: 29 U/L (ref 15–41)
Albumin: 4.2 g/dL (ref 3.5–5.0)
Alkaline Phosphatase: 58 U/L (ref 38–126)
Anion gap: 11 (ref 5–15)
BUN: 18 mg/dL (ref 8–23)
CO2: 28 mmol/L (ref 22–32)
Calcium: 9.6 mg/dL (ref 8.9–10.3)
Chloride: 100 mmol/L (ref 98–111)
Creatinine, Ser: 0.69 mg/dL (ref 0.44–1.00)
GFR calc Af Amer: >60 mL/min (ref >60)
GFR calc non Af Amer: >60 mL/min (ref >60)
Glucose, Bld: 145 mg/dL — ABNORMAL HIGH (ref 70–99)
Potassium: 3.8 mmol/L (ref 3.5–5.1)
Sodium: 139 mmol/L (ref 135–145)
Total Bilirubin: 0.6 mg/dL (ref 0.3–1.2)
Total Protein: 7.3 g/dL (ref 6.5–8.1)

## 2019-09-22 LAB — CBC
HCT: 39.1 % (ref 36.0–46.0)
Hemoglobin: 13.3 g/dL (ref 12.0–15.0)
MCH: 33.3 pg (ref 26.0–34.0)
MCHC: 34 g/dL (ref 30.0–36.0)
MCV: 97.8 fL (ref 80.0–100.0)
Platelets: 275 10*3/uL (ref 150–400)
RBC: 4 MIL/uL (ref 3.87–5.11)
RDW: 14.2 % (ref 11.5–15.5)
WBC: 5.7 10*3/uL (ref 4.0–10.5)
nRBC: 0 % (ref 0.0–0.2)

## 2019-09-22 LAB — CBG MONITORING, ED: Glucose-Capillary: 145 mg/dL — ABNORMAL HIGH (ref 70–99)

## 2019-09-22 LAB — PROTIME-INR
INR: 0.9 (ref 0.8–1.2)
Prothrombin Time: 12.1 seconds (ref 11.4–15.2)

## 2019-09-22 LAB — ETHANOL: Alcohol, Ethyl (B): <10 mg/dL (ref <10)

## 2019-09-22 LAB — APTT: aPTT: 31 seconds (ref 24–36)

## 2019-09-22 MED ORDER — IOHEXOL 350 MG/ML SOLN
75.0000 mL | Freq: Once | INTRAVENOUS | Status: AC | PRN
  Administered 2019-09-22: 75 mL via INTRAVENOUS

## 2019-09-22 MED ORDER — ALTEPLASE 100 MG IV SOLR
INTRAVENOUS | Status: AC
  Administered 2019-09-22: 84.3 mg via INTRAVENOUS
  Filled 2019-09-22: qty 100

## 2019-09-22 MED ORDER — SODIUM CHLORIDE 0.9 % IV SOLN
50.0000 mL | Freq: Once | INTRAVENOUS | Status: AC
  Administered 2019-09-22: 50 mL via INTRAVENOUS

## 2019-09-22 MED ORDER — ALTEPLASE (STROKE) FULL DOSE INFUSION
0.9000 mg/kg | Freq: Once | INTRAVENOUS | Status: AC
  Filled 2019-09-22: qty 100

## 2019-09-22 NOTE — ED Provider Notes (Signed)
[facility] EMERGENCY DEPARTMENT Provider Note   CSN: [id number] Arrival date & time: 09/22/19  1901     History Chief Complaint  Patient presents with  . Neurologic Problem    Gabriela Rice is a 63 y.o. female.  Patient is a 63 year old female with extensive past medical history including multiple sclerosis, hypertension, diabetes, hyperlipidemia, hepatitis.  She presents today for evaluation of left arm weakness, slurred speech, and facial numbness.  This started acutely at approximately 1700 hrs. this afternoon.  The facial numbness and slurred speech have resolved, however she continues to experience weakness and numbness in her left arm.  She denies any headache or visual disturbances.  Patient called her neurologist who informed her she should call 911 and come to the ER to be evaluated.  The history is provided by the patient.  Neurologic Problem This is a new problem. Episode onset: 5 PM. The problem occurs constantly. The problem has been gradually improving. Pertinent negatives include no chest pain, no headaches and no shortness of breath. Nothing aggravates the symptoms. Nothing relieves the symptoms. She has tried nothing for the symptoms.       Past Medical History:  Diagnosis Date  . Abnormality of gait 02/16/2013  . Allergic rhinitis   . Allergy   . Anxiety   . Autoimmune hepatitis ([place])   . Autoimmune hepatitis ([place])   . Colon polyps   . Diverticulosis   . DM (diabetes mellitus) ([place])   . Elevated LFTs   . Hemorrhoids   . Hemorrhoids   . Hepatitis    with cholestasis  . Hyperlipidemia   . Hypertension   . Migraines   . Multiple sclerosis ([place])   . Obesity   . Vitamin D deficiency     Patient Active Problem List   Diagnosis Date Noted  . Benign paroxysmal positional vertigo 09/06/2015  . Constipation   . Weakness of both legs   . Essential hypertension   . Type 2 diabetes mellitus with diabetic neuropathy ([place])   . Morbid obesity ([place])   .  Generalized anxiety disorder   . Lumbar spondylosis with myelopathy   . Dehydration   . Chronic UTI   . Multiple sclerosis exacerbation ([place]) 08/20/2015  . Abdominal pain 08/20/2015  . Autoimmune hepatitis ([place]) 08/20/2015  . Diabetes mellitus type 2, controlled ([place]) 08/20/2015  . Hypertension 08/20/2015  . Right-sided Lumbar Facet Syndrome 07/13/2015  . Lumbar spondylosis 07/13/2015  . Chronic low back pain 07/13/2015  . Trigger point with back pain 07/13/2015  . Myofascial pain syndrome 07/13/2015  . Chronic pain 07/13/2015  . Chronic pain syndrome 07/13/2015  . Generalized weakness 07/13/2015  . Right-sided chronic neck pain 07/13/2015  . Cervical spondylosis 07/13/2015  . DDD (degenerative disc disease), cervical 07/13/2015  . Cervical facet syndrome 07/13/2015  . DDD (degenerative disc disease), lumbar 07/13/2015  . Pain of right lower extremity 07/13/2015  . Chronic radicular lumbar pain 07/13/2015  . Non-insulin dependent type 2 diabetes mellitus ([place]) 07/13/2015  . History of hysterectomy 07/13/2015  . History of C-section 07/13/2015  . DM (diabetes mellitus) ([place]) 07/13/2015  . Hyperlipidemia 07/13/2015  . Obesity 07/13/2015  . Diverticulosis 07/13/2015  . Lumbago 02/02/2014  . Difficulty in walking(719.7) 02/02/2014  . HBP (high blood pressure) 07/11/2013  . Chronic rhinitis 04/08/2013  . Abnormality of gait 02/16/2013  . Elevated liver function tests 02/12/2013  . Abdominal pain, right upper quadrant 02/12/2013  . Nausea alone 02/12/2013  . Dyspnea 02/04/2013  .  Hypoxemia, nocturnal only  02/04/2013  . Encounter for long-term (current) use of other medications 10/14/2012  . Multiple sclerosis ([place]) 10/14/2012  . IRRITABLE BOWEL SYNDROME 03/29/2009  . FATTY LIVER DISEASE 03/29/2009  . Nonspecific elevation of levels of transaminase or lactic acid dehydrogenase (LDH) 03/29/2009  . PERSONAL HX COLONIC POLYPS 03/29/2009    Past Surgical History:  Procedure  Laterality Date  . ABDOMINAL HYSTERECTOMY    . CESAREAN SECTION     x 2  . MYOECTOMY    . TONSILLECTOMY AND ADENOIDECTOMY       OB History   No obstetric history on file.     Family History  Problem Relation Age of Onset  . Diabetes Mother   . Multiple sclerosis Mother   . Cirrhosis Father   . Heart disease Maternal Grandmother   . Colon cancer Maternal Aunt   . Uterine cancer Maternal Aunt   . Brain cancer Maternal Uncle   . Colon cancer Maternal Uncle   . Breast cancer Neg Hx     Social History   Tobacco Use  . Smoking status: Former Smoker    Packs/day: 0.25    Years: 4.00    Pack years: 1.00    Types: Cigarettes    Quit date: 10/01/1995    Years since quitting: 23.9  . Smokeless tobacco: Never Used  Substance Use Topics  . Alcohol use: No    Alcohol/week: 0.0 standard drinks  . Drug use: No    Home Medications Prior to Admission medications   Medication Sig Start Date End Date Taking? Authorizing Provider  ALPRAZolam Duanne Moron) 1 MG tablet TAKE 1 TABLET BY MOUTH AT BEDTIME AS NEEDED 04/07/19   Kathrynn Ducking, MD  amoxicillin-clavulanate (AUGMENTIN) 875-125 MG tablet Take 1 tablet by mouth 2 (two) times daily. 07/07/19   Carlyle Basques, MD  aspirin EC 81 MG tablet Take 81 mg by mouth daily.    [provider]  azaTHIOprine (IMURAN) 50 MG tablet TAKE 3 TABLETS BY MOUTH EVERY DAY AT THE SAME TIME;  Patient needs office visit for further refills 05/24/19   Irene Shipper, MD  baclofen (LIORESAL) 10 MG tablet One tablet in the morning and evening and 1.5 tablets at midday 08/19/19   Kathrynn Ducking, MD  cetirizine (ZYRTEC) 10 MG tablet Take 10 mg by mouth daily.    [provider]  Cholecalciferol (VITAMIN D) 2000 UNITS CAPS Take 2,000 Units by mouth daily.     [provider]  COPAXONE 40 MG/ML SOSY INJECT 40 MG (1 ML) UNDER THE SKIN THREE TIMES A WEEK. 05/18/19   Kathrynn Ducking, MD  DULoxetine (CYMBALTA) 30 MG capsule Take 1 capsule by  mouth daily. 05/03/19   [provider]  gabapentin (NEURONTIN) 300 MG capsule TAKE 1 CAPSULE BY MOUTH THREE TIMES A DAY 04/15/19   Kathrynn Ducking, MD  HUMALOG KWIKPEN 100 UNIT/ML KwikPen 2-10 Units 3 (three) times daily. Sliding scale 03/05/19   [provider]  ibuprofen (ADVIL,MOTRIN) 200 MG tablet Take 200 mg by mouth every 6 (six) hours as needed for pain.    [provider]  losartan (COZAAR) 100 MG tablet Take 100 mg by mouth daily.  09/02/13   [provider]  metFORMIN (GLUCOPHAGE) 500 MG tablet Take 500 mg by mouth 2 (two) times daily with a meal.  03/15/13   [provider]  nitrofurantoin, macrocrystal-monohydrate, (MACROBID) 100 MG capsule Take 1 capsule (100 mg total) by mouth 2 (two)  times daily. Patient not taking: Reported on 05/04/2019 04/15/19   Kathrynn Ducking, MD  polyethylene glycol powder [facility]) powder Take one scoop in 8 oz of water daily 09/07/15   Love, Ivan Anchors, PA-C  polyethylene glycol powder (GLYCOLAX/MIRALAX) powder take 17GM (DISSOLVED IN WATER) by mouth once daily 05/26/17   Irene Shipper, MD  polyethylene glycol powder (GLYCOLAX/MIRALAX) powder TAKE 17GM(DISSOLVED IN WATER) BY MOUTH ONCE DAILY 05/11/18   Irene Shipper, MD  pravastatin (PRAVACHOL) 40 MG tablet Take 40 mg by mouth daily.  07/04/14   [provider]  predniSONE (DELTASONE) 10 MG tablet Begin taking 6 tablets daily, taper by one tablet every other day until off the medication. 08/09/19   Kathrynn Ducking, MD  traMADol (ULTRAM) 50 MG tablet TAKE 1 TABLET BY MOUTH THREE TIMES DAILY AS NEEDED 07/20/19   Kathrynn Ducking, MD    Allergies    Penicillins and Codeine  Review of Systems   Review of Systems  Respiratory: Negative for shortness of breath.   Cardiovascular: Negative for chest pain.  Neurological: Negative for headaches.  All other systems reviewed and are negative.   Physical Exam Updated Vital Signs BP (!) 143/106 (BP Location:  Right Arm)   Pulse 92   Temp 98 F (36.7 C) (Oral)   Resp 15   Wt 92.5 kg   SpO2 94%   BMI 35.02 kg/m   Physical Exam Vitals and nursing note reviewed.  Constitutional:      General: She is not in acute distress.    Appearance: She is well-developed. She is not diaphoretic.  HENT:     Head: Normocephalic and atraumatic.  Eyes:     Extraocular Movements: Extraocular movements intact.     Pupils: Pupils are equal, round, and reactive to light.  Cardiovascular:     Rate and Rhythm: Normal rate and regular rhythm.     Heart sounds: No murmur. No friction rub. No gallop.   Pulmonary:     Effort: Pulmonary effort is normal. No respiratory distress.     Breath sounds: Normal breath sounds. No wheezing.  Abdominal:     General: Bowel sounds are normal. There is no distension.     Palpations: Abdomen is soft.     Tenderness: There is no abdominal tenderness.  Musculoskeletal:        General: Normal range of motion.     Cervical back: Normal range of motion and neck supple.  Skin:    General: Skin is warm and dry.  Neurological:     Mental Status: She is alert and oriented to person, place, and time.     Cranial Nerves: No cranial nerve deficit.     Comments: Strength is 4 out of 5 in the left upper extremity and 5 out of 5 in the right upper extremity.  Patient has significant weakness throughout both lower extremities, however this is her baseline related to MS.     ED Results / Procedures / Treatments   Labs (all labs ordered are listed, but only abnormal results are displayed) Labs Reviewed - No data to display  EKG EKG Interpretation  Date/Time:  Wednesday September 22 2019 19:13:17 EST Ventricular Rate:  94 PR Interval:    QRS Duration: 96 QT Interval:  340 QTC Calculation: 426 R Axis:   16 Text Interpretation: Sinus rhythm RSR' in V1 or V2, right VCD or RVH Nonspecific T abnormalities, lateral leads Confirmed by Veryl Speak [phone]) on 09/22/2019 7:29:07  PM   Radiology No results found.  Procedures Procedures (including critical care time)  Medications Ordered in ED Medications - No data to display  ED Course  I have reviewed the triage vital signs and the nursing notes.  Pertinent labs & imaging results that were available during my care of the patient were reviewed by me and considered in my medical decision making (see chart for details).    MDM Rules/Calculators/A&P  Patient arrives here with complaints of left arm weakness, facial numbness, and slurred speech that began approximately 2 hours prior to presentation.  For this reason, a code stroke was initiated and patient evaluated by teleneurology.  Her initial head CT is negative.  Teleneurology feels as though patient is a good candidate for TPA.  This was initiated without much change in her neuro exam.  Laboratory studies essentially unremarkable.  Care discussed with Dr. Cheral Marker from [facility] neurology.  He is recommending a CT angio of the head and neck to rule out large vessel occlusion.  This was performed and was normal.  Patient will be transferred to [facility] for admission and further stroke work-up.  CRITICAL CARE Performed by: Veryl Speak Total critical care time: 45 minutes Critical care time was exclusive of separately billable procedures and treating other patients. Critical care was necessary to treat or prevent imminent or life-threatening deterioration. Critical care was time spent personally by me on the following activities: development of treatment plan with patient and/or surrogate as well as nursing, discussions with consultants, evaluation of patient's response to treatment, examination of patient, obtaining history from patient or surrogate, ordering and performing treatments and interventions, ordering and review of laboratory studies, ordering and review of radiographic studies, pulse oximetry and re-evaluation of patient's condition.   Final Clinical  Impression(s) / ED Diagnoses Final diagnoses:  None    Rx / DC Orders ED Discharge Orders    None       Veryl Speak, MD 09/22/19 2247

## 2019-09-22 NOTE — Telephone Encounter (Signed)
I called the patient.  The patient this evening had sudden onset of slurred speech and left arm numbness and clumsiness, the pain is weak.  This came on over seconds.  The patient does have a history of multiple sclerosis but the above event is most consistent with a stroke episode.  The speech has improved but the hand problem has not.  I have asked the patient to call 911, go to the emergency room immediately.

## 2019-09-22 NOTE — ED Triage Notes (Signed)
Pt from home via Ems. Pt was last seen normal 1655. Husband states that pt had left sided facial droop and left arm weakness. All symptoms resolved at this time. Pt with hx of MS.

## 2019-09-22 NOTE — ED Notes (Signed)
8.4 mg bolus started from bag.

## 2019-09-22 NOTE — Consult Note (Signed)
[facility] Tele[facility]urology Consult Services   Date of Service:   09/22/2019 19:40:01  Impression:     .  I63.311 - Cerebrovascular accident (CVA) due to thrombosis of right middle cerebral artery ([place])  Comments/Sign-Out: Patient with history of Autoimmune hepatitis, Multiple Sclerosis, Hypertension, Hyperlipidemia, Diabetes, at baseline cannot walk. Presents with left arm weakness. Head CT: No acute Intracranial hemorrhage. NIHSS 8 (6 points are chronic deficits due to Multiple Sclerosis) Etiology for Symptoms/presentation is consistent with a small right hemispheric Acute Ischemic Stroke. risks and benefits of IV Alteplase discussed with patient and/or family. After obtaining answers to contraindication questions and verbal consent IV Alteplase was administered. Symptoms not suggestive of Large Vessel Occlusion. Will be admitted for stroke workup.  Metrics: Last Known Well: 09/22/2019 16:55:00 [facility] Notification Time: 09/22/2019 19:40:01 Arrival Time: 09/22/2019 19:01:00 Stamp Time: 09/22/2019 19:40:01 Time First Login Attempt: 09/22/2019 19:47:04 Video Start Time: 09/22/2019 19:47:04  Symptoms: left sided weakness NIHSS Start Assessment Time: 09/22/2019 19:50:00 Alteplase Early Mix Decision Time: 09/22/2019 19:57:00 Patient is a candidate for Alteplase/Activase. Alteplase/Activase CPOE Order Time: 09/22/2019 20:00:52 Needle Time: 09/22/2019 20:11:00 Weight Noted by Staff: 93.67 kg Video End Time: 09/22/2019 20:10:58 Reason for Alteplase/Activase Delay: Activation Delay  CT head was reviewed.  Clinical Presentation is not Suggestive of Large Vessel Occlusive Disease  ED Physician notified of diagnostic impression and management plan on 09/22/2019 20:15:43  Alteplase/Activase Contraindications:  Last Known Well > 4.5 hours: No CT Head showing hemorrhage: No Ischemic stroke within 3 months: No Severe head trauma within 3 months:  No Intracranial/intraspinal surgery within 3 months: No History of intracranial hemorrhage: No Symptoms and signs consistent with an SAH: No GI malignancy or GI bleed within 21 days: No Coagulopathy: Platelets <100 000/mm3, INR >1.7, aPTT>40 s, or PT >15 s: No Treatment dose of LMWH within the previous 24 hrs: No Use of NOACs in past 48 hours: No Glycoprotein IIb/IIIa receptor inhibitors use: No Symptoms consistent with infective endocarditis: No Suspected aortic arch dissection: No Intra-axial intracranial neoplasm: No  Verbal Consent to Alteplase/Activase: I have explained to the Patient the nature of the patient's condition, reviewed the indications and contraindications to the use of Alteplase/Activase fibrinolytic agent, reviewed the indications and contraindications and the benefits to be reasonably expected compared with alternative approaches. I have discussed the likelihood of major risks or complications of this procedure including (if applicable) but not limited to loss of limb function, brain damage, paralysis, hemorrhage, infection, complications from transfusion of blood components, drug reactions, blood clots and loss of life. I have also indicated that with any procedure there is always the possibility of an unexpected complication. All questions were answered and Patient express understanding of the treatment plan and consent to the treatment.  Our recommendations are outlined below.  Recommendations: IV Alteplase/Activase recommended.  Alteplase/Activase bolus given Without Complication.   IV Alteplase/Activase Total Dose - 84.3 mg IV Alteplase/Activase Bolus Dose - 8.4 mg IV Alteplase/Activase Infusion Dose - 75.9 mg  Routine post Alteplase/Activase monitoring including neuro checks and blood pressure control during/after treatment Monitor blood pressure Check blood pressure and NIHSS every 15 min for 2 h, then every 30 min for 6 h, and finally every hour for 16  h.  Manage Blood Pressure per post Alteplase/Activase protocol.      .  Admission to ICU     .  CT brain 24 hours post Alteplase/Activase     .  NPO until swallowing screen performed and passed     .  No antiplatelet agents or anticoagulants (including heparin for DVT prophylaxis) in first 24 hours     .  No Foley catheter, nasogastric tube, arterial catheter or central venous catheter for 24 hr, unless absolutely necessary     .  Telemetry     .  Bedside swallow evaluation     .  HOB less than 30 degrees     .  Euglycemia     .  Avoid hyperthermia, PRN acetaminophen     .  DVT prophylaxis     .  Inpatient Neurology Consultation     .  Stroke evaluation as per inpatient neurology recommendations  Discussed with ED physician    ------------------------------------------------------------------------------  History of Present Illness: Patient is a 63 year old Female.  Patient was brought by EMS for symptoms of left sided weakness  Consult for Stroke Evaluation Patient with history of Autoimmune hepatitis, Multiple Sclerosis, Hypertension, Hyperlipidemia, Diabetes, at baseline cannot walk. last known well per patient and Presenting symptoms/Chief complaint/reason for consult: 16:55 Developed sudden onset left arm weakness and numbness, slurred speech and left facial droop. Facial droop and slurred speech have resolved. Anticoagulation or Antiplatelet use: no Chart reviewed for Pertinent medical, surgical, family history. Chart reviewed for Medications list and allergies. Pertinent positive and negative review of systems noted in History of Present Illness.  Last seen normal was within 4.5 hours. There is no history of hemorrhagic complications or intracranial hemorrhage. There is no history of Recent Anticoagulants. There is no history of recent major surgery. There is no history of recent stroke.  Past Medical History:     . Hypertension     . Diabetes Mellitus     .  Hyperlipidemia  Anticoagulant use:  No  Antiplatelet use: No   Examination: BP(159/72), Pulse(104), Blood Glucose(145) 1A: Level of Consciousness - Alert; keenly responsive + 0 1B: Ask Month and Age - Both Questions Right + 0 1C: Blink Eyes & Squeeze Hands - Performs Both Tasks + 0 2: Test Horizontal Extraocular Movements - Normal + 0 3: Test Visual Fields - No Visual Loss + 0 4: Test Facial Palsy (Use Grimace if Obtunded) - Normal symmetry + 0 5A: Test Left Arm Motor Drift - Drift, but doesn't hit bed + 1 5B: Test Right Arm Motor Drift - No Drift for 10 Seconds + 0 6A: Test Left Leg Motor Drift - No Effort Against Gravity + 3 6B: Test Right Leg Motor Drift - No Effort Against Gravity + 3 7: Test Limb Ataxia (FNF/Heel-Shin) - Ataxia in 1 Limb + 1 8: Test Sensation - Normal; No sensory loss + 0 9: Test Language/Aphasia - Normal; No aphasia + 0 10: Test Dysarthria - Normal + 0 11: Test Extinction/Inattention - No abnormality + 0  NIHSS Score: 8  Pre-Morbid Modified Ranking Scale: 4 Points = Moderately severe disability; unable to walk and attend to bodily needs without assistance   Patient/Family was informed the Neurology Consult would happen via TeleHealth consult by way of interactive audio and video telecommunications and consented to receiving care in this manner.   Due to the immediate potential for life-threatening deterioration due to underlying acute neurologic illness, I spent 23 minutes providing critical care. This time includes time for face to face visit via telemedicine, review of medical records, imaging studies and discussion of findings with providers, the patient and/or family.   Dr Elenor Quinones   [facility] [phone]   Case [id number]

## 2019-09-22 NOTE — Progress Notes (Signed)
CODE STROKE TIME DOCUMENTATION NO PHONE [place] EXAM FINISHED 1931 IMAGES SENT TO [place] 1934 EXAM COMPLETED IN Epic [phone] [place]

## 2019-09-22 NOTE — ED Notes (Signed)
CODE STROKE 

## 2019-09-23 ENCOUNTER — Inpatient Hospital Stay (HOSPITAL_COMMUNITY): Payer: PPO

## 2019-09-23 DIAGNOSIS — I6389 Other cerebral infarction: Secondary | ICD-10-CM

## 2019-09-23 DIAGNOSIS — I639 Cerebral infarction, unspecified: Secondary | ICD-10-CM | POA: Insufficient documentation

## 2019-09-23 LAB — URINALYSIS, ROUTINE W REFLEX MICROSCOPIC
Bilirubin Urine: NEGATIVE
Glucose, UA: 150 mg/dL — AB
Ketones, ur: 20 mg/dL — AB
Nitrite: NEGATIVE
Protein, ur: 100 mg/dL — AB
RBC / HPF: >50 RBC/hpf — ABNORMAL HIGH (ref 0–5)
Specific Gravity, Urine: 1.019 (ref 1.005–1.030)
WBC, UA: >50 WBC/hpf — ABNORMAL HIGH (ref 0–5)
pH: 5 (ref 5.0–8.0)

## 2019-09-23 LAB — CBC
HCT: 36.8 % (ref 36.0–46.0)
Hemoglobin: 13 g/dL (ref 12.0–15.0)
MCH: 32.7 pg (ref 26.0–34.0)
MCHC: 35.3 g/dL (ref 30.0–36.0)
MCV: 92.7 fL (ref 80.0–100.0)
Platelets: 276 10*3/uL (ref 150–400)
RBC: 3.97 MIL/uL (ref 3.87–5.11)
RDW: 14 % (ref 11.5–15.5)
WBC: 7.5 10*3/uL (ref 4.0–10.5)
nRBC: 0 % (ref 0.0–0.2)

## 2019-09-23 LAB — HEMOGLOBIN A1C
Hgb A1c MFr Bld: 6.6 % — ABNORMAL HIGH (ref 4.8–5.6)
Mean Plasma Glucose: 142.72 mg/dL

## 2019-09-23 LAB — ECHOCARDIOGRAM COMPLETE: Weight: 3304 oz

## 2019-09-23 LAB — CBG MONITORING, ED: Glucose-Capillary: 157 mg/dL — ABNORMAL HIGH (ref 70–99)

## 2019-09-23 LAB — HIV ANTIBODY (ROUTINE TESTING W REFLEX): HIV Screen 4th Generation wRfx: NONREACTIVE

## 2019-09-23 LAB — GLUCOSE, CAPILLARY: Glucose-Capillary: 152 mg/dL — ABNORMAL HIGH (ref 70–99)

## 2019-09-23 MED ORDER — FENTANYL CITRATE (PF) 100 MCG/2ML IJ SOLN
50.0000 ug | Freq: Once | INTRAMUSCULAR | Status: AC
  Administered 2019-09-23: 50 ug via INTRAVENOUS
  Filled 2019-09-23: qty 2

## 2019-09-23 MED ORDER — BACLOFEN 10 MG PO TABS
15.0000 mg | ORAL_TABLET | Freq: Every day | ORAL | Status: DC
  Administered 2019-09-23 – 2019-09-28 (×6): 15 mg via ORAL
  Filled 2019-09-23 (×5): qty 2
  Filled 2019-09-23: qty 1
  Filled 2019-09-23: qty 2

## 2019-09-23 MED ORDER — GABAPENTIN 300 MG PO CAPS
300.0000 mg | ORAL_CAPSULE | Freq: Three times a day (TID) | ORAL | Status: DC
  Administered 2019-09-23 – 2019-09-29 (×18): 300 mg via ORAL
  Filled 2019-09-23 (×18): qty 1

## 2019-09-23 MED ORDER — ACETAMINOPHEN 160 MG/5ML PO SOLN: 650.0000 mg | ORAL | Status: DC | PRN

## 2019-09-23 MED ORDER — ONDANSETRON HCL 4 MG/2ML IJ SOLN: 4.0000 mg | Freq: Four times a day (QID) | INTRAMUSCULAR | Status: DC | PRN

## 2019-09-23 MED ORDER — DULOXETINE HCL 60 MG PO CPEP
90.0000 mg | ORAL_CAPSULE | Freq: Every day | ORAL | Status: DC
  Administered 2019-09-24 – 2019-09-29 (×6): 90 mg via ORAL
  Filled 2019-09-23 (×6): qty 1

## 2019-09-23 MED ORDER — GLATIRAMER ACETATE 40 MG/ML ~~LOC~~ SOSY
40.0000 mg | PREFILLED_SYRINGE | SUBCUTANEOUS | Status: DC
  Administered 2019-09-24: 40 mg via SUBCUTANEOUS
  Administered 2019-09-27: 40 mg/mL via SUBCUTANEOUS
  Administered 2019-09-29: 40 mg via SUBCUTANEOUS
  Filled 2019-09-23: qty 1

## 2019-09-23 MED ORDER — LABETALOL HCL 5 MG/ML IV SOLN: 20.0000 mg | Freq: Once | INTRAVENOUS | Status: DC

## 2019-09-23 MED ORDER — TRAMADOL HCL 50 MG PO TABS
50.0000 mg | ORAL_TABLET | Freq: Once | ORAL | Status: AC
  Administered 2019-09-23: 50 mg via ORAL
  Filled 2019-09-23: qty 1

## 2019-09-23 MED ORDER — PANTOPRAZOLE SODIUM 40 MG IV SOLR: 40.0000 mg | Freq: Every day | INTRAVENOUS | Status: DC

## 2019-09-23 MED ORDER — BACLOFEN 10 MG PO TABS
10.0000 mg | ORAL_TABLET | Freq: Every day | ORAL | Status: DC
  Administered 2019-09-23 – 2019-09-29 (×7): 10 mg via ORAL
  Filled 2019-09-23 (×7): qty 1

## 2019-09-23 MED ORDER — LABETALOL HCL 5 MG/ML IV SOLN: 10.0000 mg | Freq: Once | INTRAVENOUS | Status: DC | PRN

## 2019-09-23 MED ORDER — SODIUM CHLORIDE 0.9 % IV SOLN: 50.0000 mL/h | INTRAVENOUS | Status: DC

## 2019-09-23 MED ORDER — NICARDIPINE HCL IN NACL 20-0.86 MG/200ML-% IV SOLN: 0.0000 mg/h | INTRAVENOUS | Status: DC | PRN

## 2019-09-23 MED ORDER — ACETAMINOPHEN 325 MG PO TABS
650.0000 mg | ORAL_TABLET | ORAL | Status: DC | PRN
  Administered 2019-09-24 – 2019-09-29 (×8): 650 mg via ORAL
  Filled 2019-09-23 (×8): qty 2

## 2019-09-23 MED ORDER — INSULIN ASPART 100 UNIT/ML ~~LOC~~ SOLN
0.0000 [IU] | SUBCUTANEOUS | Status: DC
  Administered 2019-09-23 – 2019-09-24 (×2): 3 [IU] via SUBCUTANEOUS

## 2019-09-23 MED ORDER — TRAMADOL HCL 50 MG PO TABS
50.0000 mg | ORAL_TABLET | Freq: Three times a day (TID) | ORAL | Status: DC | PRN
  Administered 2019-09-23 – 2019-09-29 (×14): 50 mg via ORAL
  Filled 2019-09-23 (×15): qty 1

## 2019-09-23 MED ORDER — ACETAMINOPHEN 650 MG RE SUPP: 650.0000 mg | RECTAL | Status: DC | PRN

## 2019-09-23 MED ORDER — LORATADINE 10 MG PO TABS
10.0000 mg | ORAL_TABLET | Freq: Every day | ORAL | Status: DC
  Administered 2019-09-23 – 2019-09-29 (×7): 10 mg via ORAL
  Filled 2019-09-23 (×7): qty 1

## 2019-09-23 MED ORDER — STROKE: EARLY STAGES OF RECOVERY BOOK
Freq: Once | Status: AC
  Filled 2019-09-23: qty 1

## 2019-09-23 MED ORDER — AZATHIOPRINE 50 MG PO TABS
150.0000 mg | ORAL_TABLET | Freq: Every day | ORAL | Status: DC
  Administered 2019-09-24 – 2019-09-29 (×6): 150 mg via ORAL
  Filled 2019-09-23 (×6): qty 3

## 2019-09-23 MED ORDER — SODIUM CHLORIDE 0.9 % IV SOLN: INTRAVENOUS | Status: DC

## 2019-09-23 MED ORDER — CLEVIDIPINE BUTYRATE 0.5 MG/ML IV EMUL: 0.0000 mg/h | INTRAVENOUS | Status: DC

## 2019-09-23 MED ORDER — SENNOSIDES-DOCUSATE SODIUM 8.6-50 MG PO TABS
1.0000 | ORAL_TABLET | Freq: Every evening | ORAL | Status: DC | PRN
  Administered 2019-09-24 – 2019-09-28 (×2): 1 via ORAL
  Filled 2019-09-23 (×2): qty 1

## 2019-09-23 MED ORDER — PRAVASTATIN SODIUM 40 MG PO TABS
40.0000 mg | ORAL_TABLET | Freq: Every day | ORAL | Status: DC
  Administered 2019-09-23 – 2019-09-24 (×2): 40 mg via ORAL
  Filled 2019-09-23 (×2): qty 1

## 2019-09-23 MED ORDER — LORAZEPAM 2 MG/ML IJ SOLN
0.5000 mg | Freq: Once | INTRAMUSCULAR | Status: AC | PRN
  Administered 2019-09-23: 0.5 mg via INTRAVENOUS
  Filled 2019-09-23: qty 1

## 2019-09-23 MED ORDER — MIDAZOLAM HCL 2 MG/2ML IJ SOLN: 2.0000 mg | Freq: Once | INTRAMUSCULAR | Status: DC

## 2019-09-23 NOTE — ED Notes (Signed)
Pt arrives via EMS as transfer from [facility]. Neuro PA states pt requires monitoring s/p TPA and needs MRI to r/o CVA

## 2019-09-23 NOTE — H&P (Addendum)
Neurology history and physical   History is obtained from: Patient  HPI: Gabriela Rice is a 63 y.o. female with debilitating MS, diabetes, autoimmune hepatitis, hypertension, hyperlipidemia, anxiety, wheelchair-bound secondary to [place] and obesity.  Patient states that yesterday her husband was helping her get dressed and move the bed down from the waist from a 90 degree to 0 degrees.  At that time she noted that she could not speak to him and her left arm was limp.  After he brought her back to 90 degrees she was able to speak to him but her arm only improved slightly.  At that time she was brought to any pen and seen by teleneurology.  Code stroke was called.  Stat head CT was obtained which showed no hemorrhage.  Findings of chronic small vessel disease without acute hypodensity was noted.  Teleneurology consulted and found an NIH stroke scale of 8.  Overall risks/benefits were felt to favor treatment with TPA.  Infusion was started at 8:11  Patient was then [facility] to [facility].  Currently patient scores and I show scale of 5.  She tells me that she has significant help at home by her husband secondary to the fact that she cannot move her legs due to MS.  Currently her only symptoms that she feels is clumsiness with her left hand and weakness with her left arm.   ED course patient was seen in any pain.  CT scan was obtained along with CTA head and neck.  Patient was seen by teleneurology and administered TPA   Chart review-patient was followed closely by Dr. Jannifer Franklin of [facility] neurology for multiple sclerosis.  Work up that has been done: -CT head -CTA head and neck -Labs  LKW: 09/22/2019 at 1655 tpa given?:  Yes Premorbid modified Rankin scale (mRS): 5 NIH stroke score: 10   Past Medical History:  Diagnosis Date  . Abnormality of gait 02/16/2013  . Allergic rhinitis   . Allergy   . Anxiety   . Autoimmune hepatitis ([place])   . Autoimmune hepatitis ([place])   . Colon  polyps   . Diverticulosis   . DM (diabetes mellitus) ([place])   . Elevated LFTs   . Hemorrhoids   . Hemorrhoids   . Hepatitis    with cholestasis  . Hyperlipidemia   . Hypertension   . Migraines   . Multiple sclerosis ([place])   . Obesity   . Vitamin D deficiency     Family History  Problem Relation Age of Onset  . Diabetes Mother   . Multiple sclerosis Mother   . Cirrhosis Father   . Heart disease Maternal Grandmother   . Colon cancer Maternal Aunt   . Uterine cancer Maternal Aunt   . Brain cancer Maternal Uncle   . Colon cancer Maternal Uncle   . Breast cancer Neg Hx     Social History:   reports that she quit smoking about 23 years ago. Her smoking use included cigarettes. She has a 1.00 pack-year smoking history. She has never used smokeless tobacco. She reports that she does not drink alcohol or use drugs.  Medications  Current Facility-Administered Medications:  .   stroke: mapping our early stages of recovery book, , Does not apply, Once, Greta Doom, MD .  0.9 %  sodium chloride infusion, 50 mL/hr, Intravenous, Continuous, Alara Daniel, Vida Roller, MD .  acetaminophen (TYLENOL) tablet 650 mg, 650 mg, Oral, Q4H PRN **OR** acetaminophen (TYLENOL) 160 MG/5ML solution 650 mg, 650  mg, Per Tube, Q4H PRN **OR** acetaminophen (TYLENOL) suppository 650 mg, 650 mg, Rectal, Q4H PRN, Greta Doom, MD .  azaTHIOprine (IMURAN) tablet 150 mg, 150 mg, Oral, Daily, Leonel Ramsay, Vida Roller, MD .  baclofen (LIORESAL) tablet 10-15 mg, 10-15 mg, Oral, See admin instructions, Greta Doom, MD .  DULoxetine (CYMBALTA) DR capsule 30 mg, 30 mg, Oral, Daily, Greta Doom, MD .  gabapentin (NEURONTIN) capsule 300 mg, 300 mg, Oral, TID, Greta Doom, MD .  Derrill Memo ON 09/24/2019] Glatiramer Acetate SOSY 40 mg, 40 mg, Subcutaneous, Q M,W,F, Myshawn [place], Vida Roller, MD .  labetalol (NORMODYNE) injection 10 mg, 10 mg, Intravenous, Once PRN **AND**  nicardipine (CARDENE) 20mg  in 0.86% saline 272ml IV infusion (0.1 mg/ml), 0-15 mg/hr, Intravenous, Continuous PRN, Greta Doom, MD .  loratadine (CLARITIN) tablet 10 mg, 10 mg, Oral, Daily, Greta Doom, MD .  pantoprazole (PROTONIX) injection 40 mg, 40 mg, Intravenous, QHS, Penina Reisner, Vida Roller, MD .  pravastatin (PRAVACHOL) tablet 40 mg, 40 mg, Oral, Daily, Greta Doom, MD .  traMADol Veatrice Bourbon) tablet 50 mg, 50 mg, Oral, TID PRN, Greta Doom, MD  Current Outpatient Medications:  .  ALPRAZolam (XANAX) 1 MG tablet, TAKE 1 TABLET BY MOUTH AT BEDTIME AS NEEDED (Patient taking differently: Take 1 mg by mouth at bedtime as needed for anxiety or sleep. ), Disp: 90 tablet, Rfl: 1 .  azaTHIOprine (IMURAN) 50 MG tablet, TAKE 3 TABLETS BY MOUTH EVERY DAY AT THE SAME TIME;  Patient needs office visit for further refills (Patient taking differently: Take 150 mg by mouth daily. TAKE 3 TABLETS BY MOUTH EVERY DAY AT THE SAME TIME), Disp: 270 tablet, Rfl: 1 .  baclofen (LIORESAL) 10 MG tablet, One tablet in the morning and evening and 1.5 tablets at midday (Patient taking differently: Take 10-15 mg by mouth See admin instructions. Take one tablet in the morning and evening and 1.5 tablets at midday), Disp: 315 tablet, Rfl: 1 .  COPAXONE 40 MG/ML SOSY, INJECT 40 MG (1 ML) UNDER THE SKIN THREE TIMES A WEEK. (Patient taking differently: Inject 40 mg into the skin every Monday, Wednesday, and Friday. ), Disp: 12 mL, Rfl: 3 .  DULoxetine (CYMBALTA) 30 MG capsule, Take 30 mg by mouth daily. , Disp: , Rfl:  .  gabapentin (NEURONTIN) 300 MG capsule, TAKE 1 CAPSULE BY MOUTH THREE TIMES A DAY (Patient taking differently: Take 300 mg by mouth 3 (three) times daily. ), Disp: 270 capsule, Rfl: 2 .  HUMALOG KWIKPEN 100 UNIT/ML KwikPen, 2-10 Units 3 (three) times daily. Sliding scale, Disp: , Rfl:  .  losartan (COZAAR) 100 MG tablet, Take 100 mg by mouth daily. , Disp: , Rfl:  .   metFORMIN (GLUCOPHAGE) 500 MG tablet, Take 500 mg by mouth 2 (two) times daily with a meal. , Disp: , Rfl:  .  pravastatin (PRAVACHOL) 40 MG tablet, Take 40 mg by mouth daily. , Disp: , Rfl:  .  traMADol (ULTRAM) 50 MG tablet, TAKE 1 TABLET BY MOUTH THREE TIMES DAILY AS NEEDED (Patient taking differently: Take 50 mg by mouth 3 (three) times daily as needed for moderate pain. ), Disp: 270 tablet, Rfl: 0 .  amoxicillin-clavulanate (AUGMENTIN) 875-125 MG tablet, Take 1 tablet by mouth 2 (two) times daily., Disp: 10 tablet, Rfl: 0 .  aspirin EC 81 MG tablet, Take 81 mg by mouth daily., Disp: , Rfl:  .  cetirizine (ZYRTEC) 10 MG tablet, Take 10 mg by mouth daily.,  Disp: , Rfl:  .  Cholecalciferol (VITAMIN D) 2000 UNITS CAPS, Take 2,000 Units by mouth daily. , Disp: , Rfl:  .  ibuprofen (ADVIL,MOTRIN) 200 MG tablet, Take 200 mg by mouth every 6 (six) hours as needed for pain., Disp: , Rfl:  .  nitrofurantoin, macrocrystal-monohydrate, (MACROBID) 100 MG capsule, Take 1 capsule (100 mg total) by mouth 2 (two) times daily. (Patient not taking: Reported on 05/04/2019), Disp: 12 capsule, Rfl: 0 .  polyethylene glycol powder (GLYCOLAX/MIRALAX) powder, Take one scoop in 8 oz of water daily, Disp: 527 g, Rfl: 3 .  polyethylene glycol powder (GLYCOLAX/MIRALAX) powder, take 17GM (DISSOLVED IN WATER) by mouth once daily, Disp: 765 g, Rfl: 3 .  polyethylene glycol powder (GLYCOLAX/MIRALAX) powder, TAKE 17GM(DISSOLVED IN WATER) BY MOUTH ONCE DAILY, Disp: 510 g, Rfl: 0 .  predniSONE (DELTASONE) 10 MG tablet, Begin taking 6 tablets daily, taper by one tablet every other day until off the medication., Disp: 42 tablet, Rfl: 0   Exam: Current vital signs: BP (!) 159/90   Pulse (!) 109   Temp 98.2 F (36.8 C) (Oral)   Resp 20   Wt 93.7 kg   SpO2 95%   BMI 35.45 kg/m  Vital signs in last 24 hours: Temp:  [97.8 F (36.6 C)-98.2 F (36.8 C)] 98.2 F (36.8 C) (12/24 0746) Pulse Rate:  [89-113] 109 (12/24  0900) Resp:  [13-24] 20 (12/24 0900) BP: (128-175)/(71-125) 159/90 (12/24 0900) SpO2:  [91 %-96 %] 95 % (12/24 0900) Weight:  [92.5 kg-93.7 kg] 93.7 kg (12/23 1955)  ROS:   General ROS: negative for - chills, fatigue, fever, night sweats, weight gain or weight loss Psychological ROS: negative for - behavioral disorder, hallucinations, memory difficulties, mood swings or suicidal ideation Ophthalmic ROS: negative for - blurry vision, double vision, eye pain or loss of vision ENT ROS: negative for - epistaxis, nasal discharge, oral lesions, sore throat, tinnitus or vertigo Respiratory ROS: negative for - cough, hemoptysis, shortness of breath or wheezing Cardiovascular ROS: negative for - chest pain, dyspnea on exertion, edema or irregular heartbeat Gastrointestinal ROS: negative for - abdominal pain, diarrhea, hematemesis, nausea/vomiting or stool incontinence Genito-Urinary ROS: negative for - dysuria, hematuria, incontinence or urinary frequency/urgency Musculoskeletal ROS: Positive for -spasticity and inability to move bilateral legs Neurological ROS: as noted in HPI Dermatological ROS: negative for rash and skin lesion changes   Physical Exam   Constitutional: Appears well-developed and well-nourished.  Psych: Affect appropriate to situation Eyes: No scleral injection HENT: No OP obstrucion Head: Normocephalic.  Cardiovascular: Normal rate and regular rhythm.  Respiratory: Effort normal, non-labored breathing GI: Soft.  No distension. There is no tenderness.  Skin: WDI  Neuro: Mental Status: Patient is alert and oriented, able to follow all commands the best of her ability, able to give a good history. Cranial Nerves: II: Visual Fields are full.  III,IV, VI: EOMI without ptosis or diploplia. Pupils equal, round and reactive to light V: Facial sensation is symmetric to temperature VII: Facial movement is symmetric.  VIII: hearing is intact to voice X: Palat elevates  symmetrically XI: Shoulder shrug is symmetric. XII: tongue is midline without atrophy or fasciculations.  Motor: Right upper extremities 5/5 with full range of motion, left upper extremity is 4/5 weaker proximally.  Patient has significant spasticity in bilateral lower extremities and due to multiple sclerosis unable to lift legs off table.  She is wheelchair-bound at baseline. Sensory: Sensation is symmetric to light touch and temperature in the arms and  legs with stocking distribution decreased sensation.  No vibratory sensation at the toes to the ankle.  Decreased temperature sensation in stocking distribution Deep Tendon Reflexes: 2+ and brisk bilateral upper extremities Plantars: Mute bilaterally Cerebellar: Nose on the right side shows no dysmetria, finger-to-nose on the left side shows dysmetria but not out of proportion for weakness  Labs I have reviewed labs in epic and the results pertinent to this consultation are:   CBC    Component Value Date/Time   WBC 5.7 09/22/2019 1948   RBC 4.00 09/22/2019 1948   HGB 13.3 09/22/2019 1948   HGB 12.3 03/16/2019 1133   HCT 39.1 09/22/2019 1948   HCT 35.1 03/16/2019 1133   PLT 275 09/22/2019 1948   PLT 225 03/16/2019 1133   MCV 97.8 09/22/2019 1948   MCV 92 03/16/2019 1133   MCH 33.3 09/22/2019 1948   MCHC 34.0 09/22/2019 1948   RDW 14.2 09/22/2019 1948   RDW 14.4 03/16/2019 1133   LYMPHSABS 1.2 09/22/2019 1948   LYMPHSABS 1.0 03/16/2019 1133   MONOABS 0.6 09/22/2019 1948   EOSABS 0.2 09/22/2019 1948   EOSABS 0.1 03/16/2019 1133   BASOSABS 0.0 09/22/2019 1948   BASOSABS 0.0 03/16/2019 1133    CMP     Component Value Date/Time   NA 139 09/22/2019 1948   NA 137 03/16/2019 1133   NA 142 09/16/2014 1420   K 3.8 09/22/2019 1948   K 3.9 09/16/2014 1420   CL 100 09/22/2019 1948   CL 105 09/16/2014 1420   CO2 28 09/22/2019 1948   CO2 29 09/16/2014 1420   GLUCOSE 145 (H) 09/22/2019 1948   GLUCOSE 111 (H) 09/16/2014 1420    BUN 18 09/22/2019 1948   BUN 25 03/16/2019 1133   BUN 21 (H) 09/16/2014 1420   CREATININE 0.69 09/22/2019 1948   CREATININE 0.86 09/16/2014 1420   CALCIUM 9.6 09/22/2019 1948   CALCIUM 9.2 09/16/2014 1420   PROT 7.3 09/22/2019 1948   PROT 6.2 03/16/2019 1133   PROT 7.7 09/16/2014 1420   ALBUMIN 4.2 09/22/2019 1948   ALBUMIN 4.3 03/16/2019 1133   ALBUMIN 4.0 09/16/2014 1420   AST 29 09/22/2019 1948   AST 41 (H) 09/16/2014 1420   ALT 27 09/22/2019 1948   ALT 42 09/16/2014 1420   ALKPHOS 58 09/22/2019 1948   ALKPHOS 69 09/16/2014 1420   BILITOT 0.6 09/22/2019 1948   BILITOT 0.5 03/16/2019 1133   BILITOT 0.6 09/16/2014 1420   GFRNONAA >60 09/22/2019 1948   GFRNONAA >60 09/16/2014 1420   GFRAA >60 09/22/2019 1948   GFRAA >60 09/16/2014 1420    Lipid Panel  No results found for: CHOL, TRIG, HDL, CHOLHDL, VLDL, LDLCALC, LDLDIRECT   Imaging I have reviewed the images obtained:  CT-scan of the brain-chronic small vessel disease without acute abnormality  CTA of head and neck-showed no abnormalities  Etta Quill PA-C [facility] [phone]  M-F  (9:00 am- 5:00 PM)  09/23/2019, 10:46 AM   I have seen the patient and reviewed the above note.  Plan: This is a pleasant 63 year old female presenting to [facility] secondary to sudden onset of dysarthria with weakness of left arm.  Patient did receive TPA and currently residual symptoms are left arm weakness.  Given sudden onset of symptoms I do believe patient has suffered from stroke.  Patient will be admitted to the hospital for further stroke work-up.   -Transthoracic Echo -Continue patient on ASA 325mg  daily,  -Start  Atorvastatin 80  mg/other high intensity statin -BP goal: less than 180/105 -HBAIC and Lipid profile -Telemetry monitoring -Frequent neuro checks -NPO until passes stroke swallow screen -MS: Continue Copaxone -Spasticity we will continue baclofen -Diabetes with SSI -Post TPA  order set has been ordered   # please page stroke NP  Or  PA  Or MD from 8am -4 pm  as this patient from this time will be  followed by the stroke.   You can look them up on [email or web address]  Password [email or web address]  This patient is critically ill and at significant risk of neurological worsening, death and care requires constant monitoring of vital signs, hemodynamics,respiratory and cardiac monitoring, neurological assessment, discussion with family, other specialists and medical decision making of high complexity. I spent 60 minutes of neurocritical care time  in the care of  this patient. This was time spent independent of any time provided by nurse practitioner or PA.  Roland Rack, MD [facility] [phone]  If 7pm- 7am, please page neurology on call as listed in [place]. 09/23/2019  5:48 PM

## 2019-09-23 NOTE — Progress Notes (Signed)
PT Cancellation Note  Patient Details Name: BRENNAN OSTREM MRN: [id number] DOB: October 03, 1955   Cancelled Treatment:    Reason Eval/Treat Not Completed: Active bedrest order s/p TPA; will attempt another day.    Reginia Naas 09/23/2019, 11:37 AM  Magda Kiel, [place] [phone] 09/23/2019

## 2019-09-23 NOTE — ED Provider Notes (Signed)
D/w Dr. Darl Householder at [facility] hospital in [place] - accepted ED to ED in transfer   Noemi Chapel, MD 09/23/19 [phone]

## 2019-09-23 NOTE — ED Notes (Signed)
Meal tray given but pt says is not hungry.  Ice water given.  Pt repositioned in bed.

## 2019-09-23 NOTE — ED Notes (Signed)
ED TO INPATIENT HANDOFF REPORT  ED Nurse Name and Phone #: Neita Landrigan  S Name/Age/Gender Gabriela Rice 63 y.o. female Room/Bed: 029C/029C  Code Status   Code Status: Full Code  Home/SNF/Other Home Patient oriented to: self, place, time and situation Is this baseline? Yes   Triage Complete: Triage complete  Chief Complaint Acute CVA (cerebrovascular accident) ([place]) [I63.9] Stroke (cerebrum) ([place]) [I63.9] Stroke [facility]) [I63.9]  Triage Note Pt from home via [place]. Pt was last seen normal 1655. Husband states that pt had left sided facial droop and left arm weakness. All symptoms resolved at this time. Pt with hx of MS.     Allergies Allergies  Allergen Reactions  . Penicillins Other (See Comments)    seizure as child Has patient had a PCN reaction causing immediate rash, facial/tongue/throat swelling, SOB or lightheadedness with hypotension: No Has patient had a PCN reaction causing severe rash involving mucus membranes or skin necrosis:No Has patient had a PCN reaction that required hospitalizationNo Has patient had a PCN reaction occurring within the last 10 years:No If all of the above answers are "NO", then may proceed with Cephalosporin use.    . Codeine Swelling    hands swell    Level of Care/Admitting Diagnosis ED Disposition    ED Disposition Condition [facility] Area: [place] [[id number]]  Level of Care: Progressive [102]  Admit to Progressive based on following criteria: NEUROLOGICAL AND NEUROSURGICAL complex patients with significant risk of instability, who do not meet ICU criteria, yet require close observation or frequent assessment (< / = every 2 - 4 hours) with medical / nursing intervention.  Covid Evaluation: N/A  Diagnosis: [id number]  Admitting Physician: Leonel Ramsay, MCNEILL P [[id number]]  Attending Physician: Leonel Ramsay, MCNEILL P [[id number]]  Estimated length of stay: 3 - 4 days  Certification:: I certify  there are rare and unusual circumstances requiring inpatient admission       B Medical/Surgery History Past Medical History:  Diagnosis Date  . Abnormality of gait 02/16/2013  . Allergic rhinitis   . Allergy   . Anxiety   . Autoimmune hepatitis ([place])   . Autoimmune hepatitis ([place])   . Colon polyps   . Diverticulosis   . DM (diabetes mellitus) ([place])   . Elevated LFTs   . Hemorrhoids   . Hemorrhoids   . Hepatitis    with cholestasis  . Hyperlipidemia   . Hypertension   . Migraines   . Multiple sclerosis ([place])   . Obesity   . Vitamin D deficiency    Past Surgical History:  Procedure Laterality Date  . ABDOMINAL HYSTERECTOMY    . CESAREAN SECTION     x 2  . MYOECTOMY    . TONSILLECTOMY AND ADENOIDECTOMY       A IV Location/Drains/Wounds Patient Lines/Drains/Airways Status   Active Line/Drains/Airways    Name:   Placement date:   Placement time:   Site:   Days:   Peripheral IV 09/22/19 Right Hand   09/22/19    2010    Hand   1   Peripheral IV 09/22/19 Left Antecubital   09/22/19    1954    Antecubital   1   Incision 03/31/13 Abdomen Right;Medial;Mid   03/31/13    1110     2367          Intake/Output Last 24 hours  Intake/Output Summary (Last 24 hours) at 09/23/2019 1609 Last data filed at 09/22/2019 2122 Gross per  24 hour  Intake 104.3 ml  Output --  Net 104.3 ml    Labs/Imaging Results for orders placed or performed during the hospital encounter of 09/22/19 (from the past 48 hour(s))  CBG monitoring, ED     Status: Abnormal   Collection Time: 09/22/19  7:47 PM  Result Value Ref Range   Glucose-Capillary 145 (H) 70 - 99 mg/dL  Ethanol     Status: None   Collection Time: 09/22/19  7:48 PM  Result Value Ref Range   Alcohol, Ethyl (B) <10 <10 mg/dL    Comment: (NOTE) Lowest detectable limit for serum alcohol is 10 mg/dL. For medical purposes only. Performed at [facility], [address], [place], [place] [address]   Protime-INR     Status: None    Collection Time: 09/22/19  7:48 PM  Result Value Ref Range   Prothrombin Time 12.1 11.4 - 15.2 seconds   INR 0.9 0.8 - 1.2    Comment: (NOTE) INR goal varies based on device and disease states. Performed at [facility], [address] [place], [place] [address]   APTT     Status: None   Collection Time: 09/22/19  7:48 PM  Result Value Ref Range   aPTT 31 24 - 36 seconds    Comment: Performed at [facility], [address] [place], [place] [address]  CBC     Status: None   Collection Time: 09/22/19  7:48 PM  Result Value Ref Range   WBC 5.7 4.0 - 10.5 K/uL   RBC 4.00 3.87 - 5.11 MIL/uL   Hemoglobin 13.3 12.0 - 15.0 g/dL   HCT 39.1 36.0 - 46.0 %   MCV 97.8 80.0 - 100.0 fL   MCH 33.3 26.0 - 34.0 pg   MCHC 34.0 30.0 - 36.0 g/dL   RDW 14.2 11.5 - 15.5 %   Platelets 275 150 - 400 K/uL   nRBC 0.0 0.0 - 0.2 %    Comment: Performed at [facility], [address] [place] [address], [place] [address]  Differential     Status: None   Collection Time: 09/22/19  7:48 PM  Result Value Ref Range   Neutrophils Relative % 65 %   Neutro Abs 3.7 1.7 - 7.7 K/uL   Lymphocytes Relative 20 %   Lymphs Abs 1.2 0.7 - 4.0 K/uL   Monocytes Relative 10 %   Monocytes Absolute 0.6 0.1 - 1.0 K/uL   Eosinophils Relative 4 %   Eosinophils Absolute 0.2 0.0 - 0.5 K/uL   Basophils Relative 1 %   Basophils Absolute 0.0 0.0 - 0.1 K/uL   Immature Granulocytes 0 %   Abs Immature Granulocytes 0.01 0.00 - 0.07 K/uL    Comment: Performed at [facility], [address], [place], [place] [address]  Comprehensive metabolic panel     Status: Abnormal   Collection Time: 09/22/19  7:48 PM  Result Value Ref Range   Sodium 139 135 - 145 mmol/L   Potassium 3.8 3.5 - 5.1 mmol/L   Chloride 100 98 - 111 mmol/L   CO2 28 22 - 32 mmol/L   Glucose, Bld 145 (H) 70 - 99 mg/dL   BUN 18 8 - 23 mg/dL   Creatinine, Ser 0.69 0.44 - 1.00 mg/dL   Calcium 9.6 8.9 - 10.3 mg/dL   Total Protein 7.3 6.5 - 8.1 g/dL   Albumin 4.2 3.5 - 5.0 g/dL    AST 29 15 - 41 U/L   ALT 27 0 - 44  U/L   Alkaline Phosphatase 58 38 - 126 U/L   Total Bilirubin 0.6 0.3 - 1.2 mg/dL   GFR calc non Af Amer >60 >60 mL/min   GFR calc Af Amer >60 >60 mL/min   Anion gap 11 5 - 15    Comment: Performed at [facility], [address], [place], [place] [address]  Respiratory Panel by RT PCR (Flu A&B, Covid) - Nasopharyngeal Swab     Status: None   Collection Time: 09/22/19  8:37 PM   Specimen: Nasopharyngeal Swab  Result Value Ref Range   SARS Coronavirus 2 by RT PCR NEGATIVE NEGATIVE    Comment: (NOTE) SARS-CoV-2 target nucleic acids are NOT DETECTED. The SARS-CoV-2 RNA is generally detectable in upper respiratoy specimens during the acute phase of infection. The lowest concentration of SARS-CoV-2 viral copies this assay can detect is 131 copies/mL. A negative result does not preclude SARS-Cov-2 infection and should not be used as the sole basis for treatment or other patient management decisions. A negative result may occur with  improper specimen collection/handling, submission of specimen other than nasopharyngeal swab, presence of viral mutation(s) within the areas targeted by this assay, and inadequate number of viral copies (<131 copies/mL). A negative result must be combined with clinical observations, patient history, and epidemiological information. The expected result is Negative. Fact Sheet for Patients:  PinkCheek.be Fact Sheet for Healthcare Providers:  GravelBags.it This test is not yet ap proved or cleared by the [place] FDA and  has been authorized for detection and/or diagnosis of SARS-CoV-2 by FDA under an Emergency Use Authorization (EUA). This EUA will remain  in effect (meaning this test can be used) for the duration of the COVID-19 declaration under Section 564(b)(1) of the Act, 21 U.S.C. section 360bbb-3(b)(1), unless the authorization is terminated or revoked  sooner.    Influenza A by PCR NEGATIVE NEGATIVE   Influenza B by PCR NEGATIVE NEGATIVE    Comment: (NOTE) The Xpert Xpress SARS-CoV-2/FLU/RSV assay is intended as an aid in  the diagnosis of influenza from Nasopharyngeal swab specimens and  should not be used as a sole basis for treatment. Nasal washings and  aspirates are unacceptable for Xpert Xpress SARS-CoV-2/FLU/RSV  testing. Fact Sheet for Patients: PinkCheek.be Fact Sheet for Healthcare Providers: GravelBags.it This test is not yet approved or cleared by the [place] FDA and  has been authorized for detection and/or diagnosis of SARS-CoV-2 by  FDA under an Emergency Use Authorization (EUA). This EUA will remain  in effect (meaning this test can be used) for the duration of the  Covid-19 declaration under Section 564(b)(1) of the Act, 21  U.S.C. section 360bbb-3(b)(1), unless the authorization is  terminated or revoked. Performed at [facility], [address] [place], [place] [address]    CT Angio Head W or [place] Contrast  Result Date: 09/22/2019 CLINICAL DATA:  Left facial droop and left arm weakness. History of multiple sclerosis. EXAM: CT ANGIOGRAPHY HEAD AND NECK TECHNIQUE: Multidetector CT imaging of the head and neck was performed using the standard protocol during bolus administration of intravenous contrast. Multiplanar CT image reconstructions and MIPs were obtained to evaluate the vascular anatomy. Carotid stenosis measurements (when applicable) are obtained utilizing NASCET criteria, using the distal internal carotid diameter as the denominator. CONTRAST:  76mL OMNIPAQUE IOHEXOL 350 MG/ML SOLN COMPARISON:  Head CT 09/22/2019 FINDINGS: CTA NECK FINDINGS SKELETON: There is no bony spinal canal stenosis. No lytic or blastic lesion. OTHER NECK: Normal pharynx, larynx and major salivary  glands. No cervical lymphadenopathy. Unremarkable thyroid gland. UPPER CHEST: No  pneumothorax or pleural effusion. No nodules or masses. AORTIC ARCH: There is no calcific atherosclerosis of the aortic arch. There is no aneurysm, dissection or hemodynamically significant stenosis of the visualized portion of the aorta. Conventional 3 vessel aortic branching pattern. The visualized proximal subclavian arteries are widely patent. RIGHT CAROTID SYSTEM: Normal without aneurysm, dissection or stenosis. LEFT CAROTID SYSTEM: Normal without aneurysm, dissection or stenosis. VERTEBRAL ARTERIES: Left dominant configuration. Both origins are clearly patent. There is no dissection, occlusion or flow-limiting stenosis to the skull base (V1-V3 segments). CTA HEAD FINDINGS POSTERIOR CIRCULATION: --Vertebral arteries: Normal V4 segments. --Posterior inferior cerebellar arteries (PICA): Patent origins from the vertebral arteries. --Anterior inferior cerebellar arteries (AICA): Patent origins from the basilar artery. --Basilar artery: Normal. --Superior cerebellar arteries: Normal. --Posterior cerebral arteries: Normal. Both originate from the basilar artery. Posterior communicating arteries (p-comm) are diminutive or absent. ANTERIOR CIRCULATION: --Intracranial internal carotid arteries: Normal. --Anterior cerebral arteries (ACA): Normal. Both A1 segments are present. Patent anterior communicating artery (a-comm). --Middle cerebral arteries (MCA): Normal. VENOUS SINUSES: As permitted by contrast timing, patent. ANATOMIC VARIANTS: None Review of the MIP images confirms the above findings. IMPRESSION: Normal CTA of the head and neck. Electronically Signed   By: Ulyses Jarred M.D.   On: 09/22/2019 22:12   CT HEAD WO CONTRAST  Result Date: 09/23/2019 CLINICAL DATA:  Left-sided weakness, follow-up EXAM: CT HEAD WITHOUT CONTRAST TECHNIQUE: Contiguous axial images were obtained from the base of the skull through the vertex without intravenous contrast. COMPARISON:  09/22/2019 FINDINGS: Brain: There is no acute  intracranial hemorrhage or mass effect. There is no new loss of gray-white differentiation to suggest evolving recent infarction. Patchy hypoattenuation in the supratentorial white matter probably reflects stable chronic microvascular ischemic changes. There is no extra-axial fluid collection. Ventricles and sulci are within normal limits in size and configuration. Vascular: There is intracranial atherosclerotic calcification at the skull base. Skull: Calvarium is unremarkable. Sinuses/Orbits: No acute finding. Other: None. IMPRESSION: No acute intracranial hemorrhage or evidence of evolving recent infarction. Stable chronic microvascular ischemic changes. Electronically Signed   By: Macy Mis M.D.   On: 09/23/2019 12:52   CT Angio Neck W and/or Wo Contrast  Result Date: 09/22/2019 CLINICAL DATA:  Left facial droop and left arm weakness. History of multiple sclerosis. EXAM: CT ANGIOGRAPHY HEAD AND NECK TECHNIQUE: Multidetector CT imaging of the head and neck was performed using the standard protocol during bolus administration of intravenous contrast. Multiplanar CT image reconstructions and MIPs were obtained to evaluate the vascular anatomy. Carotid stenosis measurements (when applicable) are obtained utilizing NASCET criteria, using the distal internal carotid diameter as the denominator. CONTRAST:  39mL OMNIPAQUE IOHEXOL 350 MG/ML SOLN COMPARISON:  Head CT 09/22/2019 FINDINGS: CTA NECK FINDINGS SKELETON: There is no bony spinal canal stenosis. No lytic or blastic lesion. OTHER NECK: Normal pharynx, larynx and major salivary glands. No cervical lymphadenopathy. Unremarkable thyroid gland. UPPER CHEST: No pneumothorax or pleural effusion. No nodules or masses. AORTIC ARCH: There is no calcific atherosclerosis of the aortic arch. There is no aneurysm, dissection or hemodynamically significant stenosis of the visualized portion of the aorta. Conventional 3 vessel aortic branching pattern. The visualized  proximal subclavian arteries are widely patent. RIGHT CAROTID SYSTEM: Normal without aneurysm, dissection or stenosis. LEFT CAROTID SYSTEM: Normal without aneurysm, dissection or stenosis. VERTEBRAL ARTERIES: Left dominant configuration. Both origins are clearly patent. There is no dissection, occlusion or flow-limiting stenosis to the skull base (  V1-V3 segments). CTA HEAD FINDINGS POSTERIOR CIRCULATION: --Vertebral arteries: Normal V4 segments. --Posterior inferior cerebellar arteries (PICA): Patent origins from the vertebral arteries. --Anterior inferior cerebellar arteries (AICA): Patent origins from the basilar artery. --Basilar artery: Normal. --Superior cerebellar arteries: Normal. --Posterior cerebral arteries: Normal. Both originate from the basilar artery. Posterior communicating arteries (p-comm) are diminutive or absent. ANTERIOR CIRCULATION: --Intracranial internal carotid arteries: Normal. --Anterior cerebral arteries (ACA): Normal. Both A1 segments are present. Patent anterior communicating artery (a-comm). --Middle cerebral arteries (MCA): Normal. VENOUS SINUSES: As permitted by contrast timing, patent. ANATOMIC VARIANTS: None Review of the MIP images confirms the above findings. IMPRESSION: Normal CTA of the head and neck. Electronically Signed   By: Ulyses Jarred M.D.   On: 09/22/2019 22:12   MR BRAIN WO CONTRAST  Result Date: 09/23/2019 CLINICAL DATA:  Left arm weakness and numbness, slurred speech, facial numbness; history multiple sclerosis EXAM: MRI HEAD WITHOUT CONTRAST TECHNIQUE: Multiplanar, multiecho pulse sequences of the brain and surrounding structures were obtained without intravenous contrast. COMPARISON:  None. FINDINGS: Brain: There is restricted diffusion in the right MCA territory involving the pre- and postcentral gyri in the region of the hand homunculus. Additional punctate focus is present in the right middle frontal gyrus. There is no evidence of intracranial hemorrhage.  Multiple foci of T2 hyperintensity are present in the periventricular greater than subcortical white matter in a pattern compatible with history of multiple sclerosis. One of these areas in the left parietal lobe involves cortex and may instead reflect a chronic infarct. There is also a small T2 hyperintense lesion of the right cerebellar hemisphere again compatible with history of demyelinating disease. There is no intracranial mass or significant mass effect. No extra-axial fluid collection. Ventricles and sulci are within normal limits in size and configuration. Vascular: Major vessel flow voids at the skull base are preserved. Skull and upper cervical spine: Normal marrow signal is preserved. Sinuses/Orbits: Paranasal sinuses are aerated. Orbits are unremarkable. Other: Sella is unremarkable.  Mastoid air cells are clear. IMPRESSION: Acute right frontoparietal infarcts including involvement of the primary sensorimotor gyri. Foci of abnormal T2 signal compatible with history of multiple sclerosis. An area in the left parietal lobe may reflect a chronic infarct rather than chronic demyelinating lesion. Electronically Signed   By: Macy Mis M.D.   On: 09/23/2019 14:14   ECHOCARDIOGRAM COMPLETE  Result Date: 09/23/2019   ECHOCARDIOGRAM REPORT   Patient Name:   Gabriela Rice Date of Exam: 09/23/2019 Medical Rec #:  [id number]         Height:       64.0 in Accession #:    [id number]        Weight:       206.5 lb Date of Birth:  November 12, 1955          BSA:          1.98 m Patient Age:    14 years          BP:           119/78 mmHg Patient Gender: F                 HR:           110 bpm. Exam Location:  Inpatient Procedure: 2D Echo, Cardiac Doppler and Color Doppler Indications:    Stroke  History:        Patient has prior history of Echocardiogram examinations, most  recent 08/22/2015. Abnormal ECG; Risk Factors:Hypertension,                 Diabetes and Dyslipidemia.  Sonographer:    Roseanna Rainbow  RDCS Referring Phys: [place] Comments: Technically difficult study due to poor echo windows, patient is morbidly obese and no subcostal window. Image acquisition challenging due to patient body habitus. Patient had trouble with pressure from probe in apicals. Has a painful rash under breast. IMPRESSIONS  1. Left ventricular ejection fraction, by visual estimation, is 65 to 70%. The left ventricle has hyperdynamic function. There is mildly increased left ventricular hypertrophy.  2. Indeterminate diastolic filling due to E-A fusion.  3. The left ventricle has no regional wall motion abnormalities.  4. Global right ventricle has hyperdynamic systolic function.The right ventricular size is normal. No increase in right ventricular wall thickness.  5. Left atrial size was normal.  6. Right atrial size was normal.  7. Moderate pericardial effusion.  8. The pericardial effusion is posterior to the left ventricle.  9. Mild calcification of the anterior mitral valve leaflet(s). 10. Mild mitral annular calcification. 11. The mitral valve is normal in structure. Trivial mitral valve regurgitation. 12. The tricuspid valve is grossly normal. 13. The aortic valve is normal in structure. Aortic valve regurgitation is not visualized. Mild aortic valve sclerosis without stenosis. 14. The pulmonic valve was grossly normal. Pulmonic valve regurgitation is not visualized. 15. The aortic root was not well visualized. 16. TR signal is inadequate for assessing pulmonary artery systolic pressure. 17. The inferior vena cava is normal in size with greater than 50% respiratory variability, suggesting right atrial pressure of 3 mmHg. FINDINGS  Left Ventricle: Left ventricular ejection fraction, by visual estimation, is 65 to 70%. The left ventricle has hyperdynamic function. The left ventricle has no regional wall motion abnormalities. There is mildly increased left ventricular hypertrophy. Concentric left ventricular  hypertrophy. Indeterminate diastolic filling due to E-A fusion. Right Ventricle: The right ventricular size is normal. No increase in right ventricular wall thickness. Global RV systolic function is has hyperdynamic systolic function. Left Atrium: Left atrial size was normal in size. Right Atrium: Right atrial size was normal in size Pericardium: A moderately sized pericardial effusion is present. The pericardial effusion is posterior to the left ventricle. Mitral Valve: The mitral valve is normal in structure. There is mild thickening of the mitral valve leaflet(s). There is mild calcification of the anterior mitral valve leaflet(s). Mild mitral annular calcification. Trivial mitral valve regurgitation. Tricuspid Valve: The tricuspid valve is grossly normal. Tricuspid valve regurgitation is not demonstrated. Aortic Valve: The aortic valve is normal in structure. Aortic valve regurgitation is not visualized. Mild aortic valve sclerosis is present, with no evidence of aortic valve stenosis. Pulmonic Valve: The pulmonic valve was grossly normal. Pulmonic valve regurgitation is not visualized. Pulmonic regurgitation is not visualized. Aorta: The aortic root was not well visualized and the aortic root and ascending aorta are structurally normal, with no evidence of dilitation. Venous: The inferior vena cava is normal in size with greater than 50% respiratory variability, suggesting right atrial pressure of 3 mmHg. IAS/Shunts: No atrial level shunt detected by color flow Doppler.  LEFT VENTRICLE PLAX 2D LVIDd:         3.00 cm       Diastology LVIDs:         2.30 cm       LV e' lateral:   7.94 cm/s LV PW:  1.30 cm       LV E/e' lateral: 14.9 LV IVS:        1.30 cm       LV e' medial:    4.90 cm/s LVOT diam:     1.90 cm       LV E/e' medial:  24.1 LV SV:         17 ml LV SV Index:   8.05 LVOT Area:     2.84 cm  LV Volumes (MOD) LV area d, A2C:    18.40 cm LV area d, A4C:    21.10 cm LV area s, A2C:    8.86 cm LV  area s, A4C:    9.12 cm LV major d, A2C:   6.18 cm LV major d, A4C:   6.12 cm LV major s, A2C:   5.06 cm LV major s, A4C:   4.65 cm LV vol d, MOD A2C: 47.1 ml LV vol d, MOD A4C: 58.8 ml LV vol s, MOD A2C: 13.4 ml LV vol s, MOD A4C: 14.7 ml LV SV MOD A2C:     33.7 ml LV SV MOD A4C:     58.8 ml LV SV MOD BP:      38.1 ml RIGHT VENTRICLE RV S prime:     23.90 cm/s LEFT ATRIUM             Index LA diam:        2.70 cm 1.36 cm/m LA Vol (A2C):   45.0 ml 22.69 ml/m LA Vol (A4C):   48.5 ml 24.46 ml/m LA Biplane Vol: 49.9 ml 25.16 ml/m  AORTIC VALVE LVOT Vmax:   137.00 cm/s LVOT Vmean:  92.300 cm/s LVOT VTI:    0.221 m  AORTA Ao Asc diam: 2.90 cm MITRAL VALVE MV Area (PHT): 6.32 cm             SHUNTS MV PHT:        34.80 msec           Systemic VTI:  0.22 m MV Decel Time: 120 msec             Systemic Diam: 1.90 cm MV E velocity: 118.00 cm/s 103 cm/s  Dani Gobble Croitoru MD Electronically signed by Sanda Klein MD Signature Date/Time: 09/23/2019/4:07:20 PM    Final    CT HEAD CODE STROKE WO CONTRAST  Result Date: 09/22/2019 CLINICAL DATA:  Code stroke. Left sided weakness EXAM: CT HEAD WITHOUT CONTRAST TECHNIQUE: Contiguous axial images were obtained from the base of the skull through the vertex without intravenous contrast. COMPARISON:  None. FINDINGS: Brain: There is no mass, hemorrhage or extra-axial collection. The size and configuration of the ventricles and extra-axial CSF spaces are normal. There is hypoattenuation of the periventricular white matter, most commonly indicating chronic ischemic microangiopathy. Vascular: No abnormal hyperdensity of the major intracranial arteries or dural venous sinuses. No intracranial atherosclerosis. Skull: The visualized skull base, calvarium and extracranial soft tissues are normal. Sinuses/Orbits: No fluid levels or advanced mucosal thickening of the visualized paranasal sinuses. No mastoid or middle ear effusion. The orbits are normal. ASPECTS [facility] Stroke Program  Early CT Score) - Ganglionic level infarction (caudate, lentiform nuclei, internal capsule, insula, M1-M3 cortex): 7 - Supraganglionic infarction (M4-M6 cortex): 3 Total score (0-10 with 10 being normal): 10 IMPRESSION: 1. Chronic small vessel disease without acute abnormality. 2. ASPECTS is 10. 3. These results were called by telephone at the time of interpretation on 09/22/2019 at 7:49 pm to provider  Veryl Speak , who verbally acknowledged these results. Electronically Signed   By: Ulyses Jarred M.D.   On: 09/22/2019 19:49    Pending Labs Unresulted Labs (From admission, onward)    Start     Ordered   09/24/19 0500  CBC  Tomorrow morning,   R     09/23/19 1034   09/24/19 0500  Hemoglobin A1c  Tomorrow morning,   R     09/23/19 1101   09/24/19 0500  Lipid panel  Tomorrow morning,   R    Comments: Fasting    09/23/19 1101   09/23/19 1241  Hemoglobin A1c  Once,   STAT    Comments: To assess prior glycemic control    09/23/19 1241   09/23/19 1056  CBC  Once,   STAT     09/23/19 1101   09/23/19 1033  HIV Antibody (routine testing w rflx)  (HIV Antibody (Routine testing w reflex) panel)  Once,   STAT     09/23/19 1034   09/22/19 1933  Urine rapid drug screen (hosp performed)  ONCE - STAT,   STAT     09/22/19 1933   09/22/19 1933  Urinalysis, Routine w reflex microscopic  ONCE - STAT,   STAT     09/22/19 1933          Vitals/Pain Today's Vitals   09/23/19 0746 09/23/19 0900 09/23/19 1501 09/23/19 1515  BP: (!) 168/99 (!) 159/90 (!) 146/85 119/78  Pulse: (!) 113 (!) 109 (!) 114 (!) 114  Resp: 20 20 17 14   Temp: 98.2 F (36.8 C)     TempSrc: Oral     SpO2: 95% 95% 94% 92%  Weight:      PainSc:        Isolation Precautions No active isolations  Medications Medications   stroke: mapping our early stages of recovery book (has no administration in time range)  acetaminophen (TYLENOL) tablet 650 mg (has no administration in time range)    Or  acetaminophen (TYLENOL)  suppository 650 mg (has no administration in time range)  azaTHIOprine (IMURAN) tablet 150 mg (has no administration in time range)  baclofen (LIORESAL) tablet 10 mg (has no administration in time range)  loratadine (CLARITIN) tablet 10 mg (has no administration in time range)  Glatiramer Acetate SOSY 40 mg (has no administration in time range)  DULoxetine (CYMBALTA) DR capsule 90 mg (has no administration in time range)  gabapentin (NEURONTIN) capsule 300 mg (300 mg Oral Not Given 09/23/19 1100)  pravastatin (PRAVACHOL) tablet 40 mg (has no administration in time range)  traMADol (ULTRAM) tablet 50 mg (has no administration in time range)  0.9 %  sodium chloride infusion ( Intravenous New Bag/Given 09/23/19 1608)  senna-docusate (Senokot-S) tablet 1 tablet (has no administration in time range)  labetalol (NORMODYNE) injection 20 mg (0 mg Intravenous Hold 09/23/19 1522)    And  clevidipine (CLEVIPREX) infusion 0.5 mg/mL (0 mg/hr Intravenous Hold 09/23/19 1523)  midazolam (VERSED) injection 2 mg (0 mg Intravenous Hold 09/23/19 1523)  ondansetron (ZOFRAN) injection 4 mg (has no administration in time range)  insulin aspart (novoLOG) injection 0-15 Units (0 Units Subcutaneous Not Given 09/23/19 1245)  baclofen (LIORESAL) tablet 15 mg (has no administration in time range)  alteplase (ACTIVASE) 1 mg/mL infusion 84.3 mg (0 mg/kg  93.7 kg Intravenous Stopped 09/22/19 2121)    Followed by  0.9 %  sodium chloride infusion (0 mLs Intravenous Stopped 09/22/19 2122)  iohexol (OMNIPAQUE) 350 MG/ML injection 75  mL (75 mLs Intravenous Contrast Given 09/22/19 2139)  fentaNYL (SUBLIMAZE) injection 50 mcg (50 mcg Intravenous Given 09/23/19 0212)  traMADol (ULTRAM) tablet 50 mg (50 mg Oral Given 09/23/19 0938)  LORazepam (ATIVAN) injection 0.5 mg (0.5 mg Intravenous Given 09/23/19 1303)    Mobility non-ambulatory Moderate fall risk   Focused Assessments Neuro Assessment Handoff:  Swallow screen pass?  Yes    NIH Stroke Scale ( + Modified Stroke Scale Criteria)  Interval: Other (Comment)(q28min post tpa) Level of Consciousness (1a.)   : Alert, keenly responsive LOC Questions (1b. )   +: Answers both questions correctly LOC Commands (1c. )   + : Performs both tasks correctly Best Gaze (2. )  +: Normal Visual (3. )  +: No visual loss Facial Palsy (4. )    : Normal symmetrical movements Motor Arm, Left (5a. )   +: Drift Motor Arm, Right (5b. )   +: No drift Motor Leg, Left (6a. )   +: No effort against gravity Motor Leg, Right (6b. )   +: No effort against gravity Limb Ataxia (7. ): Present in two limbs Sensory (8. )   +: Normal, no sensory loss Best Language (9. )   +: No aphasia Dysarthria (10. ): Normal Extinction/Inattention (11.)   +: No Abnormality Modified SS Total  +: 7 Complete NIHSS TOTAL: 10 Last date known well: 09/22/19 Last time known well: 1655 Neuro Assessment: Exceptions to WDL Neuro Checks:   Initial (09/22/19 1947)  Last Documented NIHSS Modified Score: 7 (09/23/19 1608) Has TPA been given? Yes Temp: 98.2 F (36.8 C) (12/24 0746) Temp Source: Oral (12/24 0746) BP: 119/78 (12/24 1515) Pulse Rate: 114 (12/24 1515) If patient is a Neuro Trauma and patient is going to OR before floor call report to [place] nurse: [phone] or [phone]     R Recommendations: See Admitting Provider Note  Report given to:   Additional Notes:

## 2019-09-23 NOTE — ED Notes (Signed)
Patient transported to MRI 

## 2019-09-23 NOTE — Progress Notes (Signed)
  Echocardiogram 2D Echocardiogram has been performed.  Gabriela Rice 09/23/2019, 3:38 PM

## 2019-09-23 NOTE — ED Provider Notes (Signed)
Patient arrived to the ED to ED transfer with neuro ICU admission.  Stable on arrival.  Dr. Leonel Ramsay neurology was notified to assume care of the patient.   Wyvonnia Dusky, MD 09/23/19 1043

## 2019-09-23 NOTE — ED Notes (Signed)
Pt in ct 

## 2019-09-24 ENCOUNTER — Inpatient Hospital Stay (HOSPITAL_COMMUNITY): Payer: PPO

## 2019-09-24 ENCOUNTER — Encounter (HOSPITAL_COMMUNITY): Payer: Self-pay

## 2019-09-24 LAB — GLUCOSE, CAPILLARY
Glucose-Capillary: 102 mg/dL — ABNORMAL HIGH (ref 70–99)
Glucose-Capillary: 111 mg/dL — ABNORMAL HIGH (ref 70–99)
Glucose-Capillary: 123 mg/dL — ABNORMAL HIGH (ref 70–99)
Glucose-Capillary: 153 mg/dL — ABNORMAL HIGH (ref 70–99)
Glucose-Capillary: 168 mg/dL — ABNORMAL HIGH (ref 70–99)
Glucose-Capillary: 178 mg/dL — ABNORMAL HIGH (ref 70–99)

## 2019-09-24 LAB — CBC
HCT: 38.5 % (ref 36.0–46.0)
Hemoglobin: 13.9 g/dL (ref 12.0–15.0)
MCH: 33.4 pg (ref 26.0–34.0)
MCHC: 36.1 g/dL — ABNORMAL HIGH (ref 30.0–36.0)
MCV: 92.5 fL (ref 80.0–100.0)
Platelets: 279 10*3/uL (ref 150–400)
RBC: 4.16 MIL/uL (ref 3.87–5.11)
RDW: 14.1 % (ref 11.5–15.5)
WBC: 8 10*3/uL (ref 4.0–10.5)
nRBC: 0 % (ref 0.0–0.2)

## 2019-09-24 LAB — LIPID PANEL
Cholesterol: 200 mg/dL (ref 0–200)
HDL: 52 mg/dL (ref >40)
LDL Cholesterol: 125 mg/dL — ABNORMAL HIGH (ref 0–99)
Total CHOL/HDL Ratio: 3.8 RATIO
Triglycerides: 114 mg/dL (ref <150)
VLDL: 23 mg/dL (ref 0–40)

## 2019-09-24 LAB — HEMOGLOBIN A1C
Hgb A1c MFr Bld: 6.7 % — ABNORMAL HIGH (ref 4.8–5.6)
Mean Plasma Glucose: 145.59 mg/dL

## 2019-09-24 MED ORDER — ATORVASTATIN CALCIUM 40 MG PO TABS
40.0000 mg | ORAL_TABLET | Freq: Every day | ORAL | Status: DC
  Administered 2019-09-25 – 2019-09-27 (×3): 40 mg via ORAL
  Filled 2019-09-24 (×4): qty 1

## 2019-09-24 MED ORDER — ASPIRIN EC 81 MG PO TBEC
81.0000 mg | DELAYED_RELEASE_TABLET | Freq: Every day | ORAL | Status: DC
  Administered 2019-09-24 – 2019-09-25 (×2): 81 mg via ORAL
  Filled 2019-09-24 (×2): qty 1

## 2019-09-24 MED ORDER — LOSARTAN POTASSIUM 50 MG PO TABS
100.0000 mg | ORAL_TABLET | Freq: Every day | ORAL | Status: DC
  Administered 2019-09-24 – 2019-09-29 (×6): 100 mg via ORAL
  Filled 2019-09-24 (×8): qty 2

## 2019-09-24 MED ORDER — CLOPIDOGREL BISULFATE 75 MG PO TABS
75.0000 mg | ORAL_TABLET | Freq: Every day | ORAL | Status: DC
  Administered 2019-09-24 – 2019-09-25 (×2): 75 mg via ORAL
  Filled 2019-09-24 (×3): qty 1

## 2019-09-24 MED ORDER — ENOXAPARIN SODIUM 40 MG/0.4ML ~~LOC~~ SOLN
40.0000 mg | SUBCUTANEOUS | Status: DC
  Administered 2019-09-24 – 2019-09-25 (×2): 40 mg via SUBCUTANEOUS
  Filled 2019-09-24 (×2): qty 0.4

## 2019-09-24 MED ORDER — INSULIN ASPART 100 UNIT/ML ~~LOC~~ SOLN
0.0000 [IU] | Freq: Three times a day (TID) | SUBCUTANEOUS | Status: DC
  Administered 2019-09-24 – 2019-09-25 (×2): 3 [IU] via SUBCUTANEOUS
  Administered 2019-09-25 – 2019-09-26 (×2): 2 [IU] via SUBCUTANEOUS
  Administered 2019-09-26 – 2019-09-28 (×5): 3 [IU] via SUBCUTANEOUS
  Administered 2019-09-28: 2 [IU] via SUBCUTANEOUS
  Administered 2019-09-28 – 2019-09-29 (×2): 3 [IU] via SUBCUTANEOUS
  Administered 2019-09-29: 2 [IU] via SUBCUTANEOUS

## 2019-09-24 NOTE — Plan of Care (Signed)
  Problem: Education: Goal: Knowledge of disease or condition will improve Outcome: Progressing Goal: Knowledge of secondary prevention will improve Outcome: Progressing Goal: Knowledge of patient specific risk factors addressed and post discharge goals established will improve Outcome: Progressing   Problem: Coping: Goal: Will verbalize positive feelings about self Outcome: Progressing Goal: Will identify appropriate support needs Outcome: Progressing   Problem: Health Behavior/Discharge Planning: Goal: Ability to manage health-related needs will improve Outcome: Progressing   Problem: Self-Care: Goal: Ability to participate in self-care as condition permits will improve Outcome: Progressing Goal: Verbalization of feelings and concerns over difficulty with self-care will improve Outcome: Progressing Goal: Ability to communicate needs accurately will improve Outcome: Progressing   Problem: Nutrition: Goal: Risk of aspiration will decrease Outcome: Progressing Goal: Dietary intake will improve Outcome: Progressing

## 2019-09-24 NOTE — Progress Notes (Signed)
PT Cancellation Note  Patient Details Name: Gabriela Rice MRN: [id number] DOB: 1956/03/29   Cancelled Treatment:    Reason Eval/Treat Not Completed: Active bedrest order. Pt remains on "strict bedrest" order. Please discharge order when pt appropriate for PT evaluation of OOB mobility. PT will follow-up as schedule permits.  Mabeline Caras, PT, DPT Acute Rehabilitation Services  Pager [phone] Office [place] 09/24/2019, 7:08 AM

## 2019-09-24 NOTE — Progress Notes (Signed)
STROKE TEAM PROGRESS NOTE  Interval history I personally reviewed history of presenting illness, electronic medical records and imaging films in PACS.  She presented with sudden onset of left upper extremity weakness and slurred speech when she laid with her head in Trendelenburg position while her husband was changing her clothes.  She states speech has improved but left hand weakness is not back to normal.  She has history of multiple sclerosis for greater than 20 years and is wheelchair-bound for the last 4 to 5 months following a fall.  She is on Copaxone and is followed by Dr. Jannifer Franklin  Vitals:   09/23/19 2015 09/23/19 2341 09/24/19 0400 09/24/19 0809  BP: (!) 156/97 (!) 144/89 (!) 156/89 (!) 148/91  Pulse: (!) 112 (!) 117 96 (!) 105  Resp:  18 20 19   Temp: 98.7 F (37.1 C) 98.8 F (37.1 C) 97.7 F (36.5 C) 99 F (37.2 C)  TempSrc: Axillary Oral Oral Oral  SpO2: 96% 97% 97% 95%  Weight:      Height:        CBC:  Recent Labs  Lab 09/22/19 1948 09/23/19 1815 09/24/19 0315  WBC 5.7 7.5 8.0  NEUTROABS 3.7  --   --   HGB 13.3 13.0 13.9  HCT 39.1 36.8 38.5  MCV 97.8 92.7 92.5  PLT 275 276 [id number]    Basic Metabolic Panel:  Recent Labs  Lab 09/22/19 1948  NA 139  K 3.8  CL 100  CO2 28  GLUCOSE 145*  BUN 18  CREATININE 0.69  CALCIUM 9.6   Lipid Panel:     Component Value Date/Time   CHOL 200 09/24/2019 0315   TRIG 114 09/24/2019 0315   HDL 52 09/24/2019 0315   CHOLHDL 3.8 09/24/2019 0315   VLDL 23 09/24/2019 0315   LDLCALC 125 (H) 09/24/2019 0315   HgbA1c:  Lab Results  Component Value Date   HGBA1C 6.7 (H) 09/24/2019   Urine Drug Screen: No results found for: LABOPIA, COCAINSCRNUR, LABBENZ, AMPHETMU, THCU, LABBARB  Alcohol Level     Component Value Date/Time   ETH <10 09/22/2019 1948    IMAGING CT Angio Head W or Wo Contrast  Result Date: 09/22/2019 CLINICAL DATA:  Left facial droop and left arm weakness. History of multiple sclerosis. EXAM: CT  ANGIOGRAPHY HEAD AND NECK TECHNIQUE: Multidetector CT imaging of the head and neck was performed using the standard protocol during bolus administration of intravenous contrast. Multiplanar CT image reconstructions and MIPs were obtained to evaluate the vascular anatomy. Carotid stenosis measurements (when applicable) are obtained utilizing NASCET criteria, using the distal internal carotid diameter as the denominator. CONTRAST:  46mL OMNIPAQUE IOHEXOL 350 MG/ML SOLN COMPARISON:  Head CT 09/22/2019 FINDINGS: CTA NECK FINDINGS SKELETON: There is no bony spinal canal stenosis. No lytic or blastic lesion. OTHER NECK: Normal pharynx, larynx and major salivary glands. No cervical lymphadenopathy. Unremarkable thyroid gland. UPPER CHEST: No pneumothorax or pleural effusion. No nodules or masses. AORTIC ARCH: There is no calcific atherosclerosis of the aortic arch. There is no aneurysm, dissection or hemodynamically significant stenosis of the visualized portion of the aorta. Conventional 3 vessel aortic branching pattern. The visualized proximal subclavian arteries are widely patent. RIGHT CAROTID SYSTEM: Normal without aneurysm, dissection or stenosis. LEFT CAROTID SYSTEM: Normal without aneurysm, dissection or stenosis. VERTEBRAL ARTERIES: Left dominant configuration. Both origins are clearly patent. There is no dissection, occlusion or flow-limiting stenosis to the skull base (V1-V3 segments). CTA HEAD FINDINGS POSTERIOR CIRCULATION: --Vertebral arteries: Normal V4  segments. --Posterior inferior cerebellar arteries (PICA): Patent origins from the vertebral arteries. --Anterior inferior cerebellar arteries (AICA): Patent origins from the basilar artery. --Basilar artery: Normal. --Superior cerebellar arteries: Normal. --Posterior cerebral arteries: Normal. Both originate from the basilar artery. Posterior communicating arteries (p-comm) are diminutive or absent. ANTERIOR CIRCULATION: --Intracranial internal carotid  arteries: Normal. --Anterior cerebral arteries (ACA): Normal. Both A1 segments are present. Patent anterior communicating artery (a-comm). --Middle cerebral arteries (MCA): Normal. VENOUS SINUSES: As permitted by contrast timing, patent. ANATOMIC VARIANTS: None Review of the MIP images confirms the above findings. IMPRESSION: Normal CTA of the head and neck. Electronically Signed   By: Ulyses Jarred M.D.   On: 09/22/2019 22:12   CT HEAD WO CONTRAST  Result Date: 09/24/2019 CLINICAL DATA:  Stroke follow-up EXAM: CT HEAD WITHOUT CONTRAST TECHNIQUE: Contiguous axial images were obtained from the base of the skull through the vertex without intravenous contrast. COMPARISON:  Head CT 09/23/2019 FINDINGS: Brain: There is no mass, hemorrhage or extra-axial collection. The size and configuration of the ventricles and extra-axial CSF spaces are normal. Mild hypoattenuation at the site of known right frontoparietal junction infarct. Vascular: No abnormal hyperdensity of the major intracranial arteries or dural venous sinuses. No intracranial atherosclerosis. Skull: The visualized skull base, calvarium and extracranial soft tissues are normal. Sinuses/Orbits: No fluid levels or advanced mucosal thickening of the visualized paranasal sinuses. No mastoid or middle ear effusion. The orbits are normal. IMPRESSION: Expected evolution of right frontoparietal junction infarct without hemorrhage or mass effect. Electronically Signed   By: Ulyses Jarred M.D.   On: 09/24/2019 02:00   CT HEAD WO CONTRAST  Result Date: 09/23/2019 CLINICAL DATA:  Left-sided weakness, follow-up EXAM: CT HEAD WITHOUT CONTRAST TECHNIQUE: Contiguous axial images were obtained from the base of the skull through the vertex without intravenous contrast. COMPARISON:  09/22/2019 FINDINGS: Brain: There is no acute intracranial hemorrhage or mass effect. There is no new loss of gray-white differentiation to suggest evolving recent infarction. Patchy  hypoattenuation in the supratentorial white matter probably reflects stable chronic microvascular ischemic changes. There is no extra-axial fluid collection. Ventricles and sulci are within normal limits in size and configuration. Vascular: There is intracranial atherosclerotic calcification at the skull base. Skull: Calvarium is unremarkable. Sinuses/Orbits: No acute finding. Other: None. IMPRESSION: No acute intracranial hemorrhage or evidence of evolving recent infarction. Stable chronic microvascular ischemic changes. Electronically Signed   By: Macy Mis M.D.   On: 09/23/2019 12:52   CT Angio Neck W and/or Wo Contrast  Result Date: 09/22/2019 CLINICAL DATA:  Left facial droop and left arm weakness. History of multiple sclerosis. EXAM: CT ANGIOGRAPHY HEAD AND NECK TECHNIQUE: Multidetector CT imaging of the head and neck was performed using the standard protocol during bolus administration of intravenous contrast. Multiplanar CT image reconstructions and MIPs were obtained to evaluate the vascular anatomy. Carotid stenosis measurements (when applicable) are obtained utilizing NASCET criteria, using the distal internal carotid diameter as the denominator. CONTRAST:  88mL OMNIPAQUE IOHEXOL 350 MG/ML SOLN COMPARISON:  Head CT 09/22/2019 FINDINGS: CTA NECK FINDINGS SKELETON: There is no bony spinal canal stenosis. No lytic or blastic lesion. OTHER NECK: Normal pharynx, larynx and major salivary glands. No cervical lymphadenopathy. Unremarkable thyroid gland. UPPER CHEST: No pneumothorax or pleural effusion. No nodules or masses. AORTIC ARCH: There is no calcific atherosclerosis of the aortic arch. There is no aneurysm, dissection or hemodynamically significant stenosis of the visualized portion of the aorta. Conventional 3 vessel aortic branching pattern. The visualized proximal subclavian  arteries are widely patent. RIGHT CAROTID SYSTEM: Normal without aneurysm, dissection or stenosis. LEFT CAROTID SYSTEM:  Normal without aneurysm, dissection or stenosis. VERTEBRAL ARTERIES: Left dominant configuration. Both origins are clearly patent. There is no dissection, occlusion or flow-limiting stenosis to the skull base (V1-V3 segments). CTA HEAD FINDINGS POSTERIOR CIRCULATION: --Vertebral arteries: Normal V4 segments. --Posterior inferior cerebellar arteries (PICA): Patent origins from the vertebral arteries. --Anterior inferior cerebellar arteries (AICA): Patent origins from the basilar artery. --Basilar artery: Normal. --Superior cerebellar arteries: Normal. --Posterior cerebral arteries: Normal. Both originate from the basilar artery. Posterior communicating arteries (p-comm) are diminutive or absent. ANTERIOR CIRCULATION: --Intracranial internal carotid arteries: Normal. --Anterior cerebral arteries (ACA): Normal. Both A1 segments are present. Patent anterior communicating artery (a-comm). --Middle cerebral arteries (MCA): Normal. VENOUS SINUSES: As permitted by contrast timing, patent. ANATOMIC VARIANTS: None Review of the MIP images confirms the above findings. IMPRESSION: Normal CTA of the head and neck. Electronically Signed   By: Ulyses Jarred M.D.   On: 09/22/2019 22:12   MR BRAIN WO CONTRAST  Result Date: 09/23/2019 CLINICAL DATA:  Left arm weakness and numbness, slurred speech, facial numbness; history multiple sclerosis EXAM: MRI HEAD WITHOUT CONTRAST TECHNIQUE: Multiplanar, multiecho pulse sequences of the brain and surrounding structures were obtained without intravenous contrast. COMPARISON:  None. FINDINGS: Brain: There is restricted diffusion in the right MCA territory involving the pre- and postcentral gyri in the region of the hand homunculus. Additional punctate focus is present in the right middle frontal gyrus. There is no evidence of intracranial hemorrhage. Multiple foci of T2 hyperintensity are present in the periventricular greater than subcortical white matter in a pattern compatible with  history of multiple sclerosis. One of these areas in the left parietal lobe involves cortex and may instead reflect a chronic infarct. There is also a small T2 hyperintense lesion of the right cerebellar hemisphere again compatible with history of demyelinating disease. There is no intracranial mass or significant mass effect. No extra-axial fluid collection. Ventricles and sulci are within normal limits in size and configuration. Vascular: Major vessel flow voids at the skull base are preserved. Skull and upper cervical spine: Normal marrow signal is preserved. Sinuses/Orbits: Paranasal sinuses are aerated. Orbits are unremarkable. Other: Sella is unremarkable.  Mastoid air cells are clear. IMPRESSION: Acute right frontoparietal infarcts including involvement of the primary sensorimotor gyri. Foci of abnormal T2 signal compatible with history of multiple sclerosis. An area in the left parietal lobe may reflect a chronic infarct rather than chronic demyelinating lesion. Electronically Signed   By: Macy Mis M.D.   On: 09/23/2019 14:14   ECHOCARDIOGRAM COMPLETE  Result Date: 09/23/2019   ECHOCARDIOGRAM REPORT   Patient Name:   Gabriela Rice Date of Exam: 09/23/2019 Medical Rec #:  [id number]         Height:       64.0 in Accession #:    [id number]        Weight:       206.5 lb Date of Birth:  [birth year]          BSA:          1.98 m Patient Age:    32 years          BP:           119/78 mmHg Patient Gender: F                 HR:           110 bpm.  Exam Location:  Inpatient Procedure: 2D Echo, Cardiac Doppler and Color Doppler Indications:    Stroke  History:        Patient has prior history of Echocardiogram examinations, most                 recent 08/22/2015. Abnormal ECG; Risk Factors:Hypertension,                 Diabetes and Dyslipidemia.  Sonographer:    Roseanna Rainbow RDCS Referring Phys: [place] Comments: Technically difficult study due to poor echo windows, patient is morbidly  obese and no subcostal window. Image acquisition challenging due to patient body habitus. Patient had trouble with pressure from probe in apicals. Has a painful rash under breast. IMPRESSIONS  1. Left ventricular ejection fraction, by visual estimation, is 65 to 70%. The left ventricle has hyperdynamic function. There is mildly increased left ventricular hypertrophy.  2. Indeterminate diastolic filling due to E-A fusion.  3. The left ventricle has no regional wall motion abnormalities.  4. Global right ventricle has hyperdynamic systolic function.The right ventricular size is normal. No increase in right ventricular wall thickness.  5. Left atrial size was normal.  6. Right atrial size was normal.  7. Moderate pericardial effusion.  8. The pericardial effusion is posterior to the left ventricle.  9. Mild calcification of the anterior mitral valve leaflet(s). 10. Mild mitral annular calcification. 11. The mitral valve is normal in structure. Trivial mitral valve regurgitation. 12. The tricuspid valve is grossly normal. 13. The aortic valve is normal in structure. Aortic valve regurgitation is not visualized. Mild aortic valve sclerosis without stenosis. 14. The pulmonic valve was grossly normal. Pulmonic valve regurgitation is not visualized. 15. The aortic root was not well visualized. 16. TR signal is inadequate for assessing pulmonary artery systolic pressure. 17. The inferior vena cava is normal in size with greater than 50% respiratory variability, suggesting right atrial pressure of 3 mmHg. FINDINGS  Left Ventricle: Left ventricular ejection fraction, by visual estimation, is 65 to 70%. The left ventricle has hyperdynamic function. The left ventricle has no regional wall motion abnormalities. There is mildly increased left ventricular hypertrophy. Concentric left ventricular hypertrophy. Indeterminate diastolic filling due to E-A fusion. Right Ventricle: The right ventricular size is normal. No increase in  right ventricular wall thickness. Global RV systolic function is has hyperdynamic systolic function. Left Atrium: Left atrial size was normal in size. Right Atrium: Right atrial size was normal in size Pericardium: A moderately sized pericardial effusion is present. The pericardial effusion is posterior to the left ventricle. Mitral Valve: The mitral valve is normal in structure. There is mild thickening of the mitral valve leaflet(s). There is mild calcification of the anterior mitral valve leaflet(s). Mild mitral annular calcification. Trivial mitral valve regurgitation. Tricuspid Valve: The tricuspid valve is grossly normal. Tricuspid valve regurgitation is not demonstrated. Aortic Valve: The aortic valve is normal in structure. Aortic valve regurgitation is not visualized. Mild aortic valve sclerosis is present, with no evidence of aortic valve stenosis. Pulmonic Valve: The pulmonic valve was grossly normal. Pulmonic valve regurgitation is not visualized. Pulmonic regurgitation is not visualized. Aorta: The aortic root was not well visualized and the aortic root and ascending aorta are structurally normal, with no evidence of dilitation. Venous: The inferior vena cava is normal in size with greater than 50% respiratory variability, suggesting right atrial pressure of 3 mmHg. IAS/Shunts: No atrial level shunt detected by color flow Doppler.  LEFT  VENTRICLE PLAX 2D LVIDd:         3.00 cm       Diastology LVIDs:         2.30 cm       LV e' lateral:   7.94 cm/s LV PW:         1.30 cm       LV E/e' lateral: 14.9 LV IVS:        1.30 cm       LV e' medial:    4.90 cm/s LVOT diam:     1.90 cm       LV E/e' medial:  24.1 LV SV:         17 ml LV SV Index:   8.05 LVOT Area:     2.84 cm  LV Volumes (MOD) LV area d, A2C:    18.40 cm LV area d, A4C:    21.10 cm LV area s, A2C:    8.86 cm LV area s, A4C:    9.12 cm LV major d, A2C:   6.18 cm LV major d, A4C:   6.12 cm LV major s, A2C:   5.06 cm LV major s, A4C:   4.65 cm  LV vol d, MOD A2C: 47.1 ml LV vol d, MOD A4C: 58.8 ml LV vol s, MOD A2C: 13.4 ml LV vol s, MOD A4C: 14.7 ml LV SV MOD A2C:     33.7 ml LV SV MOD A4C:     58.8 ml LV SV MOD BP:      38.1 ml RIGHT VENTRICLE RV S prime:     23.90 cm/s LEFT ATRIUM             Index LA diam:        2.70 cm 1.36 cm/m LA Vol (A2C):   45.0 ml 22.69 ml/m LA Vol (A4C):   48.5 ml 24.46 ml/m LA Biplane Vol: 49.9 ml 25.16 ml/m  AORTIC VALVE LVOT Vmax:   137.00 cm/s LVOT Vmean:  92.300 cm/s LVOT VTI:    0.221 m  AORTA Ao Asc diam: 2.90 cm MITRAL VALVE MV Area (PHT): 6.32 cm             SHUNTS MV PHT:        34.80 msec           Systemic VTI:  0.22 m MV Decel Time: 120 msec             Systemic Diam: 1.90 cm MV E velocity: 118.00 cm/s 103 cm/s  Dani Gobble Croitoru MD Electronically signed by Sanda Klein MD Signature Date/Time: 09/23/2019/4:07:20 PM    Final    CT HEAD CODE STROKE WO CONTRAST  Result Date: 09/22/2019 CLINICAL DATA:  Code stroke. Left sided weakness EXAM: CT HEAD WITHOUT CONTRAST TECHNIQUE: Contiguous axial images were obtained from the base of the skull through the vertex without intravenous contrast. COMPARISON:  None. FINDINGS: Brain: There is no mass, hemorrhage or extra-axial collection. The size and configuration of the ventricles and extra-axial CSF spaces are normal. There is hypoattenuation of the periventricular white matter, most commonly indicating chronic ischemic microangiopathy. Vascular: No abnormal hyperdensity of the major intracranial arteries or dural venous sinuses. No intracranial atherosclerosis. Skull: The visualized skull base, calvarium and extracranial soft tissues are normal. Sinuses/Orbits: No fluid levels or advanced mucosal thickening of the visualized paranasal sinuses. No mastoid or middle ear effusion. The orbits are normal. ASPECTS [facility] Stroke Program Early CT Score) - Ganglionic level infarction (caudate,  lentiform nuclei, internal capsule, insula, M1-M3 cortex): 7 - Supraganglionic  infarction (M4-M6 cortex): 3 Total score (0-10 with 10 being normal): 10 IMPRESSION: 1. Chronic small vessel disease without acute abnormality. 2. ASPECTS is 10. 3. These results were called by telephone at the time of interpretation on 09/22/2019 at 7:49 pm to provider Veryl Speak , who verbally acknowledged these results. Electronically Signed   By: Ulyses Jarred M.D.   On: 09/22/2019 19:49    PHYSICAL EXAM Pleasant middle-aged Caucasian lady sitting up comfortably in bed.  Not in distress. . Afebrile. Head is nontraumatic. Neck is supple without bruit.    Cardiac exam no murmur or gallop. Lungs are clear to auscultation. Distal pulses are well felt. Neurological Exam :  She is awake alert oriented to time place and person.  There is no dysarthria or aphasia or apraxia.  Extraocular movements are full range without nystagmus.  She blinks to threat bilaterally.  Fundi were not visualized.  Face is symmetric without weakness.  Tongue midline.  Motor system exam shows no upper extremity drift but mild weakness of intrinsic hand muscles and grip on the left with orbits right over left upper extremity.  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  She is able to wiggle her toes in both feet.  Deep tendon reflexes are brisker in the lower extremities and upper extremities.  Tone is increased in the lower extremities.  No clonus is noted.  Touch pinprick sensation is preserved bilaterally.  Vibration sense is diminished in both lower extremities from ankle down.  Plantars are both upgoing.  Gait not tested.    ASSESSMENT/PLAN Ms. Gabriela Rice is a 63 y.o. female with history of debilitating MS, diabetes, autoimmune hepatitis, hypertension, hyperlipidemia, anxiety, wheelchair-bound secondary to [place], obesity and fractured ankle in Aug 1010, presenting to [facility] with L arm weakness and slurred speech acutely 12/23 at 1655. Received tPA @ [facility] at 12/23 at 2121. Transferred to [facility]/24.    Stroke:   R frontoparietal infarcts s/p tPA, embolic secondary to unknown source  Code Stroke CT head No acute abnormality. Small vessel disease. ASPECTS 10.     CTA head & neck Unremarkable   Repeat CT post tPA no ICH. Small vessel disease.   MRI  R frontoparietal infarcts. Abnormal T2 signal, hx MS. ? L parietal infarct vs demyelinating dz  2D o Echo EF 65-70%. No source of embolus   LE dopplers pending   TCD w/ bubble study pending   Needs TEE and loop to look for embolic source. If d/c over the weekend, can do as an OP - I sent a heads up message to [place] - send another based on final timing   LDL 125  HgbA1c 6.7  SCDs for VTE prophylaxis changed to Lovenox 40 mg sq daily    aspirin 81 mg daily prior to admission, now on No antithrombotic. Given mild stroke, recommend aspirin 81 mg and plavix 75 mg daily x 3 weeks, then PLAVIX alone. Orders adjusted.   Therapy recommendations:  pending   Disposition:  pending (w/c bound since Aug 2020 after fx ankle, limited function at baseline d/t MS, husband provides care)   Hypertension  Home meds:  Cozaar 100  Stable . Resume cozaar . Long-term BP goal normotensive  Hyperlipidemia  Home meds:  pravachol 40, resumed in hospital  LDL 125, goal < 70  Continue statin at discharge  Diabetes type II Controlled  Home meds:  glucophage 500  HgbA1c 6.7, goal < 7.0  CBGs ac&hx  SSI  wc  Other Stroke Risk Factors  Former Cigarette smoker, quit 23 yrs ago  Obesity, Body mass index is 35.36 kg/m., recommend weight loss, diet and exercise as appropriate   Migraines  Other Active Problems  broke ankle in Aug 2020 and has not been able to stand since that time   MS  Autoimmune hepatitis  Hospital day # 2  I have personally obtained history,examined this patient, reviewed notes, independently viewed imaging studies, participated in medical decision making and  plan of care.ROS completed by me personally and pertinent positives fully documented  I have made any additions or clarifications directly to the above note.  She presented with sudden onset of dysarthria and left upper extremity weakness when she was in a reverse Trendelenburg position raising possibility of paradoxical embolism.  Recommend check transcranial Doppler bubble study for PFO and lower extremity venous Dopplers for DVT.  If these are needing she will eventually need transesophageal echocardiogram and prolonged cardiac monitoring with loop recorder.  Aspirin and Plavix for 3 weeks followed by aspirin alone.  Statin and aggressive risk factor modification.  Long discussion with patient and answered questions.  Greater than 50% time during this 35-minute visit was spent on counseling and coordination of care and discussion with care team.  Antony Contras, MD Medical Director [place] Pager: [phone] 09/24/2019 1:58 PM   To contact Stroke Continuity provider, please refer to [email or web address] After hours, contact General Neurology

## 2019-09-24 NOTE — Progress Notes (Signed)
HR-130. MD paged. Orders given to monitor and page again if HR continues to elevate. RN will continue to closely monitor.

## 2019-09-25 ENCOUNTER — Inpatient Hospital Stay (HOSPITAL_COMMUNITY): Payer: PPO

## 2019-09-25 ENCOUNTER — Encounter (HOSPITAL_COMMUNITY): Payer: Self-pay | Admitting: Neurology

## 2019-09-25 DIAGNOSIS — I82443 Acute embolism and thrombosis of tibial vein, bilateral: Secondary | ICD-10-CM

## 2019-09-25 DIAGNOSIS — R2243 Localized swelling, mass and lump, lower limb, bilateral: Secondary | ICD-10-CM

## 2019-09-25 DIAGNOSIS — M79662 Pain in left lower leg: Secondary | ICD-10-CM

## 2019-09-25 DIAGNOSIS — G35 Multiple sclerosis: Secondary | ICD-10-CM

## 2019-09-25 DIAGNOSIS — K754 Autoimmune hepatitis: Secondary | ICD-10-CM

## 2019-09-25 DIAGNOSIS — E1159 Type 2 diabetes mellitus with other circulatory complications: Secondary | ICD-10-CM

## 2019-09-25 DIAGNOSIS — M79661 Pain in right lower leg: Secondary | ICD-10-CM

## 2019-09-25 LAB — GLUCOSE, CAPILLARY
Glucose-Capillary: 120 mg/dL — ABNORMAL HIGH (ref 70–99)
Glucose-Capillary: 120 mg/dL — ABNORMAL HIGH (ref 70–99)
Glucose-Capillary: 127 mg/dL — ABNORMAL HIGH (ref 70–99)
Glucose-Capillary: 129 mg/dL — ABNORMAL HIGH (ref 70–99)
Glucose-Capillary: 155 mg/dL — ABNORMAL HIGH (ref 70–99)
Glucose-Capillary: 175 mg/dL — ABNORMAL HIGH (ref 70–99)

## 2019-09-25 LAB — BASIC METABOLIC PANEL
Anion gap: 13 (ref 5–15)
BUN: 13 mg/dL (ref 8–23)
CO2: 27 mmol/L (ref 22–32)
Calcium: 9.1 mg/dL (ref 8.9–10.3)
Chloride: 102 mmol/L (ref 98–111)
Creatinine, Ser: 0.63 mg/dL (ref 0.44–1.00)
GFR calc Af Amer: >60 mL/min (ref >60)
GFR calc non Af Amer: >60 mL/min (ref >60)
Glucose, Bld: 124 mg/dL — ABNORMAL HIGH (ref 70–99)
Potassium: 3.2 mmol/L — ABNORMAL LOW (ref 3.5–5.1)
Sodium: 142 mmol/L (ref 135–145)

## 2019-09-25 LAB — URINALYSIS, ROUTINE W REFLEX MICROSCOPIC
Bilirubin Urine: NEGATIVE
Glucose, UA: >=500 mg/dL — AB
Ketones, ur: 20 mg/dL — AB
Nitrite: NEGATIVE
Protein, ur: 30 mg/dL — AB
RBC / HPF: >50 RBC/hpf — ABNORMAL HIGH (ref 0–5)
Specific Gravity, Urine: 1.009 (ref 1.005–1.030)
WBC, UA: >50 WBC/hpf — ABNORMAL HIGH (ref 0–5)
pH: 5 (ref 5.0–8.0)

## 2019-09-25 LAB — CBC
HCT: 37.4 % (ref 36.0–46.0)
Hemoglobin: 12.7 g/dL (ref 12.0–15.0)
MCH: 32.7 pg (ref 26.0–34.0)
MCHC: 34 g/dL (ref 30.0–36.0)
MCV: 96.4 fL (ref 80.0–100.0)
Platelets: 233 10*3/uL (ref 150–400)
RBC: 3.88 MIL/uL (ref 3.87–5.11)
RDW: 14.6 % (ref 11.5–15.5)
WBC: 7.4 10*3/uL (ref 4.0–10.5)
nRBC: 0 % (ref 0.0–0.2)

## 2019-09-25 MED ORDER — CHLORHEXIDINE GLUCONATE CLOTH 2 % EX PADS
6.0000 | MEDICATED_PAD | Freq: Every day | CUTANEOUS | Status: DC
  Administered 2019-09-25 – 2019-09-29 (×5): 6 via TOPICAL

## 2019-09-25 MED ORDER — SODIUM CHLORIDE 0.9 % IV SOLN
1.0000 g | INTRAVENOUS | Status: DC
  Administered 2019-09-25 – 2019-09-29 (×5): 1 g via INTRAVENOUS
  Filled 2019-09-25 (×5): qty 10

## 2019-09-25 MED ORDER — HEPARIN (PORCINE) 25000 UT/250ML-% IV SOLN
1100.0000 [IU]/h | INTRAVENOUS | Status: DC
  Administered 2019-09-25: 950 [IU]/h via INTRAVENOUS
  Filled 2019-09-25: qty 250

## 2019-09-25 NOTE — Progress Notes (Signed)
PT Discharge Note  Patient Details Name: Gabriela Rice MRN: [id number] DOB: 11-May-1956   Cancelled Treatment:    Reason Eval/Treat Not Completed: PT screened, no new needs identified, will sign off. Pt is bedbound at home due to [place] and ankle fracture in August. We discussed role of PT in acute care and pt did not see the benefit at this time. She is interested in OT for left UE assessment and home exercise program.  PT to sign-off.     Melvern Banker 09/25/2019, 10:40 AM Lavonia Dana, PT   Acute Rehabilitation Services  Pager [phone] Office [phone] 09/25/2019

## 2019-09-25 NOTE — Plan of Care (Signed)
  Problem: Education: Goal: Knowledge of disease or condition will improve Outcome: Progressing Goal: Knowledge of secondary prevention will improve Outcome: Progressing Goal: Knowledge of patient specific risk factors addressed and post discharge goals established will improve Outcome: Progressing   Problem: Coping: Goal: Will verbalize positive feelings about self Outcome: Progressing Goal: Will identify appropriate support needs Outcome: Progressing   Problem: Health Behavior/Discharge Planning: Goal: Ability to manage health-related needs will improve Outcome: Progressing   Problem: Self-Care: Goal: Ability to participate in self-care as condition permits will improve Outcome: Progressing Goal: Verbalization of feelings and concerns over difficulty with self-care will improve Outcome: Progressing Goal: Ability to communicate needs accurately will improve Outcome: Progressing   Problem: Nutrition: Goal: Risk of aspiration will decrease Outcome: Progressing Goal: Dietary intake will improve Outcome: Progressing   Problem: Intracerebral Hemorrhage Tissue Perfusion: Goal: Complications of Intracerebral Hemorrhage will be minimized Outcome: Progressing   Problem: Ischemic Stroke/TIA Tissue Perfusion: Goal: Complications of ischemic stroke/TIA will be minimized Outcome: Progressing   Problem: Spontaneous Subarachnoid Hemorrhage Tissue Perfusion: Goal: Complications of Spontaneous Subarachnoid Hemorrhage will be minimized Outcome: Progressing   Problem: Education: Goal: Knowledge of General Education information will improve Description: Including pain rating scale, medication(s)/side effects and non-pharmacologic comfort measures Outcome: Progressing   Problem: Health Behavior/Discharge Planning: Goal: Ability to manage health-related needs will improve Outcome: Progressing   Problem: Clinical Measurements: Goal: Ability to maintain clinical measurements within  normal limits will improve Outcome: Progressing Goal: Will remain free from infection Outcome: Progressing Goal: Diagnostic test results will improve Outcome: Progressing Goal: Respiratory complications will improve Outcome: Progressing Goal: Cardiovascular complication will be avoided Outcome: Progressing   Problem: Activity: Goal: Risk for activity intolerance will decrease Outcome: Progressing   Problem: Nutrition: Goal: Adequate nutrition will be maintained Outcome: Progressing   Problem: Coping: Goal: Level of anxiety will decrease Outcome: Progressing   Problem: Elimination: Goal: Will not experience complications related to bowel motility Outcome: Progressing Goal: Will not experience complications related to urinary retention Outcome: Progressing   Problem: Pain Managment: Goal: General experience of comfort will improve Outcome: Progressing   Problem: Safety: Goal: Ability to remain free from injury will improve Outcome: Progressing   Problem: Skin Integrity: Goal: Risk for impaired skin integrity will decrease Outcome: Progressing   

## 2019-09-25 NOTE — Progress Notes (Signed)
VASCULAR LAB PRELIMINARY  PRELIMINARY  PRELIMINARY  PRELIMINARY  Bilateral lower extremity venous duplex completed.    Preliminary report:  See CV proc for preliminary results.  Burna Forts, RN results.  Esme Freund, RVT 09/25/2019, 6:17 PM

## 2019-09-25 NOTE — Progress Notes (Signed)
ANTICOAGULATION CONSULT NOTE - Initial Consult  Pharmacy Consult for heparin Indication: DVT and stroke  Allergies  Allergen Reactions  . Penicillins Other (See Comments)    seizure as child Has patient had a PCN reaction causing immediate rash, facial/tongue/throat swelling, SOB or lightheadedness with hypotension: No Has patient had a PCN reaction causing severe rash involving mucus membranes or skin necrosis:No Has patient had a PCN reaction that required hospitalizationNo Has patient had a PCN reaction occurring within the last 10 years:No If all of the above answers are "NO", then may proceed with Cephalosporin use.    . Codeine Swelling    hands swell    Patient Measurements: Height: 5\' 4"  (162.6 cm) Weight: 206 lb (93.4 kg) IBW/kg (Calculated) : 54.7 Heparin Dosing Weight: 75.9 kg  Vital Signs: Temp: 99.2 F (37.3 C) (12/26 1650) Temp Source: Oral (12/26 1650) BP: 145/86 (12/26 1650) Pulse Rate: 114 (12/26 1650)  Labs: Recent Labs    09/22/19 1948 09/23/19 1815 09/24/19 0315 09/25/19 0327  HGB 13.3 13.0 13.9 12.7  HCT 39.1 36.8 38.5 37.4  PLT 275 276 279 233  APTT 31  --   --   --   LABPROT 12.1  --   --   --   INR 0.9  --   --   --   CREATININE 0.69  --   --  0.63    Estimated Creatinine Clearance: 79.8 mL/min (by C-G formula based on SCr of 0.63 mg/dL).   Medical History: Past Medical History:  Diagnosis Date  . Abnormality of gait 02/16/2013  . Allergic rhinitis   . Allergy   . Anxiety   . Autoimmune hepatitis ([place])   . Autoimmune hepatitis ([place])   . Colon polyps   . Diverticulosis   . DM (diabetes mellitus) ([place])   . Elevated LFTs   . Hemorrhoids   . Hemorrhoids   . Hepatitis    with cholestasis  . Hyperlipidemia   . Hypertension   . Migraines   . Multiple sclerosis ([place])   . Obesity   . Vitamin D deficiency     Medications:  Scheduled:  . aspirin EC  81 mg Oral Daily  . atorvastatin  40 mg Oral q1800  . azaTHIOprine  150 mg  Oral Daily  . baclofen  10 mg Oral Daily  . baclofen  15 mg Oral QHS  . Chlorhexidine Gluconate Cloth  6 each Topical Daily  . clopidogrel  75 mg Oral Daily  . DULoxetine  90 mg Oral Daily  . gabapentin  300 mg Oral TID  . Glatiramer Acetate  40 mg Subcutaneous Q M,W,F  . insulin aspart  0-15 Units Subcutaneous TID WC  . loratadine  10 mg Oral Daily  . losartan  100 mg Oral Daily  . midazolam  2 mg Intravenous Once    Assessment: 31 yof presenting to [facility] finding to have a stroke (received tPA on 12/23@2121 ). Duplex came back with acute DVT bilaterally.   Hgb 12.7, plt 233. Last dose enoxaparin 40 mg on 12/25@1211 . No s/sx of bleeding.   Goal of Therapy:  Heparin level 0.3-0.5 units/ml Monitor platelets by anticoagulation protocol: Yes   Plan:  Start heparin infusion at 950 units/hr Check anti-Xa level in 6 hours and daily while on heparin Continue to monitor H&H and platelets  Antonietta Jewel, PharmD, BCCCP Clinical Pharmacist  Phone: [phone]  Please check AMION for all [place] phone numbers After 10:00 PM, call [place] [phone] 09/25/2019,6:33  PM

## 2019-09-25 NOTE — Progress Notes (Signed)
DVT seen on venous ultrasound. Will start stroke protocol heparin.   Roland Rack, MD [facility] [phone]  If 7pm- 7am, please page neurology on call as listed in [place].

## 2019-09-25 NOTE — Progress Notes (Addendum)
STROKE TEAM PROGRESS NOTE  Interval history Husband at bedside. Pt AAO x 3, left UE weakness much improved, but still has pronator drift. She has been bed bound for the last 3 months and her left leg was weaker than left at the baseline. This time she did not feel any changes of left LE.   However, she said 5 years ago she had heart palpitation and HR 140s. She went to see her PCP and referred to cardiology. Had heart monitoring for 2-3 days and was told fine. She later was worked up for OSA and had no more heart palpitation.    Vitals:   09/25/19 0724 09/25/19 1122 09/25/19 1123 09/25/19 1650  BP: 135/79 140/82 (!) 136/91 (!) 145/86  Pulse: (!) 107 (!) 112 (!) 115 (!) 114  Resp: 16 18  19   Temp: 98.7 F (37.1 C) 98.6 F (37 C)  99.2 F (37.3 C)  TempSrc: Oral Oral  Oral  SpO2: 98% 98% 96% 94%  Weight:      Height:        CBC:  Recent Labs  Lab 09/22/19 1948 09/24/19 0315 09/25/19 0327  WBC 5.7 8.0 7.4  NEUTROABS 3.7  --   --   HGB 13.3 13.9 12.7  HCT 39.1 38.5 37.4  MCV 97.8 92.5 96.4  PLT 275 279 0000000    Basic Metabolic Panel:  Recent Labs  Lab 09/22/19 1948 09/25/19 0327  NA 139 142  K 3.8 3.2*  CL 100 102  CO2 28 27  GLUCOSE 145* 124*  BUN 18 13  CREATININE 0.69 0.63  CALCIUM 9.6 9.1   Lipid Panel:     Component Value Date/Time   CHOL 200 09/24/2019 0315   TRIG 114 09/24/2019 0315   HDL 52 09/24/2019 0315   CHOLHDL 3.8 09/24/2019 0315   VLDL 23 09/24/2019 0315   LDLCALC 125 (H) 09/24/2019 0315   HgbA1c:  Lab Results  Component Value Date   HGBA1C 6.7 (H) 09/24/2019   Urine Drug Screen: No results found for: LABOPIA, COCAINSCRNUR, LABBENZ, AMPHETMU, THCU, LABBARB  Alcohol Level     Component Value Date/Time   ETH <10 09/22/2019 1948    IMAGING CT HEAD WO CONTRAST  Result Date: 09/24/2019 CLINICAL DATA:  Stroke follow-up EXAM: CT HEAD WITHOUT CONTRAST TECHNIQUE: Contiguous axial images were obtained from the base of the skull through the  vertex without intravenous contrast. COMPARISON:  Head CT 09/23/2019 FINDINGS: Brain: There is no mass, hemorrhage or extra-axial collection. The size and configuration of the ventricles and extra-axial CSF spaces are normal. Mild hypoattenuation at the site of known right frontoparietal junction infarct. Vascular: No abnormal hyperdensity of the major intracranial arteries or dural venous sinuses. No intracranial atherosclerosis. Skull: The visualized skull base, calvarium and extracranial soft tissues are normal. Sinuses/Orbits: No fluid levels or advanced mucosal thickening of the visualized paranasal sinuses. No mastoid or middle ear effusion. The orbits are normal. IMPRESSION: Expected evolution of right frontoparietal junction infarct without hemorrhage or mass effect. Electronically Signed   By: Ulyses Jarred M.D.   On: 09/24/2019 02:00    PHYSICAL EXAM Pleasant middle-aged Caucasian lady sitting up comfortably in bed.  Not in distress. . Afebrile. Head is nontraumatic. Neck is supple without bruit.    Cardiac exam no murmur or gallop. Lungs are clear to auscultation. Distal pulses are well felt.  Neurological Exam :  She is awake alert oriented to time place and person.  There is no dysarthria or aphasia or  apraxia.  Extraocular movements are full range without nystagmus.  She blinks to threat bilaterally.  Fundi were not visualized.  Face is symmetric without weakness.  Tongue midline.  Motor system exam shows left pronator drift with mild weakness of intrinsic hand muscles and grip on the left. 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  She is able to wiggle her toes in both feet, right DF 3/5 and left DF 2/5.  Deep tendon reflexes are brisker in the lower extremities and upper extremities.  Tone is increased in the lower extremities.  No clonus is noted.  Touch pinprick sensation is preserved bilaterally.  Vibration sense is diminished in  both lower extremities from ankle down.  Plantars are both upgoing.  Gait not tested.    ASSESSMENT/PLAN Gabriela Rice is a 63 y.o. female with history of debilitating MS, diabetes, autoimmune hepatitis, hypertension, hyperlipidemia, anxiety, wheelchair-bound secondary to [place], obesity and fractured ankle in Aug 1010, presenting to [facility] with L arm weakness and slurred speech acutely 12/23 at 1655. Received tPA @ [facility] at 12/23 at 2121. Transferred to [facility].    Stroke:   R frontoparietal infarcts s/p tPA, embolic secondary to unknown source - concerning for paradoxical emboli given b/l DVT  CT head No acute abnormality. Small vessel disease. ASPECTS 10.     CTA head & neck Unremarkable   MRI  R frontoparietal infarcts. Abnormal T2 signal, hx MS. ? L parietal infarct vs demyelinating dz  2D o Echo EF 65-70%. No source of embolus   LE dopplers b/l posterior tibial DVT  Needs TEE and loop to look for embolic source - plan for Monday (message sent to cardiology)  LDL 125  HgbA1c 6.7  SCDs for VTE prophylaxis changed to Lovenox 40 mg sq daily    aspirin 81 mg daily prior to admission, now on heparin IV. Will transition to eliquis in am.   Therapy recommendations:  Home PT   Disposition:  pending   LE DVT  LE venous doppler showed b/l posterior tibial DVT  Although distal DVT, given her bed-bound condition, AC is indicated and likely to be life long  Started on heparin IV per stroke protocol  Plan to transition to eliquis in am  Pending TEE to evaluate PFO  MS   Has been bed bound for 3 months  Followed by Dr. Jannifer Franklin at [facility]  Continue home PT/OT  On Copaxone  Continue to follow up with Dr. Jannifer Franklin as outpt  Hypertension  Home meds:  Cozaar 100  Stable . Resume cozaar . Long-term BP goal normotensive  Hyperlipidemia  Home meds:  pravachol 40, resumed in hospital  LDL 125, goal < 70  Continue statin at  discharge  Diabetes type II Controlled  Home meds:  glucophage 500  HgbA1c 6.7, goal < 7.0  CBGs ac&hx  SSI  wc  Other Stroke Risk Factors  Former Cigarette smoker, quit 23 yrs ago  Obesity, Body mass index is 35.36 kg/m., recommend weight loss, diet and exercise as appropriate   Migraines  Other Active Problems  broke ankle in Aug 2020 and has not been able to stand since that time   Autoimmune hepatitis on [facility] day #3  I spent  35 minutes in total face-to-face time with the patient, more than 50% of which was spent in counseling and coordination of care, reviewing test results, images and medication,  and discussing the diagnosis of stroke, b/l DVT, MS with bed bound condition, treatment plan and potential prognosis. This patient's care requiresreview of multiple databases, neurological assessment, discussion with family, other specialists and medical decision making of high complexity. I had long discussion with pt and husband at bedside, updated pt current condition, treatment plan and potential prognosis, and answered all the questions. They expressed understanding and appreciation.    Gabriela Hawking, MD PhD Stroke Neurology 09/25/2019 11:08 PM  To contact Stroke Continuity provider, please refer to [email or web address] After hours, contact General Neurology

## 2019-09-26 ENCOUNTER — Inpatient Hospital Stay (HOSPITAL_COMMUNITY): Payer: PPO

## 2019-09-26 DIAGNOSIS — I82443 Acute embolism and thrombosis of tibial vein, bilateral: Secondary | ICD-10-CM | POA: Diagnosis present

## 2019-09-26 DIAGNOSIS — E876 Hypokalemia: Secondary | ICD-10-CM

## 2019-09-26 DIAGNOSIS — I63411 Cerebral infarction due to embolism of right middle cerebral artery: Secondary | ICD-10-CM

## 2019-09-26 DIAGNOSIS — R338 Other retention of urine: Secondary | ICD-10-CM

## 2019-09-26 DIAGNOSIS — N390 Urinary tract infection, site not specified: Secondary | ICD-10-CM

## 2019-09-26 LAB — HEPARIN LEVEL (UNFRACTIONATED)
Heparin Unfractionated: 0.14 IU/mL — ABNORMAL LOW (ref 0.30–0.70)
Heparin Unfractionated: 0.46 IU/mL (ref 0.30–0.70)

## 2019-09-26 LAB — BASIC METABOLIC PANEL
Anion gap: 10 (ref 5–15)
BUN: 8 mg/dL (ref 8–23)
CO2: 25 mmol/L (ref 22–32)
Calcium: 8.9 mg/dL (ref 8.9–10.3)
Chloride: 107 mmol/L (ref 98–111)
Creatinine, Ser: 0.56 mg/dL (ref 0.44–1.00)
GFR calc Af Amer: >60 mL/min (ref >60)
GFR calc non Af Amer: >60 mL/min (ref >60)
Glucose, Bld: 141 mg/dL — ABNORMAL HIGH (ref 70–99)
Potassium: 3 mmol/L — ABNORMAL LOW (ref 3.5–5.1)
Sodium: 142 mmol/L (ref 135–145)

## 2019-09-26 LAB — GLUCOSE, CAPILLARY
Glucose-Capillary: 125 mg/dL — ABNORMAL HIGH (ref 70–99)
Glucose-Capillary: 132 mg/dL — ABNORMAL HIGH (ref 70–99)
Glucose-Capillary: 144 mg/dL — ABNORMAL HIGH (ref 70–99)
Glucose-Capillary: 159 mg/dL — ABNORMAL HIGH (ref 70–99)
Glucose-Capillary: 184 mg/dL — ABNORMAL HIGH (ref 70–99)
Glucose-Capillary: 209 mg/dL — ABNORMAL HIGH (ref 70–99)

## 2019-09-26 LAB — CBC
HCT: 32.9 % — ABNORMAL LOW (ref 36.0–46.0)
Hemoglobin: 11.3 g/dL — ABNORMAL LOW (ref 12.0–15.0)
MCH: 32.4 pg (ref 26.0–34.0)
MCHC: 34.3 g/dL (ref 30.0–36.0)
MCV: 94.3 fL (ref 80.0–100.0)
Platelets: 205 10*3/uL (ref 150–400)
RBC: 3.49 MIL/uL — ABNORMAL LOW (ref 3.87–5.11)
RDW: 14.3 % (ref 11.5–15.5)
WBC: 6.6 10*3/uL (ref 4.0–10.5)
nRBC: 0 % (ref 0.0–0.2)

## 2019-09-26 MED ORDER — APIXABAN 5 MG PO TABS
5.0000 mg | ORAL_TABLET | Freq: Two times a day (BID) | ORAL | Status: DC
  Administered 2019-09-26 – 2019-09-29 (×7): 5 mg via ORAL
  Filled 2019-09-26 (×7): qty 1

## 2019-09-26 MED ORDER — POTASSIUM CHLORIDE CRYS ER 20 MEQ PO TBCR
40.0000 meq | EXTENDED_RELEASE_TABLET | Freq: Two times a day (BID) | ORAL | Status: AC
  Administered 2019-09-26 (×2): 40 meq via ORAL
  Filled 2019-09-26 (×2): qty 2

## 2019-09-26 NOTE — Progress Notes (Signed)
Orthopedic Tech Progress Note Patient Details:  Gabriela Rice 05-22-1956 [id number]  Ortho Devices Type of Ortho Device: Prafo boot/shoe Ortho Device/Splint Location: Bilateral Prafo boots Ortho Device/Splint Interventions: Application   Post Interventions Patient Tolerated: Well Instructions Provided: Care of device   [name] 09/26/2019, 4:00 PM

## 2019-09-26 NOTE — Progress Notes (Signed)
[place] for heparin Indication: DVT and stroke  Assessment: 62 yof presenting to [facility] finding to have a stroke (received tPA on 12/23@2121 ). Duplex came back with acute DVT bilaterally.   Hgb 12.7, plt 233. Last dose enoxaparin 40 mg on 12/25@1211 . No s/sx of bleeding.  Initial heparin level 0.14 units/ml  Goal of Therapy:  Heparin level 0.3-0.5 units/ml Monitor platelets by anticoagulation protocol: Yes   Plan:  Increase heparin to 1100 units/hr Check heparin level in ~ h ours  Thanks for allowing pharmacy to be a part of this patient's care.  Excell Seltzer, PharmD Clinical Pharmacist  09/26/2019,5:13 AM

## 2019-09-26 NOTE — Progress Notes (Signed)
ANTICOAGULATION CONSULT NOTE - Initial Consult  Pharmacy Consult for heparin Indication: DVT and stroke  Allergies  Allergen Reactions  . Penicillins Other (See Comments)    seizure as child Has patient had a PCN reaction causing immediate rash, facial/tongue/throat swelling, SOB or lightheadedness with hypotension: No Has patient had a PCN reaction causing severe rash involving mucus membranes or skin necrosis:No Has patient had a PCN reaction that required hospitalizationNo Has patient had a PCN reaction occurring within the last 10 years:No If all of the above answers are "NO", then may proceed with Cephalosporin use.    . Codeine Swelling    hands swell    Patient Measurements: Height: 5\' 4"  (162.6 cm) Weight: 206 lb (93.4 kg) IBW/kg (Calculated) : 54.7 Heparin Dosing Weight: 75.9 kg  Vital Signs: Temp: 98.2 F (36.8 C) (12/27 1203) Temp Source: Oral (12/27 1203) BP: 140/82 (12/27 1203) Pulse Rate: 97 (12/27 1203)  Labs: Recent Labs    09/24/19 0315 09/25/19 0327 09/26/19 0407 09/26/19 0932  HGB 13.9 12.7 11.3*  --   HCT 38.5 37.4 32.9*  --   PLT 279 233 205  --   HEPARINUNFRC  --   --  0.14* 0.46  CREATININE  --  0.63 0.56  --     Estimated Creatinine Clearance: 79.8 mL/min (by C-G formula based on SCr of 0.56 mg/dL).   Assessment: 47 yof presenting to [facility] finding to have a stroke (received tPA on 12/23@2121 ). Duplex came back with acute distal  DVT bilaterally.   Has been on < 24 hr heparin  CBC stable   Plan:  Dc heparin apixaban 5 mg BID - discussed plan w Dr Erlinda Hong as would normally load for 1 week, but d/t recent stroke and distal DVT will not give week load  Barth Kirks, PharmD, BCPS, BCCCP Clinical Pharmacist [phone]  Please check AMION for all [place] numbers  09/26/2019 2:16 PM

## 2019-09-26 NOTE — Evaluation (Signed)
Occupational Therapy Evaluation Patient Details Name: Gabriela Rice MRN: [id number] DOB: 20-Mar-1956 Today's Date: 09/26/2019    History of Present Illness This 63 y.o. female admitted with Lt UE weakness and difficulty speaking.  MRI showed Rt frontoparietal infarcts including involvement of the primary sensorimotor gyri.  PMH includes:  debilltating MS, obesity, DM, autoimmune hepatitis, anxiety, HTN   Clinical Impression   Pt admitted with above. She demonstrates the below listed deficits and will benefit from continued OT to maximize safety and independence with BADLs.  Pt presents to OT with Lt UE weakness, bil. LEs weakness, impaired balance, decreased activity tolerance, and ? Cognitive deficits (anticipate baseline).  She currently requires min - total A for ADLs and mod A for bed mobility.  She lives with her spouse.  She has been bedbound/wheelchair bound since ankle fx in 05/2019 and had minimal therapy after that accident and subsequently has developed significant weakness and debility.  Her family has been assisting her with all ADLs and lifting her to a w/c when able.  She has had limited ability to access MD appointments due to limited mobility.  Recommend HHOT and PT, hoyer lift.  Feel she would benefit greatly by a power wheelchair and recommend w/c seating assessment either through HHPT/OT or OPPT/OT if HH unable to perform this assessment. Also recommend bil. PRAFOs.  Discussed post acute rehab with pt, but she really wants to discharge home.  Will follow acutely.     Follow Up Recommendations  Home health OT; HHPT     Equipment Recommendations  Hoyer lift    Recommendations for Other Services       Precautions / Restrictions Precautions Precautions: Fall Restrictions Weight Bearing Restrictions: Yes Other Position/Activity Restrictions: Per chart review, it appears that pt can weight bear through Rt heel with CAM boot in place.  Pt has not been able to leave house for  repeat x trays to have Omer status increased       Mobility Bed Mobility Overal bed mobility: Needs Assistance Bed Mobility: Rolling;Sit to Supine;Supine to Sit Rolling: Mod assist   Supine to sit: Mod assist Sit to supine: Mod assist   General bed mobility comments: assist to move LEs onto and off of bed and assist to lift and lower trunk   Transfers                 General transfer comment: unable to perform     Balance Overall balance assessment: Needs assistance Sitting-balance support: Feet supported;Single extremity supported Sitting balance-Leahy Scale: Poor Sitting balance - Comments: Pt requires mod A to maintain EOB sitting.  She frequently looses balance to the Rt                                    ADL either performed or assessed with clinical judgement   ADL Overall ADL's : Needs assistance/impaired Eating/Feeding: Modified independent;Bed level   Grooming: Wash/dry hands;Wash/dry face;Oral care;Brushing hair;Set up;Bed level   Upper Body Bathing: Moderate assistance;Bed level   Lower Body Bathing: Total assistance;Bed level   Upper Body Dressing : Maximal assistance;Bed level;Sitting   Lower Body Dressing: Total assistance;Bed level   Toilet Transfer: Total assistance Toilet Transfer Details (indicate cue type and reason): unable  Toileting- Clothing Manipulation and Hygiene: Total assistance;Bed level       Functional mobility during ADLs: Moderate assistance(bed mobility only )       Vision  Baseline Vision/History: Wears glasses Wears Glasses: At all times Patient Visual Report: No change from baseline Vision Assessment?: No apparent visual deficits     Perception Perception Perception Tested?: Yes   Praxis Praxis Praxis tested?: Within functional limits    Pertinent Vitals/Pain Pain Assessment: Faces Faces Pain Scale: Hurts little more Pain Location: Lt ankle with PROM  Pain Descriptors / Indicators:  Aching;Grimacing Pain Intervention(s): Monitored during session     Hand Dominance Right   Extremity/Trunk Assessment Upper Extremity Assessment Upper Extremity Assessment: LUE deficits/detail LUE Deficits / Details: Pt demonstrates movement Lt UE in [place] stage 5 - moving out of synergies, and hand in Br[place]strom stage 4  LUE Coordination: decreased fine motor;decreased gross motor   Lower Extremity Assessment Lower Extremity Assessment: Defer to PT evaluation   Cervical / Trunk Assessment Cervical / Trunk Assessment: Other exceptions Cervical / Trunk Exceptions: kyphosis, Lt sided weakness    Communication Communication Communication: No difficulties   Cognition Arousal/Alertness: Awake/alert Behavior During Therapy: WFL for tasks assessed/performed Overall Cognitive Status: No family/caregiver present to determine baseline cognitive functioning                                 General Comments: Pt occasionally gets info mixed up    General Comments  Discussed recommendations for post acute rehab with pt, but she is not interested and wants to return home.  Also discussed recommendation for hoyer lift at home (agreeable), HHOT/PT (agreeable), as well as recommendation for w/c assessment for power wheelchair     Exercises     Shoulder Instructions      Home Living Family/patient expects to be discharged to:: Private residence Living Arrangements: Spouse/significant other Available Help at Discharge: Family;Available 24 hours/day Type of Home: House Home Access: Ramped entrance     Home Layout: One level     Bathroom Shower/Tub: Occupational psychologist: Handicapped height Bathroom Accessibility: Yes How Accessible: Accessible via walker Home Equipment: [place] - 4 wheels;Shower seat;Grab bars - tub/shower   Additional Comments: has an adjustable bed       Prior Functioning/Environment Level of Independence: Needs assistance  Gait /  Transfers Assistance Needed: Pt reports she has not been ambulatory since ankle fx 8/20.  She reports she was unable to move safely with CAM boot on, and has been mostly bedbound since that time and has experienced progressive weakness of bil. LEs (likely from [place]) since that time.  She reports her in laws have recently been lifting her to her w/c so that she can go to MD appointments using a transport company  ADL's / Homemaking Assistance Needed: Pt reports that spouse has been assisting her with bathing, dressing and toileting.  She reports she utilizes diapers at home    Comments: Pt reports she has been unable to turn in the bed without assistance, and spouse has attached a rope to the logs of their home that she can pull on to assist with turning.  She reports she only received a couple of visits of PT after ankle fx, and chart review appears to confirm this         OT Problem List: Decreased strength;Decreased range of motion;Decreased activity tolerance;Impaired balance (sitting and/or standing);Decreased coordination;Decreased cognition;Decreased knowledge of use of DME or AE;Obesity;Impaired UE functional use;Pain      OT Treatment/Interventions: Self-care/ADL training;Neuromuscular education;DME and/or AE instruction;Therapeutic activities;Cognitive remediation/compensation;Visual/perceptual remediation/compensation;Patient/family education;Balance training;Splinting    OT  Goals(Current goals can be found in the care plan section) Acute Rehab OT Goals Patient Stated Goal: to get stronger again and be able to get out of the bed  OT Goal Formulation: With patient Time For Goal Achievement: 10/10/19 Potential to Achieve Goals: Good ADL Goals Pt/caregiver will Perform Home Exercise Program: Increased strength;Increased ROM;Left upper extremity;With written HEP provided;With Supervision Additional ADL Goal #1: spouse will be able to safely assist pt with bed mobility  OT Frequency: Min  2X/week   Barriers to D/C:            Co-evaluation              AM-PAC OT "6 Clicks" Daily Activity     Outcome Measure Help from another person eating meals?: None Help from another person taking care of personal grooming?: A Little Help from another person toileting, which includes using toliet, bedpan, or urinal?: Total Help from another person bathing (including washing, rinsing, drying)?: A Lot Help from another person to put on and taking off regular upper body clothing?: A Lot Help from another person to put on and taking off regular lower body clothing?: Total 6 Click Score: 13   End of Session Nurse Communication: Mobility status  Activity Tolerance: Patient tolerated treatment well Patient left: in bed;with call bell/phone within reach;with bed alarm set;with nursing/sitter in room  OT Visit Diagnosis: Muscle weakness (generalized) (M62.81)                Time: [id number] OT Time Calculation (min): 58 min Charges:  OT General Charges $OT Visit: 1 Visit OT Evaluation $OT Eval Moderate Complexity: 1 Mod OT Treatments $Self Care/Home Management : 8-22 mins $Therapeutic Activity: 23-37 mins  Nilsa Nutting., OTR/L Acute Rehabilitation Services Pager [phone] Office [phone]   Lucille Passy M 09/26/2019, 2:31 PM

## 2019-09-26 NOTE — Progress Notes (Signed)
STROKE TEAM PROGRESS NOTE  Interval history Husband at bedside. Pt no complains. No acute event overnight. LE venous doppler showed b/l tibial DVT. Given her bilateral DVT and bedbound condition as well as cortical stroke, started on heparin IV and will change to eliquis today. UTI on Abx. PT/OT on board, pending TEE.     Vitals:   09/25/19 1650 09/25/19 2334 09/26/19 0321 09/26/19 1203  BP: (!) 145/86 121/74 129/74 140/82  Pulse: (!) 114 (!) 105 (!) 104 97  Resp: 19 20 18 18   Temp: 99.2 F (37.3 C) 97.6 F (36.4 C) 98.5 F (36.9 C) 98.2 F (36.8 C)  TempSrc: Oral Oral Oral Oral  SpO2: 94% 94% 94% 94%  Weight:      Height:        CBC:  Recent Labs  Lab 09/22/19 1948 09/25/19 0327 09/26/19 0407  WBC 5.7 7.4 6.6  NEUTROABS 3.7  --   --   HGB 13.3 12.7 11.3*  HCT 39.1 37.4 32.9*  MCV 97.8 96.4 94.3  PLT 275 233 [id number]    Basic Metabolic Panel:  Recent Labs  Lab 09/25/19 0327 09/26/19 0407  NA 142 142  K 3.2* 3.0*  CL 102 107  CO2 27 25  GLUCOSE 124* 141*  BUN 13 8  CREATININE 0.63 0.56  CALCIUM 9.1 8.9   Lipid Panel:     Component Value Date/Time   CHOL 200 09/24/2019 0315   TRIG 114 09/24/2019 0315   HDL 52 09/24/2019 0315   CHOLHDL 3.8 09/24/2019 0315   VLDL 23 09/24/2019 0315   LDLCALC 125 (H) 09/24/2019 0315   HgbA1c:  Lab Results  Component Value Date   HGBA1C 6.7 (H) 09/24/2019   Urine Drug Screen: No results found for: LABOPIA, COCAINSCRNUR, LABBENZ, AMPHETMU, THCU, LABBARB  Alcohol Level     Component Value Date/Time   ETH <10 09/22/2019 1948    IMAGING DG Ankle Complete Right  Result Date: 09/26/2019 CLINICAL DATA:  RIGHT ankle fracture, follow-up EXAM: RIGHT ANKLE - COMPLETE 3+ VIEW COMPARISON:  05/04/2019 FINDINGS: Osseous demineralization. Joint spaces preserved. Scattered soft tissue swelling. Subacute fracture of the distal fibula with fracture line remaining visible. Mild demineralization at the medial malleolus but no definite  fracture plane is seen. No additional fracture, dislocation or bone destruction. IMPRESSION: Subacute fracture of the distal RIGHT fibula with fracture line remaining evident indicating incomplete healing since acute fracture in August 2020. Electronically Signed   By: Lavonia Dana M.D.   On: 09/26/2019 14:39   VAS [place] LOWER EXTREMITY VENOUS (DVT)  Result Date: 09/26/2019  Lower Venous Study Indications: Pain, Swelling, and stroke.  Limitations: Body habitus and edema. Comparison Study: Prior negative study from 08/2015 is available for comparison Performing Technologist: Sharion Dove RVS  Examination Guidelines: A complete evaluation includes B-mode imaging, spectral Doppler, color Doppler, and power Doppler as needed of all accessible portions of each vessel. Bilateral testing is considered an integral part of a complete examination. Limited examinations for reoccurring indications may be performed as noted.  +---------+---------------+---------+-----------+----------+--------------+ RIGHT    CompressibilityPhasicitySpontaneityPropertiesThrombus Aging +---------+---------------+---------+-----------+----------+--------------+ CFV      Full           Yes      Yes                                 +---------+---------------+---------+-----------+----------+--------------+ SFJ      Full                                                        +---------+---------------+---------+-----------+----------+--------------+  FV Prox  Full                                                        +---------+---------------+---------+-----------+----------+--------------+ FV Mid   Full                                                        +---------+---------------+---------+-----------+----------+--------------+ FV DistalFull                                                        +---------+---------------+---------+-----------+----------+--------------+ PFV      Full                                                         +---------+---------------+---------+-----------+----------+--------------+ POP      Full           Yes      Yes                                 +---------+---------------+---------+-----------+----------+--------------+ PTV      None                                         Acute          +---------+---------------+---------+-----------+----------+--------------+ PERO                                                  Not visualized +---------+---------------+---------+-----------+----------+--------------+   +---------+---------------+---------+-----------+----------+--------------+ LEFT     CompressibilityPhasicitySpontaneityPropertiesThrombus Aging +---------+---------------+---------+-----------+----------+--------------+ CFV      Full           Yes      Yes                                 +---------+---------------+---------+-----------+----------+--------------+ SFJ      Full                                                        +---------+---------------+---------+-----------+----------+--------------+ FV Prox  Full                                                        +---------+---------------+---------+-----------+----------+--------------+  FV Mid   Full                                                        +---------+---------------+---------+-----------+----------+--------------+ FV DistalFull                                                        +---------+---------------+---------+-----------+----------+--------------+ PFV      Full                                                        +---------+---------------+---------+-----------+----------+--------------+ POP      Full           Yes      Yes                                 +---------+---------------+---------+-----------+----------+--------------+ PTV      None                                         Acute           +---------+---------------+---------+-----------+----------+--------------+ PERO                                                  Not visualized +---------+---------------+---------+-----------+----------+--------------+     Summary: Right: Findings consistent with acute deep vein thrombosis involving the right posterior tibial veins. Left: Findings consistent with acute deep vein thrombosis involving the left posterior tibial veins.  *See table(s) above for measurements and observations. Electronically signed by Monica Martinez MD on 09/26/2019 at 1:44:37 PM.    Final     PHYSICAL EXAM Gabriela Rice sitting up comfortably in bed.  Not in distress. . Afebrile. Head is nontraumatic. Neck is supple without bruit.    Cardiac exam no murmur or gallop. Lungs are clear to auscultation. Distal pulses are well felt.  Neurological Exam :  She is awake alert oriented to time place and person.  There is no dysarthria or aphasia or apraxia.  Extraocular movements are full range without nystagmus.  She blinks to threat bilaterally.  Fundi were not visualized.  Face is symmetric without weakness.  Tongue midline.  Motor system exam shows left pronator drift with mild weakness of intrinsic hand muscles and grip on the left. 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  She is able to wiggle her toes in both feet, right DF 3/5 and left DF 2/5.  Deep tendon reflexes are brisker in the lower extremities and upper extremities.  Tone is increased in the lower extremities.  No clonus is noted.  Touch pinprick sensation is preserved bilaterally.  Vibration sense  is diminished in both lower extremities from ankle down.  Plantars are both upgoing.  Gait not tested.    ASSESSMENT/PLAN Gabriela Rice is a 63 y.o. female with history of debilitating MS, diabetes, autoimmune hepatitis, hypertension, hyperlipidemia, anxiety,  wheelchair-bound secondary to [place], obesity and fractured ankle in Aug 1010, presenting to [facility] with L arm weakness and slurred speech acutely 12/23 at 1655. Received tPA @ [facility] at 12/23 at 2121. Transferred to [facility]/24.    Stroke:   R frontoparietal infarcts s/p tPA, embolic secondary to unknown source - concerning for paradoxical emboli given b/l DVT  CT head No acute abnormality. Small vessel disease. ASPECTS 10.     CTA head & neck Unremarkable   MRI  R frontoparietal infarcts. Abnormal T2 signal, hx MS. ? L parietal infarct vs demyelinating dz  2D o Echo EF 65-70%. No source of embolus   LE dopplers b/l posterior tibial DVT  TCD bubble study pending to evaluate PFO  LDL 125  HgbA1c 6.7  SCDs for VTE prophylaxis changed to Lovenox 40 mg sq daily    aspirin 81 mg daily prior to admission, now heparin IV transitioned to eliquis  Therapy recommendations:  Home PT   Disposition:  pending   LE DVT  LE venous doppler showed b/l posterior tibial DVT  Although distal DVT, given her bed-bound condition, AC is indicated and likely to be life long  Started on heparin IV per stroke protocol  Now transitioned to eliquis  Pending TCD bubble study to evaluate PFO  MS   Has been bed bound for 3 months  Follow[facility] by [facility]. Jannifer Franklin at [facility]  Continue home PT/OT  On Copaxone  Continue to follow up with Dr. Jannifer Franklin as outpt  UTI with urinary retention  UA WBC > 50  Urine culture Klebsiella, sensitivities pending  IV Rocephin started 12/26  S/p foley catheter  Hypertension  Home meds:  Cozaar 100  Stable . Resume cozaar . Long-term BP goal normotensive  Hyperlipidemia  Home meds:  pravachol 40  LDL 125, goal < 70  Now on lipitor 40  Continue statin at discharge  Diabetes type II Controlled  Home meds:  glucophage 500  HgbA1c 6.7, goal < 7.0  CBGs ac&hx  SSI  wc  Other Stroke Risk Factors  Former Cigarette smoker, quit 23  yrs ago  Obesity, Body mass index is 35.36 kg/m., recommend weight loss, diet and exercise as appropriate   Migraines  Other Active Problems  Broke ankle in Aug 2020 and has not been able to stand since that time   Autoimmune hepatitis on imuran  Hypokalemia - 3.2->3.0 - supplement  Hospital day #4  Rosalin Hawking, MD PhD Stroke Neurology 09/26/2019 5:54 PM   To contact Stroke Continuity provider, please refer to [email or web address] After hours, contact General Neurology

## 2019-09-27 ENCOUNTER — Inpatient Hospital Stay (HOSPITAL_COMMUNITY): Payer: PPO

## 2019-09-27 DIAGNOSIS — I639 Cerebral infarction, unspecified: Secondary | ICD-10-CM

## 2019-09-27 DIAGNOSIS — S82891S Other fracture of right lower leg, sequela: Secondary | ICD-10-CM

## 2019-09-27 DIAGNOSIS — I10 Essential (primary) hypertension: Secondary | ICD-10-CM

## 2019-09-27 DIAGNOSIS — E7800 Pure hypercholesterolemia, unspecified: Secondary | ICD-10-CM

## 2019-09-27 LAB — BASIC METABOLIC PANEL
Anion gap: 10 (ref 5–15)
BUN: 7 mg/dL — ABNORMAL LOW (ref 8–23)
CO2: 26 mmol/L (ref 22–32)
Calcium: 8.8 mg/dL — ABNORMAL LOW (ref 8.9–10.3)
Chloride: 106 mmol/L (ref 98–111)
Creatinine, Ser: 0.53 mg/dL (ref 0.44–1.00)
GFR calc Af Amer: >60 mL/min (ref >60)
GFR calc non Af Amer: >60 mL/min (ref >60)
Glucose, Bld: 156 mg/dL — ABNORMAL HIGH (ref 70–99)
Potassium: 3.7 mmol/L (ref 3.5–5.1)
Sodium: 142 mmol/L (ref 135–145)

## 2019-09-27 LAB — GLUCOSE, CAPILLARY
Glucose-Capillary: 130 mg/dL — ABNORMAL HIGH (ref 70–99)
Glucose-Capillary: 145 mg/dL — ABNORMAL HIGH (ref 70–99)
Glucose-Capillary: 155 mg/dL — ABNORMAL HIGH (ref 70–99)
Glucose-Capillary: 157 mg/dL — ABNORMAL HIGH (ref 70–99)
Glucose-Capillary: 193 mg/dL — ABNORMAL HIGH (ref 70–99)
Glucose-Capillary: 98 mg/dL (ref 70–99)

## 2019-09-27 LAB — CBC
HCT: 32.4 % — ABNORMAL LOW (ref 36.0–46.0)
Hemoglobin: 11.3 g/dL — ABNORMAL LOW (ref 12.0–15.0)
MCH: 33 pg (ref 26.0–34.0)
MCHC: 34.9 g/dL (ref 30.0–36.0)
MCV: 94.7 fL (ref 80.0–100.0)
Platelets: 215 10*3/uL (ref 150–400)
RBC: 3.42 MIL/uL — ABNORMAL LOW (ref 3.87–5.11)
RDW: 14.5 % (ref 11.5–15.5)
WBC: 4.4 10*3/uL (ref 4.0–10.5)
nRBC: 0 % (ref 0.0–0.2)

## 2019-09-27 LAB — URINE CULTURE: Culture: >=100000 — AB

## 2019-09-27 NOTE — Progress Notes (Signed)
Orthopedic Tech Progress Note Patient Details:  Gabriela Rice Jul 04, 1956 [id number] Went to service patient with CAM WALKING BOOT. Patient stated that she was not able to used the same type of boot at [facility] do to [place]. All she was able to do was drag the foot/leg. I told her I would let the RN know as well as therapy which I did. Patient ID: Gabriela Rice, female   DOB: 07/31/1956, 63 y.o.   MRN: [id number]   Janit Pagan 09/27/2019, 4:10 PM

## 2019-09-27 NOTE — Progress Notes (Signed)
STROKE TEAM PROGRESS NOTE   Interval history Pt was seen during TCD bubble test. Pt has no complains. No acute event overnight. She was started on eliquis for DVT treatment. UTI treated and Urine culture sensitive to rocephin. Will remove foley catheter today to see whether urinary retention resolved. TCD bubble study showed small PFO.   Vitals:   09/27/19 0006 09/27/19 0411 09/27/19 0800 09/27/19 1134  BP: 123/77 (!) 150/79 (!) 147/85 (!) (P) 137/92  Pulse: 92 99 98 (!) (P) 101  Resp: 16 15 18  (P) 18  Temp: 98.5 F (36.9 C) 98 F (36.7 C) 98.5 F (36.9 C) (P) 98.2 F (36.8 C)  TempSrc: Oral Oral Oral (P) Oral  SpO2: 92% 93% 93% (P) 94%  Weight:      Height:        CBC:  Recent Labs  Lab 09/22/19 1948 09/26/19 0407 09/27/19 0718  WBC 5.7 6.6 4.4  NEUTROABS 3.7  --   --   HGB 13.3 11.3* 11.3*  HCT 39.1 32.9* 32.4*  MCV 97.8 94.3 94.7  PLT 275 205 [id number]    Basic Metabolic Panel:  Recent Labs  Lab 09/26/19 0407 09/27/19 0718  NA 142 142  K 3.0* 3.7  CL 107 106  CO2 25 26  GLUCOSE 141* 156*  BUN 8 7*  CREATININE 0.56 0.53  CALCIUM 8.9 8.8*   Lipid Panel:     Component Value Date/Time   CHOL 200 09/24/2019 0315   TRIG 114 09/24/2019 0315   HDL 52 09/24/2019 0315   CHOLHDL 3.8 09/24/2019 0315   VLDL 23 09/24/2019 0315   LDLCALC 125 (H) 09/24/2019 0315   HgbA1c:  Lab Results  Component Value Date   HGBA1C 6.7 (H) 09/24/2019   Urine Drug Screen: No results found for: LABOPIA, COCAINSCRNUR, LABBENZ, AMPHETMU, THCU, LABBARB  Alcohol Level     Component Value Date/Time   ETH <10 09/22/2019 1948    IMAGING DG Ankle Complete Right  Result Date: 09/26/2019 CLINICAL DATA:  RIGHT ankle fracture, follow-up EXAM: RIGHT ANKLE - COMPLETE 3+ VIEW COMPARISON:  05/04/2019 FINDINGS: Osseous demineralization. Joint spaces preserved. Scattered soft tissue swelling. Subacute fracture of the distal fibula with fracture line remaining visible. Mild demineralization at the  medial malleolus but no definite fracture plane is seen. No additional fracture, dislocation or bone destruction. IMPRESSION: Subacute fracture of the distal RIGHT fibula with fracture line remaining evident indicating incomplete healing since acute fracture in August 2020. Electronically Signed   By: Lavonia Dana M.D.   On: 09/26/2019 14:39   VAS [place] LOWER EXTREMITY VENOUS (DVT)  Result Date: 09/26/2019  Lower Venous Study Indications: Pain, Swelling, and stroke.  Limitations: Body habitus and edema. Comparison Study: Prior negative study from 08/2015 is available for comparison Performing Technologist: Sharion Dove RVS  Examination Guidelines: A complete evaluation includes B-mode imaging, spectral Doppler, color Doppler, and power Doppler as needed of all accessible portions of each vessel. Bilateral testing is considered an integral part of a complete examination. Limited examinations for reoccurring indications may be performed as noted.  +---------+---------------+---------+-----------+----------+--------------+ RIGHT    CompressibilityPhasicitySpontaneityPropertiesThrombus Aging +---------+---------------+---------+-----------+----------+--------------+ CFV      Full           Yes      Yes                                 +---------+---------------+---------+-----------+----------+--------------+ SFJ      Full                                                        +---------+---------------+---------+-----------+----------+--------------+  FV Prox  Full                                                        +---------+---------------+---------+-----------+----------+--------------+ FV Mid   Full                                                        +---------+---------------+---------+-----------+----------+--------------+ FV DistalFull                                                         +---------+---------------+---------+-----------+----------+--------------+ PFV      Full                                                        +---------+---------------+---------+-----------+----------+--------------+ POP      Full           Yes      Yes                                 +---------+---------------+---------+-----------+----------+--------------+ PTV      None                                         Acute          +---------+---------------+---------+-----------+----------+--------------+ PERO                                                  Not visualized +---------+---------------+---------+-----------+----------+--------------+   +---------+---------------+---------+-----------+----------+--------------+ LEFT     CompressibilityPhasicitySpontaneityPropertiesThrombus Aging +---------+---------------+---------+-----------+----------+--------------+ CFV      Full           Yes      Yes                                 +---------+---------------+---------+-----------+----------+--------------+ SFJ      Full                                                        +---------+---------------+---------+-----------+----------+--------------+ FV Prox  Full                                                        +---------+---------------+---------+-----------+----------+--------------+  FV Mid   Full                                                        +---------+---------------+---------+-----------+----------+--------------+ FV DistalFull                                                        +---------+---------------+---------+-----------+----------+--------------+ PFV      Full                                                        +---------+---------------+---------+-----------+----------+--------------+ POP      Full           Yes      Yes                                  +---------+---------------+---------+-----------+----------+--------------+ PTV      None                                         Acute          +---------+---------------+---------+-----------+----------+--------------+ PERO                                                  Not visualized +---------+---------------+---------+-----------+----------+--------------+     Summary: Right: Findings consistent with acute deep vein thrombosis involving the right posterior tibial veins. Left: Findings consistent with acute deep vein thrombosis involving the left posterior tibial veins.  *See table(s) above for measurements and observations. Electronically signed by Monica Martinez MD on 09/26/2019 at 1:44:37 PM.    Final     PHYSICAL EXAM   Pleasant middle-aged Caucasian lady sitting up comfortably in bed.  Not in distress. . Afebrile. Head is nontraumatic. Neck is supple without bruit.    Cardiac exam no murmur or gallop. Lungs are clear to auscultation. Distal pulses are well felt.  Neurological Exam :  She is awake alert oriented to time place and person.  There is no dysarthria or aphasia or apraxia.  Extraocular movements are full range without nystagmus.  She blinks to threat bilaterally.  Fundi were not visualized.  Face is symmetric without weakness.  Tongue midline.  Motor system exam shows left pronator drift with mild weakness of intrinsic hand muscles and grip on the left. 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  She is able to wiggle her toes in both feet, right DF 3/5 and left DF 2/5.  Deep tendon reflexes are brisker in the lower extremities and upper extremities.  Tone is increased in the lower extremities.  No clonus is noted.  Touch pinprick sensation is preserved bilaterally.  Vibration sense is diminished in both lower extremities from ankle down.  Plantars are both upgoing.  Gait not tested.    ASSESSMENT/PLAN Ms.  Gabriela Rice is a 63 y.o. female with history of debilitating MS, diabetes, autoimmune hepatitis, hypertension, hyperlipidemia, anxiety, wheelchair-bound secondary to [place], obesity and fractured ankle in Aug 1010, presenting to [facility] with L arm weakness and slurred speech acutely 12/23 at 1655. Received tPA @ [facility] at 12/23 at 2121. Transferred to [facility]/24.    Stroke:   R frontoparietal infarcts s/p tPA, embolic secondary to unknown source - could be due to paradoxical emboli given b/l DVT and small PFO  CT head No acute abnormality. Small vessel disease. ASPECTS 10.     CTA head & neck Unremarkable   MRI  R frontoparietal infarcts. Abnormal T2 signal, hx MS. ? L parietal infarct vs demyelinating dz  2D o Echo EF 65-70%. No source of embolus   LE dopplers b/l posterior tibial DVT  TCD bubble study showed small PFO (spencer degree II)   LDL 125  HgbA1c 6.7  SCDs for VTE prophylaxis changed to Lovenox 40 mg sq daily    aspirin 81 mg daily prior to admission, heparin IV transitioned to eliquis  Therapy recommendations:  HH PT, HH OT   Disposition:  pending   LE DVT  LE venous doppler showed b/l posterior tibial DVT  Although distal DVT, given her bed-bound condition, AC is indicated and likely to be life long  Started on heparin IV per stroke protocol  Now transitioned to eliquis  TCD bubble study showed small PFO   MS   Has been bed bound for 3 months  Follow[facility] Jann[facility]  Continue home PT/OT  On Copaxone  Continue to follow up with Dr. Jannifer Franklin as outpt  UTI with urinary retention  UA WBC > 50  Urine culture Klebsiella, sensitive to ceftriaxone     IV Rocephin started 12/26  S/p foley catheter  D/c foley - check bladdre scan - I&O cath  Hypertension  Home meds:  Cozaar 100  Stable . Resume cozaar . Long-term BP goal normotensive  Hyperlipidemia  Home meds:  pravachol 40  LDL 125, goal < 70  Now on  lipitor 40  Continue statin at discharge  Diabetes type II Controlled  Home meds:  glucophage 500  HgbA1c 6.7, goal < 7.0  CBGs ac&hx  SSI  wc  Other Stroke Risk Factors  Former Cigarette smoker, quit 23 yrs ago  Obesity, Body mass index is 35.36 kg/m., recommend weight loss, diet and exercise as appropriate   Migraines  Other Active Problems  Broke ankle in Aug 2020 and has not been able to stand since that time. Repeat imaging shows fracture has not healed. Spoke w/ ortho - recommends Sam [facility]ford Rile book which will allow pt to weight bear RLE (ordered) - will need OP f/u by Dr. Aline Brochure in [place] who initially treated pt. Likely needs plated.   Autoimmune hepatitis on imuran  Hypokalemia, resolved - 3.2->3.0 - supplement - 3.7   Hospital day #5  Rosalin Hawking, MD PhD Stroke Neurology 09/27/2019 2:46 PM   To contact Stroke Continuity provider, please refer to [email or web address] After hours, contact General Neurology

## 2019-09-27 NOTE — Progress Notes (Signed)
Occupational Therapy Treatment Patient Details Name: RANIYAH AXFORD MRN: [id number] DOB: Aug 08, 1956 Today's Date: 09/27/2019    History of present illness This 63 y.o. female admitted with Lt UE weakness and difficulty speaking.  MRI showed Rt frontoparietal infarcts including involvement of the primary sensorimotor gyri.  PMH includes:  debilltating MS, obesity, DM, autoimmune hepatitis, anxiety, HTN. Doppler showed B tibial DVTs.   OT comments  Pt instructed in initial HEP of Lt UE with focus on good alignment of shoulder and control of shoulder and elbow during reach.  She fatigues quickly.   Follow Up Recommendations  Home health OT;Other (comment)    Equipment Recommendations  Hospital bed;Other (comment)(hoyer lift and power w/c )    Recommendations for Other Services      Precautions / Restrictions Precautions Precautions: Fall       Mobility Bed Mobility                  Transfers                      Balance                                           ADL either performed or assessed with clinical judgement   ADL                                               Vision       Perception     Praxis      Cognition Arousal/Alertness: Awake/alert Behavior During Therapy: WFL for tasks assessed/performed Overall Cognitive Status: No family/caregiver present to determine baseline cognitive functioning                                          Exercises Exercises: Other exercises Other Exercises Other Exercises: Pt instructed in initial HEP with focus on shoulder flexion with elbow straight and neutral rotation of shoulder.  She performed x 8 with active holds, then worked on active reach with Lt UE, but she fatigued after 3 reps.  Instructed her to continue these activities/exercises through the pm, and she verbalized understanding    Shoulder Instructions       General Comments       Pertinent Vitals/ Pain       Pain Assessment: Faces Pain Score: 4  Pain Location: Lt ankle with PROM (this is not the ankle with the known fx, pt wondering if she hurt L ankle at same time. Has not been evaluated) Pain Descriptors / Indicators: Aching;Grimacing Pain Intervention(s): Monitored during session  Home Living                                          Prior Functioning/Environment              Frequency  Min 2X/week        Progress Toward Goals  OT Goals(current goals can now be found in the care plan section)  Progress towards OT goals: Progressing toward goals  Plan Equipment recommendations need to be updated    Co-evaluation                 AM-PAC OT "6 Clicks" Daily Activity     Outcome Measure   Help from another person eating meals?: None Help from another person taking care of personal grooming?: A Little Help from another person toileting, which includes using toliet, bedpan, or urinal?: Total Help from another person bathing (including washing, rinsing, drying)?: A Lot Help from another person to put on and taking off regular upper body clothing?: A Lot Help from another person to put on and taking off regular lower body clothing?: Total 6 Click Score: 13    End of Session    OT Visit Diagnosis: Muscle weakness (generalized) (M62.81)   Activity Tolerance Patient tolerated treatment well   Patient Left in bed;with call bell/phone within reach   Nurse Communication Mobility status        Time: [id number] OT Time Calculation (min): 10 min  Charges: OT General Charges $OT Visit: 1 Visit OT Treatments $Neuromuscular Re-education: 8-22 mins  Nilsa Nutting OTR/L Acute Rehabilitation Services Pager [phone] Office [phone]    Lucille Passy M 09/27/2019, 6:24 PM

## 2019-09-27 NOTE — Evaluation (Signed)
Physical Therapy Evaluation Patient Details Name: Gabriela Rice MRN: [id number] DOB: 03-17-1956 Today's Date: 09/27/2019   History of Present Illness  This 63 y.o. female admitted with Lt UE weakness and difficulty speaking.  MRI showed Rt frontoparietal infarcts including involvement of the primary sensorimotor gyri.  PMH includes:  debilltating MS, obesity, DM, autoimmune hepatitis, anxiety, HTN. Doppler showed B tibial DVTs.  Clinical Impression  Pt admitted with above diagnosis. Pt presents with profound weakness BLE's as she has been in bed since August when she fractured her R ankle. Of note, she has had L ankle pain (to touch and with [facility]) and reports she may have injured it at the same time and it has not been evaluated. Requires max A for mobility. Maintained sitting EOB >20 mins working on sitting balance and trunk control. Would really benefit from a hospital bed, hoyer lift, and power chair at home. Pt currently with functional limitations due to the deficits listed below (see PT Problem List). Pt will benefit from skilled PT to increase their independence and safety with mobility to allow discharge to the venue listed below.       Follow Up Recommendations Home health PT    Equipment Recommendations  Hospital bed;Other (comment)(hoyer lift, power chair)    Recommendations for Other Services       Precautions / Restrictions Precautions Precautions: Fall Restrictions Weight Bearing Restrictions: Yes RLE Weight Bearing: Partial weight bearing RLE Partial Weight Bearing Percentage or Pounds: through heel in CAM boot Other Position/Activity Restrictions: x-rays showed incomplete healing of RLE, continue with current WB status      Mobility  Bed Mobility Overal bed mobility: Needs Assistance Bed Mobility: Rolling;Sit to Supine;Supine to Sit Rolling: Mod assist   Supine to sit: Mod assist Sit to supine: Max assist   General bed mobility comments: assist to move LEs  onto and off of bed and assist to lift and lower trunk   Transfers                 General transfer comment: unable to perform, will need to be lifted  Ambulation/Gait                Stairs            Wheelchair Mobility    Modified Rankin (Stroke Patients Only) Modified Rankin (Stroke Patients Only) Pre-Morbid Rankin Score: Moderately severe disability Modified Rankin: Moderately severe disability     Balance Overall balance assessment: Needs assistance Sitting-balance support: Feet supported;Single extremity supported Sitting balance-Leahy Scale: Poor Sitting balance - Comments: Pt requires mod A to maintain EOB sitting, posterior lean. Worked on reaching in multiple planes in sitting to activate trunk. Maintained sitting 20 mins  Postural control: Posterior lean                                   Pertinent Vitals/Pain Pain Assessment: Faces Faces Pain Scale: Hurts even more Pain Location: Lt ankle with PROM (this is not the ankle with the known fx, pt wondering if she hurt L ankle at same time. Has not been evaluated) Pain Descriptors / Indicators: Aching;Grimacing Pain Intervention(s): Limited activity within patient's tolerance;Monitored during session    [place] expects to be discharged to:: Private residence Living Arrangements: Spouse/significant other Available Help at Discharge: Family;Available 24 hours/day Type of Home: House Home Access: Ramped entrance     Home Layout: One level Home Equipment:  Walker - 4 wheels;Shower seat;Grab bars - tub/shower Additional Comments: has an adjustable bed (not a hospital bed)    Prior Function Level of Independence: Needs assistance   Gait / Transfers Assistance Needed: Pt reports she has not been ambulatory since ankle fx 8/20.  She reports she was unable to move safely with CAM boot on, and has been mostly bedbound since that time and has experienced progressive  weakness of bil. LEs (likely from [place]) since that time.  She reports her in laws have recently been lifting her to her w/c so that she can go to MD appointments using a transport company   ADL's / Homemaking Assistance Needed: Pt reports that spouse has been assisting her with bathing, dressing and toileting.  She reports she utilizes diapers at home   Comments: Pt reports she has been unable to turn in the bed without assistance, and spouse has attached a rope to the logs of their home that she can pull on to assist with turning.  She reports she only received a couple of visits of PT after ankle fx, and chart review appears to confirm this      Hand Dominance   Dominant Hand: Right    Extremity/Trunk Assessment   Upper Extremity Assessment Upper Extremity Assessment: Defer to OT evaluation    Lower Extremity Assessment Lower Extremity Assessment: Generalized weakness;RLE deficits/detail;LLE deficits/detail RLE Deficits / Details: knee ext 2-/5, hip flex 1/5 RLE Sensation: decreased light touch RLE Coordination: decreased gross motor LLE Deficits / Details: hip flex 1/5, knee ext 1/5 LLE Sensation: decreased light touch LLE Coordination: decreased gross motor    Cervical / Trunk Assessment Cervical / Trunk Assessment: Other exceptions Cervical / Trunk Exceptions: kyphosis, Lt sided weakness   Communication   Communication: No difficulties  Cognition Arousal/Alertness: Awake/alert Behavior During Therapy: WFL for tasks assessed/performed Overall Cognitive Status: No family/caregiver present to determine baseline cognitive functioning                                 General Comments: Pt occasionally gets info mixed up       General Comments General comments (skin integrity, edema, etc.): worked on shoulder shrugs and scapular retraction in sitting    Exercises     Assessment/Plan    PT Assessment Patient needs continued PT services  PT Problem List  Decreased strength;Decreased range of motion;Decreased activity tolerance;Decreased balance;Decreased mobility;Decreased coordination;Decreased cognition;Decreased knowledge of use of DME;Pain;Obesity       PT Treatment Interventions DME instruction;Functional mobility training;Therapeutic activities;Therapeutic exercise;Balance training;Neuromuscular re-education;Cognitive remediation;Patient/family education    PT Goals (Current goals can be found in the Care Plan section)  Acute Rehab PT Goals Patient Stated Goal: to get stronger again and be able to get out of the bed  PT Goal Formulation: With patient/family Time For Goal Achievement: 10/11/19 Potential to Achieve Goals: Fair    Frequency Min 3X/week   Barriers to discharge   needs more supportive equipment    Co-evaluation               AM-PAC PT "6 Clicks" Mobility  Outcome Measure Help needed turning from your back to your side while in a flat bed without using bedrails?: A Lot Help needed moving from lying on your back to sitting on the side of a flat bed without using bedrails?: A Lot Help needed moving to and from a bed to a chair (including a wheelchair)?:  Total Help needed standing up from a chair using your arms (e.g., wheelchair or bedside chair)?: Total Help needed to walk in hospital room?: Total Help needed climbing 3-5 steps with a railing? : Total 6 Click Score: 8    End of Session   Activity Tolerance: Patient limited by fatigue Patient left: in bed;with call bell/phone within reach;with family/visitor present Nurse Communication: Mobility status PT Visit Diagnosis: Muscle weakness (generalized) (M62.81);Pain Pain - Right/Left: Left Pain - part of body: Ankle and joints of foot    Time: 1213-1249 PT Time Calculation (min) (ACUTE ONLY): 36 min   Charges:   PT Evaluation $PT Eval Moderate Complexity: 1 Mod PT Treatments $Therapeutic Activity: 8-22 mins        Leighton Roach, PT  Acute  Rehab Services  Pager [phone] Office [place] 09/27/2019, 2:32 PM

## 2019-09-27 NOTE — Discharge Summary (Addendum)
Stroke Discharge Summary  Patient ID: Gabriela Rice   MRN: [id number]      DOB: 1956-04-06  Date of Admission: 09/22/2019 Date of Discharge: 09/29/2019  Attending Physician:  Rosalin Hawking, MD, Stroke MD Consultant(s):    None  Patient's PCP:  Jonathon Jordan, MD  DISCHARGE DIAGNOSIS:  Principal Problem:   Acute CVA (cerebrovascular accident) [facility]) s/p tPA, embolic, unknown source Active Problems:   MS (multiple sclerosis) ([place])   Hyperlipidemia   Obesity   Autoimmune hepatitis ([place])   Diabetes mellitus type 2, controlled ([place])   Essential hypertension   Acute lower UTI   Hypokalemia   Acute deep vein thrombosis (DVT) of tibial vein of both lower extremities ([place])   Acute urinary retention   Ankle fracture, right  Allergies as of 09/29/2019      Reactions   Penicillins Other (See Comments)   seizure as child Has patient had a PCN reaction causing immediate rash, facial/tongue/throat swelling, SOB or lightheadedness with hypotension: No Has patient had a PCN reaction causing severe rash involving mucus membranes or skin necrosis:No Has patient had a PCN reaction that required hospitalizationNo Has patient had a PCN reaction occurring within the last 10 years:No If all of the above answers are "NO", then may proceed with Cephalosporin use.   Codeine Swelling   hands swell      Medication List    STOP taking these medications   aspirin EC 81 MG tablet   ibuprofen 200 MG tablet Commonly known as: ADVIL     TAKE these medications   ALPRAZolam 1 MG tablet Commonly known as: XANAX TAKE 1 TABLET BY MOUTH AT BEDTIME AS NEEDED What changed: reasons to take this   apixaban 5 MG Tabs tablet Commonly known as: Eliquis Take 1 tablet (5 mg total) by mouth 2 (two) times daily.   azaTHIOprine 50 MG tablet Commonly known as: IMURAN TAKE 3 TABLETS BY MOUTH EVERY DAY AT THE SAME TIME;  Patient needs office visit for further refills What changed:   how much to  take  how to take this  when to take this  additional instructions   baclofen 10 MG tablet Commonly known as: LIORESAL One tablet in the morning and evening and 1.5 tablets at midday What changed:   how much to take  how to take this  when to take this  additional instructions   cetirizine 10 MG tablet Commonly known as: ZYRTEC Take 10 mg by mouth daily.   Copaxone 40 MG/ML Sosy Generic drug: Glatiramer Acetate INJECT 40 MG (1 ML) UNDER THE SKIN THREE TIMES A WEEK. What changed:   how much to take  how to take this  when to take this  additional instructions   DULoxetine 30 MG capsule Commonly known as: CYMBALTA Take 90 mg by mouth daily.   gabapentin 300 MG capsule Commonly known as: NEURONTIN TAKE 1 CAPSULE BY MOUTH THREE TIMES A DAY What changed: See the new instructions.   losartan 100 MG tablet Commonly known as: COZAAR Take 100 mg by mouth daily.   metFORMIN 500 MG tablet Commonly known as: GLUCOPHAGE Take 500 mg by mouth 2 (two) times daily with a meal.   polyethylene glycol powder 17 GM/SCOOP powder Commonly known as: GLYCOLAX/MIRALAX Take 17 g by mouth daily as needed for mild constipation. What changed: See the new instructions.   pravastatin 40 MG tablet Commonly known as: PRAVACHOL Take 40 mg by mouth daily.   traMADol 50  MG tablet Commonly known as: ULTRAM TAKE 1 TABLET BY MOUTH THREE TIMES DAILY AS NEEDED What changed: reasons to take this   Vitamin D 50 MCG (2000 UT) Caps Take 2,000 Units by mouth daily.            Durable Medical Equipment  (From admission, onward)         Start     Ordered   09/28/19 1153  For home use only DME Hospital bed  Once    Question Answer Comment  Length of Need Lifetime   The above medical condition requires: Patient requires the ability to reposition frequently   Bed type Semi-electric   Hoyer Lift Yes      09/28/19 1155          LABORATORY STUDIES CBC    Component Value  Date/Time   WBC 5.3 09/29/2019 1101   RBC 3.66 (L) 09/29/2019 1101   HGB 12.1 09/29/2019 1101   HGB 12.3 03/16/2019 1133   HCT 34.5 (L) 09/29/2019 1101   HCT 35.1 03/16/2019 1133   PLT 216 09/29/2019 1101   PLT 225 03/16/2019 1133   MCV 94.3 09/29/2019 1101   MCV 92 03/16/2019 1133   MCH 33.1 09/29/2019 1101   MCHC 35.1 09/29/2019 1101   RDW 14.5 09/29/2019 1101   RDW 14.4 03/16/2019 1133   LYMPHSABS 1.2 09/22/2019 1948   LYMPHSABS 1.0 03/16/2019 1133   MONOABS 0.6 09/22/2019 1948   EOSABS 0.2 09/22/2019 1948   EOSABS 0.1 03/16/2019 1133   BASOSABS 0.0 09/22/2019 1948   BASOSABS 0.0 03/16/2019 1133   CMP    Component Value Date/Time   NA 141 09/29/2019 0333   NA 137 03/16/2019 1133   NA 142 09/16/2014 1420   K 3.4 (L) 09/29/2019 0333   K 3.9 09/16/2014 1420   CL 102 09/29/2019 0333   CL 105 09/16/2014 1420   CO2 27 09/29/2019 0333   CO2 29 09/16/2014 1420   GLUCOSE 164 (H) 09/29/2019 0333   GLUCOSE 111 (H) 09/16/2014 1420   BUN 6 (L) 09/29/2019 0333   BUN 25 03/16/2019 1133   BUN 21 (H) 09/16/2014 1420   CREATININE 0.58 09/29/2019 0333   CREATININE 0.86 09/16/2014 1420   CALCIUM 9.2 09/29/2019 0333   CALCIUM 9.2 09/16/2014 1420   PROT 7.3 09/22/2019 1948   PROT 6.2 03/16/2019 1133   PROT 7.7 09/16/2014 1420   ALBUMIN 4.2 09/22/2019 1948   ALBUMIN 4.3 03/16/2019 1133   ALBUMIN 4.0 09/16/2014 1420   AST 29 09/22/2019 1948   AST 41 (H) 09/16/2014 1420   ALT 27 09/22/2019 1948   ALT 42 09/16/2014 1420   ALKPHOS 58 09/22/2019 1948   ALKPHOS 69 09/16/2014 1420   BILITOT 0.6 09/22/2019 1948   BILITOT 0.5 03/16/2019 1133   BILITOT 0.6 09/16/2014 1420   GFRNONAA >60 09/29/2019 0333   GFRNONAA >60 09/16/2014 1420   GFRAA >60 09/29/2019 0333   GFRAA >60 09/16/2014 1420   COAGS Lab Results  Component Value Date   INR 0.9 09/22/2019   INR 1.0 03/25/2018   INR 1.1 (H) 11/19/2013   Lipid Panel    Component Value Date/Time   CHOL 200 09/24/2019 0315   TRIG  114 09/24/2019 0315   HDL 52 09/24/2019 0315   CHOLHDL 3.8 09/24/2019 0315   VLDL 23 09/24/2019 0315   LDLCALC 125 (H) 09/24/2019 0315   HgbA1C  Lab Results  Component Value Date   HGBA1C 6.7 (H) 09/24/2019  Urinalysis    Component Value Date/Time   COLORURINE YELLOW 09/25/2019 1215   APPEARANCEUR CLOUDY (A) 09/25/2019 1215   APPEARANCEUR Clear 03/16/2019 1133   LABSPEC 1.009 09/25/2019 1215   PHURINE 5.0 09/25/2019 1215   GLUCOSEU >=500 (A) 09/25/2019 1215   GLUCOSEU NEGATIVE 08/17/2014 1431   HGBUR MODERATE (A) 09/25/2019 1215   BILIRUBINUR NEGATIVE 09/25/2019 1215   BILIRUBINUR Negative 03/16/2019 1133   KETONESUR 20 (A) 09/25/2019 1215   PROTEINUR 30 (A) 09/25/2019 1215   UROBILINOGEN 0.2 08/17/2014 1431   NITRITE NEGATIVE 09/25/2019 1215   LEUKOCYTESUR LARGE (A) 09/25/2019 1215   Urine Drug Screen No results found for: LABOPIA, COCAINSCRNUR, LABBENZ, AMPHETMU, THCU, LABBARB  Alcohol Level    Component Value Date/Time   ETH <10 09/22/2019 1948     SIGNIFICANT DIAGNOSTIC STUDIES CT Angio Head W or Wo Contrast  Result Date: 09/22/2019 CLINICAL DATA:  Left facial droop and left arm weakness. History of multiple sclerosis. EXAM: CT ANGIOGRAPHY HEAD AND NECK TECHNIQUE: Multidetector CT imaging of the head and neck was performed using the standard protocol during bolus administration of intravenous contrast. Multiplanar CT image reconstructions and MIPs were obtained to evaluate the vascular anatomy. Carotid stenosis measurements (when applicable) are obtained utilizing NASCET criteria, using the distal internal carotid diameter as the denominator. CONTRAST:  55mL OMNIPAQUE IOHEXOL 350 MG/ML SOLN COMPARISON:  Head CT 09/22/2019 FINDINGS: CTA NECK FINDINGS SKELETON: There is no bony spinal canal stenosis. No lytic or blastic lesion. OTHER NECK: Normal pharynx, larynx and major salivary glands. No cervical lymphadenopathy. Unremarkable thyroid gland. UPPER CHEST: No  pneumothorax or pleural effusion. No nodules or masses. AORTIC ARCH: There is no calcific atherosclerosis of the aortic arch. There is no aneurysm, dissection or hemodynamically significant stenosis of the visualized portion of the aorta. Conventional 3 vessel aortic branching pattern. The visualized proximal subclavian arteries are widely patent. RIGHT CAROTID SYSTEM: Normal without aneurysm, dissection or stenosis. LEFT CAROTID SYSTEM: Normal without aneurysm, dissection or stenosis. VERTEBRAL ARTERIES: Left dominant configuration. Both origins are clearly patent. There is no dissection, occlusion or flow-limiting stenosis to the skull base (V1-V3 segments). CTA HEAD FINDINGS POSTERIOR CIRCULATION: --Vertebral arteries: Normal V4 segments. --Posterior inferior cerebellar arteries (PICA): Patent origins from the vertebral arteries. --Anterior inferior cerebellar arteries (AICA): Patent origins from the basilar artery. --Basilar artery: Normal. --Superior cerebellar arteries: Normal. --Posterior cerebral arteries: Normal. Both originate from the basilar artery. Posterior communicating arteries (p-comm) are diminutive or absent. ANTERIOR CIRCULATION: --Intracranial internal carotid arteries: Normal. --Anterior cerebral arteries (ACA): Normal. Both A1 segments are present. Patent anterior communicating artery (a-comm). --Middle cerebral arteries (MCA): Normal. VENOUS SINUSES: As permitted by contrast timing, patent. ANATOMIC VARIANTS: None Review of the MIP images confirms the above findings. IMPRESSION: Normal CTA of the head and neck. Electronically Signed   By: Ulyses Jarred M.D.   On: 09/22/2019 22:12   DG Ankle Complete Right  Result Date: 09/26/2019 CLINICAL DATA:  RIGHT ankle fracture, follow-up EXAM: RIGHT ANKLE - COMPLETE 3+ VIEW COMPARISON:  05/04/2019 FINDINGS: Osseous demineralization. Joint spaces preserved. Scattered soft tissue swelling. Subacute fracture of the distal fibula with fracture line  remaining visible. Mild demineralization at the medial malleolus but no definite fracture plane is seen. No additional fracture, dislocation or bone destruction. IMPRESSION: Subacute fracture of the distal RIGHT fibula with fracture line remaining evident indicating incomplete healing since acute fracture in August 2020. Electronically Signed   By: Lavonia Dana M.D.   On: 09/26/2019 14:39   CT HEAD WO  CONTRAST  Result Date: 09/24/2019 CLINICAL DATA:  Stroke follow-up EXAM: CT HEAD WITHOUT CONTRAST TECHNIQUE: Contiguous axial images were obtained from the base of the skull through the vertex without intravenous contrast. COMPARISON:  Head CT 09/23/2019 FINDINGS: Brain: There is no mass, hemorrhage or extra-axial collection. The size and configuration of the ventricles and extra-axial CSF spaces are normal. Mild hypoattenuation at the site of known right frontoparietal junction infarct. Vascular: No abnormal hyperdensity of the major intracranial arteries or dural venous sinuses. No intracranial atherosclerosis. Skull: The visualized skull base, calvarium and extracranial soft tissues are normal. Sinuses/Orbits: No fluid levels or advanced mucosal thickening of the visualized paranasal sinuses. No mastoid or middle ear effusion. The orbits are normal. IMPRESSION: Expected evolution of right frontoparietal junction infarct without hemorrhage or mass effect. Electronically Signed   By: Ulyses Jarred M.D.   On: 09/24/2019 02:00   CT HEAD WO CONTRAST  Result Date: 09/23/2019 CLINICAL DATA:  Left-sided weakness, follow-up EXAM: CT HEAD WITHOUT CONTRAST TECHNIQUE: Contiguous axial images were obtained from the base of the skull through the vertex without intravenous contrast. COMPARISON:  09/22/2019 FINDINGS: Brain: There is no acute intracranial hemorrhage or mass effect. There is no new loss of gray-white differentiation to suggest evolving recent infarction. Patchy hypoattenuation in the supratentorial white  matter probably reflects stable chronic microvascular ischemic changes. There is no extra-axial fluid collection. Ventricles and sulci are within normal limits in size and configuration. Vascular: There is intracranial atherosclerotic calcification at the skull base. Skull: Calvarium is unremarkable. Sinuses/Orbits: No acute finding. Other: None. IMPRESSION: No acute intracranial hemorrhage or evidence of evolving recent infarction. Stable chronic microvascular ischemic changes. Electronically Signed   By: Macy Mis M.D.   On: 09/23/2019 12:52   CT Angio Neck W and/or Wo Contrast  Result Date: 09/22/2019 CLINICAL DATA:  Left facial droop and left arm weakness. History of multiple sclerosis. EXAM: CT ANGIOGRAPHY HEAD AND NECK TECHNIQUE: Multidetector CT imaging of the head and neck was performed using the standard protocol during bolus administration of intravenous contrast. Multiplanar CT image reconstructions and MIPs were obtained to evaluate the vascular anatomy. Carotid stenosis measurements (when applicable) are obtained utilizing NASCET criteria, using the distal internal carotid diameter as the denominator. CONTRAST:  68mL OMNIPAQUE IOHEXOL 350 MG/ML SOLN COMPARISON:  Head CT 09/22/2019 FINDINGS: CTA NECK FINDINGS SKELETON: There is no bony spinal canal stenosis. No lytic or blastic lesion. OTHER NECK: Normal pharynx, larynx and major salivary glands. No cervical lymphadenopathy. Unremarkable thyroid gland. UPPER CHEST: No pneumothorax or pleural effusion. No nodules or masses. AORTIC ARCH: There is no calcific atherosclerosis of the aortic arch. There is no aneurysm, dissection or hemodynamically significant stenosis of the visualized portion of the aorta. Conventional 3 vessel aortic branching pattern. The visualized proximal subclavian arteries are widely patent. RIGHT CAROTID SYSTEM: Normal without aneurysm, dissection or stenosis. LEFT CAROTID SYSTEM: Normal without aneurysm, dissection or  stenosis. VERTEBRAL ARTERIES: Left dominant configuration. Both origins are clearly patent. There is no dissection, occlusion or flow-limiting stenosis to the skull base (V1-V3 segments). CTA HEAD FINDINGS POSTERIOR CIRCULATION: --Vertebral arteries: Normal V4 segments. --Posterior inferior cerebellar arteries (PICA): Patent origins from the vertebral arteries. --Anterior inferior cerebellar arteries (AICA): Patent origins from the basilar artery. --Basilar artery: Normal. --Superior cerebellar arteries: Normal. --Posterior cerebral arteries: Normal. Both originate from the basilar artery. Posterior communicating arteries (p-comm) are diminutive or absent. ANTERIOR CIRCULATION: --Intracranial internal carotid arteries: Normal. --Anterior cerebral arteries (ACA): Normal. Both A1 segments are present. Patent anterior  communicating artery (a-comm). --Middle cerebral arteries (MCA): Normal. VENOUS SINUSES: As permitted by contrast timing, patent. ANATOMIC VARIANTS: None Review of the MIP images confirms the above findings. IMPRESSION: Normal CTA of the head and neck. Electronically Signed   By: Ulyses Jarred M.D.   On: 09/22/2019 22:12   MR BRAIN WO CONTRAST  Result Date: 09/23/2019 CLINICAL DATA:  Left arm weakness and numbness, slurred speech, facial numbness; history multiple sclerosis EXAM: MRI HEAD WITHOUT CONTRAST TECHNIQUE: Multiplanar, multiecho pulse sequences of the brain and surrounding structures were obtained without intravenous contrast. COMPARISON:  None. FINDINGS: Brain: There is restricted diffusion in the right MCA territory involving the pre- and postcentral gyri in the region of the hand homunculus. Additional punctate focus is present in the right middle frontal gyrus. There is no evidence of intracranial hemorrhage. Multiple foci of T2 hyperintensity are present in the periventricular greater than subcortical white matter in a pattern compatible with history of multiple sclerosis. One of these  areas in the left parietal lobe involves cortex and may instead reflect a chronic infarct. There is also a small T2 hyperintense lesion of the right cerebellar hemisphere again compatible with history of demyelinating disease. There is no intracranial mass or significant mass effect. No extra-axial fluid collection. Ventricles and sulci are within normal limits in size and configuration. Vascular: Major vessel flow voids at the skull base are preserved. Skull and upper cervical spine: Normal marrow signal is preserved. Sinuses/Orbits: Paranasal sinuses are aerated. Orbits are unremarkable. Other: Sella is unremarkable.  Mastoid air cells are clear. IMPRESSION: Acute right frontoparietal infarcts including involvement of the primary sensorimotor gyri. Foci of abnormal T2 signal compatible with history of multiple sclerosis. An area in the left parietal lobe may reflect a chronic infarct rather than chronic demyelinating lesion. Electronically Signed   By: Macy Mis M.D.   On: 09/23/2019 14:14   VAS [place] TRANSCRANIAL DOPPLER W BUBBLES  Result Date: 09/28/2019  Transcranial Doppler with Bubble Indications: Stroke with lower extremity DVT. Performing Technologist: Oda Cogan RDMS, RVT  Examination Guidelines: A complete evaluation includes B-mode imaging, spectral Doppler, color Doppler, and power Doppler as needed of all accessible portions of each vessel. Bilateral testing is considered an integral part of a complete examination. Limited examinations for reoccurring indications may be performed as noted.  Summary:  A vascular evaluation was performed. The right middle cerebral artery was studied. An IV was inserted into the patient's left forearm. Verbal informed consent was obtained.  Trivial HITS at rest and during Valsalva - Spencer 2. PFO size: small. Positive TCD Bubble study indicative of a small right to left shunt. *See table(s) above for measurements and observations.  Diagnosing physician:  Antony Contras MD Electronically signed by Antony Contras MD on 09/28/2019 at 8:27:14 AM.    Final    ECHOCARDIOGRAM COMPLETE  Result Date: 09/23/2019   ECHOCARDIOGRAM REPORT   Patient Name:   Gabriela Rice Date of Exam: 09/23/2019 Medical Rec #:  [id number]         Height:       64.0 in Accession #:    [id number]        Weight:       206.5 lb Date of Birth:  08/08/1956          BSA:          1.98 m Patient Age:    34 years          BP:  119/78 mmHg Patient Gender: F                 HR:           110 bpm. Exam Location:  Inpatient Procedure: 2D Echo, Cardiac Doppler and Color Doppler Indications:    Stroke  History:        Patient has prior history of Echocardiogram examinations, most                 recent 08/22/2015. Abnormal ECG; Risk Factors:Hypertension,                 Diabetes and Dyslipidemia.  Sonographer:    Roseanna Rainbow RDCS Referring Phys: Bertram Comments: Technically difficult study due to poor echo windows, patient is morbidly obese and no subcostal window. Image acquisition challenging due to patient body habitus. Patient had trouble with pressure from probe in apicals. Has a painful rash under breast. IMPRESSIONS  1. Left ventricular ejection fraction, by visual estimation, is 65 to 70%. The left ventricle has hyperdynamic function. There is mildly increased left ventricular hypertrophy.  2. Indeterminate diastolic filling due to E-A fusion.  3. The left ventricle has no regional wall motion abnormalities.  4. Global right ventricle has hyperdynamic systolic function.The right ventricular size is normal. No increase in right ventricular wall thickness.  5. Left atrial size was normal.  6. Right atrial size was normal.  7. Moderate pericardial effusion.  8. The pericardial effusion is posterior to the left ventricle.  9. Mild calcification of the anterior mitral valve leaflet(s). 10. Mild mitral annular calcification. 11. The mitral valve is normal in structure.  Trivial mitral valve regurgitation. 12. The tricuspid valve is grossly normal. 13. The aortic valve is normal in structure. Aortic valve regurgitation is not visualized. Mild aortic valve sclerosis without stenosis. 14. The pulmonic valve was grossly normal. Pulmonic valve regurgitation is not visualized. 15. The aortic root was not well visualized. 16. TR signal is inadequate for assessing pulmonary artery systolic pressure. 17. The inferior vena cava is normal in size with greater than 50% respiratory variability, suggesting right atrial pressure of 3 mmHg. FINDINGS  Left Ventricle: Left ventricular ejection fraction, by visual estimation, is 65 to 70%. The left ventricle has hyperdynamic function. The left ventricle has no regional wall motion abnormalities. There is mildly increased left ventricular hypertrophy. Concentric left ventricular hypertrophy. Indeterminate diastolic filling due to E-A fusion. Right Ventricle: The right ventricular size is normal. No increase in right ventricular wall thickness. Global RV systolic function is has hyperdynamic systolic function. Left Atrium: Left atrial size was normal in size. Right Atrium: Right atrial size was normal in size Pericardium: A moderately sized pericardial effusion is present. The pericardial effusion is posterior to the left ventricle. Mitral Valve: The mitral valve is normal in structure. There is mild thickening of the mitral valve leaflet(s). There is mild calcification of the anterior mitral valve leaflet(s). Mild mitral annular calcification. Trivial mitral valve regurgitation. Tricuspid Valve: The tricuspid valve is grossly normal. Tricuspid valve regurgitation is not demonstrated. Aortic Valve: The aortic valve is normal in structure. Aortic valve regurgitation is not visualized. Mild aortic valve sclerosis is present, with no evidence of aortic valve stenosis. Pulmonic Valve: The pulmonic valve was grossly normal. Pulmonic valve regurgitation is  not visualized. Pulmonic regurgitation is not visualized. Aorta: The aortic root was not well visualized and the aortic root and ascending aorta are structurally normal, with no evidence of dilitation.  Venous: The inferior vena cava is normal in size with greater than 50% respiratory variability, suggesting right atrial pressure of 3 mmHg. IAS/Shunts: No atrial level shunt detected by color flow Doppler.  LEFT VENTRICLE PLAX 2D LVIDd:         3.00 cm       Diastology LVIDs:         2.30 cm       LV e' lateral:   7.94 cm/s LV PW:         1.30 cm       LV E/e' lateral: 14.9 LV IVS:        1.30 cm       LV e' medial:    4.90 cm/s LVOT diam:     1.90 cm       LV E/e' medial:  24.1 LV SV:         17 ml LV SV Index:   8.05 LVOT Area:     2.84 cm  LV Volumes (MOD) LV area d, A2C:    18.40 cm LV area d, A4C:    21.10 cm LV area s, A2C:    8.86 cm LV area s, A4C:    9.12 cm LV major d, A2C:   6.18 cm LV major d, A4C:   6.12 cm LV major s, A2C:   5.06 cm LV major s, A4C:   4.65 cm LV vol d, MOD A2C: 47.1 ml LV vol d, MOD A4C: 58.8 ml LV vol s, MOD A2C: 13.4 ml LV vol s, MOD A4C: 14.7 ml LV SV MOD A2C:     33.7 ml LV SV MOD A4C:     58.8 ml LV SV MOD BP:      38.1 ml RIGHT VENTRICLE RV S prime:     23.90 cm/s LEFT ATRIUM             Index LA diam:        2.70 cm 1.36 cm/m LA Vol (A2C):   45.0 ml 22.69 ml/m LA Vol (A4C):   48.5 ml 24.46 ml/m LA Biplane Vol: 49.9 ml 25.16 ml/m  AORTIC VALVE LVOT Vmax:   137.00 cm/s LVOT Vmean:  92.300 cm/s LVOT VTI:    0.221 m  AORTA Ao Asc diam: 2.90 cm MITRAL VALVE MV Area (PHT): 6.32 cm             SHUNTS MV PHT:        34.80 msec           Systemic VTI:  0.22 m MV Decel Time: 120 msec             Systemic Diam: 1.90 cm MV E velocity: 118.00 cm/s 103 cm/s  Dani Gobble Croitoru MD Electronically signed by Sanda Klein MD Signature Date/Time: 09/23/2019/4:07:20 PM    Final    CT HEAD CODE STROKE WO CONTRAST  Result Date: 09/22/2019 CLINICAL DATA:  Code stroke. Left sided weakness  EXAM: CT HEAD WITHOUT CONTRAST TECHNIQUE: Contiguous axial images were obtained from the base of the skull through the vertex without intravenous contrast. COMPARISON:  None. FINDINGS: Brain: There is no mass, hemorrhage or extra-axial collection. The size and configuration of the ventricles and extra-axial CSF spaces are normal. There is hypoattenuation of the periventricular white matter, most commonly indicating chronic ischemic microangiopathy. Vascular: No abnormal hyperdensity of the major intracranial arteries or dural venous sinuses. No intracranial atherosclerosis. Skull: The visualized skull base, calvarium and extracranial soft tissues are normal. Sinuses/Orbits:  No fluid levels or advanced mucosal thickening of the visualized paranasal sinuses. No mastoid or middle ear effusion. The orbits are normal. ASPECTS [facility] Stroke Program Early CT Score) - Ganglionic level infarction (caudate, lentiform nuclei, internal capsule, insula, M1-M3 cortex): 7 - Supraganglionic infarction (M4-M6 cortex): 3 Total score (0-10 with 10 being normal): 10 IMPRESSION: 1. Chronic small vessel disease without acute abnormality. 2. ASPECTS is 10. 3. These results were called by telephone at the time of interpretation on 09/22/2019 at 7:49 pm to provider Veryl Speak , who verbally acknowledged these results. Electronically Signed   By: Ulyses Jarred M.D.   On: 09/22/2019 19:49   VAS [place] LOWER EXTREMITY VENOUS (DVT)  Result Date: 09/26/2019  Lower Venous Study Indications: Pain, Swelling, and stroke.  Limitations: Body habitus and edema. Comparison Study: Prior negative study from 08/2015 is available for comparison Performing Technologist: Sharion Dove RVS  Examination Guidelines: A complete evaluation includes B-mode imaging, spectral Doppler, color Doppler, and power Doppler as needed of all accessible portions of each vessel. Bilateral testing is considered an integral part of a complete examination. Limited  examinations for reoccurring indications may be performed as noted.  +---------+---------------+---------+-----------+----------+--------------+ RIGHT    CompressibilityPhasicitySpontaneityPropertiesThrombus Aging +---------+---------------+---------+-----------+----------+--------------+ CFV      Full           Yes      Yes                                 +---------+---------------+---------+-----------+----------+--------------+ SFJ      Full                                                        +---------+---------------+---------+-----------+----------+--------------+ FV Prox  Full                                                        +---------+---------------+---------+-----------+----------+--------------+ FV Mid   Full                                                        +---------+---------------+---------+-----------+----------+--------------+ FV DistalFull                                                        +---------+---------------+---------+-----------+----------+--------------+ PFV      Full                                                        +---------+---------------+---------+-----------+----------+--------------+ POP      Full           Yes      Yes                                 +---------+---------------+---------+-----------+----------+--------------+  PTV      None                                         Acute          +---------+---------------+---------+-----------+----------+--------------+ PERO                                                  Not visualized +---------+---------------+---------+-----------+----------+--------------+   +---------+---------------+---------+-----------+----------+--------------+ LEFT     CompressibilityPhasicitySpontaneityPropertiesThrombus Aging +---------+---------------+---------+-----------+----------+--------------+ CFV      Full           Yes      Yes                                  +---------+---------------+---------+-----------+----------+--------------+ SFJ      Full                                                        +---------+---------------+---------+-----------+----------+--------------+ FV Prox  Full                                                        +---------+---------------+---------+-----------+----------+--------------+ FV Mid   Full                                                        +---------+---------------+---------+-----------+----------+--------------+ FV DistalFull                                                        +---------+---------------+---------+-----------+----------+--------------+ PFV      Full                                                        +---------+---------------+---------+-----------+----------+--------------+ POP      Full           Yes      Yes                                 +---------+---------------+---------+-----------+----------+--------------+ PTV      None                                         Acute          +---------+---------------+---------+-----------+----------+--------------+  PERO                                                  Not visualized +---------+---------------+---------+-----------+----------+--------------+     Summary: Right: Findings consistent with acute deep vein thrombosis involving the right posterior tibial veins. Left: Findings consistent with acute deep vein thrombosis involving the left posterior tibial veins.  *See table(s) above for measurements and observations. Electronically signed by Monica Martinez MD on 09/26/2019 at 1:44:37 PM.    Final        HISTORY OF PRESENT ILLNESS Gabriela Rice is a 63 y.o. female with debilitating MS, diabetes, autoimmune hepatitis, hypertension, hyperlipidemia, anxiety, wheelchair-bound secondary to [place] and obesity.  Patient states that yesterday her husband was helping her get  dressed and move the bed down from the waist from a 90 degree to 0 degrees.  At that time she noted that she could not speak to him and her left arm was limp. After he brought her back to 90 degrees she was able to speak to him but her arm only improved slightly.  At that time she was brought to any pen and seen by teleneurology.  Code stroke was called. She was LKW 09/22/2019 at 1655. mRS 5.  Stat head CT showed no hemorrhage.  Chronic small vessel disease without acute hypodensity was noted.  Teleneurology consulted and found an NIH stroke scale of 8.  Overall risks/benefits were felt to favor treatment with TPA.  Infusion was started at 8:11 09/22/2019.  Patient was then transferred to [facility] ED. At time of neuro follow up, patient NIHSS score is 5.  She has significant help at home by her husband secondary to the fact that she cannot move her legs due to MS. She is followed closely by Dr. Jannifer Franklin of [facility]. Currently her only symptoms that she feels is clumsiness with her left hand and weakness with her left arm. She was admitted to 3W.  HOSPITAL COURSE Gabriela Rice is a 63 y.o. female with history of debilitating MS, diabetes, autoimmune hepatitis, hypertension, hyperlipidemia, anxiety, wheelchair-bound secondary to [place], obesity and fractured ankle in Aug 1010, presenting to [facility] with L arm weakness and slurred speech acutely 12/23 at 1655. Received tPA @ [facility] at 12/23 at 2121. Transferred to [facility]/24.    Stroke:   R frontoparietal MCA territory infarcts s/p tPA, felt to be embolic due to paradoxical emboli given b/l DVT and small PFO  CT head No acute abnormality. Small vessel disease. ASPECTS 10.     CTA head & neck Unremarkable   MRI  R frontoparietal infarcts. Abnormal T2 signal, hx MS. ? L parietal infarct vs demyelinating dz  2D o Echo EF 65-70%. No source of embolus   LE dopplers b/l posterior tibial  DVT  TCD bubble study showed small PFO (spencer degree II)   LDL 125  HgbA1c 6.7  SCDs for VTE prophylaxis changed to Lovenox 40 mg sq daily    aspirin 81 mg daily prior to admission, heparin IV transitioned to eliquis (life long)  Therapy recommendations:  HH PT, HH OT, hospital bed, hoyer lift (ordered). To follow up with PCP r/t electric w/c   Disposition:  Return home   LE DVT  LE venous doppler showed b/l posterior tibial DVT  Although distal DVT,  given her bed-bound condition, AC is indicated and likely to be life long  Started on heparin IV per stroke protocol  Now transitioned to eliquis  TCD bubble study showed small PFO   MS   Has been bed bound for 3 months  Followed by Dr. Jannifer Franklin at [facility]  Continue home PT/OT  On Copaxone  Continue to follow up with Dr. Jannifer Franklin as outpt  UTI with urinary retention  UA WBC > 50  Urine culture Klebsiella, sensitive to ceftriaxone     IV Rocephin started 12/26, plan for 5-day course  S/p foley catheter  D/c foley - voiding incontinently - bladder scan low 100s  Hypertension  Home meds:  Cozaar 100  Stable  Resume cozaar  Long-term BP goal normotensive  Hyperlipidemia  Home meds:  pravachol 40  LDL 125, goal < 70  Treated with lipitor 40 in the hospital  At d/c, pt states she cannot take lipitor (refused) and will continue pravastatin at d/c  Will not do intensive statin d/t intolerance  Continue statin at discharge  Diabetes type II Controlled  Home meds:  glucophage 500  HgbA1c 6.7, goal < 7.0  CBGs ac&hs  SSI  wc  Other Stroke Risk Factors  Former Cigarette smoker, quit 23 yrs ago  Obesity, Body mass index is 35.36 kg/m., recommend weight loss, diet and exercise as appropriate   Migraines  Newly identified small PFO this admission   Other Active Problems  Broke ankle in Aug 2020 and has not been able to stand since that time. Repeat imaging shows fracture has not healed.  Spoke w/ ortho - recommends [facility] [place] Rile book which will allow pt to weight bear RLE (ordered) - will need OP f/u by Dr. Aline Brochure in [place] who initially treated pt. Likely needs plated.   Autoimmune hepatitis on imuran  Hypokalemia, resolved - 3.2->3.0 - supplement - 3.7->3.4  DISCHARGE EXAM Blood pressure 135/68, pulse 94, temperature 98 F (36.7 C), temperature source Oral, resp. rate 20, height 5\' 4"  (1.626 m), weight 93.4 kg, SpO2 96 %. Pleasant middle-aged Caucasian lady sitting up comfortably in bed. Not in distress. . Afebrile. Head is nontraumatic. Neck is supple without bruit. Cardiac exam no murmur or gallop. Lungs are clear to auscultation. Distal pulses are well felt. Neurological Exam :  She is awake alert oriented to time place and person.  There is no dysarthria or aphasia or apraxia.  Extraocular movements are full range without nystagmus.  She blinks to threat bilaterally.  Fundi were not visualized.  Face is symmetric without weakness.  Tongue midline.  Motor system exam shows left pronator drift with mild weakness of intrinsic hand muscles and grip on the left. 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  She is able to wiggle her toes in both feet, right DF 3/5 and left DF 2/5.  Deep tendon reflexes are brisker in the lower extremities and upper extremities.  Tone is increased in the lower extremities.  No clonus is noted.  Touch pinprick sensation is preserved bilaterally. Vibration sense is diminished in both lower extremities from ankle down.  Plantars are both upgoing.  Gait not tested.    Discharge Diet   Carb modified thin liquids  DISCHARGE PLAN  Disposition:  Return home  Home health PT and OT  CAM Walking boot RLE to allow for weight bearing  Follow-up Dr. Aline Brochure, ortho in [place], to address non-healing RLE fracture  Eliquis (apixaban) daily for secondary stroke prevention given DVT and MS w/  limited ambulation. Will likely need AC life-long  Ongoing stroke risk factor control by Primary Care Physician at time of discharge  Follow-up PCP Jonathon Jordan, MD in 2 weeks.  Follow-up in [facility] in 4 weeks, office to schedule an appointment.   45 minutes were spent preparing discharge.  Rosalin Hawking, MD PhD Stroke Neurology 09/30/2019 12:12 AM

## 2019-09-28 LAB — GLUCOSE, CAPILLARY
Glucose-Capillary: 170 mg/dL — ABNORMAL HIGH (ref 70–99)
Glucose-Capillary: 176 mg/dL — ABNORMAL HIGH (ref 70–99)
Glucose-Capillary: 147 mg/dL — ABNORMAL HIGH (ref 70–99)
Glucose-Capillary: 132 mg/dL — ABNORMAL HIGH (ref 70–99)

## 2019-09-28 LAB — CBC
HCT: 34.5 % — ABNORMAL LOW (ref 36.0–46.0)
Hemoglobin: 11.9 g/dL — ABNORMAL LOW (ref 12.0–15.0)
MCH: 32.9 pg (ref 26.0–34.0)
MCHC: 34.5 g/dL (ref 30.0–36.0)
MCV: 95.3 fL (ref 80.0–100.0)
Platelets: 225 10*3/uL (ref 150–400)
RBC: 3.62 MIL/uL — ABNORMAL LOW (ref 3.87–5.11)
RDW: 14.4 % (ref 11.5–15.5)
WBC: 5.1 10*3/uL (ref 4.0–10.5)
nRBC: 0 % (ref 0.0–0.2)

## 2019-09-28 LAB — BASIC METABOLIC PANEL
Anion gap: 9 (ref 5–15)
BUN: 6 mg/dL — ABNORMAL LOW (ref 8–23)
CO2: 27 mmol/L (ref 22–32)
Calcium: 9 mg/dL (ref 8.9–10.3)
Chloride: 105 mmol/L (ref 98–111)
Creatinine, Ser: 0.59 mg/dL (ref 0.44–1.00)
GFR calc Af Amer: >60 mL/min (ref >60)
GFR calc non Af Amer: >60 mL/min (ref >60)
Glucose, Bld: 158 mg/dL — ABNORMAL HIGH (ref 70–99)
Potassium: 3.4 mmol/L — ABNORMAL LOW (ref 3.5–5.1)
Sodium: 141 mmol/L (ref 135–145)

## 2019-09-28 MED ORDER — BISACODYL 10 MG RE SUPP
10.0000 mg | Freq: Once | RECTAL | Status: AC
  Administered 2019-09-28: 10 mg via RECTAL
  Filled 2019-09-28: qty 1

## 2019-09-28 MED ORDER — POLYETHYLENE GLYCOL 3350 17 G PO PACK
17.0000 g | PACK | Freq: Every day | ORAL | Status: DC
  Administered 2019-09-28 – 2019-09-29 (×2): 17 g via ORAL
  Filled 2019-09-28 (×2): qty 1

## 2019-09-28 MED ORDER — APIXABAN (ELIQUIS) EDUCATION KIT FOR DVT/PE PATIENTS
PACK | Freq: Once | Status: AC
  Filled 2019-09-28: qty 1

## 2019-09-28 NOTE — TOC Initial Note (Addendum)
Transition of Care [facility]) - Initial/Assessment Note    Patient Details  Name: Gabriela Rice MRN: [id number] Date of Birth: [birth year]  Transition of Care [facility]) CM/SW Contact:    Pollie Friar, RN Phone Number: 09/28/2019, 12:38 PM  Clinical Narrative:                 Plan is for patient to d/c home. She is going to need hospital bed and hoyer lift for home. CM has met with patient and spouse and sent information for DME to [employer].  Spouse states he needs the DME at the home prior to d/c home.  Home health services arranged through [place].   Expected Discharge Plan: [place] Barriers to Discharge: Continued Medical Work up   Patient Goals and CMS Choice   CMS Medicare.gov Compare Post Acute Care list provided to:: Patient Choice offered to / list presented to : Patient, Spouse  Expected Discharge Plan and Services Expected Discharge Plan: [place]   Discharge Planning Services: CM Consult   Living arrangements for the past 2 months: Single Family Home                 DME Arranged: Hospital bed(hoyer lift) DME Agency: [employer] Date DME Agency Contacted: 09/28/19   Representative spoke with at DME Agency: [employer] Arrangements/Services Living arrangements for the past 2 months: [place] Lives with:: Spouse Patient language and need for interpreter reviewed:: Yes Do you feel safe going back to the place where you live?: Yes      Need for Family Participation in Patient Care: Yes (Comment) Care giver support system in place?: Yes (comment)(spouse) Current home services: DME(wheelchair, walker, over the bed table, transport chair, handicap shower) Criminal Activity/Legal Involvement Pertinent to Current Situation/Hospitalization: No - Comment as needed  Activities of Daily Living Home Assistive Devices/Equipment: Raised toilet seat with rails, Wheelchair, Grab bars around toilet, Grab bars  in shower ADL Screening (condition at time of admission) Patient's cognitive ability adequate to safely complete daily activities?: Yes Is the patient deaf or have difficulty hearing?: No Does the patient have difficulty seeing, even when wearing glasses/contacts?: Yes(wears glasses) Does the patient have difficulty concentrating, remembering, or making decisions?: No Patient able to express need for assistance with ADLs?: Yes Does the patient have difficulty dressing or bathing?: Yes Independently performs ADLs?: No Communication: Independent Dressing (OT): Needs assistance Is this a change from baseline?: Pre-admission baseline Grooming: Needs assistance Is this a change from baseline?: Pre-admission baseline Feeding: Needs assistance Is this a change from baseline?: Pre-admission baseline Bathing: Needs assistance Is this a change from baseline?: Pre-admission baseline Toileting: Needs assistance Is this a change from baseline?: Pre-admission baseline In/Out Bed: Needs assistance Is this a change from baseline?: Pre-admission baseline Walks in Home: Dependent(uses wheelchair) Is this a change from baseline?: Pre-admission baseline Does the patient have difficulty walking or climbing stairs?: Yes Weakness of Legs: Both Weakness of Arms/Hands: Both  Permission Sought/Granted                  Emotional Assessment Appearance:: Appears stated age Attitude/Demeanor/Rapport: Engaged Affect (typically observed): Accepting, Pleasant Orientation: : Oriented to Self, Oriented to Place, Oriented to  Time, Oriented to Situation   Psych Involvement: No (comment)  Admission diagnosis:  Stroke [facility]) [I63.9] Stroke (cerebrum) (HCC) [I63.9] Acute CVA (cerebrovascular accident) [facility]) [I63.9] Patient Active Problem List  Diagnosis Date Noted  . Hypokalemia   . Acute deep vein thrombosis (DVT) of tibial vein of both lower extremities (HCC)   . Acute urinary retention   . Stroke  (cerebrum) ([place]) 09/23/2019  . Stroke ([place]) 09/23/2019  . Acute CVA (cerebrovascular accident) [facility]) s/p tPA, embolic, unknown source 04/10/1974  . Ankle fracture, right 05/15/2019  . Benign paroxysmal positional vertigo 09/06/2015  . Constipation   . Weakness of both legs   . Essential hypertension   . Type 2 diabetes mellitus with diabetic neuropathy ([place])   . Morbid obesity ([place])   . Generalized anxiety disorder   . Lumbar spondylosis with myelopathy   . Dehydration   . Acute lower UTI   . Multiple sclerosis exacerbation ([place]) 08/20/2015  . Abdominal pain 08/20/2015  . Autoimmune hepatitis ([place]) 08/20/2015  . Diabetes mellitus type 2, controlled ([place]) 08/20/2015  . Hypertension 08/20/2015  . Right-sided Lumbar Facet Syndrome 07/13/2015  . Lumbar spondylosis 07/13/2015  . Chronic low back pain 07/13/2015  . Trigger point with back pain 07/13/2015  . Myofascial pain syndrome 07/13/2015  . Chronic pain 07/13/2015  . Chronic pain syndrome 07/13/2015  . Generalized weakness 07/13/2015  . Right-sided chronic neck pain 07/13/2015  . Cervical spondylosis 07/13/2015  . DDD (degenerative disc disease), cervical 07/13/2015  . Cervical facet syndrome 07/13/2015  . DDD (degenerative disc disease), lumbar 07/13/2015  . Pain of right lower extremity 07/13/2015  . Chronic radicular lumbar pain 07/13/2015  . Non-insulin dependent type 2 diabetes mellitus ([place]) 07/13/2015  . History of hysterectomy 07/13/2015  . History of C-section 07/13/2015  . DM (diabetes mellitus) ([place]) 07/13/2015  . Hyperlipidemia 07/13/2015  . Obesity 07/13/2015  . Diverticulosis 07/13/2015  . Lumbago 02/02/2014  . Difficulty in walking(719.7) 02/02/2014  . HBP (high blood pressure) 07/11/2013  . Chronic rhinitis 04/08/2013  . Abnormality of gait 02/16/2013  . Elevated liver function tests 02/12/2013  . Abdominal pain, right upper quadrant 02/12/2013  . Nausea alone 02/12/2013  . Dyspnea 02/04/2013  .  Hypoxemia, nocturnal only  02/04/2013  . Encounter for long-term (current) use of other medications 10/14/2012  . MS (multiple sclerosis) ([place]) 10/14/2012  . IRRITABLE BOWEL SYNDROME 03/29/2009  . FATTY LIVER DISEASE 03/29/2009  . Nonspecific elevation of levels of transaminase or lactic acid dehydrogenase (LDH) 03/29/2009  . PERSONAL HX COLONIC POLYPS 03/29/2009   PCP:  Jonathon Jordan, MD Pharmacy:   [facility], [place] AT [place] [address] [place] [address] Phone: [phone] Fax: [phone]  [place], [place] [address] Phone: [phone] Fax: [phone]     Social Determinants of Health (SDOH) Interventions    Readmission Risk Interventions No flowsheet data found.

## 2019-09-28 NOTE — Progress Notes (Signed)
Occupational Therapy Treatment Patient Details Name: Gabriela Rice MRN: [id number] DOB: [birth year] Today's Date: 09/28/2019    History of present illness This 63 y.o. female admitted with Lt UE weakness and difficulty speaking.  MRI showed Rt frontoparietal infarcts including involvement of the primary sensorimotor gyri.  PMH includes:  debilltating MS, obesity, DM, autoimmune hepatitis, anxiety, HTN. Doppler showed B tibial DVTs.   OT comments  Pt demonstrates improved strenght and coordination of Lt UE.  She is able to perform HEP independently.  Instructed her on wear schedule for PRAFOs   Follow Up Recommendations  Home health OT;Other (comment)    Equipment Recommendations  Hospital bed;Other (comment)(hoyer lift and power w/c )    Recommendations for Other Services      Precautions / Restrictions Precautions Precautions: Fall       Mobility Bed Mobility                  Transfers                      Balance                                           ADL either performed or assessed with clinical judgement   ADL                                               Vision       Perception     Praxis      Cognition Arousal/Alertness: Awake/alert Behavior During Therapy: WFL for tasks assessed/performed Overall Cognitive Status: Within Functional Limits for tasks assessed                                 General Comments: Pt occasionally gets info mixed up         Exercises Other Exercises Other Exercises: Pt able to demonstrate independence with HEP provided yesterday.  worked on functional reach with good aligment of shoulder progressing to reaching for cup of water weighing between 1-2lbs in all planes.  She fatigued after ~40 reps  Other Exercises: Pt instructed to wear PRAFOs at night when at home and she verbalized understanding    Shoulder Instructions       General Comments       Pertinent Vitals/ Pain       Pain Assessment: Faces Faces Pain Scale: No hurt  Home Living                                          Prior Functioning/Environment              Frequency  Min 2X/week        Progress Toward Goals  OT Goals(current goals can now be found in the care plan section)  Progress towards OT goals: Progressing toward goals     Plan Equipment recommendations need to be updated    Co-evaluation                 AM-PAC OT "6 Clicks" Daily Activity  Outcome Measure   Help from another person eating meals?: None Help from another person taking care of personal grooming?: A Little Help from another person toileting, which includes using toliet, bedpan, or urinal?: Total Help from another person bathing (including washing, rinsing, drying)?: A Lot Help from another person to put on and taking off regular upper body clothing?: A Lot Help from another person to put on and taking off regular lower body clothing?: Total 6 Click Score: 13    End of Session    OT Visit Diagnosis: Muscle weakness (generalized) (M62.81)   Activity Tolerance Patient tolerated treatment well   Patient Left in bed   Nurse Communication          Time: [id number] OT Time Calculation (min): 10 min  Charges: OT General Charges $OT Visit: 1 Visit OT Treatments $Neuromuscular Re-education: 8-22 mins  Nilsa Nutting., OTR/L Acute Rehabilitation Services Pager [phone] Office [phone]    Lucille Passy M 09/28/2019, 6:22 PM

## 2019-09-28 NOTE — TOC Benefit Eligibility Note (Signed)
Transition of Care [facility]) Benefit Eligibility Note    Patient Details  Name: Gabriela Rice MRN: [id number] Date of Birth: 1956-09-05   Medication/Dose: Arne Cleveland  5 MG BID  Covered?: Yes  Tier: 3 Drug  Prescription Coverage Preferred Pharmacy: [facility]man with Person/Company/Phone Number:: NICIE  @ [employer]  / [employer] #  [phone]  Co-Pay: $ 24.11  Prior Approval: No  Deductible: Met       Memory Argue Phone Number: 09/28/2019, 4:14 PM

## 2019-09-28 NOTE — Discharge Instructions (Signed)
Information on my medicine - ELIQUIS® (apixaban) ° ° °Why was Eliquis® prescribed for you? °Eliquis® was prescribed to treat blood clots that may have been found in the veins of your legs (deep vein thrombosis) or in your lungs (pulmonary embolism) and to reduce the risk of them occurring again. ° °What do You need to know about Eliquis® ? °The dose is 5 mg taken TWICE daily.  Eliquis® may be taken with or without food.  ° °Try to take the dose about the same time in the morning and in the evening. If you have difficulty swallowing the tablet whole please discuss with your pharmacist how to take the medication safely. ° °Take Eliquis® exactly as prescribed and DO NOT stop taking Eliquis® without talking to the doctor who prescribed the medication.  Stopping may increase your risk of developing a new blood clot.  Refill your prescription before you run out. ° °After discharge, you should have regular check-up appointments with your healthcare provider that is prescribing your Eliquis®. °   °What do you do if you miss a dose? °If a dose of ELIQUIS® is not taken at the scheduled time, take it as soon as possible on the same day and twice-daily administration should be resumed. The dose should not be doubled to make up for a missed dose. ° °Important Safety Information °A possible side effect of Eliquis® is bleeding. You should call your healthcare provider right away if you experience any of the following: °? Bleeding from an injury or your nose that does not stop. °? Unusual colored urine (red or dark brown) or unusual colored stools (red or black). °? Unusual bruising for unknown reasons. °? A serious fall or if you hit your head (even if there is no bleeding). ° °Some medicines may interact with Eliquis® and might increase your risk of bleeding or clotting while on Eliquis®. To help avoid this, consult your healthcare provider or pharmacist prior to using any new prescription or non-prescription medications,  including herbals, vitamins, non-steroidal anti-inflammatory drugs (NSAIDs) and supplements. ° °This website has more information on Eliquis® (apixaban): http://www.eliquis.com/eliquis/home ° °

## 2019-09-28 NOTE — Evaluation (Signed)
Speech Language Pathology Evaluation Patient Details Name: Gabriela Rice MRN: [id number] DOB: 04-02-1956 Today's Date: 09/28/2019 Time: 1205-1226 SLP Time Calculation (min) (ACUTE ONLY): 21 min  Problem List:  Patient Active Problem List   Diagnosis Date Noted  . Hypokalemia   . Acute deep vein thrombosis (DVT) of tibial vein of both lower extremities (HCC)   . Acute urinary retention   . Stroke (cerebrum) ([place]) 09/23/2019  . Stroke ([place]) 09/23/2019  . Acute CVA (cerebrovascular accident) [facility]) s/p tPA, embolic, unknown source 0000000  . Ankle fracture, right 05/15/2019  . Benign paroxysmal positional vertigo 09/06/2015  . Constipation   . Weakness of both legs   . Essential hypertension   . Type 2 diabetes mellitus with diabetic neuropathy ([place])   . Morbid obesity ([place])   . Generalized anxiety disorder   . Lumbar spondylosis with myelopathy   . Dehydration   . Acute lower UTI   . Multiple sclerosis exacerbation ([place]) 08/20/2015  . Abdominal pain 08/20/2015  . Autoimmune hepatitis ([place]) 08/20/2015  . Diabetes mellitus type 2, controlled ([place]) 08/20/2015  . Hypertension 08/20/2015  . Right-sided Lumbar Facet Syndrome 07/13/2015  . Lumbar spondylosis 07/13/2015  . Chronic low back pain 07/13/2015  . Trigger point with back pain 07/13/2015  . Myofascial pain syndrome 07/13/2015  . Chronic pain 07/13/2015  . Chronic pain syndrome 07/13/2015  . Generalized weakness 07/13/2015  . Right-sided chronic neck pain 07/13/2015  . Cervical spondylosis 07/13/2015  . DDD (degenerative disc disease), cervical 07/13/2015  . Cervical facet syndrome 07/13/2015  . DDD (degenerative disc disease), lumbar 07/13/2015  . Pain of right lower extremity 07/13/2015  . Chronic radicular lumbar pain 07/13/2015  . Non-insulin dependent type 2 diabetes mellitus ([place]) 07/13/2015  . History of hysterectomy 07/13/2015  . History of C-section 07/13/2015  . DM (diabetes mellitus) ([place]) 07/13/2015   . Hyperlipidemia 07/13/2015  . Obesity 07/13/2015  . Diverticulosis 07/13/2015  . Lumbago 02/02/2014  . Difficulty in walking(719.7) 02/02/2014  . HBP (high blood pressure) 07/11/2013  . Chronic rhinitis 04/08/2013  . Abnormality of gait 02/16/2013  . Elevated liver function tests 02/12/2013  . Abdominal pain, right upper quadrant 02/12/2013  . Nausea alone 02/12/2013  . Dyspnea 02/04/2013  . Hypoxemia, nocturnal only  02/04/2013  . Encounter for long-term (current) use of other medications 10/14/2012  . MS (multiple sclerosis) ([place]) 10/14/2012  . IRRITABLE BOWEL SYNDROME 03/29/2009  . FATTY LIVER DISEASE 03/29/2009  . Nonspecific elevation of levels of transaminase or lactic acid dehydrogenase (LDH) 03/29/2009  . PERSONAL HX COLONIC POLYPS 03/29/2009   Past Medical History:  Past Medical History:  Diagnosis Date  . Abnormality of gait 02/16/2013  . Allergic rhinitis   . Allergy   . Anxiety   . Autoimmune hepatitis ([place])   . Autoimmune hepatitis ([place])   . Colon polyps   . Diverticulosis   . DM (diabetes mellitus) ([place])   . Elevated LFTs   . Hemorrhoids   . Hemorrhoids   . Hepatitis    with cholestasis  . Hyperlipidemia   . Hypertension   . Migraines   . Multiple sclerosis ([place])   . Obesity   . Vitamin D deficiency    Past Surgical History:  Past Surgical History:  Procedure Laterality Date  . ABDOMINAL HYSTERECTOMY    . CESAREAN SECTION     x 2  . MYOECTOMY    . TONSILLECTOMY AND ADENOIDECTOMY     HPI:  Ms. Gabriela Rice is a 63  y.o. female with history of debilitating MS, diabetes, autoimmune hepatitis, hypertension, hyperlipidemia, anxiety, wheelchair-bound secondary to MS, obesity and fractured ankle in Aug 1010, presenting to [facility] with L arm weakness and slurred speech acutely 12/23 at 1655. Received tPA @ [facility] at 12/23 at 2121. Transferred to [facility]. MRI 12/24: "Acute right frontoparietal infarcts including involvement  of theprimary sensorimotor gyri."   Assessment / Plan / Recommendation Clinical Impression  Pt presents with functional cognitive linguistic abilities consistent with her baseline level of function.  Pt was assessed using the COGNISTAT (see below for additional information).  Pt performed within the average range on all subtests except for the delayed recall task.  Pt reports that this is consistent with her baseline level of function and that she frequently receives short term recall tasks during her ongoing assessment/management for MS with [facility].  Pt has no concerns about her ability to resume her ADLs as she did previously prior to hospitalization. Pt notes that she needs to regain strength in LUE and is working with PT/OT to address these goals.  Pt's speech is clear and without dysarthria.  Pt did not exhibit any word finding difficulties in conversation.  Pt has no acute speech therapy needs at this time.  SLP will sign off.  COGNISTAT - all subtests are within the average range, except where otherwise specified Orientation: 12/12 Attention: 8/8 Comprehension: 6/6 Repetition: 12/12 Naming: 7/8 Construction: not assessed Memory: 7/12, mild-moderate impairment Calculations: 4/4 Similarities: 6/8 Judgment: 5/6    SLP Assessment  SLP Recommendation/Assessment: Patient does not need any further Speech Lanaguage Pathology Services SLP Visit Diagnosis: Cognitive communication deficit (R41.841)    Follow Up Recommendations  None    Frequency and Duration   N/A        SLP Evaluation Cognition  Overall Cognitive Status: Within Functional Limits for tasks assessed Arousal/Alertness: Awake/alert Orientation Level: Oriented X4 Attention: Focused Focused Attention: Appears intact Memory: Impaired Memory Impairment: Decreased short term memory Awareness: Appears intact Problem Solving: Appears intact Executive Function: Reasoning Reasoning: Appears intact        Comprehension  Auditory Comprehension Overall Auditory Comprehension: Appears within functional limits for tasks assessed Commands: Within Functional Limits Conversation: Complex    Expression Expression Primary Mode of Expression: Verbal Verbal Expression Overall Verbal Expression: Appears within functional limits for tasks assessed Repetition: No impairment Naming: No impairment Pragmatics: No impairment   Oral / Pharmacist, community Speech Overall Motor Speech: Appears within functional limits for tasks assessed Phonation: Normal Resonance: Within functional limits Articulation: Within functional limitis Intelligibility: Intelligible Motor Planning: Witnin functional limits Motor Speech Errors: Not applicable   [place], [place], [place] Office: [phone] 09/28/2019, 12:57 PM

## 2019-09-28 NOTE — Care Management Important Message (Signed)
Important Message  Patient Details  Name: Gabriela Rice MRN: [id number] Date of Birth: Oct 10, 1955   Medicare Important Message Given:  Yes     Shelda Altes 09/28/2019, 3:27 PM

## 2019-09-28 NOTE — Progress Notes (Addendum)
Length of Need Lifetime   The above medical condition requires: Patient requires the ability to reposition frequently   Bed type Semi-electric   Hoyer Lift Yes    Patient has Multiple Sclerosis which requires her head to be positioned in ways not feasible with a normal bed.

## 2019-09-28 NOTE — Progress Notes (Signed)
STROKE TEAM PROGRESS NOTE   Interval history Lying in bed, no family at bedside. Gabriela Rice stated that Gabriela Rice had the walking boot at home that home PT/OT can use. Pending home setup for device delivery before d/c home.    Vitals:   09/27/19 2338 09/28/19 0354 09/28/19 0824 09/28/19 1150  BP: 124/82 123/79 134/87 (!) 150/82  Pulse: (!) 108 97 (!) 108 89  Resp: 18 18 15 18   Temp: 98.9 F (37.2 C) 98.5 F (36.9 C) 98.3 F (36.8 C) 98.2 F (36.8 C)  TempSrc: Oral Oral Oral Oral  SpO2: 93% 93% 93% 90%  Weight:      Height:        CBC:  Recent Labs  Lab 09/22/19 1948 09/27/19 0718 09/28/19 0318  WBC 5.7 4.4 5.1  NEUTROABS 3.7  --   --   HGB 13.3 11.3* 11.9*  HCT 39.1 32.4* 34.5*  MCV 97.8 94.7 95.3  PLT 275 215 [id number]    Basic Metabolic Panel:  Recent Labs  Lab 09/27/19 0718 09/28/19 0318  NA 142 141  K 3.7 3.4*  CL 106 105  CO2 26 27  GLUCOSE 156* 158*  BUN 7* 6*  CREATININE 0.53 0.59  CALCIUM 8.8* 9.0   Lipid Panel:     Component Value Date/Time   CHOL 200 09/24/2019 0315   TRIG 114 09/24/2019 0315   HDL 52 09/24/2019 0315   CHOLHDL 3.8 09/24/2019 0315   VLDL 23 09/24/2019 0315   LDLCALC 125 (H) 09/24/2019 0315   HgbA1c:  Lab Results  Component Value Date   HGBA1C 6.7 (H) 09/24/2019   Urine Drug Screen: No results found for: LABOPIA, COCAINSCRNUR, LABBENZ, AMPHETMU, THCU, LABBARB  Alcohol Level     Component Value Date/Time   ETH <10 09/22/2019 1948    IMAGING past 48h DG Ankle Complete Right  Result Date: 09/26/2019 CLINICAL DATA:  RIGHT ankle fracture, follow-up EXAM: RIGHT ANKLE - COMPLETE 3+ VIEW COMPARISON:  05/04/2019 FINDINGS: Osseous demineralization. Joint spaces preserved. Scattered soft tissue swelling. Subacute fracture of the distal fibula with fracture line remaining visible. Mild demineralization at the medial malleolus but no definite fracture plane is seen. No additional fracture, dislocation or bone destruction. IMPRESSION: Subacute  fracture of the distal RIGHT fibula with fracture line remaining evident indicating incomplete healing since acute fracture in August 2020. Electronically Signed   By: Lavonia Dana M.D.   On: 09/26/2019 14:39   VAS [place] TRANSCRANIAL DOPPLER W BUBBLES  Result Date: 09/28/2019  Transcranial Doppler with Bubble Indications: Stroke with lower extremity DVT. Performing Technologist: Oda Cogan RDMS, RVT  Examination Guidelines: A complete evaluation includes B-mode imaging, spectral Doppler, color Doppler, and power Doppler as needed of all accessible portions of each vessel. Bilateral testing is considered an integral part of a complete examination. Limited examinations for reoccurring indications may be performed as noted.  Summary:  A vascular evaluation was performed. The right middle cerebral artery was studied. An IV was inserted into the patient's left forearm. Verbal informed consent was obtained.  Trivial HITS at rest and during Valsalva - Spencer 2. PFO size: small. Positive TCD Bubble study indicative of a small right to left shunt. *See table(s) above for measurements and observations.  Diagnosing physician: Antony Contras MD Electronically signed by Antony Contras MD on 09/28/2019 at 8:27:14 AM.    Final     PHYSICAL EXAM Gabriela Rice sitting up comfortably in bed.  Not in distress. . Afebrile. Head is nontraumatic. Neck is  supple without bruit.    Cardiac exam no murmur or gallop. Lungs are clear to auscultation. Distal pulses are well felt.  Neurological Exam :  Gabriela Rice is awake alert oriented to time place and person.  There is no dysarthria or aphasia or apraxia.  Extraocular movements are full range without nystagmus.  Gabriela Rice blinks to threat bilaterally.  Fundi were not visualized.  Face is symmetric without weakness.  Tongue midline.  Motor system exam shows left pronator drift with mild weakness of intrinsic hand muscles and grip on the left. 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  Gabriela Rice is able to wiggle her toes in both feet, right DF 3/5 and left DF 2/5.  Deep tendon reflexes are brisker in the lower extremities and upper extremities.  Tone is increased in the lower extremities.  No clonus is noted.  Touch pinprick sensation is preserved bilaterally.  Vibration sense is diminished in both lower extremities from ankle down.  Plantars are both upgoing.  Gait not tested.    ASSESSMENT/PLAN Gabriela Rice is a 63 y.o. female with history of debilitating MS, diabetes, autoimmune hepatitis, hypertension, hyperlipidemia, anxiety, wheelchair-bound secondary to [place], obesity and fractured ankle in Aug 1010, presenting to [facility] with L arm weakness and slurred speech acutely 12/23 at 1655. Received tPA @ [facility] at 12/23 at 2121. Transferred to [facility].    Stroke:   R frontoparietal MCA territory infarcts s/p tPA, felt to be embolic due to paradoxical emboli given b/l DVT and small PFO  CT head No acute abnormality. Small vessel disease. ASPECTS 10.     CTA head & neck Unremarkable   MRI  R frontoparietal infarcts. Abnormal T2 signal, hx MS. ? L parietal infarct vs demyelinating dz  2D o Echo EF 65-70%. No source of embolus   LE dopplers b/l posterior tibial DVT  TCD bubble study showed small PFO (spencer degree II)   LDL 125  HgbA1c 6.7  SCDs for VTE prophylaxis changed to Lovenox 40 mg sq daily    aspirin 81 mg daily prior to admission, heparin IV transitioned to eliquis (life long)  Therapy recommendations:  HH PT, HH OT, hospital bed, hoyer lift (ordered). To follow up with PCP r/t electric w/c   Disposition:  Return home once home equipment delivered   LE DVT  LE venous doppler showed b/l posterior tibial DVT  Although distal DVT, given her bed-bound condition, AC is indicated and likely to be life long  Started on heparin IV per stroke protocol  Now  transitioned to eliquis  TCD bubble study showed small PFO   MS   Has been bed bound for 3 months  Follow[facility]  Continue home PT/OT  On Copaxone  Continue to follow up with Dr. Jannifer Franklin as outpt  UTI with urinary retention  UA WBC > 50  Urine culture Klebsiella, sensitive to ceftriaxone     IV Rocephin started 12/26, plan for 5-day course  S/p foley catheter  D/c foley - voiding incontinently - bladdre scan low 100s - may I&O cath if needed   Hypertension  Home meds:  Cozaar 100  Stable . Resume cozaar . Long-term BP goal normotensive  Hyperlipidemia  Home meds:  pravachol 40  LDL 125, goal < 70  Now on lipitor 40  Continue statin at discharge  Diabetes type II Controlled  Home meds:  glucophage 500  HgbA1c 6.7, goal < 7.0  CBGs ac&hx  SSI  wc  Other Stroke Risk Factors  Former Cigarette smoker, quit 23 yrs ago  Obesity, Body mass index is 35.36 kg/m., recommend weight loss, diet and exercise as appropriate   Migraines  Other Active Problems  Broke ankle in Aug 2020 and has not been able to stand since that time. Repeat imaging shows fracture has not healed. Spoke w/ ortho - recommends [facility] Rile book which will allow pt to weight bear RLE (ordered) - will need OP f/u by Dr. Aline Brochure in [place] who initially treated pt. Likely needs plated.   Autoimmune hepatitis on imuran  Hypokalemia, resolved - 3.2->3.0 - supplement - 3.7->3.4  Hospital day #6  Rosalin Hawking, MD PhD Stroke Neurology 09/28/2019 11:56 AM   To contact Stroke Continuity provider, please refer to [email or web address] After hours, contact General Neurology

## 2019-09-29 LAB — BASIC METABOLIC PANEL
Anion gap: 12 (ref 5–15)
BUN: 6 mg/dL — ABNORMAL LOW (ref 8–23)
CO2: 27 mmol/L (ref 22–32)
Calcium: 9.2 mg/dL (ref 8.9–10.3)
Chloride: 102 mmol/L (ref 98–111)
Creatinine, Ser: 0.58 mg/dL (ref 0.44–1.00)
GFR calc Af Amer: >60 mL/min (ref >60)
GFR calc non Af Amer: >60 mL/min (ref >60)
Glucose, Bld: 164 mg/dL — ABNORMAL HIGH (ref 70–99)
Potassium: 3.4 mmol/L — ABNORMAL LOW (ref 3.5–5.1)
Sodium: 141 mmol/L (ref 135–145)

## 2019-09-29 LAB — CBC
HCT: 34.5 % — ABNORMAL LOW (ref 36.0–46.0)
Hemoglobin: 12.1 g/dL (ref 12.0–15.0)
MCH: 33.1 pg (ref 26.0–34.0)
MCHC: 35.1 g/dL (ref 30.0–36.0)
MCV: 94.3 fL (ref 80.0–100.0)
Platelets: 216 10*3/uL (ref 150–400)
RBC: 3.66 MIL/uL — ABNORMAL LOW (ref 3.87–5.11)
RDW: 14.5 % (ref 11.5–15.5)
WBC: 5.3 10*3/uL (ref 4.0–10.5)
nRBC: 0 % (ref 0.0–0.2)

## 2019-09-29 LAB — GLUCOSE, CAPILLARY
Glucose-Capillary: 149 mg/dL — ABNORMAL HIGH (ref 70–99)
Glucose-Capillary: 157 mg/dL — ABNORMAL HIGH (ref 70–99)

## 2019-09-29 MED ORDER — APIXABAN 5 MG PO TABS: 5.0000 mg | ORAL_TABLET | Freq: Two times a day (BID) | ORAL | 0 refills | Status: DC

## 2019-09-29 MED ORDER — POLYETHYLENE GLYCOL 3350 17 GM/SCOOP PO POWD: 17.0000 g | Freq: Every day | ORAL | Status: AC | PRN

## 2019-09-29 MED ORDER — ATORVASTATIN CALCIUM 40 MG PO TABS: 40.0000 mg | ORAL_TABLET | Freq: Every day | ORAL | 2 refills | Status: DC

## 2019-09-29 MED FILL — ELIQUIS 5 MG TABLET: 5 | 30 days supply | Qty: 60 | Fill #0

## 2019-09-29 NOTE — Progress Notes (Signed)
  PT Cancellation Note  Patient Details Name: Gabriela Rice MRN: [id number] DOB: 04/27/1956   Cancelled Treatment:    Reason Eval/Treat Not Completed: Other (comment) Pt politely declining physical therapy session due to abdominal discomfort and constipation. Has no questions/concerns regarding upcoming discharge. Please see prior note for follow up recommendations.  Ellamae Sia, PT, DPT Acute Rehabilitation Services Pager [phone] Office [phone]    Willy Eddy 09/29/2019, 1:44 PM

## 2019-09-29 NOTE — Progress Notes (Signed)
NURSING PROGRESS NOTE  Gabriela Rice [id number] Discharge Data: 09/29/2019 3:53 PM Attending Provider: Rosalin Hawking, MD [id number], Ivin Booty, MD     [name] discharged per MD order.  Discussed with the patient and husband the After Visit Summary and all questions fully answered. All IV's discontinued with no bleeding noted. All belongings returned to patient for patient to take home. Patient transported to private residence via Biomedical scientist by [place].  Last Vital Signs:  Blood pressure 135/68, pulse 94, temperature 98 F (36.7 C), temperature source Oral, resp. rate 18, height 5\' 4"  (1.626 m), weight 93.4 kg, SpO2 96 %.  Discharge Medication List Allergies as of 09/29/2019      Reactions   Penicillins Other (See Comments)   seizure as child Has patient had a PCN reaction causing immediate rash, facial/tongue/throat swelling, SOB or lightheadedness with hypotension: No Has patient had a PCN reaction causing severe rash involving mucus membranes or skin necrosis:No Has patient had a PCN reaction that required hospitalizationNo Has patient had a PCN reaction occurring within the last 10 years:No If all of the above answers are "NO", then may proceed with Cephalosporin use.   Codeine Swelling   hands swell      Medication List    STOP taking these medications   aspirin EC 81 MG tablet   ibuprofen 200 MG tablet Commonly known as: ADVIL     TAKE these medications   ALPRAZolam 1 MG tablet Commonly known as: XANAX TAKE 1 TABLET BY MOUTH AT BEDTIME AS NEEDED What changed: reasons to take this   apixaban 5 MG Tabs tablet Commonly known as: Eliquis Take 1 tablet (5 mg total) by mouth 2 (two) times daily.   azaTHIOprine 50 MG tablet Commonly known as: IMURAN TAKE 3 TABLETS BY MOUTH EVERY DAY AT THE SAME TIME;  Patient needs office visit for further refills What changed:   how much to take  how to take this  when to take this  additional instructions   baclofen 10  MG tablet Commonly known as: LIORESAL One tablet in the morning and evening and 1.5 tablets at midday What changed:   how much to take  how to take this  when to take this  additional instructions   cetirizine 10 MG tablet Commonly known as: ZYRTEC Take 10 mg by mouth daily.   Copaxone 40 MG/ML [place] Generic drug: Glatiramer Acetate INJECT 40 MG (1 ML) UNDER THE SKIN THREE TIMES A WEEK. What changed:   how much to take  how to take this  when to take this  additional instructions Notes to patient: Take on Friday 10/01/2019   DULoxetine 30 MG capsule Commonly known as: CYMBALTA Take 90 mg by mouth daily.   gabapentin 300 MG capsule Commonly known as: NEURONTIN TAKE 1 CAPSULE BY MOUTH THREE TIMES A DAY What changed: See the new instructions.   losartan 100 MG tablet Commonly known as: COZAAR Take 100 mg by mouth daily.   metFORMIN 500 MG tablet Commonly known as: GLUCOPHAGE Take 500 mg by mouth 2 (two) times daily with a meal.   polyethylene glycol powder 17 GM/SCOOP powder Commonly known as: GLYCOLAX/MIRALAX Take 17 g by mouth daily as needed for mild constipation. What changed: See the new instructions.   pravastatin 40 MG tablet Commonly known as: PRAVACHOL Take 40 mg by mouth daily.   traMADol 50 MG tablet Commonly known as: ULTRAM TAKE 1 TABLET BY MOUTH THREE TIMES DAILY AS NEEDED What changed: reasons to  take this   Vitamin D 50 MCG (2000 UT) Caps Take 2,000 Units by mouth daily.            Durable Medical Equipment  (From admission, onward)         Start     Ordered   09/28/19 1153  For home use only DME Hospital bed  Once    Question Answer Comment  Length of Need Lifetime   The above medical condition requires: Patient requires the ability to reposition frequently   Bed type Semi-electric   Hoyer Lift Yes      09/28/19 1155

## 2019-09-29 NOTE — TOC Transition Note (Signed)
Transition of Care [facility]) - CM/SW Discharge Note   Patient Details  Name: JALAILA CADENA MRN: [id number] Date of Birth: 1955-11-13  Transition of Care [facility]) CM/SW Contact:  Pollie Friar, RN Phone Number: 09/29/2019, 2:06 PM   Clinical Narrative:    Pt discharging home with [facility] services through [place]. Cory with [facility] aware of d/c. DME has been delivered to the home per spouse.  PTAR arranged for transport home. D/c packet at the desk and bedside RN updated.   Final next level of care: Home w Home Health Services Barriers to Discharge: No Barriers Identified   Patient Goals and CMS Choice   CMS Medicare.gov Compare Post Acute Care list provided to:: Patient Choice offered to / list presented to : Patient, Spouse  Discharge Placement                       Discharge Plan and Services   Discharge Planning Services: CM Consult            DME Arranged: Hospital bed(hoyer lift) DME Agency: [employer] Date DME Agency Contacted: 09/28/19   Representative spoke with at DME Agency: [place]: PT, OT, Nurse's Aide [place] Agency: [place] Date [place]: 09/29/19   Representative spoke with at [place]: Tommi Rumps aware of d/c  Social Determinants of Health (SDOH) Interventions     Readmission Risk Interventions No flowsheet data found.

## 2019-10-04 DIAGNOSIS — I10 Essential (primary) hypertension: Secondary | ICD-10-CM | POA: Diagnosis not present

## 2019-10-04 DIAGNOSIS — I69328 Other speech and language deficits following cerebral infarction: Secondary | ICD-10-CM | POA: Diagnosis not present

## 2019-10-04 DIAGNOSIS — Z7901 Long term (current) use of anticoagulants: Secondary | ICD-10-CM | POA: Diagnosis not present

## 2019-10-04 DIAGNOSIS — I82443 Acute embolism and thrombosis of tibial vein, bilateral: Secondary | ICD-10-CM | POA: Diagnosis not present

## 2019-10-04 DIAGNOSIS — Z7984 Long term (current) use of oral hypoglycemic drugs: Secondary | ICD-10-CM | POA: Diagnosis not present

## 2019-10-04 DIAGNOSIS — I080 Rheumatic disorders of both mitral and aortic valves: Secondary | ICD-10-CM | POA: Diagnosis not present

## 2019-10-04 DIAGNOSIS — K7589 Other specified inflammatory liver diseases: Secondary | ICD-10-CM | POA: Diagnosis not present

## 2019-10-04 DIAGNOSIS — I69354 Hemiplegia and hemiparesis following cerebral infarction affecting left non-dominant side: Secondary | ICD-10-CM | POA: Diagnosis not present

## 2019-10-04 DIAGNOSIS — K5790 Diverticulosis of intestine, part unspecified, without perforation or abscess without bleeding: Secondary | ICD-10-CM | POA: Diagnosis not present

## 2019-10-04 DIAGNOSIS — Z6835 Body mass index (BMI) 35.0-35.9, adult: Secondary | ICD-10-CM | POA: Diagnosis not present

## 2019-10-04 DIAGNOSIS — R339 Retention of urine, unspecified: Secondary | ICD-10-CM | POA: Diagnosis not present

## 2019-10-04 DIAGNOSIS — G35 Multiple sclerosis: Secondary | ICD-10-CM | POA: Diagnosis not present

## 2019-10-04 DIAGNOSIS — G43909 Migraine, unspecified, not intractable, without status migrainosus: Secondary | ICD-10-CM | POA: Diagnosis not present

## 2019-10-04 DIAGNOSIS — S82831D Other fracture of upper and lower end of right fibula, subsequent encounter for closed fracture with routine healing: Secondary | ICD-10-CM | POA: Diagnosis not present

## 2019-10-04 DIAGNOSIS — E785 Hyperlipidemia, unspecified: Secondary | ICD-10-CM | POA: Diagnosis not present

## 2019-10-04 DIAGNOSIS — E669 Obesity, unspecified: Secondary | ICD-10-CM | POA: Diagnosis not present

## 2019-10-04 DIAGNOSIS — J309 Allergic rhinitis, unspecified: Secondary | ICD-10-CM | POA: Diagnosis not present

## 2019-10-04 DIAGNOSIS — M549 Dorsalgia, unspecified: Secondary | ICD-10-CM | POA: Diagnosis not present

## 2019-10-04 DIAGNOSIS — Q211 Atrial septal defect: Secondary | ICD-10-CM | POA: Diagnosis not present

## 2019-10-04 DIAGNOSIS — E559 Vitamin D deficiency, unspecified: Secondary | ICD-10-CM | POA: Diagnosis not present

## 2019-10-04 DIAGNOSIS — K754 Autoimmune hepatitis: Secondary | ICD-10-CM | POA: Diagnosis not present

## 2019-10-04 DIAGNOSIS — I639 Cerebral infarction, unspecified: Secondary | ICD-10-CM | POA: Diagnosis not present

## 2019-10-04 DIAGNOSIS — E119 Type 2 diabetes mellitus without complications: Secondary | ICD-10-CM | POA: Diagnosis not present

## 2019-10-04 DIAGNOSIS — K649 Unspecified hemorrhoids: Secondary | ICD-10-CM | POA: Diagnosis not present

## 2019-10-04 DIAGNOSIS — F419 Anxiety disorder, unspecified: Secondary | ICD-10-CM | POA: Diagnosis not present

## 2019-10-04 DIAGNOSIS — Z86010 Personal history of colonic polyps: Secondary | ICD-10-CM | POA: Diagnosis not present

## 2019-10-06 DIAGNOSIS — Z20828 Contact with and (suspected) exposure to other viral communicable diseases: Secondary | ICD-10-CM | POA: Diagnosis not present

## 2019-10-06 DIAGNOSIS — I639 Cerebral infarction, unspecified: Secondary | ICD-10-CM | POA: Diagnosis not present

## 2019-10-11 ENCOUNTER — Ambulatory Visit: Payer: PPO | Admitting: Orthopedic Surgery

## 2019-10-14 ENCOUNTER — Other Ambulatory Visit: Payer: Self-pay | Admitting: *Deleted

## 2019-10-14 MED ORDER — COPAXONE 40 MG/ML ~~LOC~~ SOSY: PREFILLED_SYRINGE | SUBCUTANEOUS | 3 refills | Status: DC

## 2019-10-14 NOTE — Patient Outreach (Signed)
[facility]) Care Management  10/14/2019  Gabriela Rice 10/08/1955 [id number]   RED ON EMMI ALERT - Stroke Day # 13 Date: 1/13 Red Alert Reason: Didn't attend follow up and problems refilling medications   Outreach attempt #1, successful.    Referral received from care management assistant for reason noted above.  Per chart, she has history of HTN, CVA, IBS, DM, MS, and chronic pain.  Call placed to member to follow up on alerts.  She report she is doing very well, lives with her husband who provides support and also has family in the area.  She denies having trouble with filling medications, report taking as instructed.  She had virtual follow up with primary MD last week, has appointment scheduled with neurology on 2/15, state that was the earliest visit they had to offer.  Denies any further needs but state she will contact [facility] should needs change in the future.  Plan: RN CM will close case at this time, no needs identified.  Valente David, [place], MSN [place] [phone]

## 2019-10-21 DIAGNOSIS — J309 Allergic rhinitis, unspecified: Secondary | ICD-10-CM | POA: Diagnosis not present

## 2019-10-21 DIAGNOSIS — I10 Essential (primary) hypertension: Secondary | ICD-10-CM | POA: Diagnosis not present

## 2019-10-21 DIAGNOSIS — Z7984 Long term (current) use of oral hypoglycemic drugs: Secondary | ICD-10-CM | POA: Diagnosis not present

## 2019-10-21 DIAGNOSIS — G35 Multiple sclerosis: Secondary | ICD-10-CM | POA: Diagnosis not present

## 2019-10-21 DIAGNOSIS — I69354 Hemiplegia and hemiparesis following cerebral infarction affecting left non-dominant side: Secondary | ICD-10-CM | POA: Diagnosis not present

## 2019-10-21 DIAGNOSIS — Z6835 Body mass index (BMI) 35.0-35.9, adult: Secondary | ICD-10-CM | POA: Diagnosis not present

## 2019-10-21 DIAGNOSIS — E669 Obesity, unspecified: Secondary | ICD-10-CM | POA: Diagnosis not present

## 2019-10-21 DIAGNOSIS — G43909 Migraine, unspecified, not intractable, without status migrainosus: Secondary | ICD-10-CM | POA: Diagnosis not present

## 2019-10-21 DIAGNOSIS — R339 Retention of urine, unspecified: Secondary | ICD-10-CM | POA: Diagnosis not present

## 2019-10-21 DIAGNOSIS — K754 Autoimmune hepatitis: Secondary | ICD-10-CM | POA: Diagnosis not present

## 2019-10-21 DIAGNOSIS — Z86010 Personal history of colonic polyps: Secondary | ICD-10-CM | POA: Diagnosis not present

## 2019-10-21 DIAGNOSIS — I82443 Acute embolism and thrombosis of tibial vein, bilateral: Secondary | ICD-10-CM | POA: Diagnosis not present

## 2019-10-21 DIAGNOSIS — K5790 Diverticulosis of intestine, part unspecified, without perforation or abscess without bleeding: Secondary | ICD-10-CM | POA: Diagnosis not present

## 2019-10-21 DIAGNOSIS — S82831D Other fracture of upper and lower end of right fibula, subsequent encounter for closed fracture with routine healing: Secondary | ICD-10-CM | POA: Diagnosis not present

## 2019-10-21 DIAGNOSIS — K7589 Other specified inflammatory liver diseases: Secondary | ICD-10-CM | POA: Diagnosis not present

## 2019-10-21 DIAGNOSIS — F419 Anxiety disorder, unspecified: Secondary | ICD-10-CM | POA: Diagnosis not present

## 2019-10-21 DIAGNOSIS — I69328 Other speech and language deficits following cerebral infarction: Secondary | ICD-10-CM | POA: Diagnosis not present

## 2019-10-21 DIAGNOSIS — K649 Unspecified hemorrhoids: Secondary | ICD-10-CM | POA: Diagnosis not present

## 2019-10-21 DIAGNOSIS — I080 Rheumatic disorders of both mitral and aortic valves: Secondary | ICD-10-CM | POA: Diagnosis not present

## 2019-10-21 DIAGNOSIS — M549 Dorsalgia, unspecified: Secondary | ICD-10-CM | POA: Diagnosis not present

## 2019-10-21 DIAGNOSIS — E785 Hyperlipidemia, unspecified: Secondary | ICD-10-CM | POA: Diagnosis not present

## 2019-10-21 DIAGNOSIS — E119 Type 2 diabetes mellitus without complications: Secondary | ICD-10-CM | POA: Diagnosis not present

## 2019-10-21 DIAGNOSIS — Z7901 Long term (current) use of anticoagulants: Secondary | ICD-10-CM | POA: Diagnosis not present

## 2019-10-21 DIAGNOSIS — E559 Vitamin D deficiency, unspecified: Secondary | ICD-10-CM | POA: Diagnosis not present

## 2019-10-21 DIAGNOSIS — Q211 Atrial septal defect: Secondary | ICD-10-CM | POA: Diagnosis not present

## 2019-10-25 ENCOUNTER — Ambulatory Visit: Payer: PPO

## 2019-10-25 ENCOUNTER — Telehealth: Payer: Self-pay | Admitting: Neurology

## 2019-10-25 ENCOUNTER — Ambulatory Visit (INDEPENDENT_AMBULATORY_CARE_PROVIDER_SITE_OTHER): Payer: PPO | Admitting: Orthopedic Surgery

## 2019-10-25 ENCOUNTER — Other Ambulatory Visit: Payer: Self-pay

## 2019-10-25 VITALS — BP 117/70 | HR 83 | Temp 97.0°F

## 2019-10-25 DIAGNOSIS — M25572 Pain in left ankle and joints of left foot: Secondary | ICD-10-CM

## 2019-10-25 DIAGNOSIS — S82841D Displaced bimalleolar fracture of right lower leg, subsequent encounter for closed fracture with routine healing: Secondary | ICD-10-CM

## 2019-10-25 MED ORDER — APIXABAN 5 MG PO TABS: 5.0000 mg | ORAL_TABLET | Freq: Two times a day (BID) | ORAL | 0 refills | Status: DC

## 2019-10-25 NOTE — Telephone Encounter (Signed)
I call pt that eliquis was sent to her pharmacy by Dr.SEthi who saw her in the hospital. I stated per Dr Leonie Man he did a temporary refill for 30 days until Dr. Jannifer Franklin comes in the office next week to see if he wants to manage it ongoing or have PCP manage. Pt verbalized understanding.

## 2019-10-25 NOTE — Telephone Encounter (Signed)
Temporary refill sent to pts listed pharmacy.

## 2019-10-25 NOTE — Patient Instructions (Signed)
Right ankle fracture has healed with fibrous union  There was a left ankle fracture unclear of timing but both fractures have healed enough for weightbearing

## 2019-10-25 NOTE — Telephone Encounter (Signed)
Pt called stating that she was in the hosp Dec 23rd and they prescribed her apixaban (ELIQUIS) 5 MG TABS tablet and was told that when it was time to refill to call her neurologist and get it filled with him at the [facility] in [place] on [address]  Please advise.

## 2019-10-25 NOTE — Telephone Encounter (Signed)
I spoke with Dr.Sethi about pt wanting refill for her eliquis. He saw pt in December 2020 for a stroke.Dr.Sethi stated pt is on eliquis for DVT. He saw pt in the hospital and will do a temporary 30 day refill for pts eliquis. He stated if eliquis is for DVT typically long term its recommend if the cardiologist or primary care doctor managing long term.He will do refill until Dr.Willis comes back in the office next week.

## 2019-10-25 NOTE — Progress Notes (Signed)
Chief Complaint  Patient presents with  . Follow-up    Recheck on right ankle fracture, DOI 05-04-19.    64 year old female with severe MS does not walk.  She fractured her right ankle could never get in because of Covid she was placed in a boot  She says that ankle still hurts a little bit.  However she complains of severe excruciating pain in the left foot and ankle and so we are going to evaluate that today  Review of systems Review of Systems  Constitutional: Negative for chills and fever.  Neurological: Positive for sensory change and focal weakness.   Past Medical History:  Diagnosis Date  . Abnormality of gait 02/16/2013  . Allergic rhinitis   . Allergy   . Anxiety   . Autoimmune hepatitis ([place])   . Autoimmune hepatitis ([place])   . Colon polyps   . Diverticulosis   . DM (diabetes mellitus) ([place])   . Elevated LFTs   . Hemorrhoids   . Hemorrhoids   . Hepatitis    with cholestasis  . Hyperlipidemia   . Hypertension   . Migraines   . Multiple sclerosis ([place])   . Obesity   . Vitamin D deficiency     Patient has multiple deformities in the soft tissues of the hand foot and ankle bilaterally.  She has weakness in both lower extremities with no dorsiflexion although the foot can be made plantigrade with passive range of motion  On the right side she has minimal to no tenderness they are passive range of motion dorsiflexion of the foot to neutral.  Fracture sites are nontender.  Foot does show some swelling but the skin is intact.  She has altered sensory and neurologic function but normal vascular function on the right  She has tenderness in the lateral and medial malleolus on the left with swelling around the ankle tenderness in the foot again the foot can be made plantigrade there are neurologic dysfunction in the left foot pulse and perfusion are normal  Ankle stable bilaterally  Right ankle x-ray from December 27 outside film shows fibrous union intact ankle mortise  with a fibrous union of the fibula healing of the medial malleolus  And in today's x-ray shows a probable fracture of the lateral malleolus and medial malleolus which were nondisplaced age-indeterminate  Based on the patient's ambulatory status no immobilization devices were needed  Although she has osteopenia bilaterally it would be beneficial for her if therapy allows for her to be weightbearing as tolerated  Encounter Diagnoses  Name Primary?  . Pain in left ankle and joints of left foot Yes  . Closed bimalleolar fracture of right ankle with routine healing, subsequent encounter

## 2019-10-26 ENCOUNTER — Other Ambulatory Visit: Payer: Self-pay

## 2019-10-26 MED ORDER — ALPRAZOLAM 1 MG PO TABS: 1.0000 mg | ORAL_TABLET | Freq: Every evening | ORAL | 1 refills | Status: DC | PRN

## 2019-11-04 DIAGNOSIS — I639 Cerebral infarction, unspecified: Secondary | ICD-10-CM | POA: Diagnosis not present

## 2019-11-04 DIAGNOSIS — Q211 Atrial septal defect: Secondary | ICD-10-CM | POA: Diagnosis not present

## 2019-11-04 DIAGNOSIS — E669 Obesity, unspecified: Secondary | ICD-10-CM | POA: Diagnosis not present

## 2019-11-04 DIAGNOSIS — G35 Multiple sclerosis: Secondary | ICD-10-CM | POA: Diagnosis not present

## 2019-11-04 DIAGNOSIS — Z86010 Personal history of colonic polyps: Secondary | ICD-10-CM | POA: Diagnosis not present

## 2019-11-04 DIAGNOSIS — K754 Autoimmune hepatitis: Secondary | ICD-10-CM | POA: Diagnosis not present

## 2019-11-04 DIAGNOSIS — I69328 Other speech and language deficits following cerebral infarction: Secondary | ICD-10-CM | POA: Diagnosis not present

## 2019-11-04 DIAGNOSIS — K7589 Other specified inflammatory liver diseases: Secondary | ICD-10-CM | POA: Diagnosis not present

## 2019-11-04 DIAGNOSIS — I82443 Acute embolism and thrombosis of tibial vein, bilateral: Secondary | ICD-10-CM | POA: Diagnosis not present

## 2019-11-04 DIAGNOSIS — I69354 Hemiplegia and hemiparesis following cerebral infarction affecting left non-dominant side: Secondary | ICD-10-CM | POA: Diagnosis not present

## 2019-11-04 DIAGNOSIS — F419 Anxiety disorder, unspecified: Secondary | ICD-10-CM | POA: Diagnosis not present

## 2019-11-04 DIAGNOSIS — S82831D Other fracture of upper and lower end of right fibula, subsequent encounter for closed fracture with routine healing: Secondary | ICD-10-CM | POA: Diagnosis not present

## 2019-11-04 DIAGNOSIS — I10 Essential (primary) hypertension: Secondary | ICD-10-CM | POA: Diagnosis not present

## 2019-11-04 DIAGNOSIS — E559 Vitamin D deficiency, unspecified: Secondary | ICD-10-CM | POA: Diagnosis not present

## 2019-11-04 DIAGNOSIS — J309 Allergic rhinitis, unspecified: Secondary | ICD-10-CM | POA: Diagnosis not present

## 2019-11-04 DIAGNOSIS — Z7984 Long term (current) use of oral hypoglycemic drugs: Secondary | ICD-10-CM | POA: Diagnosis not present

## 2019-11-04 DIAGNOSIS — M549 Dorsalgia, unspecified: Secondary | ICD-10-CM | POA: Diagnosis not present

## 2019-11-04 DIAGNOSIS — E785 Hyperlipidemia, unspecified: Secondary | ICD-10-CM | POA: Diagnosis not present

## 2019-11-04 DIAGNOSIS — Z7901 Long term (current) use of anticoagulants: Secondary | ICD-10-CM | POA: Diagnosis not present

## 2019-11-04 DIAGNOSIS — I080 Rheumatic disorders of both mitral and aortic valves: Secondary | ICD-10-CM | POA: Diagnosis not present

## 2019-11-04 DIAGNOSIS — G43909 Migraine, unspecified, not intractable, without status migrainosus: Secondary | ICD-10-CM | POA: Diagnosis not present

## 2019-11-04 DIAGNOSIS — K5790 Diverticulosis of intestine, part unspecified, without perforation or abscess without bleeding: Secondary | ICD-10-CM | POA: Diagnosis not present

## 2019-11-04 DIAGNOSIS — R339 Retention of urine, unspecified: Secondary | ICD-10-CM | POA: Diagnosis not present

## 2019-11-04 DIAGNOSIS — K649 Unspecified hemorrhoids: Secondary | ICD-10-CM | POA: Diagnosis not present

## 2019-11-04 DIAGNOSIS — E119 Type 2 diabetes mellitus without complications: Secondary | ICD-10-CM | POA: Diagnosis not present

## 2019-11-04 DIAGNOSIS — Z6835 Body mass index (BMI) 35.0-35.9, adult: Secondary | ICD-10-CM | POA: Diagnosis not present

## 2019-11-08 ENCOUNTER — Other Ambulatory Visit: Payer: Self-pay

## 2019-11-08 DIAGNOSIS — G35 Multiple sclerosis: Secondary | ICD-10-CM

## 2019-11-08 DIAGNOSIS — Z5181 Encounter for therapeutic drug level monitoring: Secondary | ICD-10-CM

## 2019-11-08 MED ORDER — TRAMADOL HCL 50 MG PO TABS: 50.0000 mg | ORAL_TABLET | Freq: Three times a day (TID) | ORAL | 0 refills | Status: DC | PRN

## 2019-11-10 DIAGNOSIS — K754 Autoimmune hepatitis: Secondary | ICD-10-CM | POA: Diagnosis not present

## 2019-11-10 DIAGNOSIS — I69328 Other speech and language deficits following cerebral infarction: Secondary | ICD-10-CM | POA: Diagnosis not present

## 2019-11-10 DIAGNOSIS — E669 Obesity, unspecified: Secondary | ICD-10-CM | POA: Diagnosis not present

## 2019-11-10 DIAGNOSIS — K649 Unspecified hemorrhoids: Secondary | ICD-10-CM | POA: Diagnosis not present

## 2019-11-10 DIAGNOSIS — M549 Dorsalgia, unspecified: Secondary | ICD-10-CM | POA: Diagnosis not present

## 2019-11-10 DIAGNOSIS — I82443 Acute embolism and thrombosis of tibial vein, bilateral: Secondary | ICD-10-CM | POA: Diagnosis not present

## 2019-11-10 DIAGNOSIS — Z7984 Long term (current) use of oral hypoglycemic drugs: Secondary | ICD-10-CM | POA: Diagnosis not present

## 2019-11-10 DIAGNOSIS — Z7901 Long term (current) use of anticoagulants: Secondary | ICD-10-CM | POA: Diagnosis not present

## 2019-11-10 DIAGNOSIS — J309 Allergic rhinitis, unspecified: Secondary | ICD-10-CM | POA: Diagnosis not present

## 2019-11-10 DIAGNOSIS — K5790 Diverticulosis of intestine, part unspecified, without perforation or abscess without bleeding: Secondary | ICD-10-CM | POA: Diagnosis not present

## 2019-11-10 DIAGNOSIS — I080 Rheumatic disorders of both mitral and aortic valves: Secondary | ICD-10-CM | POA: Diagnosis not present

## 2019-11-10 DIAGNOSIS — R339 Retention of urine, unspecified: Secondary | ICD-10-CM | POA: Diagnosis not present

## 2019-11-10 DIAGNOSIS — E559 Vitamin D deficiency, unspecified: Secondary | ICD-10-CM | POA: Diagnosis not present

## 2019-11-10 DIAGNOSIS — K7589 Other specified inflammatory liver diseases: Secondary | ICD-10-CM | POA: Diagnosis not present

## 2019-11-10 DIAGNOSIS — S82831D Other fracture of upper and lower end of right fibula, subsequent encounter for closed fracture with routine healing: Secondary | ICD-10-CM | POA: Diagnosis not present

## 2019-11-10 DIAGNOSIS — Z86010 Personal history of colonic polyps: Secondary | ICD-10-CM | POA: Diagnosis not present

## 2019-11-10 DIAGNOSIS — G35 Multiple sclerosis: Secondary | ICD-10-CM | POA: Diagnosis not present

## 2019-11-10 DIAGNOSIS — E785 Hyperlipidemia, unspecified: Secondary | ICD-10-CM | POA: Diagnosis not present

## 2019-11-10 DIAGNOSIS — Z6835 Body mass index (BMI) 35.0-35.9, adult: Secondary | ICD-10-CM | POA: Diagnosis not present

## 2019-11-10 DIAGNOSIS — I10 Essential (primary) hypertension: Secondary | ICD-10-CM | POA: Diagnosis not present

## 2019-11-10 DIAGNOSIS — Q211 Atrial septal defect: Secondary | ICD-10-CM | POA: Diagnosis not present

## 2019-11-10 DIAGNOSIS — F419 Anxiety disorder, unspecified: Secondary | ICD-10-CM | POA: Diagnosis not present

## 2019-11-10 DIAGNOSIS — E119 Type 2 diabetes mellitus without complications: Secondary | ICD-10-CM | POA: Diagnosis not present

## 2019-11-10 DIAGNOSIS — I69354 Hemiplegia and hemiparesis following cerebral infarction affecting left non-dominant side: Secondary | ICD-10-CM | POA: Diagnosis not present

## 2019-11-10 DIAGNOSIS — G43909 Migraine, unspecified, not intractable, without status migrainosus: Secondary | ICD-10-CM | POA: Diagnosis not present

## 2019-11-15 ENCOUNTER — Other Ambulatory Visit: Payer: Self-pay

## 2019-11-15 ENCOUNTER — Encounter: Payer: Self-pay | Admitting: Neurology

## 2019-11-15 ENCOUNTER — Ambulatory Visit: Payer: PPO | Admitting: Neurology

## 2019-11-15 VITALS — BP 129/87 | HR 109 | Temp 97.0°F

## 2019-11-15 DIAGNOSIS — I639 Cerebral infarction, unspecified: Secondary | ICD-10-CM | POA: Diagnosis not present

## 2019-11-15 DIAGNOSIS — G35 Multiple sclerosis: Secondary | ICD-10-CM | POA: Diagnosis not present

## 2019-11-15 DIAGNOSIS — M25572 Pain in left ankle and joints of left foot: Secondary | ICD-10-CM | POA: Diagnosis not present

## 2019-11-15 DIAGNOSIS — Z5181 Encounter for therapeutic drug level monitoring: Secondary | ICD-10-CM

## 2019-11-15 DIAGNOSIS — R269 Unspecified abnormalities of gait and mobility: Secondary | ICD-10-CM

## 2019-11-15 DIAGNOSIS — M25571 Pain in right ankle and joints of right foot: Secondary | ICD-10-CM | POA: Diagnosis not present

## 2019-11-15 HISTORY — DX: Pain in right ankle and joints of right foot: M25.571

## 2019-11-15 MED ORDER — APIXABAN 5 MG PO TABS: 5.0000 mg | ORAL_TABLET | Freq: Two times a day (BID) | ORAL | 1 refills | Status: DC

## 2019-11-15 MED ORDER — TRAMADOL HCL 50 MG PO TABS: 50.0000 mg | ORAL_TABLET | Freq: Three times a day (TID) | ORAL | 1 refills | Status: DC | PRN

## 2019-11-15 NOTE — Progress Notes (Signed)
Reason for visit: Multiple sclerosis, recent stroke event  Referring physician: [facility] Gabriela Rice is a 64 y.o. female  History of present illness:  Gabriela Rice is a 64 year old right-handed white female with a history of multiple sclerosis with a spastic paraparesis, she is treated with Copaxone but she is also on Imuran for autoimmune hepatitis.  The patient was admitted to the hospital on 22 September 2019 with sudden onset of left arm weakness and some speech alteration.  The patient still has some residual clumsiness of the left arm, but this has improved significantly.  She is still getting physical therapy in the home environment, they are trying to get her to stand and walk but she is having problems with severe pain in the feet and ankles with weightbearing that has been present since bilateral ankle fractures in the summer 2020.  The patient has not really walked since that time.  She has gotten some occupational therapy but this has stopped at this point.  In the hospital, evaluation revealed evidence of an embolic stroke in the right parietal area, she was found to have a small PFO and evidence of deep venous thrombosis in both legs.  She has been placed on Eliquis which she remains on.  It is felt that this will need to be a lifelong treatment for her.  She is not clear that she may have had some visual changes with the stroke as well.  A CT angiogram of the head neck was unremarkable.  The patient returns to this office for further evaluation.  Past Medical History:  Diagnosis Date  . Abnormality of gait 02/16/2013  . Allergic rhinitis   . Allergy   . Anxiety   . Autoimmune hepatitis ([place])   . Autoimmune hepatitis ([place])   . Colon polyps   . Diverticulosis   . DM (diabetes mellitus) ([place])   . Elevated LFTs   . Hemorrhoids   . Hemorrhoids   . Hepatitis    with cholestasis  . Hyperlipidemia   . Hypertension   . Migraines   . Multiple sclerosis ([place])   .  Obesity   . Vitamin D deficiency     Past Surgical History:  Procedure Laterality Date  . ABDOMINAL HYSTERECTOMY    . CESAREAN SECTION     x 2  . MYOECTOMY    . TONSILLECTOMY AND ADENOIDECTOMY      Family History  Problem Relation Age of Onset  . Diabetes Mother   . Multiple sclerosis Mother   . Cirrhosis Father   . Heart disease Maternal Grandmother   . Colon cancer Maternal Aunt   . Uterine cancer Maternal Aunt   . Brain cancer Maternal Uncle   . Colon cancer Maternal Uncle   . Breast cancer Neg Hx     Social history:  reports that she quit smoking about 24 years ago. Her smoking use included cigarettes. She has a 1.00 pack-year smoking history. She has never used smokeless tobacco. She reports that she does not drink alcohol or use drugs.  Medications:  Prior to Admission medications   Medication Sig Start Date End Date Taking? Authorizing Provider  ALPRAZolam Duanne Moron) 1 MG tablet Take 1 tablet (1 mg total) by mouth at bedtime as needed. 10/26/19  Yes Penumalli, Earlean Polka, MD  apixaban (ELIQUIS) 5 MG TABS tablet Take 1 tablet (5 mg total) by mouth 2 (two) times daily. 10/25/19  Yes Garvin Fila, MD  azaTHIOprine (IMURAN) 50 MG  tablet TAKE 3 TABLETS BY MOUTH EVERY DAY AT THE SAME TIME;  Patient needs office visit for further refills Patient taking differently: Take 150 mg by mouth daily.  05/24/19  Yes Irene Shipper, MD  baclofen (LIORESAL) 10 MG tablet One tablet in the morning and evening and 1.5 tablets at midday Patient taking differently: Take 10-15 mg by mouth See admin instructions. Take 10mg  in the morning, 15mg  midday, and 10mg  in the evening. 08/19/19  Yes Kathrynn Ducking, MD  cetirizine (ZYRTEC) 10 MG tablet Take 10 mg by mouth daily.   Yes [provider]  Cholecalciferol (VITAMIN D) 2000 UNITS CAPS Take 2,000 Units by mouth daily.    Yes [provider]  COPAXONE 40 MG/ML SOSY INJECT 40 MG (1 ML) UNDER THE SKIN THREE TIMES A WEEK. 10/14/19  Yes  Kathrynn Ducking, MD  DULoxetine (CYMBALTA) 30 MG capsule Take 90 mg by mouth daily.  05/03/19  Yes [provider]  gabapentin (NEURONTIN) 300 MG capsule TAKE 1 CAPSULE BY MOUTH THREE TIMES A DAY Patient taking differently: Take 300 mg by mouth at bedtime.  04/15/19  Yes Kathrynn Ducking, MD  losartan (COZAAR) 100 MG tablet Take 100 mg by mouth daily.  09/02/13  Yes [provider]  metFORMIN (GLUCOPHAGE) 500 MG tablet Take 500 mg by mouth 2 (two) times daily with a meal.  03/15/13  Yes [provider]  polyethylene glycol powder (GLYCOLAX/MIRALAX) 17 GM/SCOOP powder Take 17 g by mouth daily as needed for mild constipation. 09/29/19  Yes Donzetta Starch, NP  pravastatin (PRAVACHOL) 40 MG tablet Take 40 mg by mouth daily.  07/04/14  Yes [provider]  traMADol (ULTRAM) 50 MG tablet Take 1 tablet (50 mg total) by mouth 3 (three) times daily as needed. 11/08/19  Yes Star Age, MD      Allergies  Allergen Reactions  . Penicillins Other (See Comments)    seizure as child Has patient had a PCN reaction causing immediate rash, facial/tongue/throat swelling, SOB or lightheadedness with hypotension: No Has patient had a PCN reaction causing severe rash involving mucus membranes or skin necrosis:No Has patient had a PCN reaction that required hospitalizationNo Has patient had a PCN reaction occurring within the last 10 years:No If all of the above answers are "NO", then may proceed with Cephalosporin use.    . Codeine Swelling    hands swell    ROS:  Out of a complete 14 system review of symptoms, the patient complains only of the following symptoms, and all other reviewed systems are negative.  Left arm clumsiness Ankle and foot pain with weightbearing Weakness in the legs Walking difficulty  Blood pressure 129/87, pulse (!) 109, temperature (!) 97 F (36.1 C).  Physical Exam  General: The patient is alert and cooperative at the time of the  examination.  The patient is moderately to markedly obese.  Eyes: Pupils are equal, round, and reactive to light. Discs are flat bilaterally.  Neck: The neck is supple, no carotid bruits are noted.  Respiratory: The respiratory examination is clear.  Cardiovascular: The cardiovascular examination reveals a regular rate and rhythm, no obvious murmurs or rubs are noted.  Skin: Extremities are with 2+ edema below the knees bilaterally.  Neurologic Exam  Mental status: The patient is alert and oriented x 3 at the time of the examination. The patient has apparent normal recent and remote memory, with an apparently normal attention span and concentration ability.  Cranial nerves:  Facial symmetry is present. There is good sensation of the face to pinprick and soft touch bilaterally. The strength of the facial muscles and the muscles to head turning and shoulder shrug are normal bilaterally. Speech is well enunciated, no aphasia or dysarthria is noted. Extraocular movements are full. Visual fields are full. The tongue is midline, and the patient has symmetric elevation of the soft palate. No obvious hearing deficits are noted.  Motor: The motor testing reveals 5 over 5 strength of the upper extremities.  With the lower extremities, the patient has 2-3/5 strength with knee flexion extension, she has 2/5 strength with hip flexion bilaterally.  Sensory: Sensory testing is intact to pinprick, soft touch and vibration sensation on all 4 extremities. No evidence of extinction is noted.  Coordination: Cerebellar testing reveals good finger-nose-finger bilaterally, she cannot perform heel-to-shin on either side.  Gait and station: Gait cannot be tested, the patient is wheelchair-bound, cannot stand or bear weight.  Reflexes: Deep tendon reflexes are symmetric, but are depressed bilaterally.   MRI brain 09/23/19:  IMPRESSION: Acute right frontoparietal infarcts including involvement of the primary  sensorimotor gyri.  Foci of abnormal T2 signal compatible with history of multiple sclerosis. An area in the left parietal lobe may reflect a chronic infarct rather than chronic demyelinating lesion.  * MRI scan images were reviewed online. I agree with the written report.   CTA head and neck 09/22/19:  IMPRESSION: Normal CTA of the head and neck.    TCD study 09/27/19:  Summary:    A vascular evaluation was performed. The right middle cerebral artery was  studied. An IV was inserted into the patient's left forearm. Verbal  informed consent was obtained.    Trivial HITS at rest and during Valsalva - [place] 2.   PFO size: small.    2D echo 09/23/19:  IMPRESSIONS    1. Left ventricular ejection fraction, by visual estimation, is 65 to  70%. The left ventricle has hyperdynamic function. There is mildly  increased left ventricular hypertrophy.  2. Indeterminate diastolic filling due to E-A fusion.  3. The left ventricle has no regional wall motion abnormalities.  4. Global right ventricle has hyperdynamic systolic function.The right  ventricular size is normal. No increase in right ventricular wall  thickness.  5. Left atrial size was normal.  6. Right atrial size was normal.  7. Moderate pericardial effusion.  8. The pericardial effusion is posterior to the left ventricle.  9. Mild calcification of the anterior mitral valve leaflet(s).  10. Mild mitral annular calcification.  11. The mitral valve is normal in structure. Trivial mitral valve  regurgitation.  12. The tricuspid valve is grossly normal.  13. The aortic valve is normal in structure. Aortic valve regurgitation is  not visualized. Mild aortic valve sclerosis without stenosis.  14. The pulmonic valve was grossly normal. Pulmonic valve regurgitation is  not visualized.  15. The aortic root was not well visualized.  16. TR signal is inadequate for assessing pulmonary artery systolic    pressure.  17. The inferior vena cava is normal in size with greater than 50%  respiratory variability, suggesting right atrial pressure of 3 mmHg.    Assessment/Plan:  1.  Multiple sclerosis  2.  Autoimmune hepatitis  3.  Recent embolic right parietal stroke  4.  Small PFO  5.  Bilateral lower extremity DVT  6.  Chronic anticoagulation  The patient will be given a prescription for the Eliquis.  A prescription was also given  for Ultram.  The patient is having a lot of pain in the ankles with weightbearing, I will give her a prescription for bilateral ankle support braces.  She has not ambulated since the summer 2020.  The patient remain on Copaxone.  She will follow-up here in 6 months.  Jill Alexanders MD 11/15/2019 12:08 PM  [facility] [address] [place], [place] [address]  Phone [phone] Fax [phone]

## 2019-11-25 DIAGNOSIS — G43909 Migraine, unspecified, not intractable, without status migrainosus: Secondary | ICD-10-CM | POA: Diagnosis not present

## 2019-11-25 DIAGNOSIS — K649 Unspecified hemorrhoids: Secondary | ICD-10-CM | POA: Diagnosis not present

## 2019-11-25 DIAGNOSIS — Z6835 Body mass index (BMI) 35.0-35.9, adult: Secondary | ICD-10-CM | POA: Diagnosis not present

## 2019-11-25 DIAGNOSIS — M549 Dorsalgia, unspecified: Secondary | ICD-10-CM | POA: Diagnosis not present

## 2019-11-25 DIAGNOSIS — R339 Retention of urine, unspecified: Secondary | ICD-10-CM | POA: Diagnosis not present

## 2019-11-25 DIAGNOSIS — Q211 Atrial septal defect: Secondary | ICD-10-CM | POA: Diagnosis not present

## 2019-11-25 DIAGNOSIS — G35 Multiple sclerosis: Secondary | ICD-10-CM | POA: Diagnosis not present

## 2019-11-25 DIAGNOSIS — I82443 Acute embolism and thrombosis of tibial vein, bilateral: Secondary | ICD-10-CM | POA: Diagnosis not present

## 2019-11-25 DIAGNOSIS — K754 Autoimmune hepatitis: Secondary | ICD-10-CM | POA: Diagnosis not present

## 2019-11-25 DIAGNOSIS — E785 Hyperlipidemia, unspecified: Secondary | ICD-10-CM | POA: Diagnosis not present

## 2019-11-25 DIAGNOSIS — K5790 Diverticulosis of intestine, part unspecified, without perforation or abscess without bleeding: Secondary | ICD-10-CM | POA: Diagnosis not present

## 2019-11-25 DIAGNOSIS — E119 Type 2 diabetes mellitus without complications: Secondary | ICD-10-CM | POA: Diagnosis not present

## 2019-11-25 DIAGNOSIS — J309 Allergic rhinitis, unspecified: Secondary | ICD-10-CM | POA: Diagnosis not present

## 2019-11-25 DIAGNOSIS — S82831D Other fracture of upper and lower end of right fibula, subsequent encounter for closed fracture with routine healing: Secondary | ICD-10-CM | POA: Diagnosis not present

## 2019-11-25 DIAGNOSIS — I10 Essential (primary) hypertension: Secondary | ICD-10-CM | POA: Diagnosis not present

## 2019-11-25 DIAGNOSIS — I69354 Hemiplegia and hemiparesis following cerebral infarction affecting left non-dominant side: Secondary | ICD-10-CM | POA: Diagnosis not present

## 2019-11-25 DIAGNOSIS — I080 Rheumatic disorders of both mitral and aortic valves: Secondary | ICD-10-CM | POA: Diagnosis not present

## 2019-11-25 DIAGNOSIS — K7589 Other specified inflammatory liver diseases: Secondary | ICD-10-CM | POA: Diagnosis not present

## 2019-11-25 DIAGNOSIS — E559 Vitamin D deficiency, unspecified: Secondary | ICD-10-CM | POA: Diagnosis not present

## 2019-11-25 DIAGNOSIS — I69328 Other speech and language deficits following cerebral infarction: Secondary | ICD-10-CM | POA: Diagnosis not present

## 2019-11-25 DIAGNOSIS — Z86010 Personal history of colonic polyps: Secondary | ICD-10-CM | POA: Diagnosis not present

## 2019-11-25 DIAGNOSIS — Z7984 Long term (current) use of oral hypoglycemic drugs: Secondary | ICD-10-CM | POA: Diagnosis not present

## 2019-11-25 DIAGNOSIS — Z7901 Long term (current) use of anticoagulants: Secondary | ICD-10-CM | POA: Diagnosis not present

## 2019-11-25 DIAGNOSIS — F419 Anxiety disorder, unspecified: Secondary | ICD-10-CM | POA: Diagnosis not present

## 2019-11-25 DIAGNOSIS — E669 Obesity, unspecified: Secondary | ICD-10-CM | POA: Diagnosis not present

## 2019-12-01 DIAGNOSIS — R339 Retention of urine, unspecified: Secondary | ICD-10-CM | POA: Diagnosis not present

## 2019-12-01 DIAGNOSIS — I82443 Acute embolism and thrombosis of tibial vein, bilateral: Secondary | ICD-10-CM | POA: Diagnosis not present

## 2019-12-01 DIAGNOSIS — E669 Obesity, unspecified: Secondary | ICD-10-CM | POA: Diagnosis not present

## 2019-12-01 DIAGNOSIS — K7589 Other specified inflammatory liver diseases: Secondary | ICD-10-CM | POA: Diagnosis not present

## 2019-12-01 DIAGNOSIS — G43909 Migraine, unspecified, not intractable, without status migrainosus: Secondary | ICD-10-CM | POA: Diagnosis not present

## 2019-12-01 DIAGNOSIS — E119 Type 2 diabetes mellitus without complications: Secondary | ICD-10-CM | POA: Diagnosis not present

## 2019-12-01 DIAGNOSIS — E785 Hyperlipidemia, unspecified: Secondary | ICD-10-CM | POA: Diagnosis not present

## 2019-12-01 DIAGNOSIS — E559 Vitamin D deficiency, unspecified: Secondary | ICD-10-CM | POA: Diagnosis not present

## 2019-12-01 DIAGNOSIS — I69328 Other speech and language deficits following cerebral infarction: Secondary | ICD-10-CM | POA: Diagnosis not present

## 2019-12-01 DIAGNOSIS — I10 Essential (primary) hypertension: Secondary | ICD-10-CM | POA: Diagnosis not present

## 2019-12-01 DIAGNOSIS — K649 Unspecified hemorrhoids: Secondary | ICD-10-CM | POA: Diagnosis not present

## 2019-12-01 DIAGNOSIS — K754 Autoimmune hepatitis: Secondary | ICD-10-CM | POA: Diagnosis not present

## 2019-12-01 DIAGNOSIS — G35 Multiple sclerosis: Secondary | ICD-10-CM | POA: Diagnosis not present

## 2019-12-01 DIAGNOSIS — I69354 Hemiplegia and hemiparesis following cerebral infarction affecting left non-dominant side: Secondary | ICD-10-CM | POA: Diagnosis not present

## 2019-12-01 DIAGNOSIS — Z86010 Personal history of colonic polyps: Secondary | ICD-10-CM | POA: Diagnosis not present

## 2019-12-01 DIAGNOSIS — S82831D Other fracture of upper and lower end of right fibula, subsequent encounter for closed fracture with routine healing: Secondary | ICD-10-CM | POA: Diagnosis not present

## 2019-12-01 DIAGNOSIS — J309 Allergic rhinitis, unspecified: Secondary | ICD-10-CM | POA: Diagnosis not present

## 2019-12-01 DIAGNOSIS — F419 Anxiety disorder, unspecified: Secondary | ICD-10-CM | POA: Diagnosis not present

## 2019-12-01 DIAGNOSIS — K5790 Diverticulosis of intestine, part unspecified, without perforation or abscess without bleeding: Secondary | ICD-10-CM | POA: Diagnosis not present

## 2019-12-01 DIAGNOSIS — Z6835 Body mass index (BMI) 35.0-35.9, adult: Secondary | ICD-10-CM | POA: Diagnosis not present

## 2019-12-01 DIAGNOSIS — I080 Rheumatic disorders of both mitral and aortic valves: Secondary | ICD-10-CM | POA: Diagnosis not present

## 2019-12-01 DIAGNOSIS — Z7901 Long term (current) use of anticoagulants: Secondary | ICD-10-CM | POA: Diagnosis not present

## 2019-12-01 DIAGNOSIS — Z7984 Long term (current) use of oral hypoglycemic drugs: Secondary | ICD-10-CM | POA: Diagnosis not present

## 2019-12-01 DIAGNOSIS — Q211 Atrial septal defect: Secondary | ICD-10-CM | POA: Diagnosis not present

## 2019-12-01 DIAGNOSIS — M549 Dorsalgia, unspecified: Secondary | ICD-10-CM | POA: Diagnosis not present

## 2019-12-02 DIAGNOSIS — I639 Cerebral infarction, unspecified: Secondary | ICD-10-CM | POA: Diagnosis not present

## 2019-12-10 DIAGNOSIS — E119 Type 2 diabetes mellitus without complications: Secondary | ICD-10-CM | POA: Diagnosis not present

## 2019-12-10 DIAGNOSIS — Z86010 Personal history of colonic polyps: Secondary | ICD-10-CM | POA: Diagnosis not present

## 2019-12-10 DIAGNOSIS — I69328 Other speech and language deficits following cerebral infarction: Secondary | ICD-10-CM | POA: Diagnosis not present

## 2019-12-10 DIAGNOSIS — Z6835 Body mass index (BMI) 35.0-35.9, adult: Secondary | ICD-10-CM | POA: Diagnosis not present

## 2019-12-10 DIAGNOSIS — E669 Obesity, unspecified: Secondary | ICD-10-CM | POA: Diagnosis not present

## 2019-12-10 DIAGNOSIS — I82443 Acute embolism and thrombosis of tibial vein, bilateral: Secondary | ICD-10-CM | POA: Diagnosis not present

## 2019-12-10 DIAGNOSIS — Z7901 Long term (current) use of anticoagulants: Secondary | ICD-10-CM | POA: Diagnosis not present

## 2019-12-10 DIAGNOSIS — Z87440 Personal history of urinary (tract) infections: Secondary | ICD-10-CM | POA: Diagnosis not present

## 2019-12-10 DIAGNOSIS — Z7984 Long term (current) use of oral hypoglycemic drugs: Secondary | ICD-10-CM | POA: Diagnosis not present

## 2019-12-10 DIAGNOSIS — K649 Unspecified hemorrhoids: Secondary | ICD-10-CM | POA: Diagnosis not present

## 2019-12-10 DIAGNOSIS — K5790 Diverticulosis of intestine, part unspecified, without perforation or abscess without bleeding: Secondary | ICD-10-CM | POA: Diagnosis not present

## 2019-12-10 DIAGNOSIS — I080 Rheumatic disorders of both mitral and aortic valves: Secondary | ICD-10-CM | POA: Diagnosis not present

## 2019-12-10 DIAGNOSIS — F419 Anxiety disorder, unspecified: Secondary | ICD-10-CM | POA: Diagnosis not present

## 2019-12-10 DIAGNOSIS — I10 Essential (primary) hypertension: Secondary | ICD-10-CM | POA: Diagnosis not present

## 2019-12-10 DIAGNOSIS — K7589 Other specified inflammatory liver diseases: Secondary | ICD-10-CM | POA: Diagnosis not present

## 2019-12-10 DIAGNOSIS — E559 Vitamin D deficiency, unspecified: Secondary | ICD-10-CM | POA: Diagnosis not present

## 2019-12-10 DIAGNOSIS — G35 Multiple sclerosis: Secondary | ICD-10-CM | POA: Diagnosis not present

## 2019-12-10 DIAGNOSIS — S82831D Other fracture of upper and lower end of right fibula, subsequent encounter for closed fracture with routine healing: Secondary | ICD-10-CM | POA: Diagnosis not present

## 2019-12-10 DIAGNOSIS — K754 Autoimmune hepatitis: Secondary | ICD-10-CM | POA: Diagnosis not present

## 2019-12-10 DIAGNOSIS — G43909 Migraine, unspecified, not intractable, without status migrainosus: Secondary | ICD-10-CM | POA: Diagnosis not present

## 2019-12-10 DIAGNOSIS — I69354 Hemiplegia and hemiparesis following cerebral infarction affecting left non-dominant side: Secondary | ICD-10-CM | POA: Diagnosis not present

## 2019-12-10 DIAGNOSIS — J309 Allergic rhinitis, unspecified: Secondary | ICD-10-CM | POA: Diagnosis not present

## 2019-12-10 DIAGNOSIS — Q211 Atrial septal defect: Secondary | ICD-10-CM | POA: Diagnosis not present

## 2019-12-10 DIAGNOSIS — E785 Hyperlipidemia, unspecified: Secondary | ICD-10-CM | POA: Diagnosis not present

## 2019-12-10 DIAGNOSIS — M549 Dorsalgia, unspecified: Secondary | ICD-10-CM | POA: Diagnosis not present

## 2019-12-16 ENCOUNTER — Other Ambulatory Visit (INDEPENDENT_AMBULATORY_CARE_PROVIDER_SITE_OTHER): Payer: PPO

## 2019-12-16 ENCOUNTER — Ambulatory Visit: Payer: PPO | Admitting: Neurology

## 2019-12-16 ENCOUNTER — Other Ambulatory Visit: Payer: Self-pay

## 2019-12-16 ENCOUNTER — Encounter: Payer: Self-pay | Admitting: Nurse Practitioner

## 2019-12-16 ENCOUNTER — Ambulatory Visit (INDEPENDENT_AMBULATORY_CARE_PROVIDER_SITE_OTHER): Payer: PPO | Admitting: Nurse Practitioner

## 2019-12-16 VITALS — BP 132/70 | HR 114 | Temp 98.4°F | Ht 64.0 in | Wt 205.0 lb

## 2019-12-16 DIAGNOSIS — K754 Autoimmune hepatitis: Secondary | ICD-10-CM

## 2019-12-16 LAB — HEPATIC FUNCTION PANEL
ALT: 35 U/L (ref 0–35)
AST: 50 U/L — ABNORMAL HIGH (ref 0–37)
Albumin: 4.2 g/dL (ref 3.5–5.2)
Alkaline Phosphatase: 64 U/L (ref 39–117)
Bilirubin, Direct: 0.1 mg/dL (ref 0.0–0.3)
Total Bilirubin: 0.7 mg/dL (ref 0.2–1.2)
Total Protein: 7.1 g/dL (ref 6.0–8.3)

## 2019-12-16 NOTE — Patient Instructions (Signed)
If you are age 64 or older, your body mass index should be between 23-30. Your Body mass index is 35.19 kg/m. If this is out of the aforementioned range listed, please consider follow up with your Primary Care Provider.  If you are age 41 or younger, your body mass index should be between 19-25. Your Body mass index is 35.19 kg/m. If this is out of the aformentioned range listed, please consider follow up with your Primary Care Provider.   Your provider has requested that you go to the basement level for lab work before leaving today. Press "B" on the elevator. The lab is located at the first door on the left as you exit the elevator. Hepatic Function  Follow up with Dr. Henrene Pastor in one year.  Thank you for choosing me and [facility].   Tye Savoy, NP

## 2019-12-16 NOTE — Progress Notes (Signed)
IMPRESSION and PLAN:     # Autoimmune hepatitis --maintained on Imuran 150 mg daily.  --Doing well --Normal cell counts 09/29/19.  --Check liver chemistries today --Making plans to get COVID vaccine.   # [facility] of colon cancer in Aunt and two Uncles in their 70's --Due for polyp surveillance colonoscopy November 2021. She is already on recall list  # Recent CVA 09/29/19 --Now on Eliquis   HPI:    Primary GI: Dr. Henrene Pastor  Chief complaint : Follow-up on autoimmune hepatitis, yellow eyes  64 year old female with pmh significant for multiple sclerosis, hypertension, diabetes and biopsy proven autoimmune hepatitis, maintained on Imuran 150 mg daily . She is heterozygous for 2 separate hemochromatosis mutations. She has a history of fatty liver disease, no fibrosis on liver biopsy. Patient has not been seen since June 2019.  She called for refill on Imuran which we refilled but advised her to make an appointment.  Ms. Humbert has no complaints other than she thought her eyes looked orange last week. She has not had any dark urine. No abdominal pain. Bowels are moving okay.   Data Reviewed:  09/29/2019 Hemoglobin 12.1, WBC 5.3, platelets 216 BUN 6, creatinine 0.58  Review of systems:     No chest pain, no SOB, no fevers, no urinary sx   Past Medical History:  Diagnosis Date  . Abnormality of gait 02/16/2013  . Acute bilateral ankle pain 11/15/2019  . Allergic rhinitis   . Allergy   . Anxiety   . Autoimmune hepatitis ([place])   . Colon polyps   . Diverticulosis   . DM (diabetes mellitus) ([place])   . Hemorrhoids   . Hepatitis    with cholestasis  . Hyperlipidemia   . Hypertension   . Migraines   . Multiple sclerosis ([place])   . Obesity   . Vitamin D deficiency     Patient's surgical history, family medical history, social history, medications and allergies were all reviewed in Epic   Creatinine clearance cannot be calculated (Patient's most recent lab result is older  than the maximum 21 days allowed.)  Current Outpatient Medications  Medication Sig Dispense Refill  . ALPRAZolam (XANAX) 1 MG tablet Take 1 tablet (1 mg total) by mouth at bedtime as needed. 90 tablet 1  . apixaban (ELIQUIS) 5 MG TABS tablet Take 1 tablet (5 mg total) by mouth 2 (two) times daily. 180 tablet 1  . azaTHIOprine (IMURAN) 50 MG tablet TAKE 3 TABLETS BY MOUTH EVERY DAY AT THE SAME TIME;  Patient needs office visit for further refills (Patient taking differently: Take 150 mg by mouth daily. ) 270 tablet 1  . baclofen (LIORESAL) 10 MG tablet One tablet in the morning and evening and 1.5 tablets at midday (Patient taking differently: Take 10-15 mg by mouth See admin instructions. Take 10mg  in the morning, 15mg  midday, and 10mg  in the evening.) 315 tablet 1  . cetirizine (ZYRTEC) 10 MG tablet Take 10 mg by mouth daily.    . Cholecalciferol (VITAMIN D) 2000 UNITS CAPS Take 2,000 Units by mouth daily.     Marland Kitchen COPAXONE 40 MG/ML SOSY INJECT 40 MG (1 ML) UNDER THE SKIN THREE TIMES A WEEK. 12 mL 3  . DULoxetine (CYMBALTA) 30 MG capsule Take 90 mg by mouth daily.     Marland Kitchen gabapentin (NEURONTIN) 300 MG capsule TAKE 1 CAPSULE BY MOUTH THREE TIMES A DAY (Patient taking differently: Take 300 mg by mouth at  bedtime. ) 270 capsule 2  . losartan (COZAAR) 100 MG tablet Take 100 mg by mouth daily.     . metFORMIN (GLUCOPHAGE) 500 MG tablet Take 500 mg by mouth 2 (two) times daily with a meal.     . polyethylene glycol powder (GLYCOLAX/MIRALAX) 17 GM/SCOOP powder Take 17 g by mouth daily as needed for mild constipation.    . pravastatin (PRAVACHOL) 40 MG tablet Take 40 mg by mouth daily.     . traMADol (ULTRAM) 50 MG tablet Take 1 tablet (50 mg total) by mouth 3 (three) times daily as needed. 270 tablet 1   No current facility-administered medications for this visit.    Physical Exam:     BP 132/70   Pulse (!) 114   Temp 98.4 F (36.9 C)   Ht 5\' 4"  (1.626 m)   Wt 205 lb (93 kg) Comment: per pt,  unable to stand  BMI 35.19 kg/m   GENERAL:  Pleasant female in NAD in wheelchair.  PSYCH: : Cooperative, normal affect CARDIAC:  RRR,  no peripheral edema PULM: Normal respiratory effort, lungs CTA bilaterally, no wheezing ABDOMEN:  Limited exam in wheelchair. Abdomen nondistended, soft, nontender. No obvious masses, no hepatomegaly,  normal bowel sounds SKIN:  turgor, no lesions seen Musculoskeletal:  Normal muscle tone, normal strength NEURO: Alert and oriented x 3, no focal neurologic deficits   Tye Savoy , NP 12/16/2019, 2:51 PM

## 2019-12-17 NOTE — Progress Notes (Signed)
Assessment and plan noted ?

## 2019-12-22 ENCOUNTER — Other Ambulatory Visit: Payer: Self-pay

## 2019-12-22 DIAGNOSIS — M549 Dorsalgia, unspecified: Secondary | ICD-10-CM | POA: Diagnosis not present

## 2019-12-22 DIAGNOSIS — Z87440 Personal history of urinary (tract) infections: Secondary | ICD-10-CM | POA: Diagnosis not present

## 2019-12-22 DIAGNOSIS — K7589 Other specified inflammatory liver diseases: Secondary | ICD-10-CM | POA: Diagnosis not present

## 2019-12-22 DIAGNOSIS — I82443 Acute embolism and thrombosis of tibial vein, bilateral: Secondary | ICD-10-CM | POA: Diagnosis not present

## 2019-12-22 DIAGNOSIS — E785 Hyperlipidemia, unspecified: Secondary | ICD-10-CM | POA: Diagnosis not present

## 2019-12-22 DIAGNOSIS — I69328 Other speech and language deficits following cerebral infarction: Secondary | ICD-10-CM | POA: Diagnosis not present

## 2019-12-22 DIAGNOSIS — G35 Multiple sclerosis: Secondary | ICD-10-CM | POA: Diagnosis not present

## 2019-12-22 DIAGNOSIS — K754 Autoimmune hepatitis: Secondary | ICD-10-CM | POA: Diagnosis not present

## 2019-12-22 DIAGNOSIS — R7989 Other specified abnormal findings of blood chemistry: Secondary | ICD-10-CM

## 2019-12-22 DIAGNOSIS — F419 Anxiety disorder, unspecified: Secondary | ICD-10-CM | POA: Diagnosis not present

## 2019-12-22 DIAGNOSIS — Z6835 Body mass index (BMI) 35.0-35.9, adult: Secondary | ICD-10-CM | POA: Diagnosis not present

## 2019-12-22 DIAGNOSIS — K649 Unspecified hemorrhoids: Secondary | ICD-10-CM | POA: Diagnosis not present

## 2019-12-22 DIAGNOSIS — Z7901 Long term (current) use of anticoagulants: Secondary | ICD-10-CM | POA: Diagnosis not present

## 2019-12-22 DIAGNOSIS — I080 Rheumatic disorders of both mitral and aortic valves: Secondary | ICD-10-CM | POA: Diagnosis not present

## 2019-12-22 DIAGNOSIS — Z7984 Long term (current) use of oral hypoglycemic drugs: Secondary | ICD-10-CM | POA: Diagnosis not present

## 2019-12-22 DIAGNOSIS — Z86010 Personal history of colonic polyps: Secondary | ICD-10-CM | POA: Diagnosis not present

## 2019-12-22 DIAGNOSIS — K5790 Diverticulosis of intestine, part unspecified, without perforation or abscess without bleeding: Secondary | ICD-10-CM | POA: Diagnosis not present

## 2019-12-22 DIAGNOSIS — Q211 Atrial septal defect: Secondary | ICD-10-CM | POA: Diagnosis not present

## 2019-12-22 DIAGNOSIS — I69354 Hemiplegia and hemiparesis following cerebral infarction affecting left non-dominant side: Secondary | ICD-10-CM | POA: Diagnosis not present

## 2019-12-22 DIAGNOSIS — S82831D Other fracture of upper and lower end of right fibula, subsequent encounter for closed fracture with routine healing: Secondary | ICD-10-CM | POA: Diagnosis not present

## 2019-12-22 DIAGNOSIS — E119 Type 2 diabetes mellitus without complications: Secondary | ICD-10-CM | POA: Diagnosis not present

## 2019-12-22 DIAGNOSIS — G43909 Migraine, unspecified, not intractable, without status migrainosus: Secondary | ICD-10-CM | POA: Diagnosis not present

## 2019-12-22 DIAGNOSIS — E559 Vitamin D deficiency, unspecified: Secondary | ICD-10-CM | POA: Diagnosis not present

## 2019-12-22 DIAGNOSIS — J309 Allergic rhinitis, unspecified: Secondary | ICD-10-CM | POA: Diagnosis not present

## 2019-12-22 DIAGNOSIS — I10 Essential (primary) hypertension: Secondary | ICD-10-CM | POA: Diagnosis not present

## 2019-12-22 DIAGNOSIS — E669 Obesity, unspecified: Secondary | ICD-10-CM | POA: Diagnosis not present

## 2020-01-02 DIAGNOSIS — I639 Cerebral infarction, unspecified: Secondary | ICD-10-CM | POA: Diagnosis not present

## 2020-01-04 ENCOUNTER — Other Ambulatory Visit: Payer: Self-pay

## 2020-01-04 IMAGING — DX SACRUM AND COCCYX - 2+ VIEW
3 series · 3 of 3 positions shown · non-contrast
Comparison: CT abdomen and pelvis 08/20/2015

CLINICAL DATA: RIGHT ankle and coccygeal pain post fall

EXAM:
SACRUM AND COCCYX - 2+ VIEW

[coccyx ap]
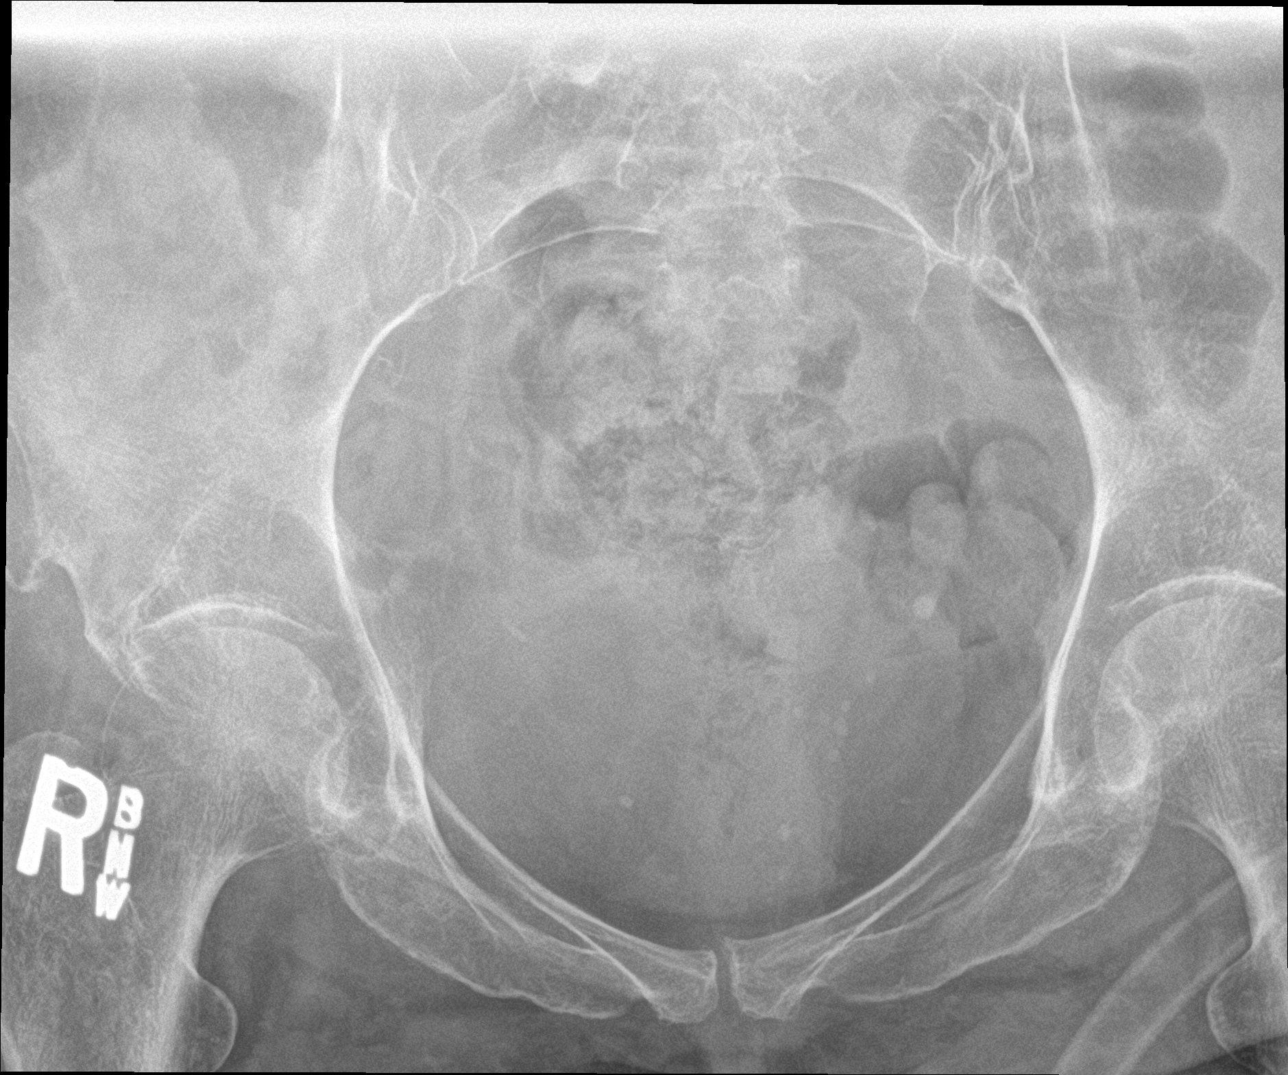

[sacrum ap]
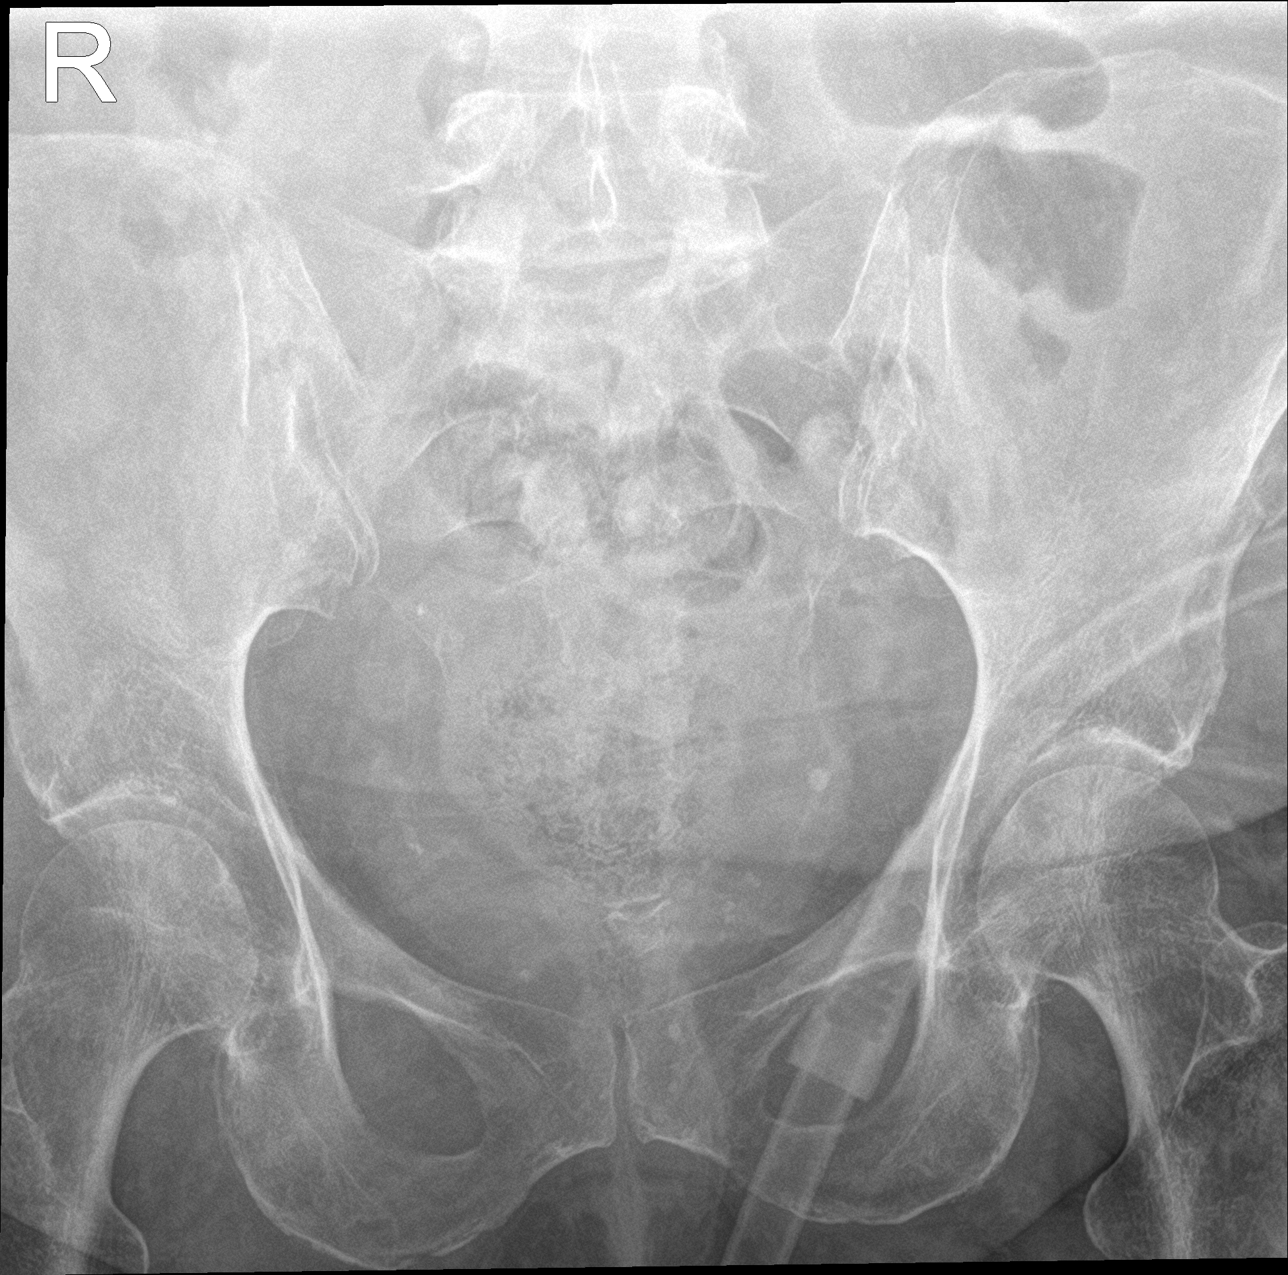

[sacrum lat]
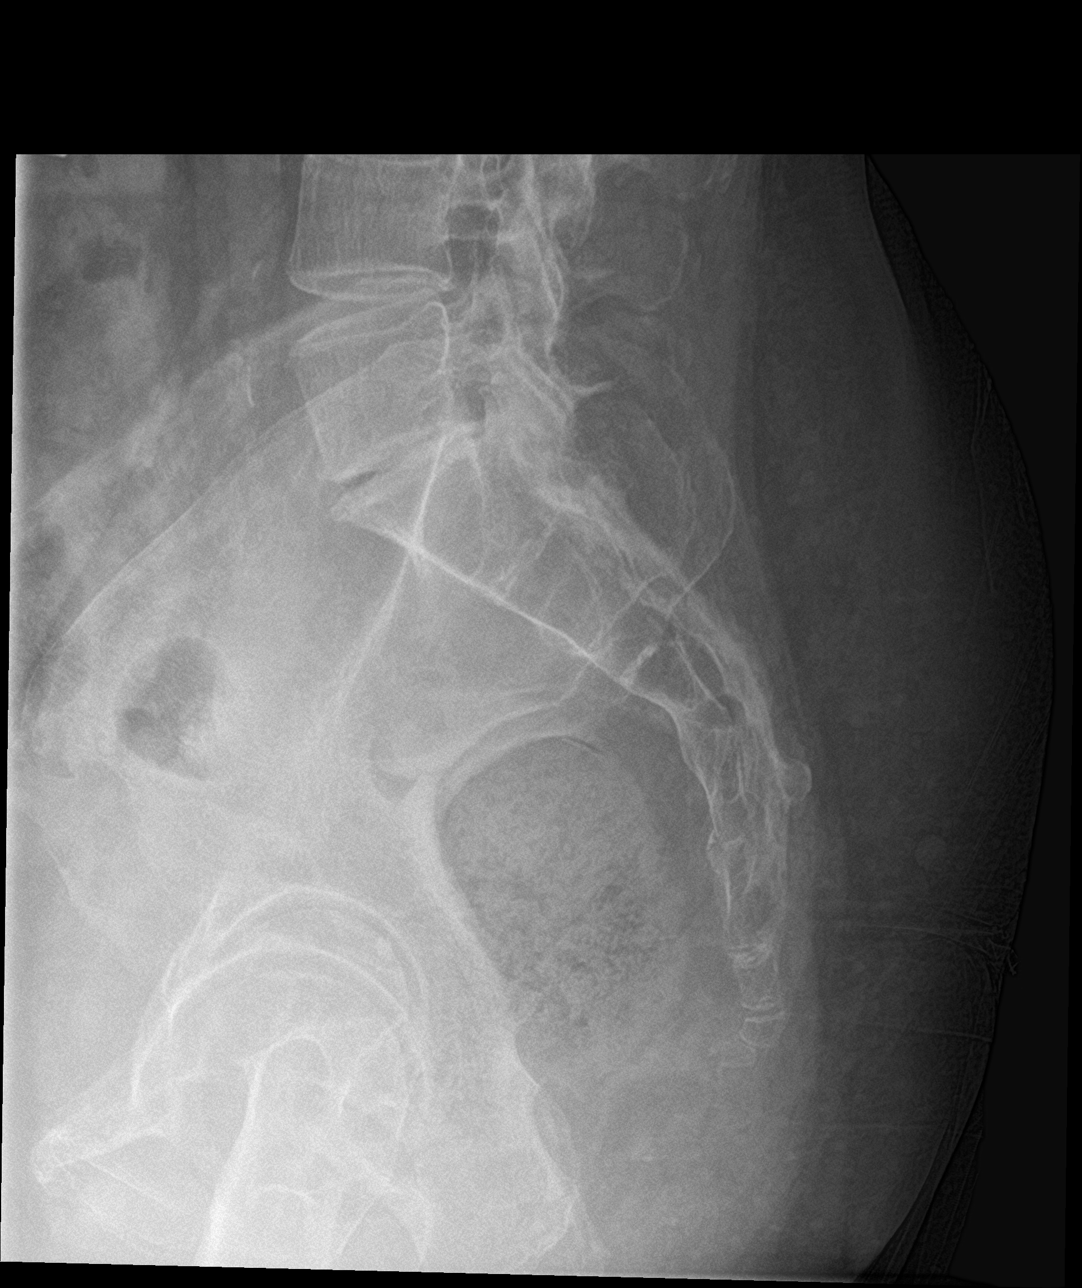

[3 of 3 positions shown; findings below may reference images not displayed]

FINDINGS: Osseous demineralization.

Hip and SI joint spaces preserved.

Sacral foramina symmetric.

No definite acute sacrococcygeal fracture identified.
IMPRESSION: No definite acute sacrococcygeal abnormalities.

## 2020-01-04 IMAGING — DX RIGHT ANKLE - COMPLETE 3+ VIEW
3 series · 3 of 3 positions shown · non-contrast
Comparison: 08/21/2005

CLINICAL DATA: RIGHT ankle pain and coccygeal pain post fall

EXAM:
RIGHT ANKLE - COMPLETE 3+ VIEW

[ankle ap]
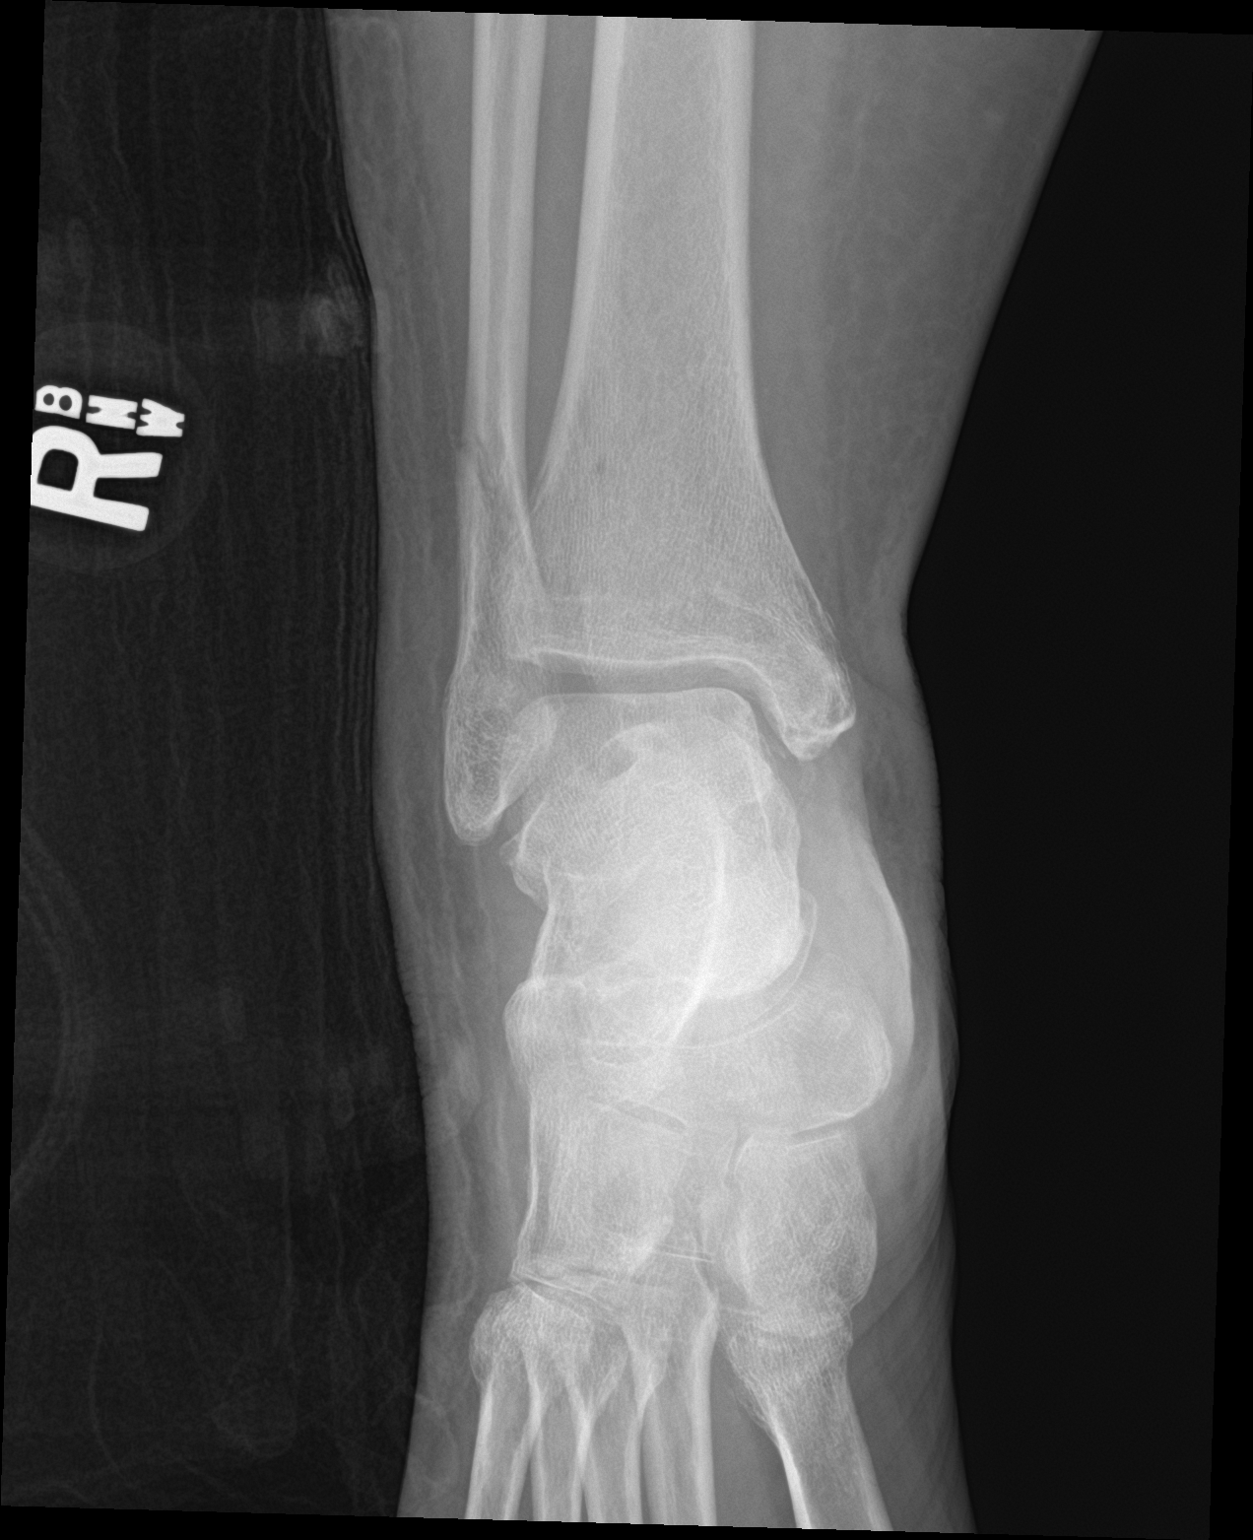

[ankle obl]
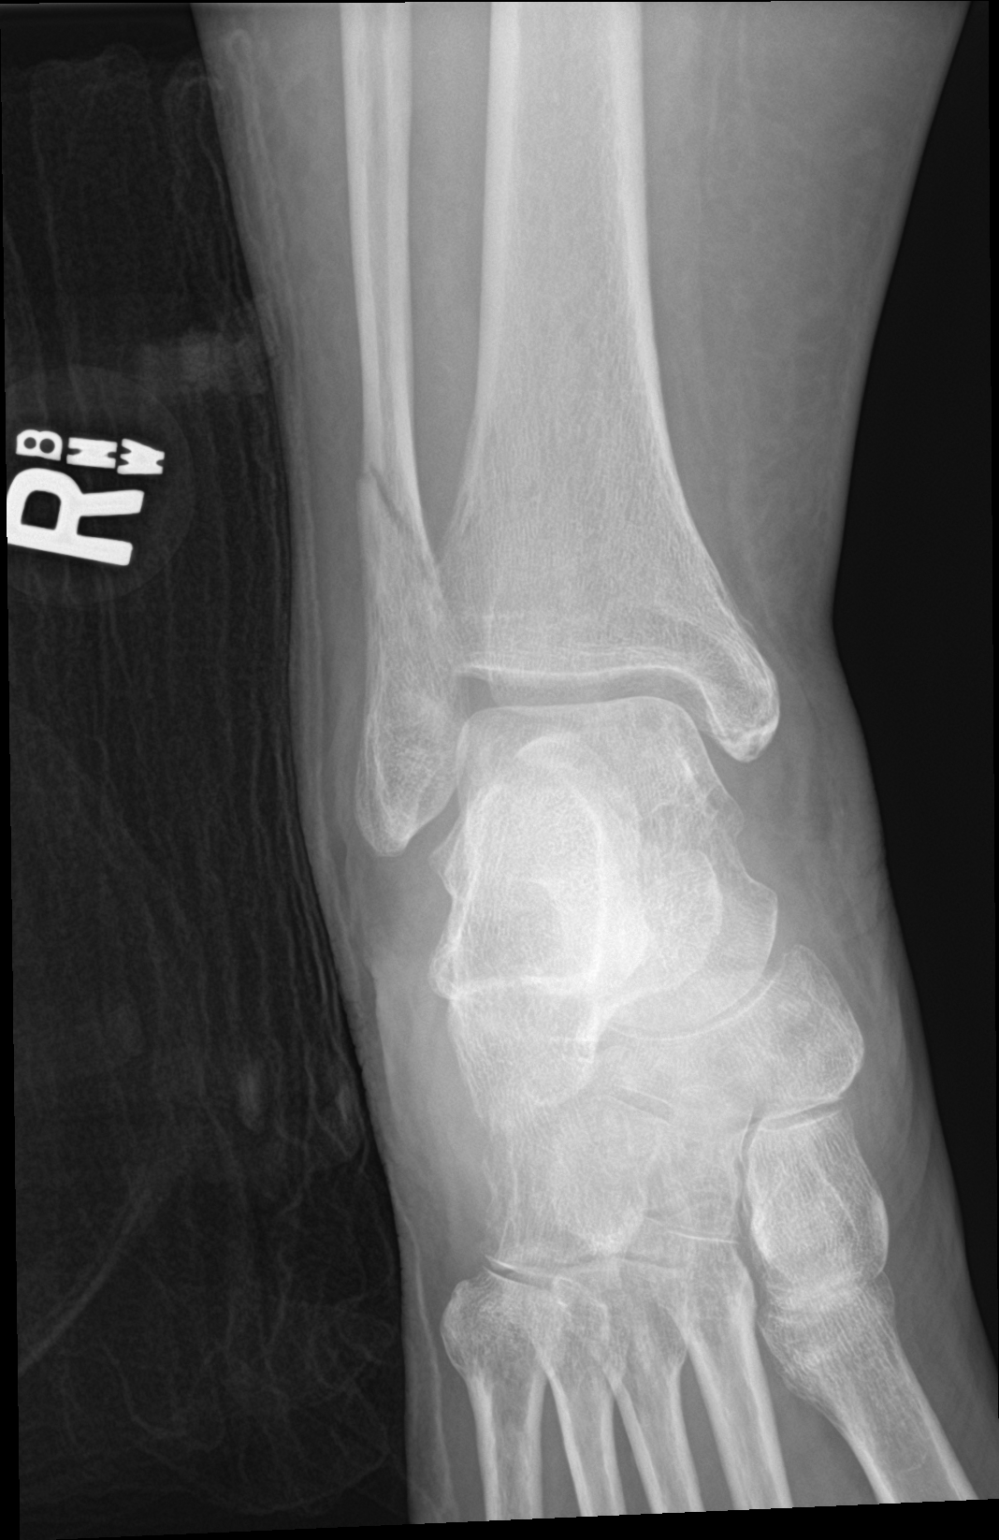

[ankle lat]
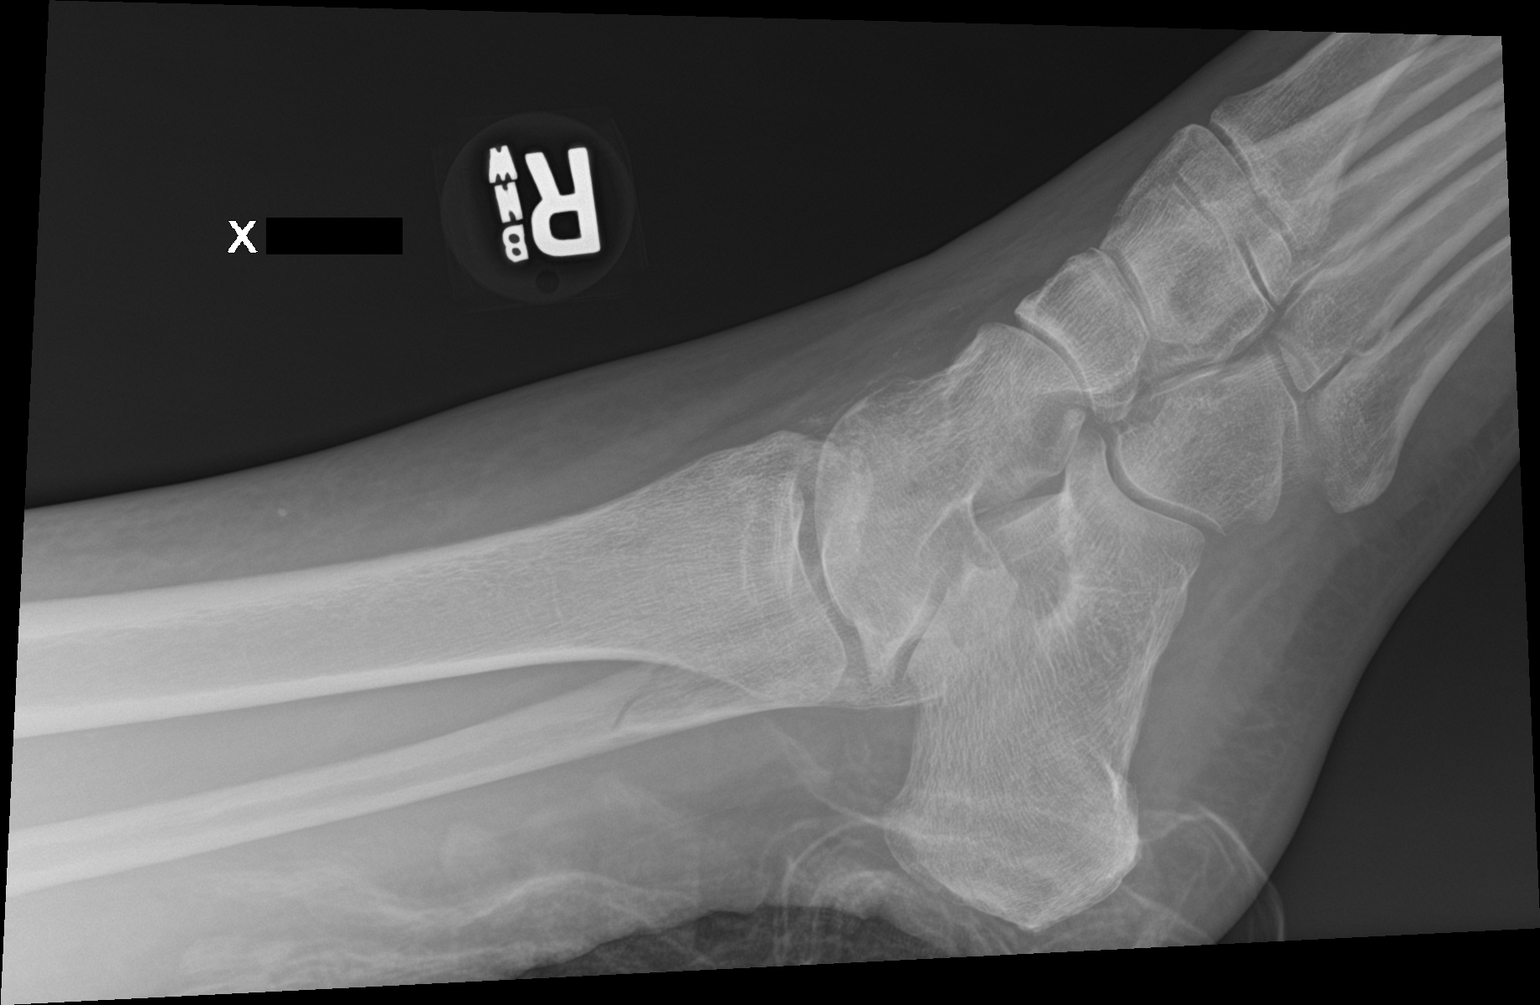

[3 of 3 positions shown; findings below may reference images not displayed]

FINDINGS: Osseous demineralization.

Scattered soft tissue swelling at RIGHT ankle.

Minimal widening of the lateral ankle joint.

Oblique fracture of the distal RIGHT fibula.

However a thin linear calcific density is identified at the medial
margin of the distal RIGHT tibial metaphysis suspicious for a
retinacular avulsion injury.

Additionally, a tiny bone fragment is seen at the medial joint line
which may represent an avulsion fracture though it is uncertain
whether this arises from the medial malleolus or the talus.

No additional fracture, dislocation or bone destruction.
IMPRESSION: Oblique nondisplaced fracture of the distal RIGHT fibula.

Suspected retinacular avulsion fracture at the medial margin of the
distal RIGHT tibial metaphysis.

Probable small avulsion fracture fragment at medial joint line,
though uncertain as to whether this is of medial malleolar or
adjacent talar origin.

## 2020-01-04 NOTE — Patient Outreach (Signed)
First telephone outreach attempt to obtain mRS. No answer. Left message for returned call.  Ina Homes [facility] Management Assistant [phone]

## 2020-01-06 ENCOUNTER — Other Ambulatory Visit: Payer: Self-pay

## 2020-01-06 NOTE — Patient Outreach (Signed)
Telephone outreach to patient to obtain mRS was successfully completed. MRS=4  Ina Homes [facility] Management Assistant [phone]

## 2020-01-07 DIAGNOSIS — F419 Anxiety disorder, unspecified: Secondary | ICD-10-CM | POA: Diagnosis not present

## 2020-01-07 DIAGNOSIS — J309 Allergic rhinitis, unspecified: Secondary | ICD-10-CM | POA: Diagnosis not present

## 2020-01-07 DIAGNOSIS — I82443 Acute embolism and thrombosis of tibial vein, bilateral: Secondary | ICD-10-CM | POA: Diagnosis not present

## 2020-01-07 DIAGNOSIS — G35 Multiple sclerosis: Secondary | ICD-10-CM | POA: Diagnosis not present

## 2020-01-07 DIAGNOSIS — E669 Obesity, unspecified: Secondary | ICD-10-CM | POA: Diagnosis not present

## 2020-01-07 DIAGNOSIS — K754 Autoimmune hepatitis: Secondary | ICD-10-CM | POA: Diagnosis not present

## 2020-01-07 DIAGNOSIS — E119 Type 2 diabetes mellitus without complications: Secondary | ICD-10-CM | POA: Diagnosis not present

## 2020-01-07 DIAGNOSIS — Q211 Atrial septal defect: Secondary | ICD-10-CM | POA: Diagnosis not present

## 2020-01-07 DIAGNOSIS — Z6835 Body mass index (BMI) 35.0-35.9, adult: Secondary | ICD-10-CM | POA: Diagnosis not present

## 2020-01-07 DIAGNOSIS — I69328 Other speech and language deficits following cerebral infarction: Secondary | ICD-10-CM | POA: Diagnosis not present

## 2020-01-07 DIAGNOSIS — Z7901 Long term (current) use of anticoagulants: Secondary | ICD-10-CM | POA: Diagnosis not present

## 2020-01-07 DIAGNOSIS — K5790 Diverticulosis of intestine, part unspecified, without perforation or abscess without bleeding: Secondary | ICD-10-CM | POA: Diagnosis not present

## 2020-01-07 DIAGNOSIS — E559 Vitamin D deficiency, unspecified: Secondary | ICD-10-CM | POA: Diagnosis not present

## 2020-01-07 DIAGNOSIS — K649 Unspecified hemorrhoids: Secondary | ICD-10-CM | POA: Diagnosis not present

## 2020-01-07 DIAGNOSIS — I69354 Hemiplegia and hemiparesis following cerebral infarction affecting left non-dominant side: Secondary | ICD-10-CM | POA: Diagnosis not present

## 2020-01-07 DIAGNOSIS — Z7984 Long term (current) use of oral hypoglycemic drugs: Secondary | ICD-10-CM | POA: Diagnosis not present

## 2020-01-07 DIAGNOSIS — E785 Hyperlipidemia, unspecified: Secondary | ICD-10-CM | POA: Diagnosis not present

## 2020-01-07 DIAGNOSIS — I080 Rheumatic disorders of both mitral and aortic valves: Secondary | ICD-10-CM | POA: Diagnosis not present

## 2020-01-07 DIAGNOSIS — I10 Essential (primary) hypertension: Secondary | ICD-10-CM | POA: Diagnosis not present

## 2020-01-07 DIAGNOSIS — G43909 Migraine, unspecified, not intractable, without status migrainosus: Secondary | ICD-10-CM | POA: Diagnosis not present

## 2020-01-07 DIAGNOSIS — S82831D Other fracture of upper and lower end of right fibula, subsequent encounter for closed fracture with routine healing: Secondary | ICD-10-CM | POA: Diagnosis not present

## 2020-01-07 DIAGNOSIS — K7589 Other specified inflammatory liver diseases: Secondary | ICD-10-CM | POA: Diagnosis not present

## 2020-01-07 DIAGNOSIS — M549 Dorsalgia, unspecified: Secondary | ICD-10-CM | POA: Diagnosis not present

## 2020-01-07 DIAGNOSIS — Z86010 Personal history of colonic polyps: Secondary | ICD-10-CM | POA: Diagnosis not present

## 2020-01-07 DIAGNOSIS — Z87440 Personal history of urinary (tract) infections: Secondary | ICD-10-CM | POA: Diagnosis not present

## 2020-01-08 ENCOUNTER — Other Ambulatory Visit: Payer: Self-pay | Admitting: Neurology

## 2020-01-14 DIAGNOSIS — E669 Obesity, unspecified: Secondary | ICD-10-CM | POA: Diagnosis not present

## 2020-01-14 DIAGNOSIS — Q211 Atrial septal defect: Secondary | ICD-10-CM | POA: Diagnosis not present

## 2020-01-14 DIAGNOSIS — Z7901 Long term (current) use of anticoagulants: Secondary | ICD-10-CM | POA: Diagnosis not present

## 2020-01-14 DIAGNOSIS — K754 Autoimmune hepatitis: Secondary | ICD-10-CM | POA: Diagnosis not present

## 2020-01-14 DIAGNOSIS — G43909 Migraine, unspecified, not intractable, without status migrainosus: Secondary | ICD-10-CM | POA: Diagnosis not present

## 2020-01-14 DIAGNOSIS — I10 Essential (primary) hypertension: Secondary | ICD-10-CM | POA: Diagnosis not present

## 2020-01-14 DIAGNOSIS — I69328 Other speech and language deficits following cerebral infarction: Secondary | ICD-10-CM | POA: Diagnosis not present

## 2020-01-14 DIAGNOSIS — S82831D Other fracture of upper and lower end of right fibula, subsequent encounter for closed fracture with routine healing: Secondary | ICD-10-CM | POA: Diagnosis not present

## 2020-01-14 DIAGNOSIS — I080 Rheumatic disorders of both mitral and aortic valves: Secondary | ICD-10-CM | POA: Diagnosis not present

## 2020-01-14 DIAGNOSIS — E785 Hyperlipidemia, unspecified: Secondary | ICD-10-CM | POA: Diagnosis not present

## 2020-01-14 DIAGNOSIS — K5790 Diverticulosis of intestine, part unspecified, without perforation or abscess without bleeding: Secondary | ICD-10-CM | POA: Diagnosis not present

## 2020-01-14 DIAGNOSIS — Z7984 Long term (current) use of oral hypoglycemic drugs: Secondary | ICD-10-CM | POA: Diagnosis not present

## 2020-01-14 DIAGNOSIS — F419 Anxiety disorder, unspecified: Secondary | ICD-10-CM | POA: Diagnosis not present

## 2020-01-14 DIAGNOSIS — E119 Type 2 diabetes mellitus without complications: Secondary | ICD-10-CM | POA: Diagnosis not present

## 2020-01-14 DIAGNOSIS — I82443 Acute embolism and thrombosis of tibial vein, bilateral: Secondary | ICD-10-CM | POA: Diagnosis not present

## 2020-01-14 DIAGNOSIS — Z6835 Body mass index (BMI) 35.0-35.9, adult: Secondary | ICD-10-CM | POA: Diagnosis not present

## 2020-01-14 DIAGNOSIS — I69354 Hemiplegia and hemiparesis following cerebral infarction affecting left non-dominant side: Secondary | ICD-10-CM | POA: Diagnosis not present

## 2020-01-14 DIAGNOSIS — K649 Unspecified hemorrhoids: Secondary | ICD-10-CM | POA: Diagnosis not present

## 2020-01-14 DIAGNOSIS — J309 Allergic rhinitis, unspecified: Secondary | ICD-10-CM | POA: Diagnosis not present

## 2020-01-14 DIAGNOSIS — Z87440 Personal history of urinary (tract) infections: Secondary | ICD-10-CM | POA: Diagnosis not present

## 2020-01-14 DIAGNOSIS — E559 Vitamin D deficiency, unspecified: Secondary | ICD-10-CM | POA: Diagnosis not present

## 2020-01-14 DIAGNOSIS — M549 Dorsalgia, unspecified: Secondary | ICD-10-CM | POA: Diagnosis not present

## 2020-01-14 DIAGNOSIS — G35 Multiple sclerosis: Secondary | ICD-10-CM | POA: Diagnosis not present

## 2020-01-14 DIAGNOSIS — K7589 Other specified inflammatory liver diseases: Secondary | ICD-10-CM | POA: Diagnosis not present

## 2020-01-14 DIAGNOSIS — Z86010 Personal history of colonic polyps: Secondary | ICD-10-CM | POA: Diagnosis not present

## 2020-01-27 DIAGNOSIS — M549 Dorsalgia, unspecified: Secondary | ICD-10-CM | POA: Diagnosis not present

## 2020-01-27 DIAGNOSIS — I69328 Other speech and language deficits following cerebral infarction: Secondary | ICD-10-CM | POA: Diagnosis not present

## 2020-01-27 DIAGNOSIS — E669 Obesity, unspecified: Secondary | ICD-10-CM | POA: Diagnosis not present

## 2020-01-27 DIAGNOSIS — Z6835 Body mass index (BMI) 35.0-35.9, adult: Secondary | ICD-10-CM | POA: Diagnosis not present

## 2020-01-27 DIAGNOSIS — I10 Essential (primary) hypertension: Secondary | ICD-10-CM | POA: Diagnosis not present

## 2020-01-27 DIAGNOSIS — K5790 Diverticulosis of intestine, part unspecified, without perforation or abscess without bleeding: Secondary | ICD-10-CM | POA: Diagnosis not present

## 2020-01-27 DIAGNOSIS — K754 Autoimmune hepatitis: Secondary | ICD-10-CM | POA: Diagnosis not present

## 2020-01-27 DIAGNOSIS — I080 Rheumatic disorders of both mitral and aortic valves: Secondary | ICD-10-CM | POA: Diagnosis not present

## 2020-01-27 DIAGNOSIS — Z7901 Long term (current) use of anticoagulants: Secondary | ICD-10-CM | POA: Diagnosis not present

## 2020-01-27 DIAGNOSIS — F419 Anxiety disorder, unspecified: Secondary | ICD-10-CM | POA: Diagnosis not present

## 2020-01-27 DIAGNOSIS — I69354 Hemiplegia and hemiparesis following cerebral infarction affecting left non-dominant side: Secondary | ICD-10-CM | POA: Diagnosis not present

## 2020-01-27 DIAGNOSIS — E559 Vitamin D deficiency, unspecified: Secondary | ICD-10-CM | POA: Diagnosis not present

## 2020-01-27 DIAGNOSIS — S82831D Other fracture of upper and lower end of right fibula, subsequent encounter for closed fracture with routine healing: Secondary | ICD-10-CM | POA: Diagnosis not present

## 2020-01-27 DIAGNOSIS — E785 Hyperlipidemia, unspecified: Secondary | ICD-10-CM | POA: Diagnosis not present

## 2020-01-27 DIAGNOSIS — K649 Unspecified hemorrhoids: Secondary | ICD-10-CM | POA: Diagnosis not present

## 2020-01-27 DIAGNOSIS — G43909 Migraine, unspecified, not intractable, without status migrainosus: Secondary | ICD-10-CM | POA: Diagnosis not present

## 2020-01-27 DIAGNOSIS — I82443 Acute embolism and thrombosis of tibial vein, bilateral: Secondary | ICD-10-CM | POA: Diagnosis not present

## 2020-01-27 DIAGNOSIS — G35 Multiple sclerosis: Secondary | ICD-10-CM | POA: Diagnosis not present

## 2020-01-27 DIAGNOSIS — Z7984 Long term (current) use of oral hypoglycemic drugs: Secondary | ICD-10-CM | POA: Diagnosis not present

## 2020-01-27 DIAGNOSIS — J309 Allergic rhinitis, unspecified: Secondary | ICD-10-CM | POA: Diagnosis not present

## 2020-01-27 DIAGNOSIS — E119 Type 2 diabetes mellitus without complications: Secondary | ICD-10-CM | POA: Diagnosis not present

## 2020-01-27 DIAGNOSIS — Z87440 Personal history of urinary (tract) infections: Secondary | ICD-10-CM | POA: Diagnosis not present

## 2020-01-27 DIAGNOSIS — Q211 Atrial septal defect: Secondary | ICD-10-CM | POA: Diagnosis not present

## 2020-01-27 DIAGNOSIS — K7589 Other specified inflammatory liver diseases: Secondary | ICD-10-CM | POA: Diagnosis not present

## 2020-01-27 DIAGNOSIS — Z86010 Personal history of colonic polyps: Secondary | ICD-10-CM | POA: Diagnosis not present

## 2020-01-29 ENCOUNTER — Other Ambulatory Visit: Payer: Self-pay | Admitting: Neurology

## 2020-01-31 ENCOUNTER — Other Ambulatory Visit: Payer: Self-pay

## 2020-01-31 MED ORDER — COPAXONE 40 MG/ML ~~LOC~~ SOSY: PREFILLED_SYRINGE | SUBCUTANEOUS | 6 refills | Status: DC

## 2020-02-01 DIAGNOSIS — I639 Cerebral infarction, unspecified: Secondary | ICD-10-CM | POA: Diagnosis not present

## 2020-02-09 DIAGNOSIS — E119 Type 2 diabetes mellitus without complications: Secondary | ICD-10-CM | POA: Diagnosis not present

## 2020-02-09 DIAGNOSIS — K7589 Other specified inflammatory liver diseases: Secondary | ICD-10-CM | POA: Diagnosis not present

## 2020-02-09 DIAGNOSIS — K5790 Diverticulosis of intestine, part unspecified, without perforation or abscess without bleeding: Secondary | ICD-10-CM | POA: Diagnosis not present

## 2020-02-09 DIAGNOSIS — G43909 Migraine, unspecified, not intractable, without status migrainosus: Secondary | ICD-10-CM | POA: Diagnosis not present

## 2020-02-09 DIAGNOSIS — I69328 Other speech and language deficits following cerebral infarction: Secondary | ICD-10-CM | POA: Diagnosis not present

## 2020-02-09 DIAGNOSIS — G35 Multiple sclerosis: Secondary | ICD-10-CM | POA: Diagnosis not present

## 2020-02-09 DIAGNOSIS — I82443 Acute embolism and thrombosis of tibial vein, bilateral: Secondary | ICD-10-CM | POA: Diagnosis not present

## 2020-02-09 DIAGNOSIS — Z79891 Long term (current) use of opiate analgesic: Secondary | ICD-10-CM | POA: Diagnosis not present

## 2020-02-09 DIAGNOSIS — F419 Anxiety disorder, unspecified: Secondary | ICD-10-CM | POA: Diagnosis not present

## 2020-02-09 DIAGNOSIS — J309 Allergic rhinitis, unspecified: Secondary | ICD-10-CM | POA: Diagnosis not present

## 2020-02-09 DIAGNOSIS — S82831D Other fracture of upper and lower end of right fibula, subsequent encounter for closed fracture with routine healing: Secondary | ICD-10-CM | POA: Diagnosis not present

## 2020-02-09 DIAGNOSIS — Z87440 Personal history of urinary (tract) infections: Secondary | ICD-10-CM | POA: Diagnosis not present

## 2020-02-09 DIAGNOSIS — I10 Essential (primary) hypertension: Secondary | ICD-10-CM | POA: Diagnosis not present

## 2020-02-09 DIAGNOSIS — K754 Autoimmune hepatitis: Secondary | ICD-10-CM | POA: Diagnosis not present

## 2020-02-09 DIAGNOSIS — Z86010 Personal history of colonic polyps: Secondary | ICD-10-CM | POA: Diagnosis not present

## 2020-02-09 DIAGNOSIS — E785 Hyperlipidemia, unspecified: Secondary | ICD-10-CM | POA: Diagnosis not present

## 2020-02-09 DIAGNOSIS — Q211 Atrial septal defect: Secondary | ICD-10-CM | POA: Diagnosis not present

## 2020-02-09 DIAGNOSIS — I69354 Hemiplegia and hemiparesis following cerebral infarction affecting left non-dominant side: Secondary | ICD-10-CM | POA: Diagnosis not present

## 2020-02-09 DIAGNOSIS — E669 Obesity, unspecified: Secondary | ICD-10-CM | POA: Diagnosis not present

## 2020-02-09 DIAGNOSIS — I080 Rheumatic disorders of both mitral and aortic valves: Secondary | ICD-10-CM | POA: Diagnosis not present

## 2020-02-09 DIAGNOSIS — Z7984 Long term (current) use of oral hypoglycemic drugs: Secondary | ICD-10-CM | POA: Diagnosis not present

## 2020-02-09 DIAGNOSIS — Z6835 Body mass index (BMI) 35.0-35.9, adult: Secondary | ICD-10-CM | POA: Diagnosis not present

## 2020-02-09 DIAGNOSIS — Z7901 Long term (current) use of anticoagulants: Secondary | ICD-10-CM | POA: Diagnosis not present

## 2020-02-09 DIAGNOSIS — K649 Unspecified hemorrhoids: Secondary | ICD-10-CM | POA: Diagnosis not present

## 2020-02-09 DIAGNOSIS — E559 Vitamin D deficiency, unspecified: Secondary | ICD-10-CM | POA: Diagnosis not present

## 2020-02-24 ENCOUNTER — Telehealth: Payer: Self-pay

## 2020-02-24 NOTE — Telephone Encounter (Signed)
[employer] co pay relief  Program at  [phone] twice and confirmed.

## 2020-02-25 DIAGNOSIS — G35 Multiple sclerosis: Secondary | ICD-10-CM | POA: Diagnosis not present

## 2020-02-25 DIAGNOSIS — I69354 Hemiplegia and hemiparesis following cerebral infarction affecting left non-dominant side: Secondary | ICD-10-CM | POA: Diagnosis not present

## 2020-02-26 ENCOUNTER — Other Ambulatory Visit: Payer: Self-pay | Admitting: Internal Medicine

## 2020-03-01 DIAGNOSIS — Z7901 Long term (current) use of anticoagulants: Secondary | ICD-10-CM | POA: Diagnosis not present

## 2020-03-01 DIAGNOSIS — E119 Type 2 diabetes mellitus without complications: Secondary | ICD-10-CM | POA: Diagnosis not present

## 2020-03-01 DIAGNOSIS — G35 Multiple sclerosis: Secondary | ICD-10-CM | POA: Diagnosis not present

## 2020-03-01 DIAGNOSIS — E559 Vitamin D deficiency, unspecified: Secondary | ICD-10-CM | POA: Diagnosis not present

## 2020-03-01 DIAGNOSIS — I82443 Acute embolism and thrombosis of tibial vein, bilateral: Secondary | ICD-10-CM | POA: Diagnosis not present

## 2020-03-01 DIAGNOSIS — I080 Rheumatic disorders of both mitral and aortic valves: Secondary | ICD-10-CM | POA: Diagnosis not present

## 2020-03-01 DIAGNOSIS — J309 Allergic rhinitis, unspecified: Secondary | ICD-10-CM | POA: Diagnosis not present

## 2020-03-01 DIAGNOSIS — F419 Anxiety disorder, unspecified: Secondary | ICD-10-CM | POA: Diagnosis not present

## 2020-03-01 DIAGNOSIS — K7589 Other specified inflammatory liver diseases: Secondary | ICD-10-CM | POA: Diagnosis not present

## 2020-03-01 DIAGNOSIS — Z6835 Body mass index (BMI) 35.0-35.9, adult: Secondary | ICD-10-CM | POA: Diagnosis not present

## 2020-03-01 DIAGNOSIS — Z87440 Personal history of urinary (tract) infections: Secondary | ICD-10-CM | POA: Diagnosis not present

## 2020-03-01 DIAGNOSIS — S82831D Other fracture of upper and lower end of right fibula, subsequent encounter for closed fracture with routine healing: Secondary | ICD-10-CM | POA: Diagnosis not present

## 2020-03-01 DIAGNOSIS — I69328 Other speech and language deficits following cerebral infarction: Secondary | ICD-10-CM | POA: Diagnosis not present

## 2020-03-01 DIAGNOSIS — G43909 Migraine, unspecified, not intractable, without status migrainosus: Secondary | ICD-10-CM | POA: Diagnosis not present

## 2020-03-01 DIAGNOSIS — E785 Hyperlipidemia, unspecified: Secondary | ICD-10-CM | POA: Diagnosis not present

## 2020-03-01 DIAGNOSIS — Z86010 Personal history of colonic polyps: Secondary | ICD-10-CM | POA: Diagnosis not present

## 2020-03-01 DIAGNOSIS — Z79891 Long term (current) use of opiate analgesic: Secondary | ICD-10-CM | POA: Diagnosis not present

## 2020-03-01 DIAGNOSIS — I69354 Hemiplegia and hemiparesis following cerebral infarction affecting left non-dominant side: Secondary | ICD-10-CM | POA: Diagnosis not present

## 2020-03-01 DIAGNOSIS — K754 Autoimmune hepatitis: Secondary | ICD-10-CM | POA: Diagnosis not present

## 2020-03-01 DIAGNOSIS — I10 Essential (primary) hypertension: Secondary | ICD-10-CM | POA: Diagnosis not present

## 2020-03-01 DIAGNOSIS — K649 Unspecified hemorrhoids: Secondary | ICD-10-CM | POA: Diagnosis not present

## 2020-03-01 DIAGNOSIS — K5790 Diverticulosis of intestine, part unspecified, without perforation or abscess without bleeding: Secondary | ICD-10-CM | POA: Diagnosis not present

## 2020-03-01 DIAGNOSIS — Z7984 Long term (current) use of oral hypoglycemic drugs: Secondary | ICD-10-CM | POA: Diagnosis not present

## 2020-03-01 DIAGNOSIS — E669 Obesity, unspecified: Secondary | ICD-10-CM | POA: Diagnosis not present

## 2020-03-01 DIAGNOSIS — Q211 Atrial septal defect: Secondary | ICD-10-CM | POA: Diagnosis not present

## 2020-03-03 DIAGNOSIS — I639 Cerebral infarction, unspecified: Secondary | ICD-10-CM | POA: Diagnosis not present

## 2020-03-06 ENCOUNTER — Other Ambulatory Visit: Payer: Self-pay

## 2020-03-06 MED ORDER — BACLOFEN 10 MG PO TABS: ORAL_TABLET | ORAL | 1 refills | Status: DC

## 2020-03-08 DIAGNOSIS — G35 Multiple sclerosis: Secondary | ICD-10-CM | POA: Diagnosis not present

## 2020-03-22 DIAGNOSIS — Z6835 Body mass index (BMI) 35.0-35.9, adult: Secondary | ICD-10-CM | POA: Diagnosis not present

## 2020-03-22 DIAGNOSIS — E669 Obesity, unspecified: Secondary | ICD-10-CM | POA: Diagnosis not present

## 2020-03-22 DIAGNOSIS — E785 Hyperlipidemia, unspecified: Secondary | ICD-10-CM | POA: Diagnosis not present

## 2020-03-22 DIAGNOSIS — I10 Essential (primary) hypertension: Secondary | ICD-10-CM | POA: Diagnosis not present

## 2020-03-22 DIAGNOSIS — I080 Rheumatic disorders of both mitral and aortic valves: Secondary | ICD-10-CM | POA: Diagnosis not present

## 2020-03-22 DIAGNOSIS — G43909 Migraine, unspecified, not intractable, without status migrainosus: Secondary | ICD-10-CM | POA: Diagnosis not present

## 2020-03-22 DIAGNOSIS — K754 Autoimmune hepatitis: Secondary | ICD-10-CM | POA: Diagnosis not present

## 2020-03-22 DIAGNOSIS — Q211 Atrial septal defect: Secondary | ICD-10-CM | POA: Diagnosis not present

## 2020-03-22 DIAGNOSIS — Z7984 Long term (current) use of oral hypoglycemic drugs: Secondary | ICD-10-CM | POA: Diagnosis not present

## 2020-03-22 DIAGNOSIS — J309 Allergic rhinitis, unspecified: Secondary | ICD-10-CM | POA: Diagnosis not present

## 2020-03-22 DIAGNOSIS — K5790 Diverticulosis of intestine, part unspecified, without perforation or abscess without bleeding: Secondary | ICD-10-CM | POA: Diagnosis not present

## 2020-03-22 DIAGNOSIS — Z87440 Personal history of urinary (tract) infections: Secondary | ICD-10-CM | POA: Diagnosis not present

## 2020-03-22 DIAGNOSIS — G35 Multiple sclerosis: Secondary | ICD-10-CM | POA: Diagnosis not present

## 2020-03-22 DIAGNOSIS — S82831D Other fracture of upper and lower end of right fibula, subsequent encounter for closed fracture with routine healing: Secondary | ICD-10-CM | POA: Diagnosis not present

## 2020-03-22 DIAGNOSIS — Z79891 Long term (current) use of opiate analgesic: Secondary | ICD-10-CM | POA: Diagnosis not present

## 2020-03-22 DIAGNOSIS — I69328 Other speech and language deficits following cerebral infarction: Secondary | ICD-10-CM | POA: Diagnosis not present

## 2020-03-22 DIAGNOSIS — E119 Type 2 diabetes mellitus without complications: Secondary | ICD-10-CM | POA: Diagnosis not present

## 2020-03-22 DIAGNOSIS — Z86010 Personal history of colonic polyps: Secondary | ICD-10-CM | POA: Diagnosis not present

## 2020-03-22 DIAGNOSIS — K7589 Other specified inflammatory liver diseases: Secondary | ICD-10-CM | POA: Diagnosis not present

## 2020-03-22 DIAGNOSIS — I69354 Hemiplegia and hemiparesis following cerebral infarction affecting left non-dominant side: Secondary | ICD-10-CM | POA: Diagnosis not present

## 2020-03-22 DIAGNOSIS — F419 Anxiety disorder, unspecified: Secondary | ICD-10-CM | POA: Diagnosis not present

## 2020-03-22 DIAGNOSIS — E559 Vitamin D deficiency, unspecified: Secondary | ICD-10-CM | POA: Diagnosis not present

## 2020-03-22 DIAGNOSIS — I82443 Acute embolism and thrombosis of tibial vein, bilateral: Secondary | ICD-10-CM | POA: Diagnosis not present

## 2020-03-22 DIAGNOSIS — K649 Unspecified hemorrhoids: Secondary | ICD-10-CM | POA: Diagnosis not present

## 2020-03-22 DIAGNOSIS — Z7901 Long term (current) use of anticoagulants: Secondary | ICD-10-CM | POA: Diagnosis not present

## 2020-04-02 DIAGNOSIS — I639 Cerebral infarction, unspecified: Secondary | ICD-10-CM | POA: Diagnosis not present

## 2020-04-07 DIAGNOSIS — E1169 Type 2 diabetes mellitus with other specified complication: Secondary | ICD-10-CM | POA: Diagnosis not present

## 2020-04-07 DIAGNOSIS — L218 Other seborrheic dermatitis: Secondary | ICD-10-CM | POA: Diagnosis not present

## 2020-04-07 DIAGNOSIS — E782 Mixed hyperlipidemia: Secondary | ICD-10-CM | POA: Diagnosis not present

## 2020-04-07 DIAGNOSIS — I10 Essential (primary) hypertension: Secondary | ICD-10-CM | POA: Diagnosis not present

## 2020-04-07 DIAGNOSIS — I69354 Hemiplegia and hemiparesis following cerebral infarction affecting left non-dominant side: Secondary | ICD-10-CM | POA: Diagnosis not present

## 2020-04-07 DIAGNOSIS — Z79899 Other long term (current) drug therapy: Secondary | ICD-10-CM | POA: Diagnosis not present

## 2020-05-03 DIAGNOSIS — I639 Cerebral infarction, unspecified: Secondary | ICD-10-CM | POA: Diagnosis not present

## 2020-05-06 ENCOUNTER — Other Ambulatory Visit: Payer: Self-pay | Admitting: Diagnostic Neuroimaging

## 2020-05-09 NOTE — Telephone Encounter (Signed)
Last seen 11/05/19 by Dr. Jannifer Franklin. Checked drug registry. She  last refilled 02/01/20 #90. Next f/u 05/29/20.

## 2020-05-13 ENCOUNTER — Other Ambulatory Visit: Payer: Self-pay | Admitting: Neurology

## 2020-05-15 ENCOUNTER — Encounter: Payer: Self-pay | Admitting: Neurology

## 2020-05-15 ENCOUNTER — Telehealth: Payer: Self-pay | Admitting: Neurology

## 2020-05-15 NOTE — Telephone Encounter (Signed)
I will dictate a note.

## 2020-05-15 NOTE — Telephone Encounter (Signed)
Pt is asking for a letter from Dr Jannifer Franklin to excuse her from [employer].  Pt states she got the notification in the mail over the weekend.  Pt is asking that the letter be mailed to her.  Pt confirmed her address to still be  [place] [address]

## 2020-05-16 NOTE — Telephone Encounter (Signed)
I called the patient to let her know this request has been completed for her.

## 2020-05-16 NOTE — Telephone Encounter (Signed)
Letter placed in mail to patient today.

## 2020-05-29 ENCOUNTER — Encounter: Payer: Self-pay | Admitting: Neurology

## 2020-05-29 ENCOUNTER — Telehealth (INDEPENDENT_AMBULATORY_CARE_PROVIDER_SITE_OTHER): Payer: PPO | Admitting: Neurology

## 2020-05-29 DIAGNOSIS — E1140 Type 2 diabetes mellitus with diabetic neuropathy, unspecified: Secondary | ICD-10-CM

## 2020-05-29 DIAGNOSIS — G35 Multiple sclerosis: Secondary | ICD-10-CM | POA: Diagnosis not present

## 2020-05-29 NOTE — Progress Notes (Signed)
     Virtual Visit via Video Note  I connected with Gabriela Rice on 05/29/20 at 12:00 PM EDT by a video enabled telemedicine application and verified that I am speaking with the correct person using two identifiers.  Location: Patient: The patient is at home Provider: Physician in office.   I discussed the limitations of evaluation and management by telemedicine and the availability of in person appointments. The patient expressed understanding and agreed to proceed.  History of Present Illness: Gabriela Rice is a 64 year old right-handed white female with a history of multiple sclerosis with a secondary progressive course.  The patient has a severe paraparesis that has gradually worsened over the last 6 months.  The patient was in the hospital in December 2020 with left arm weakness and some speech alteration and was found to have a right brain stroke.  The patient has been relatively immobile, she has had gradual worsening weakness in the legs.  She really has no control of the legs at all at this point.  She still has well-maintained arm strength.  She has difficulty with actual muscle strength, when sitting up in a wheelchair she needs a strap across the chest to keep her in the chair due to axial muscle weakness, and her legs keep falling off the wheelchair, she has purchased some straps for the legs online.  She has just completed physical therapy without much benefit.  She has a lot of ankle and foot pain.  Pain is an issue when she is sitting in the chair.  She has difficulty controlling the bowels and the bladder, she wears adult diapers.  She spends the vast amount of her time in bed.  She remains on Imuran for autoimmune hepatitis, she is on Copaxone.   Observations/Objective: The patient is obese, she is alert and cooperative.  Speech is well enunciated.  Extraocular movements are full.  The patient has a symmetric smile, she is able to protrude the tongue in the midline with good  lateral movement of the tongue.  She has good movement of the arms, good finger-nose-finger bilaterally.  She has no voluntary movement of the legs with exception that she is able to wiggle the toes.  Assessment and Plan: 1.  Multiple sclerosis with paraparesis  2.  Autoimmune hepatitis  3.  Bilateral ankle and foot pain  4.  History of right brain stroke  The patient has progressed somewhat the weakness in the legs it appears.  The patient will try the straps to keep her legs in the wheelchair but in the future she may benefit from a motorized wheelchair that will allow different positions and help with pain and allow her to be more independent with mobilization.  The patient will continue the Copaxone for now, she will follow-up here in 6 months.  Follow Up Instructions:    I discussed the assessment and treatment plan with the patient. The patient was provided an opportunity to ask questions and all were answered. The patient agreed with the plan and demonstrated an understanding of the instructions.   The patient was advised to call back or seek an in-person evaluation if the symptoms worsen or if the condition fails to improve as anticipated.  I provided 25 minutes of non-face-to-face time during this encounter.   Kathrynn Ducking, MD

## 2020-06-03 DIAGNOSIS — I639 Cerebral infarction, unspecified: Secondary | ICD-10-CM | POA: Diagnosis not present

## 2020-06-12 ENCOUNTER — Other Ambulatory Visit: Payer: Self-pay | Admitting: Neurology

## 2020-06-12 DIAGNOSIS — Z5181 Encounter for therapeutic drug level monitoring: Secondary | ICD-10-CM

## 2020-06-12 DIAGNOSIS — G35 Multiple sclerosis: Secondary | ICD-10-CM

## 2020-07-03 DIAGNOSIS — I639 Cerebral infarction, unspecified: Secondary | ICD-10-CM | POA: Diagnosis not present

## 2020-07-11 DIAGNOSIS — Z23 Encounter for immunization: Secondary | ICD-10-CM | POA: Diagnosis not present

## 2020-07-11 DIAGNOSIS — E1165 Type 2 diabetes mellitus with hyperglycemia: Secondary | ICD-10-CM | POA: Diagnosis not present

## 2020-07-28 ENCOUNTER — Telehealth: Payer: Self-pay | Admitting: Physician Assistant

## 2020-07-28 NOTE — Telephone Encounter (Signed)
Called to discuss the homebound Covid-19 vaccination initiative with the patient and/or caregiver.   Message left to call back.  Shirlena Brinegar PA-C  [facility]     

## 2020-08-02 ENCOUNTER — Telehealth: Payer: Self-pay | Admitting: Physician Assistant

## 2020-08-02 NOTE — Telephone Encounter (Signed)
Called to discuss the homebound Covid-19 vaccination initiative with the patient and/or caregiver.   Message left to call back.  Loudon Krakow PA-C  [facility]     

## 2020-08-03 ENCOUNTER — Telehealth: Payer: Self-pay | Admitting: Physician Assistant

## 2020-08-03 DIAGNOSIS — I639 Cerebral infarction, unspecified: Secondary | ICD-10-CM | POA: Diagnosis not present

## 2020-08-03 NOTE — Telephone Encounter (Addendum)
I connected by phone with [name] and/or patient's caregiver on 08/03/2020 at 7:01 PM to discuss the potential vaccination through our Homebound vaccination initiative.   Prevaccination Checklist for COVID-19 Vaccines  1.  Are you feeling sick today? no  2.  Have you ever received a dose of a COVID-19 vaccine?  yes      If yes, which one? Moderna , last dose in June  3.  Have you ever had an allergic reaction: (This would include a severe reaction [ e.g., anaphylaxis] that required treatment with epinephrine or EpiPen or that caused you to go to the hospital.  It would also include an allergic reaction that occurred within 4 hours that caused hives, swelling, or respiratory distress, including wheezing.) A.  A previous dose of COVID-19 vaccine. no  B.  A vaccine or injectable therapy that contains multiple components, one of which is a COVID-19 vaccine component, but it is not known which component elicited the immediate reaction. no  C.  Are you allergic to polyethylene glycol? no  D. Are you allergic to Polysorbate, which is found in some vaccines, film coated tablets and intravenous steroids?  no   4.  Have you ever had an allergic reaction to another vaccine (other than COVID-19 vaccine) or an injectable medication? (This would include a severe reaction [ e.g., anaphylaxis] that required treatment with epinephrine or EpiPen or that caused you to go to the hospital.  It would also include an allergic reaction that occurred within 4 hours that caused hives, swelling, or respiratory distress, including wheezing.)  no   5.  Have you ever had a severe allergic reaction (e.g., anaphylaxis) to something other than a component of the COVID-19 vaccine, or any vaccine or injectable medication?  This would include food, pet, venom, environmental, or oral medication allergies.  no   6.  Have you received any vaccine in the last 14 days? no   7.  Have you ever had a positive test for COVID-19 or  has a doctor ever told you that you had COVID-19?  no   8.  Have you received passive antibody therapy (monoclonal antibodies or convalescent serum) as a treatment for COVID-19? no   9.  Do you have a weakened immune system caused by something such as HIV infection or cancer or do you take immunosuppressive drugs or therapies?  Yes, on imuran   10.  Do you have a bleeding disorder or are you taking a blood thinner? yes, Eliquis  11.  Are you pregnant or breast-feeding? no   12.  Do you have dermal fillers? no   __________________   This patient is a 64 y.o. female that meets the FDA criteria to receive homebound vaccination. Patient or parent/caregiver understands they have the option to accept or refuse homebound vaccination.  Patient passed the pre-screening checklist and would like to proceed with homebound vaccination.  Based on questionnaire above, I recommend the patient be observed for 30 minutes.  There are an estimated 1 other household members/caregivers who are also interested in receiving the vaccine. Husband also would like Moderna  I will send the patient's information to our scheduling team who will reach out to schedule the patient and potential caregiver/family members for homebound vaccination. Pt will get THIRD DOSE due to immunocompromised status.    Angelena Form 08/03/2020 7:01 PM

## 2020-08-04 ENCOUNTER — Other Ambulatory Visit: Payer: Self-pay | Admitting: Neurology

## 2020-08-08 ENCOUNTER — Other Ambulatory Visit: Payer: Self-pay | Admitting: Neurology

## 2020-08-20 ENCOUNTER — Other Ambulatory Visit: Payer: Self-pay | Admitting: Neurology

## 2020-08-21 ENCOUNTER — Other Ambulatory Visit: Payer: Self-pay | Admitting: *Deleted

## 2020-08-21 MED ORDER — BACLOFEN 10 MG PO TABS: ORAL_TABLET | ORAL | 1 refills | Status: DC

## 2020-08-23 ENCOUNTER — Other Ambulatory Visit: Payer: Self-pay

## 2020-08-23 ENCOUNTER — Ambulatory Visit: Payer: PPO | Attending: Internal Medicine

## 2020-08-23 DIAGNOSIS — Z23 Encounter for immunization: Secondary | ICD-10-CM

## 2020-08-23 NOTE — Progress Notes (Signed)
   Covid-19 Vaccination Clinic  Name:  ALAHNA DUNNE    MRN: [id number] DOB: August 23, 1956  08/23/2020  Ms. Bible was observed post Covid-19 immunization for 15 minutes without incident. She was provided with Vaccine Information Sheet and instruction to access the V-Safe system.   Ms. Clippard was instructed to call 911 with any severe reactions post vaccine: Marland Kitchen Difficulty breathing  . Swelling of face and throat  . A fast heartbeat  . A bad rash all over body  . Dizziness and weakness   Immunizations Administered    No immunizations on file.

## 2020-08-29 ENCOUNTER — Other Ambulatory Visit: Payer: Self-pay | Admitting: Internal Medicine

## 2020-09-02 DIAGNOSIS — I639 Cerebral infarction, unspecified: Secondary | ICD-10-CM | POA: Diagnosis not present

## 2020-09-12 DIAGNOSIS — E559 Vitamin D deficiency, unspecified: Secondary | ICD-10-CM | POA: Diagnosis not present

## 2020-09-12 DIAGNOSIS — M47816 Spondylosis without myelopathy or radiculopathy, lumbar region: Secondary | ICD-10-CM | POA: Diagnosis not present

## 2020-09-12 DIAGNOSIS — G35 Multiple sclerosis: Secondary | ICD-10-CM | POA: Diagnosis not present

## 2020-09-12 DIAGNOSIS — Z0000 Encounter for general adult medical examination without abnormal findings: Secondary | ICD-10-CM | POA: Diagnosis not present

## 2020-09-12 DIAGNOSIS — Z1211 Encounter for screening for malignant neoplasm of colon: Secondary | ICD-10-CM | POA: Diagnosis not present

## 2020-09-12 DIAGNOSIS — Z79899 Other long term (current) drug therapy: Secondary | ICD-10-CM | POA: Diagnosis not present

## 2020-09-12 DIAGNOSIS — I69354 Hemiplegia and hemiparesis following cerebral infarction affecting left non-dominant side: Secondary | ICD-10-CM | POA: Diagnosis not present

## 2020-09-12 DIAGNOSIS — E782 Mixed hyperlipidemia: Secondary | ICD-10-CM | POA: Diagnosis not present

## 2020-09-12 DIAGNOSIS — I639 Cerebral infarction, unspecified: Secondary | ICD-10-CM | POA: Diagnosis not present

## 2020-09-12 DIAGNOSIS — I10 Essential (primary) hypertension: Secondary | ICD-10-CM | POA: Diagnosis not present

## 2020-09-12 DIAGNOSIS — E1169 Type 2 diabetes mellitus with other specified complication: Secondary | ICD-10-CM | POA: Diagnosis not present

## 2020-09-14 ENCOUNTER — Telehealth: Payer: Self-pay | Admitting: Neurology

## 2020-09-14 ENCOUNTER — Other Ambulatory Visit: Payer: Self-pay | Admitting: Emergency Medicine

## 2020-09-14 MED ORDER — COPAXONE 40 MG/ML ~~LOC~~ SOSY: PREFILLED_SYRINGE | SUBCUTANEOUS | 6 refills | Status: DC

## 2020-09-14 NOTE — Telephone Encounter (Signed)
Pt is needing a refill on her COPAXONE 40 MG/ML SOSY sent in to the [facility] mail service.

## 2020-09-19 ENCOUNTER — Telehealth: Payer: Self-pay

## 2020-09-19 DIAGNOSIS — Z5181 Encounter for therapeutic drug level monitoring: Secondary | ICD-10-CM

## 2020-09-19 DIAGNOSIS — G35 Multiple sclerosis: Secondary | ICD-10-CM

## 2020-09-19 MED ORDER — TRAMADOL HCL 50 MG PO TABS: ORAL_TABLET | ORAL | 0 refills | Status: DC

## 2020-09-19 NOTE — Telephone Encounter (Signed)
Received a refill request from pharmacy for patient's Tramadol 50mg . Per drug registry, last filled on 06/13/2020 for #[id number]. Pt last seen as a video visit on 05/29/2020 and next appt scheduled for 11/28/2020 with Judson Roch, NP.

## 2020-09-19 NOTE — Telephone Encounter (Signed)
The Ultram prescription will be refilled for 90-day prescription.

## 2020-10-05 DIAGNOSIS — E119 Type 2 diabetes mellitus without complications: Secondary | ICD-10-CM | POA: Diagnosis not present

## 2020-10-05 DIAGNOSIS — Z1211 Encounter for screening for malignant neoplasm of colon: Secondary | ICD-10-CM | POA: Diagnosis not present

## 2020-10-05 DIAGNOSIS — Z7984 Long term (current) use of oral hypoglycemic drugs: Secondary | ICD-10-CM | POA: Diagnosis not present

## 2020-10-18 DIAGNOSIS — G35 Multiple sclerosis: Secondary | ICD-10-CM | POA: Diagnosis not present

## 2020-10-18 DIAGNOSIS — E1169 Type 2 diabetes mellitus with other specified complication: Secondary | ICD-10-CM | POA: Diagnosis not present

## 2020-10-18 DIAGNOSIS — E1159 Type 2 diabetes mellitus with other circulatory complications: Secondary | ICD-10-CM | POA: Diagnosis not present

## 2020-10-18 DIAGNOSIS — E785 Hyperlipidemia, unspecified: Secondary | ICD-10-CM | POA: Diagnosis not present

## 2020-10-18 DIAGNOSIS — I8291 Chronic embolism and thrombosis of unspecified vein: Secondary | ICD-10-CM | POA: Diagnosis not present

## 2020-10-18 DIAGNOSIS — G114 Hereditary spastic paraplegia: Secondary | ICD-10-CM | POA: Diagnosis not present

## 2020-10-18 DIAGNOSIS — D692 Other nonthrombocytopenic purpura: Secondary | ICD-10-CM | POA: Diagnosis not present

## 2020-10-18 DIAGNOSIS — I10 Essential (primary) hypertension: Secondary | ICD-10-CM | POA: Diagnosis not present

## 2020-10-18 DIAGNOSIS — Z515 Encounter for palliative care: Secondary | ICD-10-CM | POA: Diagnosis not present

## 2020-10-18 DIAGNOSIS — F330 Major depressive disorder, recurrent, mild: Secondary | ICD-10-CM | POA: Diagnosis not present

## 2020-10-18 DIAGNOSIS — K754 Autoimmune hepatitis: Secondary | ICD-10-CM | POA: Diagnosis not present

## 2020-10-20 DIAGNOSIS — I639 Cerebral infarction, unspecified: Secondary | ICD-10-CM | POA: Diagnosis not present

## 2020-11-08 ENCOUNTER — Encounter: Payer: Self-pay | Admitting: Internal Medicine

## 2020-11-08 ENCOUNTER — Other Ambulatory Visit: Payer: Self-pay | Admitting: Neurology

## 2020-11-27 NOTE — Progress Notes (Signed)
PATIENT: Gabriela Rice DOB: Oct 10, 1955  REASON FOR VISIT: follow up HISTORY FROM: patient  HISTORY OF PRESENT ILLNESS: Today 11/28/20 Gabriela Rice is a 65 year old female with history of multiple sclerosis with a secondary progressive course.  She has a severe paraparesis. She was hospitalized in December 2020 with left arm weakness and speech alteration was found to have a right brain stroke.  She is on Imuran for autoimmune hepatitis.  She had to stop the Copaxone, due to high co-pay, couldn't get any assistance.  Lives at home with her husband, is nonambulatory, unable to bear weight.  She is in a wheelchair or in the bed.  She has limited muscle strength control, has a strap across her chest, and around her legs.  She uses adult briefs for toileting.  Her husband is her main caregiver, she has 2 sons.  They have a Hoyer lift and hospital bed.  Receives the following medications from our office: Baclofen, Eliquis, Xanax at [facility], gabapentin, and tramadol (chronic pain).  Here today with her husband unaccompanied.  HISTORY 05/29/2020 Dr. Jannifer Franklin: Gabriela Rice is a 65 year old right-handed white female with a history of multiple sclerosis with a secondary progressive course.  The patient has a severe paraparesis that has gradually worsened over the last 6 months.  The patient was in the hospital in December 2020 with left arm weakness and some speech alteration and was found to have a right brain stroke.  The patient has been relatively immobile, she has had gradual worsening weakness in the legs.  She really has no control of the legs at all at this point.  She still has well-maintained arm strength.  She has difficulty with actual muscle strength, when sitting up in a wheelchair she needs a strap across the chest to keep her in the chair due to axial muscle weakness, and her legs keep falling off the wheelchair, she has purchased some straps for the legs online.  She has just completed physical  therapy without much benefit.  She has a lot of ankle and foot pain.  Pain is an issue when she is sitting in the chair.  She has difficulty controlling the bowels and the bladder, she wears adult diapers.  She spends the vast amount of her time in bed.  She remains on Imuran for autoimmune hepatitis, she is on Copaxone.  REVIEW OF SYSTEMS: Out of a complete 14 system review of symptoms, the patient complains only of the following symptoms, and all other reviewed systems are negative.  See HPI  ALLERGIES: Allergies  Allergen Reactions  . Penicillins Other (See Comments)    seizure as child Has patient had a PCN reaction causing immediate rash, facial/tongue/throat swelling, SOB or lightheadedness with hypotension: No Has patient had a PCN reaction causing severe rash involving mucus membranes or skin necrosis:No Has patient had a PCN reaction that required hospitalizationNo Has patient had a PCN reaction occurring within the last 10 years:No If all of the above answers are "NO", then may proceed with Cephalosporin use.    . Codeine Swelling    hands swell    HOME MEDICATIONS: Outpatient Medications Prior to Visit  Medication Sig Dispense Refill  . ALPRAZolam (XANAX) 1 MG tablet 1 tablet at bedtime as needed. CALL OFFICE AT [phone] to schedule follow up for ongoing refills. 90 tablet 1  . azaTHIOprine (IMURAN) 50 MG tablet TAKE 3 TABLETS BY MOUTH AT THE SAME TIME EVERY DAY 270 tablet 1  . cetirizine (ZYRTEC) 10  MG tablet Take 10 mg by mouth daily.    . Cholecalciferol (VITAMIN D) 2000 UNITS CAPS Take 2,000 Units by mouth daily.     . DULoxetine (CYMBALTA) 30 MG capsule Take 90 mg by mouth daily.     Marland Kitchen ELIQUIS 5 MG TABS tablet TAKE 1 TABLET(5 MG) BY MOUTH TWICE DAILY 180 tablet 1  . losartan (COZAAR) 100 MG tablet Take 100 mg by mouth daily.    . metFORMIN (GLUCOPHAGE) 500 MG tablet Take 500 mg by mouth 2 (two) times daily with a meal.     . polyethylene glycol powder  (GLYCOLAX/MIRALAX) 17 GM/SCOOP powder Take 17 g by mouth daily as needed for mild constipation.    . pravastatin (PRAVACHOL) 40 MG tablet Take 40 mg by mouth daily.     . traMADol (ULTRAM) 50 MG tablet TAKE 1 TABLET(50 MG) BY MOUTH THREE TIMES DAILY AS NEEDED 756 tablet 0  . TRULICITY 4.33 IR/5.1OA SOPN Inject 0.75 mg into the skin once a week.    . baclofen (LIORESAL) 10 MG tablet One tablet in the morning and evening and 1.5 tablets at midday 315 tablet 1  . gabapentin (NEURONTIN) 300 MG capsule TAKE 1 CAPSULE BY MOUTH THREE TIMES A DAY 270 capsule 2  . COPAXONE 40 MG/ML SOSY INJECT 40 MG (1 ML) UNDER THE SKIN THREE TIMES A WEEK. 12 mL 6   No facility-administered medications prior to visit.    PAST MEDICAL HISTORY: Past Medical History:  Diagnosis Date  . Abnormality of gait 02/16/2013  . Acute bilateral ankle pain 11/15/2019  . Allergic rhinitis   . Allergy   . Anxiety   . Autoimmune hepatitis ([place])   . Autoimmune hepatitis ([place])   . Colon polyps   . Diverticulosis   . DM (diabetes mellitus) ([place])   . Elevated LFTs   . Hemorrhoids   . Hemorrhoids   . Hepatitis    with cholestasis  . Hyperlipidemia   . Hypertension   . Migraines   . Multiple sclerosis ([place])   . Obesity   . Vitamin D deficiency     PAST SURGICAL HISTORY: Past Surgical History:  Procedure Laterality Date  . ABDOMINAL HYSTERECTOMY    . CESAREAN SECTION     x 2  . MYOECTOMY    . TONSILLECTOMY AND ADENOIDECTOMY      FAMILY HISTORY: Family History  Problem Relation Age of Onset  . Diabetes Mother   . Multiple sclerosis Mother   . Cirrhosis Father   . Heart disease Maternal Grandmother   . Colon cancer Maternal Aunt   . Uterine cancer Maternal Aunt   . Brain cancer Maternal Uncle   . Colon cancer Maternal Uncle   . Breast cancer Neg Hx     SOCIAL HISTORY: Social History   Socioeconomic History  . Marital status: Married    Spouse name: Shanon Brow  . Number of children: 2  . Years of education:  HS  . Highest education level: Not on file  Occupational History  . Occupation: Unemployed  Tobacco Use  . Smoking status: Former Smoker    Packs/day: 0.25    Years: 4.00    Pack years: 1.00    Types: Cigarettes    Quit date: 10/01/1995    Years since quitting: 25.1  . Smokeless tobacco: Never Used  Vaping Use  . Vaping Use: Never used  Substance and Sexual Activity  . Alcohol use: No    Alcohol/week: 0.0 standard drinks  . Drug  use: No  . Sexual activity: Not on file  Other Topics Concern  . Not on file  Social History Narrative   Lives at home w/ her husband   Right-handed   Drinks 1 cup of coffee per day   Social Determinants of Health   Financial Resource Strain: Not on file  Food Insecurity: Not on file  Transportation Needs: Not on file  Physical Activity: Not on file  Stress: Not on file  Social Connections: Not on file  Intimate Partner Violence: Not on file   PHYSICAL EXAM  Vitals:   11/28/20 1255  BP: 140/90  Pulse: (!) 102   There is no height or weight on file to calculate BMI.  Generalized: Well developed, in no acute distress, in good spirts Neurological examination  Mentation: Alert oriented to time, place, history taking. Follows all commands speech and language fluent, cooperative Cranial nerve II-XII: Pupils were equal round reactive to light. Extraocular movements were full, visual field were full on confrontational test. Facial sensation and strength were normal. Head turning and shoulder shrug were normal and symmetric. Motor: Good strength of upper extremities, essentially no movement of the lower extremities, increased spasticity, strapped to wheelchair, when straps removed able to perform PROM with moderate difficulty Sensory: Sensory testing is intact to soft touch on all 4 extremities. No evidence of extinction is noted.  Coordination: Cerebellar testing reveals good finger-nose-finger bilaterally, cannot perform heel-to-shin on either  side Gait and station: Is not ambulatory Reflexes: Deep tendon reflexes are symmetric but depressed throughout  DIAGNOSTIC DATA (LABS, IMAGING, TESTING) - I reviewed patient records, labs, notes, testing and imaging myself where available.  Lab Results  Component Value Date   WBC 5.3 09/29/2019   HGB 12.1 09/29/2019   HCT 34.5 (L) 09/29/2019   MCV 94.3 09/29/2019   PLT 216 09/29/2019      Component Value Date/Time   NA 141 09/29/2019 0333   NA 137 03/16/2019 1133   NA 142 09/16/2014 1420   K 3.4 (L) 09/29/2019 0333   K 3.9 09/16/2014 1420   CL 102 09/29/2019 0333   CL 105 09/16/2014 1420   CO2 27 09/29/2019 0333   CO2 29 09/16/2014 1420   GLUCOSE 164 (H) 09/29/2019 0333   GLUCOSE 111 (H) 09/16/2014 1420   BUN 6 (L) 09/29/2019 0333   BUN 25 03/16/2019 1133   BUN 21 (H) 09/16/2014 1420   CREATININE 0.58 09/29/2019 0333   CREATININE 0.86 09/16/2014 1420   CALCIUM 9.2 09/29/2019 0333   CALCIUM 9.2 09/16/2014 1420   PROT 7.1 12/16/2019 1520   PROT 6.2 03/16/2019 1133   PROT 7.7 09/16/2014 1420   ALBUMIN 4.2 12/16/2019 1520   ALBUMIN 4.3 03/16/2019 1133   ALBUMIN 4.0 09/16/2014 1420   AST 50 (H) 12/16/2019 1520   AST 41 (H) 09/16/2014 1420   ALT 35 12/16/2019 1520   ALT 42 09/16/2014 1420   ALKPHOS 64 12/16/2019 1520   ALKPHOS 69 09/16/2014 1420   BILITOT 0.7 12/16/2019 1520   BILITOT 0.5 03/16/2019 1133   BILITOT 0.6 09/16/2014 1420   GFRNONAA >60 09/29/2019 0333   GFRNONAA >60 09/16/2014 1420   GFRAA >60 09/29/2019 0333   GFRAA >60 09/16/2014 1420   Lab Results  Component Value Date   CHOL 200 09/24/2019   HDL 52 09/24/2019   LDLCALC 125 (H) 09/24/2019   TRIG 114 09/24/2019   CHOLHDL 3.8 09/24/2019   Lab Results  Component Value Date   HGBA1C 6.7 (H)  09/24/2019   No results found for: VITAMINB12 Lab Results  Component Value Date   TSH 0.266 (L) 08/22/2015   ASSESSMENT AND PLAN 65 y.o. year old female  has a past medical history of Abnormality of  gait (02/16/2013), Acute bilateral ankle pain (11/15/2019), Allergic rhinitis, Allergy, Anxiety, Autoimmune hepatitis ([place]), Autoimmune hepatitis ([place]), Colon polyps, Diverticulosis, DM (diabetes mellitus) ([place]), Elevated LFTs, Hemorrhoids, Hemorrhoids, Hepatitis, Hyperlipidemia, Hypertension, Migraines, Multiple sclerosis ([place]), Obesity, and Vitamin D deficiency. here with:  1.  Multiple sclerosis with paraparesis  2.  Autoimmune hepatitis  3.  Bilateral ankle and foot pain  4.  History of right brain stroke  -Has been off Copaxone for a few months, her co-pay is too high -Check routine labs today, previously switch to Rogelia Rohrer has been entertained, will consider that she is already taking Imuran due to history of autoimmune hepatitis, wait for labs to result -Will continue current medications including baclofen, Xanax, Eliquis, gabapentin, and tramadol -Does not think she would be able to operate a motorized wheelchair, due to lack of control of trunk and lower extremities, they have a hospital bed, hoyer lift at home -I talked with her husband about considering getting some in home help, as he is her main caregiver -She will follow-up in 6 months or sooner if needed  I spent 30 minutes of face-to-face and non-face-to-face time with patient.  This included previsit chart review, lab review, study review, order entry, electronic health record documentation, patient education.  Butler Denmark, AGNP-C, DNP 11/28/2020, 1:37 PM [facility] [address] [place], [place] [address] [phone]

## 2020-11-28 ENCOUNTER — Other Ambulatory Visit: Payer: Self-pay

## 2020-11-28 ENCOUNTER — Ambulatory Visit: Payer: PPO | Admitting: Neurology

## 2020-11-28 ENCOUNTER — Encounter: Payer: Self-pay | Admitting: Neurology

## 2020-11-28 VITALS — BP 140/90 | HR 102

## 2020-11-28 DIAGNOSIS — G35 Multiple sclerosis: Secondary | ICD-10-CM

## 2020-11-28 DIAGNOSIS — I63411 Cerebral infarction due to embolism of right middle cerebral artery: Secondary | ICD-10-CM

## 2020-11-28 MED ORDER — GABAPENTIN 300 MG PO CAPS: ORAL_CAPSULE | ORAL | 3 refills | Status: DC

## 2020-11-28 MED ORDER — BACLOFEN 10 MG PO TABS: ORAL_TABLET | ORAL | 3 refills | Status: DC

## 2020-11-28 NOTE — Progress Notes (Signed)
I have read the note, and I agree with the clinical assessment and plan.  Charles K Willis   

## 2020-11-28 NOTE — Patient Instructions (Signed)
Let me talk with Dr. Jannifer Franklin about MS medication Continue current meds for now Check labs today  See you back in 6 months

## 2020-11-29 ENCOUNTER — Telehealth: Payer: Self-pay | Admitting: Neurology

## 2020-11-29 LAB — COMPREHENSIVE METABOLIC PANEL
ALT: 51 IU/L — ABNORMAL HIGH (ref 0–32)
AST: 99 IU/L — ABNORMAL HIGH (ref 0–40)
Albumin/Globulin Ratio: 1.8 (ref 1.2–2.2)
Albumin: 4.5 g/dL (ref 3.8–4.8)
Alkaline Phosphatase: 91 IU/L (ref 44–121)
BUN/Creatinine Ratio: 25 (ref 12–28)
BUN: 15 mg/dL (ref 8–27)
Bilirubin Total: 0.7 mg/dL (ref 0.0–1.2)
CO2: 25 mmol/L (ref 20–29)
Calcium: 10 mg/dL (ref 8.7–10.3)
Chloride: 99 mmol/L (ref 96–106)
Creatinine, Ser: 0.59 mg/dL (ref 0.57–1.00)
Globulin, Total: 2.5 g/dL (ref 1.5–4.5)
Glucose: 125 mg/dL — ABNORMAL HIGH (ref 65–99)
Potassium: 4.6 mmol/L (ref 3.5–5.2)
Sodium: 142 mmol/L (ref 134–144)
Total Protein: 7 g/dL (ref 6.0–8.5)
eGFR: 101 mL/min/{1.73_m2} (ref >59)

## 2020-11-29 LAB — CBC WITH DIFFERENTIAL/PLATELET
Basophils Absolute: 0 10*3/uL (ref 0.0–0.2)
Basos: 0 %
EOS (ABSOLUTE): 0.1 10*3/uL (ref 0.0–0.4)
Eos: 2 %
Hematocrit: 39.4 % (ref 34.0–46.6)
Hemoglobin: 13.9 g/dL (ref 11.1–15.9)
Immature Grans (Abs): 0 10*3/uL (ref 0.0–0.1)
Immature Granulocytes: 0 %
Lymphocytes Absolute: 1.6 10*3/uL (ref 0.7–3.1)
Lymphs: 28 %
MCH: 31.6 pg (ref 26.6–33.0)
MCHC: 35.3 g/dL (ref 31.5–35.7)
MCV: 90 fL (ref 79–97)
Monocytes Absolute: 0.4 10*3/uL (ref 0.1–0.9)
Monocytes: 8 %
Neutrophils Absolute: 3.4 10*3/uL (ref 1.4–7.0)
Neutrophils: 62 %
Platelets: 291 10*3/uL (ref 150–450)
RBC: 4.4 x10E6/uL (ref 3.77–5.28)
RDW: 13.9 % (ref 11.7–15.4)
WBC: 5.6 10*3/uL (ref 3.4–10.8)

## 2020-11-29 NOTE — Telephone Encounter (Signed)
I called the patient, recent lab work shows elevated liver enzymes, AST 99, ALT 51, elevated more than normal.  About a year ago, AST was 50, ALT 35.  She has autoimmune hepatitis, is on Imuran, managed by GI.  No reason she can determine for the elevated liver enzymes, she does not drink alcohol, does report Tylenol use, but no more than 2 or 3 days a week.  We are trying to make a decision about MS medication.  She has most recently been on Copaxone, however the medication went generic, and she can no longer afford the co-pay.  We cannot consider Aubagio, due to her liver issues.  She claims she has previously been on Tecfidera and Betaseron, had elevated liver enzymes at that time.   I will route this to [place], one of our MS nurses to see if she knows of any funding that may be available to help with her co-pay to keep her on Copaxone.   The patient will reach out to Dr. Henrene Pastor her GI MD to let them know about liver enzymes.   I will send myself a reminder to check with her in 4 weeks, on our progress, if nothing else, recheck liver enzymes.

## 2020-12-04 MED ORDER — GLATIRAMER ACETATE 40 MG/ML ~~LOC~~ SOSY: 40.0000 mg | PREFILLED_SYRINGE | SUBCUTANEOUS | 12 refills | Status: DC

## 2020-12-04 NOTE — Telephone Encounter (Signed)
I have reached out to [facility] to find out if they have any suggestions on finding assistance for Copaxone.

## 2020-12-04 NOTE — Addendum Note (Signed)
Addended by: Lester Butler A on: 12/04/2020 09:23 AM   Modules accepted: Orders

## 2020-12-04 NOTE — Telephone Encounter (Signed)
[facility] informed me they are able to find patient assistance for Glatopa and glatiramer most of the time.  I called patient.  She is unable to afford brand-name Copaxone.  She is unsure if she will be able to afford generic Copaxone either but is willing to let [facility] investigate this further.  I will let her know of any outcome.  I spoke with Judson Roch, NP.  She is agreeable to trying generic Copaxone to see if this is more affordable.  Order has been placed and sent to [facility].

## 2020-12-11 NOTE — Telephone Encounter (Signed)
PA for glatiramer was approved by [employer].  [facility] pharmacy has been informed.

## 2020-12-11 NOTE — Telephone Encounter (Signed)
Received PA request from [facility].  Completed PA request for glatiramer via CMM.Key: [id number].  Sent to [employer].  Should have a determination in 3-5 business days.

## 2020-12-12 NOTE — Telephone Encounter (Signed)
I called patient.  I advised her to return [facility] pharmacy's phone call to discuss financial assistance for glatiramer.  I gave her their phone number.  She will call them today.  She will let me know if glatiramer is still too expensive.

## 2020-12-12 NOTE — Telephone Encounter (Signed)
Received this notice from [place] [facility]: "Looks like we tried to call Gabriela Rice this morning regarding financial assistance but were unable to reach her, so we left a voicemail for her to call us back."

## 2020-12-16 ENCOUNTER — Other Ambulatory Visit: Payer: Self-pay | Admitting: Neurology

## 2020-12-16 DIAGNOSIS — G35 Multiple sclerosis: Secondary | ICD-10-CM

## 2020-12-16 DIAGNOSIS — Z5181 Encounter for therapeutic drug level monitoring: Secondary | ICD-10-CM

## 2020-12-18 NOTE — Telephone Encounter (Signed)
I called patient.  She received her glatiramer shipment last week.  She will start the medication today.  She will let us know if she has questions or concerns.  I will check with her next week to make sure she tolerated it well.

## 2020-12-25 NOTE — Telephone Encounter (Signed)
I called patient.  She had her first glatiramer injection last week.  She tolerated it well.  She has no further questions or concerns for [place].  She will follow up with Dr. Jannifer Franklin as scheduled in September.

## 2020-12-27 ENCOUNTER — Other Ambulatory Visit: Payer: Self-pay | Admitting: Neurology

## 2020-12-27 ENCOUNTER — Encounter: Payer: Self-pay | Admitting: Neurology

## 2020-12-27 DIAGNOSIS — G35 Multiple sclerosis: Secondary | ICD-10-CM

## 2020-12-27 DIAGNOSIS — Z5181 Encounter for therapeutic drug level monitoring: Secondary | ICD-10-CM

## 2020-12-27 MED ORDER — TRAMADOL HCL 50 MG PO TABS: 50.0000 mg | ORAL_TABLET | Freq: Three times a day (TID) | ORAL | 0 refills | Status: DC | PRN

## 2021-01-01 ENCOUNTER — Telehealth: Payer: Self-pay | Admitting: Neurology

## 2021-01-01 NOTE — Telephone Encounter (Signed)
Had SS,NP sign order and faxed it back to [facility] at [phone]. Received fax confirmation.

## 2021-01-01 NOTE — Telephone Encounter (Signed)
Called back and spoke with [place]. She will re-fax form to [place] at [phone] for [place] to complete

## 2021-01-01 NOTE — Telephone Encounter (Signed)
[facility]) calling to verify if received the [place] patient enrollment form we faxed over on 12/29/20. Patient requesting a auto injector. Patient would be on Glatiramer Acetate and would need the whisper ject auto injector. Give [place] a call if you did not receive that or if you have any questions on filling out the form. The form does not require patient's signature.   Contact info: [phone]

## 2021-01-08 NOTE — Telephone Encounter (Signed)
Pt called, I do not think I need to sign anything. Would like a call from the nurse.

## 2021-01-08 NOTE — Telephone Encounter (Signed)
Returned patient's call.  Will leave paperwork up front for her husband to sign.

## 2021-01-08 NOTE — Telephone Encounter (Signed)
Received fax that another signature needed on [employer] form.  Called patient and she stated her husband would come by and sign for her as she is bedridden.    Pa;perwork placed at front desk.

## 2021-01-23 NOTE — Telephone Encounter (Signed)
Patient assistance [employer] application faxed.  OK transmission received.

## 2021-01-24 ENCOUNTER — Telehealth: Payer: Self-pay | Admitting: Neurology

## 2021-01-24 NOTE — Telephone Encounter (Signed)
I received a letter indicating that the patient is not eligible for patient assistance with regards to the use of Eliquis.

## 2021-01-24 NOTE — Telephone Encounter (Signed)
Received fax confirmation that patient does not qualify for patient assistance with [place].  Notification was also sent to patient by mail.  Dr. Jannifer Franklin given notification.  Patient sent MyChart message.

## 2021-01-30 ENCOUNTER — Other Ambulatory Visit: Payer: Self-pay | Admitting: Neurology

## 2021-01-30 DIAGNOSIS — G35 Multiple sclerosis: Secondary | ICD-10-CM

## 2021-01-30 DIAGNOSIS — Z5181 Encounter for therapeutic drug level monitoring: Secondary | ICD-10-CM

## 2021-01-30 MED ORDER — TRAMADOL HCL 50 MG PO TABS: 50.0000 mg | ORAL_TABLET | Freq: Three times a day (TID) | ORAL | 0 refills | Status: DC | PRN

## 2021-02-12 ENCOUNTER — Other Ambulatory Visit: Payer: Self-pay | Admitting: Neurology

## 2021-02-12 ENCOUNTER — Other Ambulatory Visit: Payer: Self-pay | Admitting: Internal Medicine

## 2021-03-08 ENCOUNTER — Other Ambulatory Visit (INDEPENDENT_AMBULATORY_CARE_PROVIDER_SITE_OTHER): Payer: PPO

## 2021-03-08 ENCOUNTER — Ambulatory Visit: Payer: PPO | Admitting: Internal Medicine

## 2021-03-08 ENCOUNTER — Encounter: Payer: Self-pay | Admitting: Internal Medicine

## 2021-03-08 VITALS — BP 114/84 | HR 108

## 2021-03-08 DIAGNOSIS — K754 Autoimmune hepatitis: Secondary | ICD-10-CM

## 2021-03-08 DIAGNOSIS — R7989 Other specified abnormal findings of blood chemistry: Secondary | ICD-10-CM | POA: Diagnosis not present

## 2021-03-08 DIAGNOSIS — K760 Fatty (change of) liver, not elsewhere classified: Secondary | ICD-10-CM | POA: Diagnosis not present

## 2021-03-08 LAB — COMPREHENSIVE METABOLIC PANEL
ALT: 34 U/L (ref 0–35)
AST: 39 U/L — ABNORMAL HIGH (ref 0–37)
Albumin: 4.4 g/dL (ref 3.5–5.2)
Alkaline Phosphatase: 69 U/L (ref 39–117)
BUN: 17 mg/dL (ref 6–23)
CO2: 29 mEq/L (ref 19–32)
Calcium: 10.1 mg/dL (ref 8.4–10.5)
Chloride: 102 mEq/L (ref 96–112)
Creatinine, Ser: 0.52 mg/dL (ref 0.40–1.20)
GFR: 97.9 mL/min (ref >60.00)
Glucose, Bld: 131 mg/dL — ABNORMAL HIGH (ref 70–99)
Potassium: 3.8 mEq/L (ref 3.5–5.1)
Sodium: 140 mEq/L (ref 135–145)
Total Bilirubin: 0.6 mg/dL (ref 0.2–1.2)
Total Protein: 7.5 g/dL (ref 6.0–8.3)

## 2021-03-08 LAB — CBC WITH DIFFERENTIAL/PLATELET
Basophils Absolute: 0 10*3/uL (ref 0.0–0.1)
Basophils Relative: 0.4 % (ref 0.0–3.0)
Eosinophils Absolute: 0.2 10*3/uL (ref 0.0–0.7)
Eosinophils Relative: 2.3 % (ref 0.0–5.0)
HCT: 40.5 % (ref 36.0–46.0)
Hemoglobin: 14.1 g/dL (ref 12.0–15.0)
Lymphocytes Relative: 25.9 % (ref 12.0–46.0)
Lymphs Abs: 1.7 10*3/uL (ref 0.7–4.0)
MCHC: 34.8 g/dL (ref 30.0–36.0)
MCV: 91.7 fl (ref 78.0–100.0)
Monocytes Absolute: 0.6 10*3/uL (ref 0.1–1.0)
Monocytes Relative: 9.3 % (ref 3.0–12.0)
Neutro Abs: 4.1 10*3/uL (ref 1.4–7.7)
Neutrophils Relative %: 62.1 % (ref 43.0–77.0)
Platelets: 283 10*3/uL (ref 150.0–400.0)
RBC: 4.42 Mil/uL (ref 3.87–5.11)
RDW: 15.6 % — ABNORMAL HIGH (ref 11.5–15.5)
WBC: 6.6 10*3/uL (ref 4.0–10.5)

## 2021-03-08 MED ORDER — AZATHIOPRINE 50 MG PO TABS: 150.0000 mg | ORAL_TABLET | Freq: Every day | ORAL | 3 refills | Status: DC

## 2021-03-08 NOTE — Patient Instructions (Signed)
If you are age 65 or older, your body mass index should be between 23-30. Your There is no height or weight on file to calculate BMI. If this is out of the aforementioned range listed, please consider follow up with your Primary Care Provider.  If you are age 80 or younger, your body mass index should be between 19-25. Your There is no height or weight on file to calculate BMI. If this is out of the aformentioned range listed, please consider follow up with your Primary Care Provider.   __________________________________________________________  The [facility] providers would like to encourage you to use [facility] to communicate with providers for non-urgent requests or questions.  Due to long hold times on the telephone, sending your provider a message by [facility] may be a faster and more efficient way to get a response.  Please allow 48 business hours for a response.  Please remember that this is for non-urgent requests.   Your provider has requested that you go to the basement level for lab work before leaving today. Press "B" on the elevator. The lab is located at the first door on the left as you exit the elevator.  We have sent the following medications to your pharmacy for you to pick up at your convenience:  Imuran

## 2021-03-08 NOTE — Progress Notes (Signed)
HISTORY OF PRESENT ILLNESS:  Gabriela Rice is a 65 y.o. female with a history of biopsy-proven fatty liver disease, subsequent biopsy supported autoimmune hepatitis on Imuran 150 mg daily, and heterozygous for 2 separate hemochromatosis mutations.  I last saw the patient 2019.  He was last seen in this office March 2021.  She is accompanied today by her husband.  She underwent blood work in March and was found to have elevated liver tests with AST 99, ALT 51, alkaline phosphatase 91, and total bilirubin 0.7.  Normal globulins.  She continues on Imuran 150 mg daily.  Unfortunately, she has had progressive significant decline in her health status since I last saw her.  Her multiple sclerosis is not well controlled.  She is wheelchair-bound.  She also has a stroke for which she is now on Eliquis.  Some residual left-sided weakness.  She is accompanied today by her husband.  No particular GI complaints.  Has questions over liver tests.  She has undergone prior colonoscopy in 2005 and 2011 with non-adenomatous polyps.  Previous problems with constipation have been successfully treated with MiraLAX.  REVIEW OF SYSTEMS:  All non-GI ROS negative unless otherwise stated in the HPI  Past Medical History:  Diagnosis Date   Abnormality of gait 02/16/2013   Acute bilateral ankle pain 11/15/2019   Allergic rhinitis    Allergy    Anxiety    Autoimmune hepatitis ([place])    Autoimmune hepatitis ([place])    Colon polyps    Diverticulosis    DM (diabetes mellitus) ([facility])    Elevated LFTs    Hemorrhoids    Hemorrhoids    Hepatitis    with cholestasis   Hyperlipidemia    Hypertension    Migraines    Multiple sclerosis ([place])    Obesity    Vitamin D deficiency     Past Surgical History:  Procedure Laterality Date   ABDOMINAL HYSTERECTOMY     CESAREAN SECTION     x 2   MYOECTOMY     TONSILLECTOMY AND ADENOIDECTOMY      Social History Gabriela Rice  reports that she quit smoking about 25 years ago.  Her smoking use included cigarettes. She has a 1.00 pack-year smoking history. She has never used smokeless tobacco. She reports that she does not drink alcohol and does not use drugs.  family history includes Brain cancer in her maternal uncle; Cirrhosis in her father; Colon cancer in her maternal aunt and maternal uncle; Diabetes in her mother; Heart disease in her maternal grandmother; Multiple sclerosis in her mother; Uterine cancer in her maternal aunt.  Allergies  Allergen Reactions   Penicillins Other (See Comments)    seizure as child Has patient had a PCN reaction causing immediate rash, facial/tongue/throat swelling, SOB or lightheadedness with hypotension: No Has patient had a PCN reaction causing severe rash involving mucus membranes or skin necrosis:No Has patient had a PCN reaction that required hospitalizationNo Has patient had a PCN reaction occurring within the last 10 years:No If all of the above answers are "NO", then may proceed with Cephalosporin use.     Atorvastatin     Other reaction(s): muscle pain   Crestor [Rosuvastatin]     Other reaction(s): Elevated LFT's   Lisinopril     Other reaction(s): cough   Simvastatin     Other reaction(s): muscle pain   Sulfamethoxazole-Trimethoprim     Other reaction(s): Dizziness, Loss of feeling in tongue   Codeine Swelling  hands swell       PHYSICAL EXAMINATION: Vital signs: BP 114/84 (BP Location: Left Arm, Patient Position: Sitting, Cuff Size: Normal)   Pulse (!) 108   Constitutional: Chronically ill-appearing, wheelchair-bound, no acute distress Psychiatric: alert and oriented x3, cooperative Eyes: extraocular movements intact, anicteric, conjunctiva pink Mouth: Mask Neck: supple no lymphadenopathy Cardiovascular: heart regular rate and rhythm, no murmur Lungs: clear to auscultation bilaterally Abdomen: soft, obese, nontender, nondistended, no obvious ascites, no peritoneal signs, normal bowel sounds, no  organomegaly Rectal: Omitted Extremities: no clubbing, cyanosis.  Trace lower extremity edema bilaterally Skin: no lesions on visible extremities Neuro: No asterixis  ASSESSMENT:  1.  Biopsy supported autoimmune hepatitis.  On Imuran 150 mg daily. 2.  Fatty liver biopsy 3.  Heterozygous for hemochromatosis mutations 4.  Colonoscopy 2005, 2011 both negative for neoplasia.  She is not appropriate candidate for repeat screening 5.  Multiple sclerosis.  Wheelchair-bound 6.  History of stroke chronic anticoagulation therapy 7.  Wheelchair-bound 8.  Mildly elevated hepatic transaminases.  Normal globulins.  Suspect fatty liver  PLAN:  1.  Comprehensive metabolic panel and CBC today 2.  Weight loss.  At this incredibly difficult given her overall clinical issues 3.  Continue Imuran 4.  Imuran prescription refilled.  Medication risks reviewed 5.  Periodic blood work to be determined 6.  Routine GI follow-up 1 year

## 2021-03-09 ENCOUNTER — Other Ambulatory Visit: Payer: Self-pay

## 2021-03-09 DIAGNOSIS — R7989 Other specified abnormal findings of blood chemistry: Secondary | ICD-10-CM

## 2021-05-03 ENCOUNTER — Other Ambulatory Visit: Payer: Self-pay

## 2021-05-03 DIAGNOSIS — Z5181 Encounter for therapeutic drug level monitoring: Secondary | ICD-10-CM

## 2021-05-03 DIAGNOSIS — G35 Multiple sclerosis: Secondary | ICD-10-CM

## 2021-05-03 MED ORDER — TRAMADOL HCL 50 MG PO TABS: 50.0000 mg | ORAL_TABLET | Freq: Three times a day (TID) | ORAL | 0 refills | Status: DC | PRN

## 2021-05-09 ENCOUNTER — Encounter: Payer: Self-pay | Admitting: Neurology

## 2021-05-09 ENCOUNTER — Telehealth: Payer: Self-pay | Admitting: Neurology

## 2021-05-09 NOTE — Telephone Encounter (Signed)
Pt is asking for a letter to be excused from [employer]

## 2021-05-09 NOTE — Telephone Encounter (Signed)
I called the patient and she would like the letter mailed. Verified her home address and mailed today.

## 2021-05-16 ENCOUNTER — Other Ambulatory Visit: Payer: Self-pay | Admitting: *Deleted

## 2021-05-16 MED ORDER — APIXABAN 5 MG PO TABS: ORAL_TABLET | ORAL | 1 refills | Status: DC

## 2021-05-17 NOTE — Telephone Encounter (Signed)
Dr Jannifer Franklin has signed a 2nd copy of jury release letter. Mailed it to patient today.

## 2021-06-14 ENCOUNTER — Ambulatory Visit: Payer: PPO | Admitting: Neurology

## 2021-06-18 ENCOUNTER — Telehealth: Payer: PPO | Admitting: Neurology

## 2021-06-18 ENCOUNTER — Encounter: Payer: Self-pay | Admitting: Neurology

## 2021-06-18 ENCOUNTER — Telehealth: Payer: Self-pay | Admitting: Neurology

## 2021-06-18 DIAGNOSIS — R269 Unspecified abnormalities of gait and mobility: Secondary | ICD-10-CM

## 2021-06-18 DIAGNOSIS — G35 Multiple sclerosis: Secondary | ICD-10-CM | POA: Diagnosis not present

## 2021-06-18 NOTE — Progress Notes (Signed)
      Virtual Visit via Telephone Note  I connected with Gabriela Rice on 06/18/21 at  1:30 PM EDT by telephone and verified that I am speaking with the correct person using two identifiers.  Location: Patient: The patient is at home. Provider: Physician in office.   I discussed the limitations, risks, security and privacy concerns of performing an evaluation and management service by telephone and the availability of in person appointments. I also discussed with the patient that there may be a patient responsible charge related to this service. The patient expressed understanding and agreed to proceed.   History of Present Illness: Gabriela Rice is a 65 year old right-handed white female with history of multiple sclerosis with secondary progressive features.  The patient has developed a significant paraparesis, she essentially has no use of the lower extremities.  The patient has neurogenic bowel and bladder.  She has ongoing incontinence of the bladder, her bowels are working better when she uses MiraLAX on a regular basis.  She still has fairly good use of her arms, her left hand is somewhat weaker than the right after her right brain stroke in December 2020.  She has autoimmune hepatitis and is on Imuran for this.  She takes Copaxone on a regular basis.  She has been vaccinated for COVID.  She last had blood work done in June 2022.  She spends a lot of time in bed, she denies any skin breakdown issues.  She does get up in a wheelchair during the day, she has to be strapped in as she has poor axial muscle control.  She reports no falls, she has not had any changes in vision.  She does not believe that she could maneuver a motorized wheelchair.  She has diabetes, her blood sugars are under good control.   Observations/Objective: The evaluation was done over the telephone, speech is well enunciated, not aphasic.  She appears to have normal cognitive function.  Assessment and Plan: 1.   Multiple sclerosis, secondary progressive  2.  Paraparesis  3.  Neurogenic bowel and bladder  The patient indicates that she is no longer having any significant pain issues.  She remains on Copaxone.  She has had gradual slow progression of her disease over time.  She will follow-up here in 6 months, in the future she can be followed through Dr. Felecia Shelling.  Follow Up Instructions:    I discussed the assessment and treatment plan with the patient. The patient was provided an opportunity to ask questions and all were answered. The patient agreed with the plan and demonstrated an understanding of the instructions.   The patient was advised to call back or seek an in-person evaluation if the symptoms worsen or if the condition fails to improve as anticipated.  I provided 22 minutes of non-face-to-face time during this encounter.   Kathrynn Ducking, MD

## 2021-06-18 NOTE — Telephone Encounter (Signed)
..   Pt understands that although there may be some limitations with this type of visit, we will take all precautions to reduce any security or privacy concerns.  Pt understands that this will be treated like an in office visit and we will file with pt's insurance, and there may be a patient responsible charge related to this service. ? ?

## 2021-08-02 ENCOUNTER — Telehealth: Payer: Self-pay

## 2021-08-02 ENCOUNTER — Other Ambulatory Visit: Payer: Self-pay | Admitting: Neurology

## 2021-08-02 DIAGNOSIS — G35 Multiple sclerosis: Secondary | ICD-10-CM

## 2021-08-02 DIAGNOSIS — Z5181 Encounter for therapeutic drug level monitoring: Secondary | ICD-10-CM

## 2021-08-02 NOTE — Telephone Encounter (Signed)
Form received and filed out, placed in Pod 2 for review and signature of Dr Felecia Shelling as care has been transferred to him from Dr Jannifer Franklin. Pt has a section to fill out herself and how to deliver medication if approved.  Please advise

## 2021-08-02 NOTE — Telephone Encounter (Signed)
Faxed in completed/signed provider portion of application to [phone]. Received fax confirmation.

## 2021-09-14 DIAGNOSIS — E782 Mixed hyperlipidemia: Secondary | ICD-10-CM | POA: Diagnosis not present

## 2021-09-14 DIAGNOSIS — G35 Multiple sclerosis: Secondary | ICD-10-CM | POA: Diagnosis not present

## 2021-09-14 DIAGNOSIS — E1169 Type 2 diabetes mellitus with other specified complication: Secondary | ICD-10-CM | POA: Diagnosis not present

## 2021-09-14 DIAGNOSIS — Z23 Encounter for immunization: Secondary | ICD-10-CM | POA: Diagnosis not present

## 2021-09-14 DIAGNOSIS — K754 Autoimmune hepatitis: Secondary | ICD-10-CM | POA: Diagnosis not present

## 2021-09-14 DIAGNOSIS — Z0000 Encounter for general adult medical examination without abnormal findings: Secondary | ICD-10-CM | POA: Diagnosis not present

## 2021-09-14 DIAGNOSIS — Z79899 Other long term (current) drug therapy: Secondary | ICD-10-CM | POA: Diagnosis not present

## 2021-09-14 DIAGNOSIS — E559 Vitamin D deficiency, unspecified: Secondary | ICD-10-CM | POA: Diagnosis not present

## 2021-09-14 DIAGNOSIS — F330 Major depressive disorder, recurrent, mild: Secondary | ICD-10-CM | POA: Diagnosis not present

## 2021-09-14 DIAGNOSIS — Z7984 Long term (current) use of oral hypoglycemic drugs: Secondary | ICD-10-CM | POA: Diagnosis not present

## 2021-09-14 DIAGNOSIS — E2839 Other primary ovarian failure: Secondary | ICD-10-CM | POA: Diagnosis not present

## 2021-09-14 DIAGNOSIS — I69354 Hemiplegia and hemiparesis following cerebral infarction affecting left non-dominant side: Secondary | ICD-10-CM | POA: Diagnosis not present

## 2021-09-14 DIAGNOSIS — I10 Essential (primary) hypertension: Secondary | ICD-10-CM | POA: Diagnosis not present

## 2021-09-18 ENCOUNTER — Other Ambulatory Visit: Payer: Self-pay | Admitting: Family Medicine

## 2021-09-18 DIAGNOSIS — E2839 Other primary ovarian failure: Secondary | ICD-10-CM

## 2021-09-19 DIAGNOSIS — J019 Acute sinusitis, unspecified: Secondary | ICD-10-CM | POA: Diagnosis not present

## 2021-09-19 DIAGNOSIS — H6121 Impacted cerumen, right ear: Secondary | ICD-10-CM | POA: Diagnosis not present

## 2021-09-19 DIAGNOSIS — Z7901 Long term (current) use of anticoagulants: Secondary | ICD-10-CM | POA: Diagnosis not present

## 2021-09-19 DIAGNOSIS — D692 Other nonthrombocytopenic purpura: Secondary | ICD-10-CM | POA: Diagnosis not present

## 2021-09-25 DIAGNOSIS — H6121 Impacted cerumen, right ear: Secondary | ICD-10-CM | POA: Diagnosis not present

## 2021-09-25 DIAGNOSIS — J019 Acute sinusitis, unspecified: Secondary | ICD-10-CM | POA: Diagnosis not present

## 2021-09-26 ENCOUNTER — Other Ambulatory Visit: Payer: Self-pay | Admitting: *Deleted

## 2021-09-26 ENCOUNTER — Encounter: Payer: Self-pay | Admitting: Neurology

## 2021-09-26 ENCOUNTER — Other Ambulatory Visit: Payer: Self-pay

## 2021-09-26 MED ORDER — ALPRAZOLAM 1 MG PO TABS: ORAL_TABLET | ORAL | 1 refills | Status: DC

## 2021-09-26 NOTE — Telephone Encounter (Signed)
[place] drug registry has been verified. Last refill was 06/05/21 # 90. Pt transitioning care from Dr. Jannifer Franklin to Dr. Felecia Shelling.

## 2021-10-11 DIAGNOSIS — E1169 Type 2 diabetes mellitus with other specified complication: Secondary | ICD-10-CM | POA: Diagnosis not present

## 2021-10-31 ENCOUNTER — Telehealth: Payer: Self-pay | Admitting: Neurology

## 2021-10-31 MED ORDER — GLATIRAMER ACETATE 40 MG/ML ~~LOC~~ SOSY: 40.0000 mg | PREFILLED_SYRINGE | SUBCUTANEOUS | 11 refills | Status: DC

## 2021-10-31 NOTE — Telephone Encounter (Signed)
Pt is requesting a refill for Glatiramer Acetate (COPAXONE) 40 MG/ML SOSY .  Pharmacy:  [facility]

## 2021-10-31 NOTE — Telephone Encounter (Signed)
Pending appt 12/18/21. Refills sent for continuation of meds.

## 2021-11-15 DIAGNOSIS — B078 Other viral warts: Secondary | ICD-10-CM | POA: Diagnosis not present

## 2021-11-15 DIAGNOSIS — I781 Nevus, non-neoplastic: Secondary | ICD-10-CM | POA: Diagnosis not present

## 2021-11-15 DIAGNOSIS — D1801 Hemangioma of skin and subcutaneous tissue: Secondary | ICD-10-CM | POA: Diagnosis not present

## 2021-12-17 NOTE — Progress Notes (Addendum)
? ? ?PATIENT: Gabriela Rice ?DOB: 08/27/1956 ? ?REASON FOR VISIT: Follow up for MS ?HISTORY FROM: Patient, husband ?PRIMARY NEUROLOGIST: Dr. Felecia Rice  ? ?ASSESSMENT AND PLAN ?66 y.o. year old female  ? ?1.  Multiple sclerosis, secondary progressive ?2.  Paraparesis ?3.  Neurogenic bowel and bladder ?Gabriela Rice looks well today, is always very pleasant, MS appears stable since last seen, gradual slow progression of her disease, is wheelchair bound, nonambulatory ?-Continue Copaxone ?-Check routine labs ?-Will continue chronic medications including gabapentin, baclofen, Xanax, tramadol ?-We discussed looking into options for in home health, right now her husband is her sole caregiver  ? ?4.  Autoimmune hepatitis ?-On Imuran, sees GI, will send over blood work from today's visit ? ?5.  History of stroke in 2020, right brain  ?-Continue Eliquis 5 mg twice a day ? ?HISTORY OF PRESENT ILLNESS: ?Today 12/18/21 ?Gabriela Rice here today for follow-up with her husband. Still on Copaxone, issue with funding in the past, gets help through drug company. Takes Xanax 1/2 tablet at bedtime PRN, take the other 1/2 during the day for anxiety. Takes Tramadol at least twice daily, for chronic back pain, also has gabapentin. On Eliquis with history of stroke. Takes baclofen for spasms. In wheelchair, has no just of lower extremities, wears strap to lower extremities always, sometimes to uppers. Her husband is her caregiver, does her ADLs.  She is incontinent of urine, is aware of bowel movement. She doesn't have an aide. Enjoys spending time with her family, grand children. Left arm is weaker than the right since her stroke in 2020. Uses hoyer lift at home.  ? ?HISTORY  ?06/18/2021 Dr. Jannifer Rice: Gabriela Rice is a 66 year old right-handed white female with history of multiple sclerosis with secondary progressive features.  The patient has developed a significant paraparesis, she essentially has no use of the lower extremities.  The patient has  neurogenic bowel and bladder.  She has ongoing incontinence of the bladder, her bowels are working better when she uses MiraLAX on a regular basis.  She still has fairly good use of her arms, her left hand is somewhat weaker than the right after her right brain stroke in December 2020.  She has autoimmune hepatitis and is on Imuran for this.  She takes Copaxone on a regular basis.  She has been vaccinated for COVID.  She last had blood work done in June 2022.  She spends a lot of time in bed, she denies any skin breakdown issues.  She does get up in a wheelchair during the day, she has to be strapped in as she has poor axial muscle control.  She reports no falls, she has not had any changes in vision.  She does not believe that she could maneuver a motorized wheelchair.  She has diabetes, her blood sugars are under good control ? ?REVIEW OF SYSTEMS: Out of a complete 14 system review of symptoms, the patient complains only of the following symptoms, and all other reviewed systems are negative. ? ?See HPI ? ?ALLERGIES: ?Allergies  ?Allergen Reactions  ? Penicillins Other (See Comments)  ?  seizure as child ?Has patient had a PCN reaction causing immediate rash, facial/tongue/throat swelling, SOB or lightheadedness with hypotension: No ?Has patient had a PCN reaction causing severe rash involving mucus membranes or skin necrosis:No ?Has patient had a PCN reaction that required hospitalizationNo ?Has patient had a PCN reaction occurring within the last 10 years:No ?If all of the above answers are "NO", then may proceed with  Cephalosporin use. ? ?  ? Atorvastatin   ?  Other reaction(s): muscle pain  ? Crestor [Rosuvastatin]   ?  Other reaction(s): Elevated LFT's  ? Lisinopril   ?  Other reaction(s): cough  ? Simvastatin   ?  Other reaction(s): muscle pain  ? Sulfamethoxazole-Trimethoprim   ?  Other reaction(s): Dizziness, Loss of feeling in tongue  ? Codeine Swelling  ?  hands swell  ? ? ?HOME MEDICATIONS: ?Outpatient  Medications Prior to Visit  ?Medication Sig Dispense Refill  ? ALPRAZolam (XANAX) 1 MG tablet TAKE 1 TABLET BY MOUTH AT BEDTIME AS NEEDED Must keep 06/04/21 appt for further refills 90 tablet 1  ? ALPRAZolam (XANAX) 1 MG tablet TAKE 1 TABLET BY MOUTH AT BEDTIME AS NEEDED. 90 tablet 1  ? azaTHIOprine (IMURAN) 50 MG tablet Take 3 tablets (150 mg total) by mouth daily. 270 tablet 3  ? cetirizine (ZYRTEC) 10 MG tablet Take 10 mg by mouth daily.    ? Cholecalciferol (VITAMIN D) 2000 UNITS CAPS Take 2,000 Units by mouth daily.     ? DULoxetine (CYMBALTA) 30 MG capsule Take 90 mg by mouth daily.     ? Glatiramer Acetate (COPAXONE) 40 MG/ML SOSY Inject 40 mg into the skin 3 (three) times a week. 12 mL 11  ? losartan (COZAAR) 100 MG tablet Take 100 mg by mouth daily.    ? metFORMIN (GLUCOPHAGE) 500 MG tablet Take 500 mg by mouth 2 (two) times daily with a meal.     ? polyethylene glycol powder (GLYCOLAX/MIRALAX) 17 GM/SCOOP powder Take 17 g by mouth daily as needed for mild constipation.    ? pravastatin (PRAVACHOL) 40 MG tablet Take 40 mg by mouth daily.     ? traMADol (ULTRAM) 50 MG tablet TAKE 1 TABLET(50 MG) BY MOUTH THREE TIMES DAILY AS NEEDED 270 tablet 1  ? TRULICITY 5.80 DX/8.3JA SOPN Inject 0.75 mg into the skin once a week.    ? apixaban (ELIQUIS) 5 MG TABS tablet TAKE 1 TABLET(5 MG) BY MOUTH TWICE DAILY 180 tablet 1  ? baclofen (LIORESAL) 10 MG tablet One tablet in the morning and evening and 1.5 tablets at midday 315 tablet 3  ? gabapentin (NEURONTIN) 300 MG capsule TAKE 1 CAPSULE BY MOUTH THREE TIMES A DAY 270 capsule 3  ? ?No facility-administered medications prior to visit.  ? ? ?PAST MEDICAL HISTORY: ?Past Medical History:  ?Diagnosis Date  ? Abnormality of gait 02/16/2013  ? Acute bilateral ankle pain 11/15/2019  ? Allergic rhinitis   ? Allergy   ? Anxiety   ? Autoimmune hepatitis ([place])   ? Autoimmune hepatitis ([place])   ? Colon polyps   ? Diverticulosis   ? DM (diabetes mellitus) ([place])   ? Elevated LFTs   ?  Hemorrhoids   ? Hemorrhoids   ? Hepatitis   ? with cholestasis  ? Hyperlipidemia   ? Hypertension   ? Migraines   ? Multiple sclerosis ([place])   ? Obesity   ? Vitamin D deficiency   ? ? ?PAST SURGICAL HISTORY: ?Past Surgical History:  ?Procedure Laterality Date  ? ABDOMINAL HYSTERECTOMY    ? CESAREAN SECTION    ? x 2  ? MYOECTOMY    ? TONSILLECTOMY AND ADENOIDECTOMY    ? ? ?FAMILY HISTORY: ?Family History  ?Problem Relation Age of Onset  ? Diabetes Mother   ? Multiple sclerosis Mother   ? Cirrhosis Father   ? Heart disease Maternal Grandmother   ? Colon cancer  Maternal Aunt   ? Uterine cancer Maternal Aunt   ? Brain cancer Maternal Uncle   ? Colon cancer Maternal Uncle   ? Breast cancer Neg Hx   ? ? ?SOCIAL HISTORY: ?Social History  ? ?Socioeconomic History  ? Marital status: Married  ?  Spouse name: Shanon Brow  ? Number of children: 2  ? Years of education: HS  ? Highest education level: Not on file  ?Occupational History  ? Occupation: Unemployed  ?Tobacco Use  ? Smoking status: Former  ?  Packs/day: 0.25  ?  Years: 4.00  ?  Pack years: 1.00  ?  Types: Cigarettes  ?  Quit date: 10/01/1995  ?  Years since quitting: 26.2  ? Smokeless tobacco: Never  ?Vaping Use  ? Vaping Use: Never used  ?Substance and Sexual Activity  ? Alcohol use: No  ?  Alcohol/week: 0.0 standard drinks  ? Drug use: No  ? Sexual activity: Not on file  ?Other Topics Concern  ? Not on file  ?Social History Narrative  ? Lives at home w/ her husband  ? Right-handed  ? Drinks 1 cup of coffee per day  ? ?Social Determinants of Health  ? ?Financial Resource Strain: Not on file  ?Food Insecurity: Not on file  ?Transportation Needs: Not on file  ?Physical Activity: Not on file  ?Stress: Not on file  ?Social Connections: Not on file  ?Intimate Partner Violence: Not on file  ? ? ?PHYSICAL EXAM ? ?Vitals:  ? 12/18/21 1420  ?BP: 138/87  ?Pulse: (!) 112  ?Height: '5\' 4"'$  (1.626 m)  ? ?Body mass index is 35.19 kg/m?. ? ?Generalized: Well developed, in no acute distress   ?Neurological examination  ?Mentation: Alert oriented to time, place, history taking. Follows all commands speech and language fluent ?Cranial nerve II-XII: Pupils were equal round reactive to light. Shea Stakes

## 2021-12-18 ENCOUNTER — Encounter: Payer: Self-pay | Admitting: Neurology

## 2021-12-18 ENCOUNTER — Ambulatory Visit: Payer: PPO | Admitting: Neurology

## 2021-12-18 VITALS — BP 138/87 | HR 112 | Ht 64.0 in

## 2021-12-18 DIAGNOSIS — E2839 Other primary ovarian failure: Secondary | ICD-10-CM | POA: Insufficient documentation

## 2021-12-18 DIAGNOSIS — N950 Postmenopausal bleeding: Secondary | ICD-10-CM | POA: Insufficient documentation

## 2021-12-18 DIAGNOSIS — I639 Cerebral infarction, unspecified: Secondary | ICD-10-CM | POA: Insufficient documentation

## 2021-12-18 DIAGNOSIS — G35 Multiple sclerosis: Secondary | ICD-10-CM

## 2021-12-18 DIAGNOSIS — Z1624 Resistance to multiple antibiotics: Secondary | ICD-10-CM | POA: Insufficient documentation

## 2021-12-18 DIAGNOSIS — G894 Chronic pain syndrome: Secondary | ICD-10-CM

## 2021-12-18 DIAGNOSIS — I69959 Hemiplegia and hemiparesis following unspecified cerebrovascular disease affecting unspecified side: Secondary | ICD-10-CM | POA: Insufficient documentation

## 2021-12-18 DIAGNOSIS — D692 Other nonthrombocytopenic purpura: Secondary | ICD-10-CM | POA: Insufficient documentation

## 2021-12-18 DIAGNOSIS — N763 Subacute and chronic vulvitis: Secondary | ICD-10-CM | POA: Insufficient documentation

## 2021-12-18 DIAGNOSIS — F330 Major depressive disorder, recurrent, mild: Secondary | ICD-10-CM | POA: Insufficient documentation

## 2021-12-18 DIAGNOSIS — A499 Bacterial infection, unspecified: Secondary | ICD-10-CM | POA: Insufficient documentation

## 2021-12-18 DIAGNOSIS — E1165 Type 2 diabetes mellitus with hyperglycemia: Secondary | ICD-10-CM | POA: Insufficient documentation

## 2021-12-18 DIAGNOSIS — D72819 Decreased white blood cell count, unspecified: Secondary | ICD-10-CM | POA: Insufficient documentation

## 2021-12-18 DIAGNOSIS — L219 Seborrheic dermatitis, unspecified: Secondary | ICD-10-CM | POA: Insufficient documentation

## 2021-12-18 DIAGNOSIS — F419 Anxiety disorder, unspecified: Secondary | ICD-10-CM | POA: Insufficient documentation

## 2021-12-18 DIAGNOSIS — Z8673 Personal history of transient ischemic attack (TIA), and cerebral infarction without residual deficits: Secondary | ICD-10-CM | POA: Insufficient documentation

## 2021-12-18 DIAGNOSIS — E559 Vitamin D deficiency, unspecified: Secondary | ICD-10-CM | POA: Insufficient documentation

## 2021-12-18 MED ORDER — BACLOFEN 10 MG PO TABS: ORAL_TABLET | ORAL | 3 refills | Status: DC

## 2021-12-18 MED ORDER — GABAPENTIN 300 MG PO CAPS
ORAL_CAPSULE | ORAL | 3 refills | Status: DC
Start: 2021-12-18 — End: 2022-12-26

## 2021-12-18 MED ORDER — APIXABAN 5 MG PO TABS: ORAL_TABLET | ORAL | 1 refills | Status: DC

## 2021-12-19 ENCOUNTER — Encounter: Payer: Self-pay | Admitting: Internal Medicine

## 2021-12-19 ENCOUNTER — Telehealth: Payer: Self-pay | Admitting: Neurology

## 2021-12-19 LAB — CBC WITH DIFFERENTIAL/PLATELET
Basophils Absolute: 0 10*3/uL (ref 0.0–0.2)
Basos: 0 %
EOS (ABSOLUTE): 0.1 10*3/uL (ref 0.0–0.4)
Eos: 2 %
Hematocrit: 44.2 % (ref 34.0–46.6)
Hemoglobin: 15.2 g/dL (ref 11.1–15.9)
Immature Grans (Abs): 0 10*3/uL (ref 0.0–0.1)
Immature Granulocytes: 0 %
Lymphocytes Absolute: 1.6 10*3/uL (ref 0.7–3.1)
Lymphs: 30 %
MCH: 31.4 pg (ref 26.6–33.0)
MCHC: 34.4 g/dL (ref 31.5–35.7)
MCV: 91 fL (ref 79–97)
Monocytes Absolute: 0.4 10*3/uL (ref 0.1–0.9)
Monocytes: 8 %
Neutrophils Absolute: 3.2 10*3/uL (ref 1.4–7.0)
Neutrophils: 60 %
Platelets: 306 10*3/uL (ref 150–450)
RBC: 4.84 x10E6/uL (ref 3.77–5.28)
RDW: 13.6 % (ref 11.7–15.4)
WBC: 5.3 10*3/uL (ref 3.4–10.8)

## 2021-12-19 LAB — COMPREHENSIVE METABOLIC PANEL
ALT: 42 IU/L — ABNORMAL HIGH (ref 0–32)
AST: 80 IU/L — ABNORMAL HIGH (ref 0–40)
Albumin/Globulin Ratio: 1.9 (ref 1.2–2.2)
Albumin: 4.6 g/dL (ref 3.8–4.8)
Alkaline Phosphatase: 74 IU/L (ref 44–121)
BUN/Creatinine Ratio: 28 (ref 12–28)
BUN: 15 mg/dL (ref 8–27)
Bilirubin Total: 0.7 mg/dL (ref 0.0–1.2)
CO2: 23 mmol/L (ref 20–29)
Calcium: 9.8 mg/dL (ref 8.7–10.3)
Chloride: 100 mmol/L (ref 96–106)
Creatinine, Ser: 0.54 mg/dL — ABNORMAL LOW (ref 0.57–1.00)
Globulin, Total: 2.4 g/dL (ref 1.5–4.5)
Glucose: 130 mg/dL — ABNORMAL HIGH (ref 70–99)
Potassium: 4.1 mmol/L (ref 3.5–5.2)
Sodium: 138 mmol/L (ref 134–144)
Total Protein: 7 g/dL (ref 6.0–8.5)
eGFR: 102 mL/min/{1.73_m2} (ref >59)

## 2021-12-19 NOTE — Telephone Encounter (Signed)
I spoke to the patient. She is aware of the lab results. She only takes Tylenol sparingly when her neck pain flares up. She may use it several days in a row then go weeks without any.  ? ?She is going to contact Dr. Blanch Media office today. She wishes to have an appt with him. She will discuss repeat labs with him. Aware Sarah's recommendation is for 6-8 weeks. ?

## 2021-12-19 NOTE — Telephone Encounter (Signed)
Please call the patient, CBC is normal, CMP shows elevated liver enzymes AST 80, ALT 42.  I will route to Dr. Henrene Pastor, gastroenterology (messages reports he is out of office), she has history of autoimmune hepatitis, is on Imuran. Please let the patient know. I would think this needs to be rechecked in the next 6-8 weeks. Make sure not taking too much Tylenol.  ?

## 2021-12-24 NOTE — Telephone Encounter (Signed)
Reviewed. ?No new recommendations. ?She is due for office follow up in 3 months, and should keep that appointment. ?Thanks, ?Dr. Henrene Pastor  ?

## 2021-12-25 ENCOUNTER — Other Ambulatory Visit: Payer: Self-pay

## 2021-12-25 DIAGNOSIS — K754 Autoimmune hepatitis: Secondary | ICD-10-CM

## 2021-12-25 DIAGNOSIS — R7989 Other specified abnormal findings of blood chemistry: Secondary | ICD-10-CM

## 2021-12-25 NOTE — Telephone Encounter (Signed)
I called patient, left a detailed message on her cell phone number per DPR with this recommendation from Dr. Henrene Pastor. I asked patient to call us back with questions or concerns. ?

## 2022-01-04 DIAGNOSIS — G8220 Paraplegia, unspecified: Secondary | ICD-10-CM | POA: Diagnosis not present

## 2022-01-04 DIAGNOSIS — I10 Essential (primary) hypertension: Secondary | ICD-10-CM | POA: Diagnosis not present

## 2022-01-04 DIAGNOSIS — G35 Multiple sclerosis: Secondary | ICD-10-CM | POA: Diagnosis not present

## 2022-01-04 DIAGNOSIS — I69334 Monoplegia of upper limb following cerebral infarction affecting left non-dominant side: Secondary | ICD-10-CM | POA: Diagnosis not present

## 2022-01-04 DIAGNOSIS — E785 Hyperlipidemia, unspecified: Secondary | ICD-10-CM | POA: Diagnosis not present

## 2022-01-04 DIAGNOSIS — D692 Other nonthrombocytopenic purpura: Secondary | ICD-10-CM | POA: Diagnosis not present

## 2022-01-04 DIAGNOSIS — I82509 Chronic embolism and thrombosis of unspecified deep veins of unspecified lower extremity: Secondary | ICD-10-CM | POA: Diagnosis not present

## 2022-01-04 DIAGNOSIS — K754 Autoimmune hepatitis: Secondary | ICD-10-CM | POA: Diagnosis not present

## 2022-01-04 DIAGNOSIS — E1159 Type 2 diabetes mellitus with other circulatory complications: Secondary | ICD-10-CM | POA: Diagnosis not present

## 2022-01-04 DIAGNOSIS — F330 Major depressive disorder, recurrent, mild: Secondary | ICD-10-CM | POA: Diagnosis not present

## 2022-01-04 DIAGNOSIS — E1169 Type 2 diabetes mellitus with other specified complication: Secondary | ICD-10-CM | POA: Diagnosis not present

## 2022-02-10 ENCOUNTER — Other Ambulatory Visit: Payer: Self-pay | Admitting: Neurology

## 2022-02-10 DIAGNOSIS — G35 Multiple sclerosis: Secondary | ICD-10-CM

## 2022-02-10 DIAGNOSIS — Z5181 Encounter for therapeutic drug level monitoring: Secondary | ICD-10-CM

## 2022-02-11 NOTE — Telephone Encounter (Signed)
Last OV was on 12/18/21.  ?Next OV is scheduled for 06/25/22.  ?Last RX was written on 11/05/21 for 270 tabs.  ? ?[place] [facility] Database has been reviewed. Please e-scribe as work in [place].  ?

## 2022-03-21 ENCOUNTER — Other Ambulatory Visit (INDEPENDENT_AMBULATORY_CARE_PROVIDER_SITE_OTHER): Payer: PPO

## 2022-03-21 ENCOUNTER — Encounter: Payer: Self-pay | Admitting: Internal Medicine

## 2022-03-21 ENCOUNTER — Ambulatory Visit: Payer: PPO | Admitting: Internal Medicine

## 2022-03-21 VITALS — BP 126/90 | HR 112

## 2022-03-21 DIAGNOSIS — K760 Fatty (change of) liver, not elsewhere classified: Secondary | ICD-10-CM | POA: Diagnosis not present

## 2022-03-21 DIAGNOSIS — K754 Autoimmune hepatitis: Secondary | ICD-10-CM | POA: Diagnosis not present

## 2022-03-21 DIAGNOSIS — R7989 Other specified abnormal findings of blood chemistry: Secondary | ICD-10-CM | POA: Diagnosis not present

## 2022-03-21 DIAGNOSIS — K5901 Slow transit constipation: Secondary | ICD-10-CM | POA: Diagnosis not present

## 2022-03-21 LAB — CBC WITH DIFFERENTIAL/PLATELET
Basophils Absolute: 0 10*3/uL (ref 0.0–0.1)
Basophils Relative: 0.5 % (ref 0.0–3.0)
Eosinophils Absolute: 0.2 10*3/uL (ref 0.0–0.7)
Eosinophils Relative: 3 % (ref 0.0–5.0)
HCT: 43.7 % (ref 36.0–46.0)
Hemoglobin: 14.7 g/dL (ref 12.0–15.0)
Lymphocytes Relative: 27.3 % (ref 12.0–46.0)
Lymphs Abs: 1.5 10*3/uL (ref 0.7–4.0)
MCHC: 33.6 g/dL (ref 30.0–36.0)
MCV: 93.6 fl (ref 78.0–100.0)
Monocytes Absolute: 0.3 10*3/uL (ref 0.1–1.0)
Monocytes Relative: 6.4 % (ref 3.0–12.0)
Neutro Abs: 3.4 10*3/uL (ref 1.4–7.7)
Neutrophils Relative %: 62.8 % (ref 43.0–77.0)
Platelets: 271 10*3/uL (ref 150.0–400.0)
RBC: 4.66 Mil/uL (ref 3.87–5.11)
RDW: 15.9 % — ABNORMAL HIGH (ref 11.5–15.5)
WBC: 5.4 10*3/uL (ref 4.0–10.5)

## 2022-03-21 LAB — COMPREHENSIVE METABOLIC PANEL
ALT: 38 U/L — ABNORMAL HIGH (ref 0–35)
AST: 63 U/L — ABNORMAL HIGH (ref 0–37)
Albumin: 4.3 g/dL (ref 3.5–5.2)
Alkaline Phosphatase: 64 U/L (ref 39–117)
BUN: 14 mg/dL (ref 6–23)
CO2: 29 mEq/L (ref 19–32)
Calcium: 9.8 mg/dL (ref 8.4–10.5)
Chloride: 101 mEq/L (ref 96–112)
Creatinine, Ser: 0.5 mg/dL (ref 0.40–1.20)
GFR: 98.11 mL/min (ref >60.00)
Glucose, Bld: 157 mg/dL — ABNORMAL HIGH (ref 70–99)
Potassium: 4 mEq/L (ref 3.5–5.1)
Sodium: 138 mEq/L (ref 135–145)
Total Bilirubin: 0.9 mg/dL (ref 0.2–1.2)
Total Protein: 7.2 g/dL (ref 6.0–8.3)

## 2022-03-21 LAB — PROTIME-INR
INR: 1.5 ratio — ABNORMAL HIGH (ref 0.8–1.0)
Prothrombin Time: 15.7 s — ABNORMAL HIGH (ref 9.6–13.1)

## 2022-03-21 NOTE — Progress Notes (Signed)
HISTORY OF PRESENT ILLNESS:  Gabriela Rice is a 66 y.o. female with a history of biopsy-proven fatty liver disease, subsequent biopsy supported autoimmune hepatitis on Imuran 150 mg daily, and heterozygous for 2 separate hemochromatosis mutations.  She was last seen in this office June 2022.  See that dictation for details.  The basic assessment and plan from that encounter: ASSESSMENT:   1.  Biopsy supported autoimmune hepatitis.  On Imuran 150 mg daily. 2.  Fatty liver biopsy biopsy 3.  Heterozygous for hemochromatosis mutations 4.  Colonoscopy 2005, 2011 both negative for neoplasia.  She is not appropriate candidate for repeat screening 5.  Multiple sclerosis.  Wheelchair-bound 6.  History of stroke chronic anticoagulation therapy 7.  Obesity 8.  Mildly elevated hepatic transaminases.  Normal globulins.  Suspect fatty liver   PLAN:   1.  Comprehensive metabolic panel and CBC today 2.  Weight loss.  At this incredibly difficult given her overall clinical issues 3.  Continue Imuran 4.  Imuran prescription refilled.  Medication risks reviewed 5.  Periodic blood work to be determined 6.  Routine GI follow-up 1 year   She underwent blood work in March and was found to have elevated liver tests with AST 80, ALT 42, alkaline phosphatase 74, total bilirubin 0.7.  Normal globulins.  Unremarkable CBC with normal hemoglobin and MCV.  Normal platelets.  She continues on Imuran 150 mg daily.  Unfortunately, she continues to have progressive significant decline in her health status since I last saw her.  Her multiple sclerosis is not well controlled.  She remains wheelchair-bound.  She also has a stroke for which she is remains on Eliquis.  Some residual left-sided weakness.  She is accompanied today by her husband.  No particular GI complaints.  She has questions over liver tests.  And whether she might take over-the-counter analgesics such as Tylenol.  She has undergone prior colonoscopy in 2005  and 2011 with non-adenomatous polyps.  Previous problems with constipation have been successfully treated with MiraLAX.  REVIEW OF SYSTEMS:  All non-GI ROS negative unless otherwise stated in the HPI except for sinus allergy trouble, anxiety, back pain, fatigue, itching, headaches, urinary leakage  Past Medical History:  Diagnosis Date   Abnormality of gait 02/16/2013   Acute bilateral ankle pain 11/15/2019   Allergic rhinitis    Allergy    Anxiety    Autoimmune hepatitis ([place])    Autoimmune hepatitis ([place])    Colon polyps    Diverticulosis    DM (diabetes mellitus) ([place])    Elevated LFTs    Hemorrhoids    Hemorrhoids    Hepatitis    with cholestasis   Hyperlipidemia    Hypertension    Migraines    Multiple sclerosis ([place])    Obesity    Vitamin D deficiency     Past Surgical History:  Procedure Laterality Date   ABDOMINAL HYSTERECTOMY     CESAREAN SECTION     x 2   MYOECTOMY     TONSILLECTOMY AND ADENOIDECTOMY      Social History Gabriela Rice  reports that she quit smoking about 26 years ago. Her smoking use included cigarettes. She has a 1.00 pack-year smoking history. She has never used smokeless tobacco. She reports that she does not drink alcohol and does not use drugs.  family history includes Brain cancer in her maternal uncle; Cirrhosis in her father; Colon cancer in her maternal aunt and maternal uncle; Diabetes in her mother; Heart disease  in her maternal grandmother; Multiple sclerosis in her mother; Uterine cancer in her maternal aunt.  Allergies  Allergen Reactions   Penicillins Other (See Comments)    seizure as child Has patient had a PCN reaction causing immediate rash, facial/tongue/throat swelling, SOB or lightheadedness with hypotension: No Has patient had a PCN reaction causing severe rash involving mucus membranes or skin necrosis:No Has patient had a PCN reaction that required hospitalizationNo Has patient had a PCN reaction occurring within  the last 10 years:No If all of the above answers are "NO", then may proceed with Cephalosporin use.     Crestor [Rosuvastatin]     Other reaction(s): Elevated LFT's   Lipitor [Atorvastatin]     Other reaction(s): muscle pain   Lisinopril     Other reaction(s): cough   Sulfamethoxazole-Trimethoprim     Other reaction(s): Dizziness, Loss of feeling in tongue   Zocor [Simvastatin]     Other reaction(s): muscle pain   Codeine Swelling    hands swell       PHYSICAL EXAMINATION: Vital signs: BP 126/90 (BP Location: Left Arm, Patient Position: Sitting, Cuff Size: Normal)   Pulse (!) 112   Constitutional: Pleasant female, no acute distress, in a wheelchair with a harness Psychiatric: alert and oriented x3, cooperative Eyes: extraocular movements intact, anicteric, conjunctiva pink Mouth: oral pharynx moist, no lesions Neck: supple no lymphadenopathy Cardiovascular: heart regular rate and rhythm, no murmur Lungs: clear to auscultation bilaterally Abdomen: soft, obese, nontender, nondistended, no obvious ascites, no peritoneal signs, normal bowel sounds, no organomegaly Rectal: Omitted Extremities: no clubbing or cyanosis.  Trace lower extremity edema bilaterally.  Strong grip Skin: no lesions on visible extremities Neuro: No focal deficits. No asterixis.     ASSESSMENT:   1.  Biopsy supported autoimmune hepatitis.  On Imuran 150 mg daily. 2.  Fatty liver on biopsy 3.  Heterozygous for hemochromatosis mutations 4.  Colonoscopy 2005, 2011 both negative for neoplasia.  She is not appropriate candidate for repeat screening 5.  Multiple sclerosis.  Wheelchair-bound 6.  History of stroke chronic anticoagulation therapy 7.  Obesity 8.  Mildly elevated hepatic transaminases.  Normal globulins.  Suspect fatty liver 9.  Constipation.  Treated with on-demand MiraLAX   PLAN:   1.  Comprehensive metabolic panel, PT/INR, and CBC today.  Also, check thiopurine metabolites 2.  Weight  loss.  At this incredibly difficult given her overall clinical issues 3.  Continue Imuran at current dosage 4.  Imuran prescription refilled.  Medication risks reviewed 5.  Periodic blood work to be determined 6.  Routine GI follow-up 1 year A total time of 30 minutes was spent preparing to see the patient, reviewing data, obtaining history, performing medically appropriate physical exam, counseling and educating the patient and her husband regarding her above listed issues, and documenting clinical information in the health record

## 2022-03-25 ENCOUNTER — Encounter: Payer: Self-pay | Admitting: Neurology

## 2022-03-26 ENCOUNTER — Telehealth: Payer: Self-pay | Admitting: Neurology

## 2022-03-26 NOTE — Telephone Encounter (Signed)
I called the patient. She needs evaluation for a new wheelchair, thinks she needs a higher back for more support. She will likely need to be seen within 30 days for home health to be able to come out for PT and w/c eval. Can we schedule her for VV with me?

## 2022-03-27 ENCOUNTER — Other Ambulatory Visit: Payer: Self-pay

## 2022-03-27 DIAGNOSIS — K754 Autoimmune hepatitis: Secondary | ICD-10-CM

## 2022-03-27 LAB — THIOPURINE METABOLITES
6 MMP(6-Methylmercaptopurine): 21480 pmol/8x10(8)RBC — ABNORMAL HIGH (ref <5700)
6 TG(6-Thioguanine): 220 pmol/8x10(8)RBC — ABNORMAL LOW (ref 235–400)

## 2022-04-08 ENCOUNTER — Encounter: Payer: Self-pay | Admitting: Neurology

## 2022-04-09 ENCOUNTER — Encounter: Payer: Self-pay | Admitting: Neurology

## 2022-04-09 ENCOUNTER — Telehealth (INDEPENDENT_AMBULATORY_CARE_PROVIDER_SITE_OTHER): Payer: PPO | Admitting: Neurology

## 2022-04-09 ENCOUNTER — Encounter: Payer: Self-pay | Admitting: *Deleted

## 2022-04-09 DIAGNOSIS — G35 Multiple sclerosis: Secondary | ICD-10-CM

## 2022-04-09 NOTE — Progress Notes (Signed)
   Virtual Visit via Video Note  I connected with Gabriela Rice on 04/09/22 at  1:15 PM EDT by a video enabled telemedicine application and verified that I am speaking with the correct person using two identifiers.  Location: Patient: at her home Provider: in the office    I discussed the limitations of evaluation and management by telemedicine and the availability of in person appointments. The patient expressed understanding and agreed to proceed.  History of Present Illness: 04/09/22 SS: Gabriela Rice is here today for evaluation in order to qualify for home health PT and evaluation for wheelchair. Currently has regular non-motorized wheelchair. Recently went for transport, she had a very hard time, her chair didn't provide upper body support. Needs ability to prop up legs. Has significant stiffness to extremity. Is non-ambulatory. Remains on Copaxone, gets through drug company. Recently her liver enzymes elevated, dose of Imuran was lowered. She straps her legs, and chest when in wheelchair. Would like a chair that can recline and help her head not flop.   12/18/2021 SS: Gabriela Rice here today for follow-up with her husband. Still on Copaxone, issue with funding in the past, gets help through drug company. Takes Xanax 1/2 tablet at bedtime PRN, take the other 1/2 during the day for anxiety. Takes Tramadol at least twice daily, for chronic back pain, also has gabapentin. On Eliquis with history of stroke. Takes baclofen for spasms. In wheelchair, has no just of lower extremities, wears strap to lower extremities always, sometimes to uppers. Her husband is her caregiver, does her ADLs.  She is incontinent of urine, is aware of bowel movement. She doesn't have an aide. Enjoys spending time with her family, grand children. Left arm is weaker than the right since her stroke in 2020. Uses hoyer lift at home.    Observations/Objective: Via virtual visit, In the bed, facial symmetry noted, able to move arms  well, speech is clear and concise, is nonambulatory  Assessment and Plan: 1.  Multiple sclerosis secondary progressive 2.  Paraparesis 3.  Neurogenic bowel and bladder 4.  Autoimmune hepatitis 5.  History of stroke in 2020, right brain -Order PT evaluation to evaluate and treat, specifically evaluate for new wheelchair for better support  Follow Up Instructions: Keep appointment 06/25/22 with Dr. Felecia Shelling to establish care    I discussed the assessment and treatment plan with the patient. The patient was provided an opportunity to ask questions and all were answered. The patient agreed with the plan and demonstrated an understanding of the instructions.   The patient was advised to call back or seek an in-person evaluation if the symptoms worsen or if the condition fails to improve as anticipated.  Evangeline Dakin, DNP  [facility] [address], [place], [place] [address] [phone]

## 2022-04-10 ENCOUNTER — Telehealth: Payer: Self-pay | Admitting: Neurology

## 2022-04-10 NOTE — Telephone Encounter (Signed)
[facility] is taking this patient. 

## 2022-04-11 ENCOUNTER — Telehealth: Payer: Self-pay | Admitting: Neurology

## 2022-04-11 NOTE — Telephone Encounter (Signed)
I spoke with Arbie Cookey from [facility] and provided a verbal order for evaluation tomorrow as per last visit note.

## 2022-04-11 NOTE — Telephone Encounter (Signed)
Gabriela Rice from [facility] called needing VO for evaluating pt tomorrow instead of today. Please advise.

## 2022-04-12 DIAGNOSIS — G8222 Paraplegia, incomplete: Secondary | ICD-10-CM | POA: Diagnosis not present

## 2022-04-12 DIAGNOSIS — N319 Neuromuscular dysfunction of bladder, unspecified: Secondary | ICD-10-CM | POA: Diagnosis not present

## 2022-04-12 DIAGNOSIS — K592 Neurogenic bowel, not elsewhere classified: Secondary | ICD-10-CM | POA: Diagnosis not present

## 2022-04-12 DIAGNOSIS — Z794 Long term (current) use of insulin: Secondary | ICD-10-CM | POA: Diagnosis not present

## 2022-04-12 DIAGNOSIS — Z7984 Long term (current) use of oral hypoglycemic drugs: Secondary | ICD-10-CM | POA: Diagnosis not present

## 2022-04-12 DIAGNOSIS — G8929 Other chronic pain: Secondary | ICD-10-CM | POA: Diagnosis not present

## 2022-04-12 DIAGNOSIS — Z7985 Long-term (current) use of injectable non-insulin antidiabetic drugs: Secondary | ICD-10-CM | POA: Diagnosis not present

## 2022-04-12 DIAGNOSIS — Z79891 Long term (current) use of opiate analgesic: Secondary | ICD-10-CM | POA: Diagnosis not present

## 2022-04-12 DIAGNOSIS — Z7901 Long term (current) use of anticoagulants: Secondary | ICD-10-CM | POA: Diagnosis not present

## 2022-04-12 DIAGNOSIS — G35 Multiple sclerosis: Secondary | ICD-10-CM | POA: Diagnosis not present

## 2022-04-12 DIAGNOSIS — K754 Autoimmune hepatitis: Secondary | ICD-10-CM | POA: Diagnosis not present

## 2022-04-12 DIAGNOSIS — M549 Dorsalgia, unspecified: Secondary | ICD-10-CM | POA: Diagnosis not present

## 2022-04-12 DIAGNOSIS — I69334 Monoplegia of upper limb following cerebral infarction affecting left non-dominant side: Secondary | ICD-10-CM | POA: Diagnosis not present

## 2022-04-19 DIAGNOSIS — Z7901 Long term (current) use of anticoagulants: Secondary | ICD-10-CM | POA: Diagnosis not present

## 2022-04-19 DIAGNOSIS — G35 Multiple sclerosis: Secondary | ICD-10-CM | POA: Diagnosis not present

## 2022-04-19 DIAGNOSIS — E782 Mixed hyperlipidemia: Secondary | ICD-10-CM | POA: Diagnosis not present

## 2022-04-19 DIAGNOSIS — G8222 Paraplegia, incomplete: Secondary | ICD-10-CM | POA: Diagnosis not present

## 2022-04-19 DIAGNOSIS — Z79891 Long term (current) use of opiate analgesic: Secondary | ICD-10-CM | POA: Diagnosis not present

## 2022-04-19 DIAGNOSIS — Z993 Dependence on wheelchair: Secondary | ICD-10-CM | POA: Diagnosis not present

## 2022-04-19 DIAGNOSIS — E559 Vitamin D deficiency, unspecified: Secondary | ICD-10-CM | POA: Diagnosis not present

## 2022-04-19 DIAGNOSIS — M5090 Cervical disc disorder, unspecified, unspecified cervical region: Secondary | ICD-10-CM | POA: Diagnosis not present

## 2022-04-19 DIAGNOSIS — E1169 Type 2 diabetes mellitus with other specified complication: Secondary | ICD-10-CM | POA: Diagnosis not present

## 2022-04-19 DIAGNOSIS — F330 Major depressive disorder, recurrent, mild: Secondary | ICD-10-CM | POA: Diagnosis not present

## 2022-04-19 DIAGNOSIS — Z7985 Long-term (current) use of injectable non-insulin antidiabetic drugs: Secondary | ICD-10-CM | POA: Diagnosis not present

## 2022-04-19 DIAGNOSIS — K754 Autoimmune hepatitis: Secondary | ICD-10-CM | POA: Diagnosis not present

## 2022-04-19 DIAGNOSIS — Z6835 Body mass index (BMI) 35.0-35.9, adult: Secondary | ICD-10-CM | POA: Diagnosis not present

## 2022-04-19 DIAGNOSIS — E2839 Other primary ovarian failure: Secondary | ICD-10-CM | POA: Diagnosis not present

## 2022-04-19 DIAGNOSIS — I69334 Monoplegia of upper limb following cerebral infarction affecting left non-dominant side: Secondary | ICD-10-CM | POA: Diagnosis not present

## 2022-04-19 DIAGNOSIS — G8929 Other chronic pain: Secondary | ICD-10-CM | POA: Diagnosis not present

## 2022-04-19 DIAGNOSIS — N319 Neuromuscular dysfunction of bladder, unspecified: Secondary | ICD-10-CM | POA: Diagnosis not present

## 2022-04-19 DIAGNOSIS — I10 Essential (primary) hypertension: Secondary | ICD-10-CM | POA: Diagnosis not present

## 2022-04-19 DIAGNOSIS — Z794 Long term (current) use of insulin: Secondary | ICD-10-CM | POA: Diagnosis not present

## 2022-04-19 DIAGNOSIS — Z7984 Long term (current) use of oral hypoglycemic drugs: Secondary | ICD-10-CM | POA: Diagnosis not present

## 2022-04-19 DIAGNOSIS — M47816 Spondylosis without myelopathy or radiculopathy, lumbar region: Secondary | ICD-10-CM | POA: Diagnosis not present

## 2022-04-19 DIAGNOSIS — K592 Neurogenic bowel, not elsewhere classified: Secondary | ICD-10-CM | POA: Diagnosis not present

## 2022-04-22 ENCOUNTER — Encounter: Payer: Self-pay | Admitting: Internal Medicine

## 2022-04-25 DIAGNOSIS — K592 Neurogenic bowel, not elsewhere classified: Secondary | ICD-10-CM | POA: Diagnosis not present

## 2022-04-25 DIAGNOSIS — E1169 Type 2 diabetes mellitus with other specified complication: Secondary | ICD-10-CM | POA: Diagnosis not present

## 2022-04-25 DIAGNOSIS — Z993 Dependence on wheelchair: Secondary | ICD-10-CM | POA: Diagnosis not present

## 2022-04-25 DIAGNOSIS — E559 Vitamin D deficiency, unspecified: Secondary | ICD-10-CM | POA: Diagnosis not present

## 2022-04-25 DIAGNOSIS — M5090 Cervical disc disorder, unspecified, unspecified cervical region: Secondary | ICD-10-CM | POA: Diagnosis not present

## 2022-04-25 DIAGNOSIS — Z7985 Long-term (current) use of injectable non-insulin antidiabetic drugs: Secondary | ICD-10-CM | POA: Diagnosis not present

## 2022-04-25 DIAGNOSIS — N319 Neuromuscular dysfunction of bladder, unspecified: Secondary | ICD-10-CM | POA: Diagnosis not present

## 2022-04-25 DIAGNOSIS — Z7984 Long term (current) use of oral hypoglycemic drugs: Secondary | ICD-10-CM | POA: Diagnosis not present

## 2022-04-25 DIAGNOSIS — G35 Multiple sclerosis: Secondary | ICD-10-CM | POA: Diagnosis not present

## 2022-04-25 DIAGNOSIS — M47816 Spondylosis without myelopathy or radiculopathy, lumbar region: Secondary | ICD-10-CM | POA: Diagnosis not present

## 2022-04-25 DIAGNOSIS — Z6835 Body mass index (BMI) 35.0-35.9, adult: Secondary | ICD-10-CM | POA: Diagnosis not present

## 2022-04-25 DIAGNOSIS — F330 Major depressive disorder, recurrent, mild: Secondary | ICD-10-CM | POA: Diagnosis not present

## 2022-04-25 DIAGNOSIS — E2839 Other primary ovarian failure: Secondary | ICD-10-CM | POA: Diagnosis not present

## 2022-04-25 DIAGNOSIS — G8929 Other chronic pain: Secondary | ICD-10-CM | POA: Diagnosis not present

## 2022-04-25 DIAGNOSIS — Z794 Long term (current) use of insulin: Secondary | ICD-10-CM | POA: Diagnosis not present

## 2022-04-25 DIAGNOSIS — I10 Essential (primary) hypertension: Secondary | ICD-10-CM | POA: Diagnosis not present

## 2022-04-25 DIAGNOSIS — Z7901 Long term (current) use of anticoagulants: Secondary | ICD-10-CM | POA: Diagnosis not present

## 2022-04-25 DIAGNOSIS — I69334 Monoplegia of upper limb following cerebral infarction affecting left non-dominant side: Secondary | ICD-10-CM | POA: Diagnosis not present

## 2022-04-25 DIAGNOSIS — K754 Autoimmune hepatitis: Secondary | ICD-10-CM | POA: Diagnosis not present

## 2022-04-25 DIAGNOSIS — G8222 Paraplegia, incomplete: Secondary | ICD-10-CM | POA: Diagnosis not present

## 2022-04-25 DIAGNOSIS — Z79891 Long term (current) use of opiate analgesic: Secondary | ICD-10-CM | POA: Diagnosis not present

## 2022-04-25 DIAGNOSIS — E782 Mixed hyperlipidemia: Secondary | ICD-10-CM | POA: Diagnosis not present

## 2022-05-02 DIAGNOSIS — Z6835 Body mass index (BMI) 35.0-35.9, adult: Secondary | ICD-10-CM | POA: Diagnosis not present

## 2022-05-02 DIAGNOSIS — E782 Mixed hyperlipidemia: Secondary | ICD-10-CM | POA: Diagnosis not present

## 2022-05-02 DIAGNOSIS — M47816 Spondylosis without myelopathy or radiculopathy, lumbar region: Secondary | ICD-10-CM | POA: Diagnosis not present

## 2022-05-02 DIAGNOSIS — Z993 Dependence on wheelchair: Secondary | ICD-10-CM | POA: Diagnosis not present

## 2022-05-02 DIAGNOSIS — I10 Essential (primary) hypertension: Secondary | ICD-10-CM | POA: Diagnosis not present

## 2022-05-02 DIAGNOSIS — G8222 Paraplegia, incomplete: Secondary | ICD-10-CM | POA: Diagnosis not present

## 2022-05-02 DIAGNOSIS — I69334 Monoplegia of upper limb following cerebral infarction affecting left non-dominant side: Secondary | ICD-10-CM | POA: Diagnosis not present

## 2022-05-02 DIAGNOSIS — G8929 Other chronic pain: Secondary | ICD-10-CM | POA: Diagnosis not present

## 2022-05-02 DIAGNOSIS — N319 Neuromuscular dysfunction of bladder, unspecified: Secondary | ICD-10-CM | POA: Diagnosis not present

## 2022-05-02 DIAGNOSIS — F330 Major depressive disorder, recurrent, mild: Secondary | ICD-10-CM | POA: Diagnosis not present

## 2022-05-02 DIAGNOSIS — Z794 Long term (current) use of insulin: Secondary | ICD-10-CM | POA: Diagnosis not present

## 2022-05-02 DIAGNOSIS — Z79891 Long term (current) use of opiate analgesic: Secondary | ICD-10-CM | POA: Diagnosis not present

## 2022-05-02 DIAGNOSIS — E2839 Other primary ovarian failure: Secondary | ICD-10-CM | POA: Diagnosis not present

## 2022-05-02 DIAGNOSIS — K592 Neurogenic bowel, not elsewhere classified: Secondary | ICD-10-CM | POA: Diagnosis not present

## 2022-05-02 DIAGNOSIS — Z7901 Long term (current) use of anticoagulants: Secondary | ICD-10-CM | POA: Diagnosis not present

## 2022-05-02 DIAGNOSIS — Z7984 Long term (current) use of oral hypoglycemic drugs: Secondary | ICD-10-CM | POA: Diagnosis not present

## 2022-05-02 DIAGNOSIS — E559 Vitamin D deficiency, unspecified: Secondary | ICD-10-CM | POA: Diagnosis not present

## 2022-05-02 DIAGNOSIS — E1169 Type 2 diabetes mellitus with other specified complication: Secondary | ICD-10-CM | POA: Diagnosis not present

## 2022-05-02 DIAGNOSIS — M5090 Cervical disc disorder, unspecified, unspecified cervical region: Secondary | ICD-10-CM | POA: Diagnosis not present

## 2022-05-02 DIAGNOSIS — G35 Multiple sclerosis: Secondary | ICD-10-CM | POA: Diagnosis not present

## 2022-05-02 DIAGNOSIS — K754 Autoimmune hepatitis: Secondary | ICD-10-CM | POA: Diagnosis not present

## 2022-05-02 DIAGNOSIS — Z7985 Long-term (current) use of injectable non-insulin antidiabetic drugs: Secondary | ICD-10-CM | POA: Diagnosis not present

## 2022-05-20 DIAGNOSIS — G35 Multiple sclerosis: Secondary | ICD-10-CM | POA: Diagnosis not present

## 2022-05-24 ENCOUNTER — Other Ambulatory Visit: Payer: Self-pay | Admitting: Internal Medicine

## 2022-05-24 ENCOUNTER — Other Ambulatory Visit: Payer: Self-pay | Admitting: Neurology

## 2022-05-24 ENCOUNTER — Other Ambulatory Visit: Payer: Self-pay | Admitting: Diagnostic Neuroimaging

## 2022-05-24 DIAGNOSIS — Z5181 Encounter for therapeutic drug level monitoring: Secondary | ICD-10-CM

## 2022-05-24 DIAGNOSIS — G35 Multiple sclerosis: Secondary | ICD-10-CM

## 2022-05-27 NOTE — Telephone Encounter (Signed)
Last seen 04/09/22 and next follow up 06/25/22. Checked drug registry. Last refilled 01/23/22 #90.

## 2022-05-29 DIAGNOSIS — Z7985 Long-term (current) use of injectable non-insulin antidiabetic drugs: Secondary | ICD-10-CM | POA: Diagnosis not present

## 2022-05-29 DIAGNOSIS — F330 Major depressive disorder, recurrent, mild: Secondary | ICD-10-CM | POA: Diagnosis not present

## 2022-05-29 DIAGNOSIS — Z79891 Long term (current) use of opiate analgesic: Secondary | ICD-10-CM | POA: Diagnosis not present

## 2022-05-29 DIAGNOSIS — E782 Mixed hyperlipidemia: Secondary | ICD-10-CM | POA: Diagnosis not present

## 2022-05-29 DIAGNOSIS — K592 Neurogenic bowel, not elsewhere classified: Secondary | ICD-10-CM | POA: Diagnosis not present

## 2022-05-29 DIAGNOSIS — G8929 Other chronic pain: Secondary | ICD-10-CM | POA: Diagnosis not present

## 2022-05-29 DIAGNOSIS — E2839 Other primary ovarian failure: Secondary | ICD-10-CM | POA: Diagnosis not present

## 2022-05-29 DIAGNOSIS — Z7984 Long term (current) use of oral hypoglycemic drugs: Secondary | ICD-10-CM | POA: Diagnosis not present

## 2022-05-29 DIAGNOSIS — G8222 Paraplegia, incomplete: Secondary | ICD-10-CM | POA: Diagnosis not present

## 2022-05-29 DIAGNOSIS — I69334 Monoplegia of upper limb following cerebral infarction affecting left non-dominant side: Secondary | ICD-10-CM | POA: Diagnosis not present

## 2022-05-29 DIAGNOSIS — E559 Vitamin D deficiency, unspecified: Secondary | ICD-10-CM | POA: Diagnosis not present

## 2022-05-29 DIAGNOSIS — Z993 Dependence on wheelchair: Secondary | ICD-10-CM | POA: Diagnosis not present

## 2022-05-29 DIAGNOSIS — G35 Multiple sclerosis: Secondary | ICD-10-CM | POA: Diagnosis not present

## 2022-05-29 DIAGNOSIS — I10 Essential (primary) hypertension: Secondary | ICD-10-CM | POA: Diagnosis not present

## 2022-05-29 DIAGNOSIS — N319 Neuromuscular dysfunction of bladder, unspecified: Secondary | ICD-10-CM | POA: Diagnosis not present

## 2022-05-29 DIAGNOSIS — K754 Autoimmune hepatitis: Secondary | ICD-10-CM | POA: Diagnosis not present

## 2022-05-29 DIAGNOSIS — M47816 Spondylosis without myelopathy or radiculopathy, lumbar region: Secondary | ICD-10-CM | POA: Diagnosis not present

## 2022-05-29 DIAGNOSIS — Z7901 Long term (current) use of anticoagulants: Secondary | ICD-10-CM | POA: Diagnosis not present

## 2022-05-29 DIAGNOSIS — E1169 Type 2 diabetes mellitus with other specified complication: Secondary | ICD-10-CM | POA: Diagnosis not present

## 2022-05-29 DIAGNOSIS — M5090 Cervical disc disorder, unspecified, unspecified cervical region: Secondary | ICD-10-CM | POA: Diagnosis not present

## 2022-05-29 DIAGNOSIS — Z794 Long term (current) use of insulin: Secondary | ICD-10-CM | POA: Diagnosis not present

## 2022-05-29 DIAGNOSIS — Z6835 Body mass index (BMI) 35.0-35.9, adult: Secondary | ICD-10-CM | POA: Diagnosis not present

## 2022-06-20 ENCOUNTER — Other Ambulatory Visit: Payer: Self-pay

## 2022-06-25 ENCOUNTER — Ambulatory Visit: Payer: PPO | Admitting: Neurology

## 2022-06-25 ENCOUNTER — Encounter: Payer: Self-pay | Admitting: Neurology

## 2022-06-25 ENCOUNTER — Other Ambulatory Visit (INDEPENDENT_AMBULATORY_CARE_PROVIDER_SITE_OTHER): Payer: PPO

## 2022-06-25 VITALS — BP 143/100 | HR 105 | Ht 63.75 in

## 2022-06-25 DIAGNOSIS — G35 Multiple sclerosis: Secondary | ICD-10-CM

## 2022-06-25 DIAGNOSIS — Z993 Dependence on wheelchair: Secondary | ICD-10-CM

## 2022-06-25 DIAGNOSIS — I63411 Cerebral infarction due to embolism of right middle cerebral artery: Secondary | ICD-10-CM

## 2022-06-25 DIAGNOSIS — R32 Unspecified urinary incontinence: Secondary | ICD-10-CM | POA: Diagnosis not present

## 2022-06-25 DIAGNOSIS — K754 Autoimmune hepatitis: Secondary | ICD-10-CM | POA: Diagnosis not present

## 2022-06-25 LAB — HEPATIC FUNCTION PANEL
ALT: 27 U/L (ref 0–35)
AST: 40 U/L — ABNORMAL HIGH (ref 0–37)
Albumin: 4.3 g/dL (ref 3.5–5.2)
Alkaline Phosphatase: 62 U/L (ref 39–117)
Bilirubin, Direct: 0.2 mg/dL (ref 0.0–0.3)
Total Bilirubin: 0.7 mg/dL (ref 0.2–1.2)
Total Protein: 7.6 g/dL (ref 6.0–8.3)

## 2022-06-25 NOTE — Progress Notes (Signed)
[facility]  PATIENT: Gabriela Rice DOB: May 19, 1956  REFERRING DOCTOR OR PCP:   Jonathon Jordan, MD SOURCE: Patient, notes from Dr. Jannifer Franklin, imaging and lab reports, MRI images personally reviewed.  _________________________________   HISTORICAL  CHIEF COMPLAINT:  Chief Complaint  Patient presents with   Follow-up    Pt with sister in law. Rm 1. DMT Glatiramer-Here to establish with Dr Felecia Shelling. She has been working on trying to get a new wheelchair. Completed a video visit in July to initiate [facility] therapy to work up for a wheelchair. She has been working with [employer] and they have not gotten her set up. She is trying to get a power wheelchair. (Which thinks was ordered by PCP)     HISTORY OF PRESENT ILLNESS:  I had the pleasure of seeing your patient, Gabriela Rice, at the [place] [facility] for neurologic consultation regarding her multiple sclerosis.  She had been seeing Dr. Floyde Parkins and is transferring care upon his retirement.  She is a 66 yo woman who was diagnosed with MS in 1998 afer presenting with slurred speech, weakness and poor gait.   She saw Dr. Jannifer Franklin initially and was placed on Betaseron.  Her LFTs increased and she discontinued.   She took nothing for several years and then started Copaxone around 2000 or 2001.   At that time, she was walking fairly well.   She began to have more difficulty with her gait and needed a cane by 2010 and a walker a few years later.    After she broke her ankles in August  2020 she became unable to walk and has been wheelchair bound since.   She had a stroke December 2020 and had more lest arm weakness and slurred speech.    Her strength improved but not to baseline.   The embolic right parietal stroke was felt to be due to PFO and DVT in leg.   Since the stroke, she has been on Eliquis  She reports her PCP is working to get her a power wheelchair..  She reports having a physical therapy  appointment soon  Currently, she is wheelchair bound and does not take steps.    Both legs  are weak, left worse than rigtht.  Can not bear weight.   She has some spasticity in legs.   She has just mild weakness in the left arm.  She has numbness in her legs.   She has urinary incontinence.   She cannot get to a toilet so needs to use Depends.    Her vision is ok.   She has never had ON.  She has fatigue.  She sleeps well most nights.   She notes mild reduced short term memory.   She has no trouble coming up with the right word.    She has some depression and is sometimes irritable.     She has autoimmune hepatitis and is on azathioprine  She is on gabapentin and trmadol for pain.      REVIEW OF SYSTEMS: Constitutional: No fevers, chills, sweats, or change in appetite.  She has fatigue. Eyes: No visual changes, double vision, eye pain Ear, nose and throat: No hearing loss, ear pain, nasal congestion, sore throat Cardiovascular: No chest pain, palpitations Respiratory:  No shortness of breath at rest or with exertion.   No wheezes GastrointestinaI: No nausea, vomiting, diarrhea, abdominal pain, fecal incontinence.  She has autoimmune hepatitis. Genitourinary:  No dysuria, urinary retention  or frequency.  No nocturia. Musculoskeletal:  No neck pain, back pain Integumentary: No rash, pruritus, skin lesions Neurological: as above Psychiatric: Notes mild depression at this time.  No anxiety Endocrine: No palpitations, diaphoresis, change in appetite, change in weigh or increased thirst Hematologic/Lymphatic:  No anemia, purpura, petechiae. Allergic/Immunologic: No itchy/runny eyes, nasal congestion, recent allergic reactions, rashes  ALLERGIES: Allergies  Allergen Reactions   Penicillins Other (See Comments)    seizure as child Has patient had a PCN reaction causing immediate rash, facial/tongue/throat swelling, SOB or lightheadedness with hypotension: No Has patient had a PCN reaction  causing severe rash involving mucus membranes or skin necrosis:No Has patient had a PCN reaction that required hospitalizationNo Has patient had a PCN reaction occurring within the last 10 years:No If all of the above answers are "NO", then may proceed with Cephalosporin use.     Crestor [Rosuvastatin]     Other reaction(s): Elevated LFT's   Lipitor [Atorvastatin]     Other reaction(s): muscle pain   Lisinopril     Other reaction(s): cough   Sulfamethoxazole-Trimethoprim     Other reaction(s): Dizziness, Loss of feeling in tongue   Zocor [Simvastatin]     Other reaction(s): muscle pain   Codeine Swelling    hands swell    HOME MEDICATIONS:  Current Outpatient Medications:    ALPRAZolam (XANAX) 1 MG tablet, TAKE 1 TABLET BY MOUTH AT BEDTIME AS NEEDED, Disp: 90 tablet, Rfl: 0   apixaban (ELIQUIS) 5 MG TABS tablet, TAKE 1 TABLET(5 MG) BY MOUTH TWICE DAILY, Disp: 180 tablet, Rfl: 1   azaTHIOprine (IMURAN) 50 MG tablet, TAKE 3 TABLETS(150 MG) BY MOUTH DAILY, Disp: 270 tablet, Rfl: 3   baclofen (LIORESAL) 10 MG tablet, One tablet in the morning and evening and 1.5 tablets at midday, Disp: 315 tablet, Rfl: 3   cetirizine (ZYRTEC) 10 MG tablet, Take 10 mg by mouth daily., Disp: , Rfl:    Cholecalciferol (VITAMIN D) 2000 UNITS CAPS, Take 2,000 Units by mouth daily. , Disp: , Rfl:    DULoxetine (CYMBALTA) 30 MG capsule, Take 90 mg by mouth daily. , Disp: , Rfl:    gabapentin (NEURONTIN) 300 MG capsule, TAKE 1 CAPSULE BY MOUTH THREE TIMES A DAY, Disp: 270 capsule, Rfl: 3   Glatiramer Acetate (COPAXONE) 40 MG/ML SOSY, Inject 40 mg into the skin 3 (three) times a week., Disp: 12 mL, Rfl: 11   insulin lispro (HUMALOG) 100 UNIT/ML KwikPen, Inject into the skin as needed., Disp: , Rfl:    losartan (COZAAR) 100 MG tablet, Take 100 mg by mouth daily., Disp: , Rfl:    metFORMIN (GLUCOPHAGE) 500 MG tablet, Take 500 mg by mouth 2 (two) times daily with a meal. , Disp: , Rfl:    ONETOUCH ULTRA test  strip, 2 (two) times daily as needed., Disp: , Rfl:    polyethylene glycol powder (GLYCOLAX/MIRALAX) 17 GM/SCOOP powder, Take 17 g by mouth daily as needed for mild constipation., Disp: , Rfl:    pravastatin (PRAVACHOL) 40 MG tablet, Take 40 mg by mouth daily. , Disp: , Rfl:    traMADol (ULTRAM) 50 MG tablet, TAKE 1 TABLET(50 MG) BY MOUTH THREE TIMES DAILY AS NEEDED, Disp: 270 tablet, Rfl: 1   TRULICITY 2.59 DG/3.8VF SOPN, Inject 0.75 mg into the skin once a week., Disp: , Rfl:   PAST MEDICAL HISTORY: Past Medical History:  Diagnosis Date   Abnormality of gait 02/16/2013   Acute bilateral ankle pain 11/15/2019   Allergic rhinitis  Allergy    Anxiety    Autoimmune hepatitis ([place])    Autoimmune hepatitis ([place])    Colon polyps    Diverticulosis    DM (diabetes mellitus) ([place])    Elevated LFTs    Hemorrhoids    Hemorrhoids    Hepatitis    with cholestasis   Hyperlipidemia    Hypertension    Migraines    Multiple sclerosis (HCC)    Obesity    Vitamin D deficiency     PAST SURGICAL HISTORY: Past Surgical History:  Procedure Laterality Date   ABDOMINAL HYSTERECTOMY     CESAREAN SECTION     x 2   MYOECTOMY     TONSILLECTOMY AND ADENOIDECTOMY      FAMILY HISTORY: Family History  Problem Relation Age of Onset   Diabetes Mother    Multiple sclerosis Mother    Cirrhosis Father    Heart disease Maternal Grandmother    Colon cancer Maternal Aunt    Uterine cancer Maternal Aunt    Brain cancer Maternal Uncle    Colon cancer Maternal Uncle    Breast cancer Neg Hx     SOCIAL HISTORY: Social History   Socioeconomic History   Marital status: Married    Spouse name: Shanon Brow   Number of children: 2   Years of education: HS   Highest education level: Not on file  Occupational History   Occupation: Unemployed  Tobacco Use   Smoking status: Former    Packs/day: 0.25    Years: 4.00    Total pack years: 1.00    Types: Cigarettes    Quit date: 10/01/1995    Years since  quitting: 26.7   Smokeless tobacco: Never  Vaping Use   Vaping Use: Never used  Substance and Sexual Activity   Alcohol use: No    Alcohol/week: 0.0 standard drinks of alcohol   Drug use: No   Sexual activity: Not on file  Other Topics Concern   Not on file  Social History Narrative   Lives at home w/ her husband   Right-handed   Drinks 1 cup of coffee per day   Social Determinants of Health   Financial Resource Strain: Not on file  Food Insecurity: Not on file  Transportation Needs: Not on file  Physical Activity: Not on file  Stress: Not on file  Social Connections: Not on file  Intimate Partner Violence: Not on file       PHYSICAL EXAM  Vitals:   06/25/22 1448  BP: (!) 143/100  Pulse: (!) 105  Height: 5' 3.75" (1.619 m)    Body mass index is 35.46 kg/m.   General: The patient is well-developed and well-nourished and in no acute distress.  She is in a wheelchair  HEENT:  Head is [place]/AT.  Sclera are anicteric.     Neck: No carotid bruits are noted.  The neck is nontender.  Cardiovascular: The heart has a regular rate and rhythm with a normal S1 and S2. There were no murmurs, gallops or rubs.    Skin: Extremities are without rash or  edema.  Musculoskeletal:  Back is nontender  Neurologic Exam  Mental status: The patient is alert and oriented x 3 at the time of the examination. The patient has apparent normal recent and remote memory, with an apparently normal attention span and concentration ability.   Speech is normal.  Cranial nerves: Extraocular movements are full. Pupils are equal, round, and reactive to light and accomodation.  Color  vision was symmetric.  Facial symmetry is present. There is good facial sensation to soft touch bilaterally.Facial strength is normal.  Trapezius and sternocleidomastoid strength is normal. No dysarthria is noted.  The tongue is midline, and the patient has symmetric elevation of the soft palate. No obvious hearing deficits  are noted.  Motor:  Muscle bulk is normal.   Tone is increased in the legs.  Strength is 4++/5 in the left arm and 5/5 in the right arm.  Strength is 1/5 in the right leg and 0/5 in the left leg.  Sensory: Sensory testing is intact to pinprick, soft touch and vibration sensation in all 4 extremities.  Coordination: Cerebellar testing reveals good finger-nose-finger.  Cannot test legs.  Gait and station: She is unable to stand.  Reflexes: Deep tendon reflexes are symmetric and normal bilaterally.   Plantar responses are flexor.    DIAGNOSTIC DATA (LABS, IMAGING, TESTING) - I reviewed patient records, labs, notes, testing and imaging myself where available.  Lab Results  Component Value Date   WBC 5.4 03/21/2022   HGB 14.7 03/21/2022   HCT 43.7 03/21/2022   MCV 93.6 03/21/2022   PLT 271.0 03/21/2022      Component Value Date/Time   NA 138 03/21/2022 1436   NA 138 12/18/2021 1457   NA 142 09/16/2014 1420   K 4.0 03/21/2022 1436   K 3.9 09/16/2014 1420   CL 101 03/21/2022 1436   CL 105 09/16/2014 1420   CO2 29 03/21/2022 1436   CO2 29 09/16/2014 1420   GLUCOSE 157 (H) 03/21/2022 1436   GLUCOSE 111 (H) 09/16/2014 1420   BUN 14 03/21/2022 1436   BUN 15 12/18/2021 1457   BUN 21 (H) 09/16/2014 1420   CREATININE 0.50 03/21/2022 1436   CREATININE 0.86 09/16/2014 1420   CALCIUM 9.8 03/21/2022 1436   CALCIUM 9.2 09/16/2014 1420   PROT 7.6 06/25/2022 1609   PROT 7.0 12/18/2021 1457   PROT 7.7 09/16/2014 1420   ALBUMIN 4.3 06/25/2022 1609   ALBUMIN 4.6 12/18/2021 1457   ALBUMIN 4.0 09/16/2014 1420   AST 40 (H) 06/25/2022 1609   AST 41 (H) 09/16/2014 1420   ALT 27 06/25/2022 1609   ALT 42 09/16/2014 1420   ALKPHOS 62 06/25/2022 1609   ALKPHOS 69 09/16/2014 1420   BILITOT 0.7 06/25/2022 1609   BILITOT 0.7 12/18/2021 1457   BILITOT 0.6 09/16/2014 1420   GFRNONAA >60 09/29/2019 0333   GFRNONAA >60 09/16/2014 1420   GFRAA >60 09/29/2019 0333   GFRAA >60 09/16/2014 1420    Lab Results  Component Value Date   CHOL 200 09/24/2019   HDL 52 09/24/2019   LDLCALC 125 (H) 09/24/2019   TRIG 114 09/24/2019   CHOLHDL 3.8 09/24/2019   Lab Results  Component Value Date   HGBA1C 6.7 (H) 09/24/2019   No results found for: "VITAMINB12" Lab Results  Component Value Date   TSH 0.266 (L) 08/22/2015       ASSESSMENT AND PLAN  MS (multiple sclerosis) (HCC)  Cerebral infarction due to embolism of right middle cerebral artery (HCC)  Wheelchair dependence  Urinary incontinence, unspecified type  In summary, Ms. Teall is a 66 year old woman with MS diagnosed in 1998 who is currently on Copaxone.  We had a discussion about disease modifying therapies.  Although with a are usually beneficial for the inflammatory aspects of MS that causes new lesions and exacerbations, they do not help much for the more progressive aspects.  Therefore, it is  unclear to me how much benefit she gets from the Copaxone.  We discussed that we might want to consider stopping the medication at some point in the future.  She reports that she has an appointment to see physical therapy for an electric wheelchair evaluation.  She will continue gabapentin, tramadol and duloxetine for pain and mood.  She does not need a refill at this time.  Continue baclofen for spasticity.  She had an embolic stroke, possibly due to a PFO and DVT.  She will remain on Eliquis.  She will return in 6 months or sooner if there are new or worsening neurologic symptoms.  45-minute office visit with the majority of the time spent face-to-face for history and physical, discussion/counseling and decision-making.  Additional time with record review and documentation.     Chianne Byrns A. Felecia Shelling, MD, [facility] 1/50/5697, 9:48 PM Certified in Neurology, Clinical Neurophysiology, Sleep Medicine and Neuroimaging  [facility] [address] [place], [place] [address] [phone]

## 2022-06-26 ENCOUNTER — Other Ambulatory Visit: Payer: Self-pay

## 2022-06-26 DIAGNOSIS — K754 Autoimmune hepatitis: Secondary | ICD-10-CM

## 2022-06-28 ENCOUNTER — Encounter: Payer: Self-pay | Admitting: Neurology

## 2022-07-05 DIAGNOSIS — Z6835 Body mass index (BMI) 35.0-35.9, adult: Secondary | ICD-10-CM | POA: Diagnosis not present

## 2022-07-05 DIAGNOSIS — E1169 Type 2 diabetes mellitus with other specified complication: Secondary | ICD-10-CM | POA: Diagnosis not present

## 2022-07-05 DIAGNOSIS — E1159 Type 2 diabetes mellitus with other circulatory complications: Secondary | ICD-10-CM | POA: Diagnosis not present

## 2022-07-05 DIAGNOSIS — Z515 Encounter for palliative care: Secondary | ICD-10-CM | POA: Diagnosis not present

## 2022-07-05 DIAGNOSIS — E785 Hyperlipidemia, unspecified: Secondary | ICD-10-CM | POA: Diagnosis not present

## 2022-07-05 DIAGNOSIS — G35 Multiple sclerosis: Secondary | ICD-10-CM | POA: Diagnosis not present

## 2022-07-05 DIAGNOSIS — I10 Essential (primary) hypertension: Secondary | ICD-10-CM | POA: Diagnosis not present

## 2022-07-05 DIAGNOSIS — I69334 Monoplegia of upper limb following cerebral infarction affecting left non-dominant side: Secondary | ICD-10-CM | POA: Diagnosis not present

## 2022-07-30 DIAGNOSIS — I69354 Hemiplegia and hemiparesis following cerebral infarction affecting left non-dominant side: Secondary | ICD-10-CM | POA: Diagnosis not present

## 2022-07-30 DIAGNOSIS — I639 Cerebral infarction, unspecified: Secondary | ICD-10-CM | POA: Diagnosis not present

## 2022-07-30 DIAGNOSIS — G35 Multiple sclerosis: Secondary | ICD-10-CM | POA: Diagnosis not present

## 2022-08-08 ENCOUNTER — Ambulatory Visit (HOSPITAL_COMMUNITY): Payer: PPO | Attending: Family Medicine | Admitting: Occupational Therapy

## 2022-08-08 DIAGNOSIS — R29898 Other symptoms and signs involving the musculoskeletal system: Secondary | ICD-10-CM | POA: Insufficient documentation

## 2022-08-08 DIAGNOSIS — M6281 Muscle weakness (generalized): Secondary | ICD-10-CM | POA: Insufficient documentation

## 2022-08-09 ENCOUNTER — Encounter (HOSPITAL_COMMUNITY): Payer: Self-pay | Admitting: Occupational Therapy

## 2022-08-09 NOTE — Therapy (Signed)
OUTPATIENT OCCUPATIONAL THERAPY ORTHO EVALUATION  Patient Name: Gabriela Rice MRN: [id number] DOB:1956-02-07, 66 y.o., female Today's Date: 08/09/2022  PCP: Jonathon Jordan, MD REFERRING PROVIDER: Jonathon Jordan, MD   OT End of Session - 08/09/22 1009     Visit Number 1    Number of Visits 1    Date for OT Re-Evaluation 08/08/22    OT Start Time 52    OT Stop Time 0263    OT Time Calculation (min) 75 min    Activity Tolerance Patient tolerated treatment well    Behavior During Therapy [facility] for tasks assessed/performed             Past Medical History:  Diagnosis Date   Abnormality of gait 02/16/2013   Acute bilateral ankle pain 11/15/2019   Allergic rhinitis    Allergy    Anxiety    Autoimmune hepatitis ([place])    Autoimmune hepatitis ([place])    Colon polyps    Diverticulosis    DM (diabetes mellitus) ([place])    Elevated LFTs    Hemorrhoids    Hemorrhoids    Hepatitis    with cholestasis   Hyperlipidemia    Hypertension    Migraines    Multiple sclerosis ([place])    Obesity    Vitamin D deficiency    Past Surgical History:  Procedure Laterality Date   ABDOMINAL HYSTERECTOMY     CESAREAN SECTION     x 2   MYOECTOMY     TONSILLECTOMY AND ADENOIDECTOMY     Patient Active Problem List   Diagnosis Date Noted   Wheelchair dependence 06/25/2022   Urinary incontinence 06/25/2022   Anxiety 12/18/2021   Bacterial infection 12/18/2021   Cerebral infarction ([place]) 12/18/2021   Decreased estrogen level 12/18/2021   Hemiplegia of nondominant side as late effect of cerebrovascular disease ([place]) 12/18/2021   Hyperglycemia due to type 2 diabetes mellitus ([place]) 12/18/2021   Leukopenia 12/18/2021   Mild recurrent major depression ([place]) 12/18/2021   Multiple drug resistant organism (MDRO) culture positive 12/18/2021   Personal history of transient ischemic attack (TIA), and cerebral infarction without residual deficits 12/18/2021   Postmenopausal bleeding 12/18/2021    Seborrheic dermatitis of scalp 12/18/2021   Senile purpura ([place]) 12/18/2021   Subacute vulvitis 12/18/2021   Vitamin D deficiency 12/18/2021   Acute bilateral ankle pain 11/15/2019   Hypokalemia    Acute deep vein thrombosis (DVT) of tibial vein of both lower extremities ([place])    Acute urinary retention    Stroke (cerebrum) ([place]) 09/23/2019   Stroke ([place]) 09/23/2019   Acute CVA (cerebrovascular accident) ([place]) s/p tPA, embolic, unknown source 78/58/8502   Ankle fracture, right 05/15/2019   Benign paroxysmal positional vertigo 09/06/2015   Constipation    Weakness of both legs    Essential hypertension    Type 2 diabetes mellitus with diabetic neuropathy (HCC)    Morbid obesity (HCC)    Generalized anxiety disorder    Lumbar spondylosis with myelopathy    Dehydration    Acute lower UTI    Multiple sclerosis exacerbation ([place]) 08/20/2015   Abdominal pain 08/20/2015   Autoimmune hepatitis ([place]) 08/20/2015   Diabetes mellitus type 2, controlled ([place]) 08/20/2015   Hypertension 08/20/2015   Right-sided Lumbar Facet Syndrome 07/13/2015   Lumbar spondylosis 07/13/2015   Chronic low back pain 07/13/2015   Trigger point with back pain 07/13/2015   Myofascial pain syndrome 07/13/2015   Chronic pain 07/13/2015   Chronic pain syndrome 07/13/2015   Generalized  weakness 07/13/2015   Right-sided chronic neck pain 07/13/2015   Cervical spondylosis 07/13/2015   DDD (degenerative disc disease), cervical 07/13/2015   Cervical facet syndrome 07/13/2015   DDD (degenerative disc disease), lumbar 07/13/2015   Pain of right lower extremity 07/13/2015   Chronic radicular lumbar pain 07/13/2015   Non-insulin dependent type 2 diabetes mellitus ([place]) 07/13/2015   History of hysterectomy 07/13/2015   History of C-section 07/13/2015   DM (diabetes mellitus) ([place]) 07/13/2015   Hyperlipidemia 07/13/2015   Obesity 07/13/2015   Diverticulosis 07/13/2015   Lumbago 02/02/2014   Difficulty in  walking(719.7) 02/02/2014   HBP (high blood pressure) 07/11/2013   Chronic rhinitis 04/08/2013   Abnormality of gait 02/16/2013   Elevated liver function tests 02/12/2013   Abdominal pain, right upper quadrant 02/12/2013   Nausea alone 02/12/2013   Dyspnea 02/04/2013   Hypoxemia, nocturnal only  02/04/2013   Encounter for long-term (current) use of other medications 10/14/2012   MS (multiple sclerosis) ([place]) 10/14/2012   IRRITABLE BOWEL SYNDROME 03/29/2009   FATTY LIVER DISEASE 03/29/2009   Nonspecific elevation of levels of transaminase or lactic acid dehydrogenase (LDH) 03/29/2009   PERSONAL HX COLONIC POLYPS 03/29/2009    REFERRING DIAG: WC  Evaluation  THERAPY DIAG:  Other symptoms and signs involving the musculoskeletal system  Muscle weakness (generalized)   Mobility/Seating Evaluation    PATIENT INFORMATION: Name: Gabriela Rice DOB: [birth year]  Sex: F Date seen: 08/08/22 Time: 2pm  Address:  [address] [place], [place] [address] Physician: Jonathon Jordan, MD This evaluation/justification form will serve as the LMN for the following suppliers: __________________________ Supplier: [employer] Contact Person: Felton Clinton Phone:  [phone]   Seating Therapist: Felton Clinton Phone:   [phone]   Phone: [phone]    Spouse/Parent/Caregiver name: Blinda Leatherwood  Phone number: [phone] Insurance/Payer: [employer]     Reason for Referral: For a Power Wheelchair  Patient/Caregiver Goals: For better posturing in a chair and for more mobility independence  Patient was seen for face-to-face evaluation for new power wheelchair.  Also present was Felton Clinton to discuss recommendations and wheelchair options.  Further paperwork was completed and sent to vendor.  Patient appears to qualify for power mobility device at this time per objective findings.   MEDICAL HISTORY: Diagnosis: Primary Diagnosis: MS Onset: 1998 Diagnosis:    _0 Progressive Disease  Relevant past and future surgeries:    Height: 5'4" Weight: 204lbs Explain recent changes or trends in weight:    History including Falls: No recent falls    HOME ENVIRONMENT: _1 House  _2 Condo/town home  _3 Apartment  _4 Assisted Living    _5 Lives Alone _6  Lives with Others                                                                                          Hours with caregiver: 24/7  _7 Home is accessible to patient           Stairs      _8 Yes _9  No     Ramp _10 Yes _11 No Comments:  Ramp is to the front door   COMMUNITY ADL: TRANSPORTATION:  [employer]    _12 Lucianne Lei    [  x]Public Transportation    _0 [employer] w/c Lift    _1 Ambulance    _2 Other:       _3 Sits in wheelchair during transport  Employment/School:  Specific requirements pertaining to mobility   Other: [place] transportation, community Sales executive PROCESSING SKILLS:  Handedness:   _4 Right     _5 Left    _6 NA  Comments:    Functional Processing Skills for Wheeled Mobility _7 Processing Skills are adequate for safe wheelchair operation  Areas of concern than may interfere with safe operation of wheelchair Description of problem   _8  Attention to environment      _9 Judgment      _10  Hearing  _11  Vision or visual processing      _12 Motor Planning  _13  Fluctuations in Behavior  Pt has vision changes with MS and age related, wears glasses    VERBAL COMMUNICATION: _14 WFL receptive _15  WFL expressive _16 Understandable  _17 Difficult to understand  _18 non-communicative _19  Uses an augmented communication device  CURRENT SEATING / MOBILITY: Current Mobility Base:  _20 None _21 Dependent _22 Manual _23 Scooter _24 Power  Type of Control:   Manufacturer:  DriveSize:  41SE Age: 50 years  Current Condition of Mobility Base:  Good   Current Wheelchair components:  Harness for upperbody and feet, back and bottom pressure cushions, arm rests, foot pedals  Describe posture in present seating system:  kept in position with harness, slightly slumped      SENSATION and SKIN ISSUES: Sensation _25 Intact  _26 Impaired _27 Absent  Level of sensation: Diminished all the way down Pressure Relief: Able to perform effective pressure relief :    _28 Yes  _29  No Method:  If not, Why?: No LE strength to assist, UE too weak to perform a sufficient WC push up.  Skin Issues/Skin Integrity Current Skin Issues  _30 Yes _31 No _32 Intact _33  Red area_34  Open Area  _35 Scar Tissue _36 At risk from prolonged sitting Where  Lateral aspect of R calf  History of Skin Issues  _37 Yes _38 No Where   When    Hx of skin flap surgeries  _39 Yes _40 No Where   When    Limited sitting tolerance _41 Yes _42 No Hours spent sitting in wheelchair daily: Pt does not get into wheel chair daily due to pain and poor positioning, she does sit in her lift chair for up to 5 hours at a time.  Complaint of Pain:  Please describe: Pt reports increasing pain with increased time in the [facility], the worst pain is in the ,back, feet and shoulders. She describes the pain as ache and stabbing   Swelling/Edema: No swelling noted this session, family reports that she does get swelling in her feet and legs with increased time sitting.    ADL STATUS (in reference to wheelchair use):  Indep Assist Unable Indep with Equip Not assessed Comments  Dressing _43  _44  _45  _46  _47  Pt requires total assist for LB, mod assist for UB, dresses in bed  Eating _48  _49  _50  _51  _52    Toileting _53  _54  _55  _56  _57  PT requires total assist for all toileting tasks, uses depends 24/7  Bathing _58  _59  _60  _61  _62  Pt requires total assist for LB, mod assist for UB, bathes in bed  Grooming/Hygiene _63  _64  _65  _66  _67  Husband sets up items pt needs and she completes tasks from there  Meal Prep _68  _69  _70  _71  _72  Husband does all meal prep  IADLS <LTRVUYEBXIDHWYSH>_6<\/OHFGBMSXJDBZMCEY>_22  _74  _75  _76  _77  Pt does med management and finanace, husband does most other IADL's.  Bowel Management: _78 Continent  _79 Incontinent  _80   Accidents Comments:  Wears depends  Bladder Management: _0 Continent   _1 Incontinent  _2 Accidents Comments:  Wears depends     WHEELCHAIR SKILLS: Manual w/c Propulsion: _3 UE or LE strength and endurance sufficient to participate in ADLs using manual wheelchair Arm : _4 left _5 right   _6 Both      Distance: 0 Foot:  _7 left _8 right   _9 Both  Operate Scooter: _10  Strength, hand grip, balance and transfer appropriate for use _11 Living environment is accessible for use of scooter  Operate Power w/c:  _12  Std. Joystick   _13  Alternative Controls Indep _14  Assist _15  Dependent/unable _16  N/A _17   _18 Safe          _19  Functional      Distance:   Bed confined without wheelchair _20  Yes _21  No   STRENGTH/RANGE OF MOTION:  A/ROM Range of Motion Strength  Shoulder flexion, abduction, er/IR, WFL RUE- flexion 4/5, extension 4-/5, abduction 4/5, adduction 4+/5, er 4+/5, IR 5/5 LUE- flexion 4-/5, extension 4-/5, abduction 4-/5 , adduction 4-/5, er 4/5, IR 4/5  Elbow flexion, extension, WFL RUE: flexion 4/5, extension 3+/5 LUE: flexion 4/5, extension 3+/5   Wrist/Hand flexion, extension, ulnar deviation, radial deviation, supination and pronation, WFL RUE: flexion 4+/5, extension 4+/5, ulnar dev 4+/5, radial dev 4+/5, supination 5/5, pronation 5/5 LUE: flexion 4+/5, extension 4+/5, ulnar dev 4+/5, radial dev 4+/5, supination 5/5, pronation 5/  Hip Unable to test, no active movement, needs hoyer to get out of WC 0/5 - no active movement  Knee UnCable to test, no active movement, needs hoyer to get out of WC 0/5 - no active movement  Ankle Unable to test, no active movement, needs hoyer to get out of WC  0/5 - no active movement     MOBILITY/BALANCE:  _22  Patient is totally dependent for mobility  Using a hoyer lift for all transfers    Balance Transfers Ambulation  Sitting Balance: Standing Balance: _23  Independent _24  Independent/Modified Independent  _25  WFL     _26  WFL _27  Supervision _28  Supervision  _29  Uses UE for balance  _30  Supervision _31  Min Assist _32  Ambulates with Assist       _33  Min Assist _34  Min assist _35  Mod Assist _36  Ambulates with Device:      _37  RW  _38  StW  _39  Cane  _40    _41  Mod Assist _42  Mod assist _43  Max assist   _44  Max Assist _45  Max assist _46  Dependent _47  Indep. Short Distance Only  _48  Unable _49  Unable _50  Lift / Sling Required Distance (in feet)     _51  Sliding board _52  Unable to Ambulate (see explanation below)  Cardio Status:  _53 Intact  _54  Impaired   _55  NA       Respiratory Status:  _56 Intact   _57 Impaired   _58 NA       Orthotics/Prosthetics: None  Comments (Address manual vs power w/c vs scooter): Pt needs a power WC due to positioning needs, as well as decreasing strength  in her UE from the progressive disease of MS. She is unable to self propel her manual WC at this time more than 1 or 2 propulsions before fatiguing.          Anterior / Posterior Obliquity Rotation-Pelvis   PELVIS    _59  _60  _61   Neutral Posterior Anterior  _62  _63  _64   WFL Rt elev Lt elev  _65  _66  _67   [facility] Right Left  Anterior    Anterior     _0  Fixed _1  Other _2  Partly Flexible _3  Flexible   _4  Fixed _5  Other _6  Partly Flexible  _7  Flexible  _8  Fixed _9  Other _10  Partly Flexible  _11  Flexible   TRUNK  _12  _13  _14   [facility]  Thoracic  Lumbar  Kyphosis Lordosis  _15  _16  _17   WFL Convex Convex  Right Left _18 c-curve _19 s-curve _20 multiple  _21  Neutral _22  Left-anterior _23  Right-anterior     _24  Fixed _25  Flexible _26  Partly Flexible _27  Other  _28  Fixed _29  Flexible _30  Partly Flexible _31  Other  _32  Fixed             _33  Flexible _34  Partly Flexible _35  Other    Position Windswept    HIPS          _36            _37               _38    Neutral       Abduct        ADduct         _39           _40            _41   Neutral Right           Left      _42  Fixed _43  Subluxed _44  Partly Flexible _45  Dislocated _46  Flexible  _47  Fixed _48  Other _49  Partly Flexible  _50  Flexible                 Foot Positioning Knee Positioning      _51  WFL  _52 Lt _53 Rt _54  WFL  _55 Lt _56 Rt     KNEES ROM concerns: ROM concerns:    & Dorsi-Flexed _57 Lt _58 Rt Pt has limited knee flexion, unable to bend her knees in order to keep her feet on the foot plate, requires harnessing to keep her feet in place    FEET Plantar Flexed _59 Lt _60 Rt      Inversion                 _61 Lt _62 Rt      Eversion                 _63 Lt _64 Rt     HEAD _65  Functional _66  Good Head Control    & _67  Flexed         _68  Extended _69  Adequate Head Control    NECK _70  Rotated  Lt  _71  Lat Flexed Lt _72  Rotated  Rt _73  Lat Flexed Rt _74  Limited Head Control     _75  Cervical Hyperextension _76  Absent  Head Control     SHOULDERS ELBOWS WRIST& HAND       Left     Right    Left     Right    Left     Right   U/E _77 Functional           _78 Functional WFL WFL _79 Fisting             _80 Fisting      _81 elev   _82 dep      _83 elev   _84 dep       _85 pro -_86 retract     _87 pro  _88 retract _89 subluxed             _90 subluxed           Goals for Wheelchair Mobility  _91  Independence with mobility in the home with motor related  ADLs (MRADLs)  _0  Independence with MRADLs in the community _1  Provide dependent mobility  _2  Provide recline     _3 Provide tilt   Goals for Seating system _4  Optimize pressure distribution _5  Provide support needed to facilitate function or safety _6  Provide corrective forces to assist with maintaining or improving posture _7  Accommodate client's posture:   current seated postures and positions are not flexible or will not tolerate corrective forces _8  Client to be independent with relieving pressure in the wheelchair _9 Enhance physiological function such as breathing, swallowing, digestion  Simulation ideas/Equipment trials:[employer] why other equipment was unsuccessful:does not hold her in correct posture as she fatigues   MOBILITY BASE RECOMMENDATIONS and JUSTIFICATION: MOBILITY COMPONENT JUSTIFICATION  Manufacturer: Quantum Model: Edge 3   Size: Width 19Seat Depth 18 _10 provide transport from point  A to B      _11 promote Indep mobility  _12 is not a safe, functional ambulator _13 walker or cane inadequate _14 non-standard width/depth necessary to accommodate anatomical measurement _15    _16 Manual Mobility Base _17 non-functional ambulator    _18 Scooter/POV  _19 can safely operate  _20 can safely transfer   _21 has adequate trunk stability  _22 cannot functionally propel manual w/c  _23 Power Mobility Base  _24 non-ambulatory  _25 cannot functionally propel manual wheelchair  _26  cannot functionally and safely operate scooter/POV _27 can safely operate and willing to  _28 Stroller Base _29 infant/child  _30 unable to propel manual wheelchair _31 allows for growth _32 non-functional ambulator _33 non-functional UE _34 Indep mobility is not a goal at this time  _35 Tilt  _36 Forward _37 Backward _38 Powered tilt  _39 Manual tilt  _40 change position against gravitational force on head and shoulders  _41 change position for pressure relief/cannot weight shift _42 transfers  _43 management of tone _44 rest periods _45 control edema _46 facilitate postural control  _47    _48 Recline  _49 Power recline on power base _50 Manual recline on manual base  _51 accommodate femur to back angle  _52 bring to full recline for ADL care  _53 change position for pressure relief/cannot weight shift _54 rest periods _55 repositioning for transfers or clothing/diaper /catheter changes _56 head positioning  _57 Lighter weight required _58 self- propulsion  _59 lifting _60    _61 Heavy Duty required _62 user weight greater than 250# _63 extreme tone/ over active movement _64 broken frame on previous chair _65    _66  Back  _67  Angle Adjustable _68  Custom molded True Comfort _69 postural control _70 control of tone/spasticity _71 accommodation of range of motion _72 UE functional control _73 accommodation for seating system _74   _75 provide lateral trunk support _76 accommodate deformity _77 provide posterior trunk support _78 provide lumbar/sacral support _79 support trunk in  midline _80 Pressure relief over spinal processes  _81  Seat Cushion Solution _82 impaired sensation  _83 decubitus ulcers present _84 history of pressure ulceration _85 prevent pelvic extension _86 low maintenance  _87 stabilize pelvis  _88 accommodate obliquity _89 accommodate multiple deformity _90 neutralize lower extremity position _91 increase pressure distribution _92    _93  Pelvic/thigh support  _94  Lateral thigh guide _95  Distal medial pad  _96  Distal lateral pad _97  pelvis in neutral _98 accommodate pelvis _99  position upper legs _100  alignment _101  accommodate ROM _102  decr adduction _103 accommodate tone _104 removable for transfers _105 decr abduction  _106  Lateral trunk Supports _107  Lt     _108  Rt _109 decrease lateral trunk leaning _110 control tone _111 contour for increased contact _112 safety  _113 accommodate asymmetry _114    _115  Mounting hardware  _116 lateral trunk supports  _117 back   _118 seat _119 headrest      _120  thigh support _121 fixed   _122 swing away _123 attach seat platform/cushion to w/c frame _124 attach back cushion to w/c frame _125 mount postural supports _126 mount headrest  _127 swing medial thigh support away _128 swing lateral supports away for transfers  _129     Armrests  _130   fixed _0 adjustable height _1 removable   _2 swing away  _3 flip back   _4 reclining _5 full length pads _6 desk    _7 pads tubular  _8 provide support with elbow at 90   _9 provide support for w/c tray _10 change of height/angles for variable activities _11 remove for transfers _12 allow to come closer to table top _13 remove for access to tables _14    Hangers/ Leg rests  _15 60 _16 70 _17 90 _18 elevating _19 heavy duty  _20 articulating _21 fixed _22 lift off _23 swing away     _24 power _25 provide LE support  _26 accommodate to hamstring tightness _27 elevate legs during recline   _28 provide change in position for Legs _29 Maintain placement of feet on footplate _30 durability _31 enable transfers _32 decrease edema _33 Accommodate lower leg length _34    Foot  support Footplate    <ZRAQTMAUQJFHLKTG>_2<\/BWLSLHTDSKAJGOTL>_57 Lt  _36  Rt  _37  Center mount _38 flip up     _39 depth/angle adjustable _40 Amputee adapter    _41  Lt     _42  Rt _43 provide foot support _44 accommodate to ankle ROM _45 transfers _46 Provide support for residual extremity _47  allow foot to go under wheelchair base _48  decrease tone  _49    _50  Ankle strap/heel loops _51 support foot on foot support _52 decrease extraneous movement _53 provide input to heel  _54 protect foot  Tires: _55 pneumatic  _56 flat free inserts  _57 solid  _58 decrease maintenance  _59 prevent frequent flats _60 increase shock absorbency _61 decrease pain from road shock _62 decrease spasms from road shock _63    _64  Headrest  _65 provide posterior head support _66 provide posterior neck support _67 provide lateral head support _68 provide anterior head support _69 support during tilt and recline _70 improve feeding   _71 improve respiration _72 placement of switches _73 safety  _74 accommodate ROM  _75 accommodate tone _76 improve visual orientation  _77  Anterior chest strap _78  Vest _79  Shoulder retractors  _80 decrease forward movement of shoulder _81 accommodation of TLSO _82 decrease forward movement of trunk _83 decrease shoulder elevation _84 added abdominal support _85 alignment _86 assistance with shoulder control  _87    Pelvic Positioner _88 Belt _89 SubASIS bar _90 Dual Pull _91 stabilize tone _92 decrease falling out of chair/ **will not Decr potential for sliding due to pelvic tilting _93 prevent excessive rotation _94 pad for protection over boney prominence _95 prominence comfort _96 special pull angle to control rotation _97    Upper Extremity Support _98 L   _99  R _100 Arm trough    _101 hand support _102  tray       _103 full tray _104 swivel mount _105 decrease edema      _106 decrease subluxation   _107 control tone   _108 placement for AAC/Computer/EADL _109 decrease gravitational pull on shoulders _110 provide midline positioning _111 provide support to increase UE function _112 provide hand support in natural  position _113 provide work surface   POWER WHEELCHAIR CONTROLS  _114 Proportional  _115 Non-Proportional Type Joystick _116 Left  _117 Right _118 provides access for controlling wheelchair   _119 lacks motor control to operate proportional drive control <WIOMBTDHRCBULAGT>_3<\/MIWOEHOZYYQMGNOI>_370 unable to understand proportional controls  Actuator Control Module  _121 Single  _122 Multiple   _123 Allow the client to operate the power seat function(s) through the joystick control   _124 Safety Reset Switches _125 Used to change modes and stop the wheelchair when driving in latch mode    _126 [employer]   _127 programming for accurate control _128 progressive Disease/changing condition _129 non-proportional drive control needed _130 Needed in order to operate power seat functions through joystick control   _131 Display box _132 Allows user to see in which mode and drive the wheelchair is set  _133 necessary for alternate controls    _134 Digital interface electronics _135 Allows w/c to operate when using alternative drive controls  <WUGQBVQXIHWTUUEK>_8<\/MKLKJZPHXTAVWPVX>_480 ASL Head Array _137 Allows client to operate wheelchair  through switches placed in tri-panel headrest  _138 Sip and puff with tubing kit _139 needed to operate sip and  puff drive controls  <TOIZTIWPYKDXIPJA>_2<\/NKNLZJQBHALPFXTK>_2 Upgraded tracking electronics _1 increase safety when driving <IOXBDZHGDJMEQAST>_4<\/HDQQIWLNLGXQJJHE>_1 correct tracking when on uneven surfaces  _3 Mount for switches or joystick _4 Attaches switches to w/c  _5 Swing away for access or transfers _6 midline for optimal placement _7 provides for consistent access  _8 Attendant controlled joystick plus mount _9 safety _10 long distance driving <DEYCXKGYJEHUDJSH>_7<\/WYOVZCHYIFOYDXAJ>_28 operation of seat functions _12 compliance with transportation regulations _13     Rear wheel placement/Axle adjustability _14 None _15 semi adjustable _16 fully adjustable  _17 improved UE access to wheels _18 improved stability _19 changing angle in space for improvement of postural stability _20 1-arm drive access <NOMVEHMCNOBSJGGE>_3<\/[id number]>_56 amputee pad placement _22    Wheel rims/ hand rims  _23 metal  _24 plastic coated _25 oblique projections _26 vertical  projections _27 Provide ability to propel manual wheelchair  _28  Increase self-propulsion with hand weakness/decreased grasp  Push handles _29 extended  _30 angle adjustable  _31 standard _32 caregiver access _33 caregiver assist _34 allows "hooking" to enable increased ability to perform ADLs or maintain balance  One armed device  _35 Lt   _36 Rt _37 enable propulsion of manual wheelchair with one arm   _38     Brake/wheel lock extension _39  Lt   _40  Rt _41 increase indep in applying wheel locks   _42 Side guards _43 prevent clothing getting caught in wheel or becoming soiled _44  prevent skin tears/abrasions  Battery: NF22x2 _45 to power wheelchair   Other:     The above equipment has a life- long use expectancy. Growth and changes in medical and/or functional conditions would be the exceptions. This is to certify that the therapist has no financial relationship with durable medical provider or manufacturer. The therapist will not receive remuneration of any kind for the equipment recommended in this evaluation.   Patient has mobility limitation that significantly impairs safe, timely participation in one or more mobility related ADL's.  (bathing, toileting, feeding, dressing, grooming, moving from room to room)                                                             _46  Yes _47  No Will mobility device sufficiently improve ability to participate and/or be aided in participation of MRADL's?         _48  Yes _49  No Can limitation be compensated for with use of a cane or walker?                                                                                _50  Yes _51  No Does patient or caregiver demonstrate ability/potential ability & willingness to safely use the mobility device?   _52  Yes _53  No Does patient's home environment support use of recommended mobility device?                                                    _54  Yes _55  No Does patient have sufficient upper extremity function necessary to functionally propel a manual  wheelchair?    _56  Yes _57  No Does patient  have sufficient strength and trunk stability to safely operate a POV (scooter)?                                  _0  Yes _1  No Does patient need additional features/benefits provided by a power wheelchair for MRADL's in the home?       _2  Yes _3  No Does the patient demonstrate the ability to safely use a power wheelchair?                                                              _4  Yes _5  No  Therapist Name Printed: Paulita Fujita Date: 08/08/22  Therapist's Signature:   Date:   Supplier's Name Printed: [employer] Date:   Supplier's Signature:   Date:  Patient/Caregiver Signature:   Date:     This is to certify that I have read this evaluation and do agree with the content within:   83 Name Printed:   47 Signature:  Date:     This is to certify that I, the above signed therapist have the following affiliations: _6  This DME provider _7  Manufacturer of recommended equipment _8  Patient's long term care facility _9  None of the above     Paulita Fujita, OTR/L [facility] [place], [place] 08/09/2022, 10:10 AM

## 2022-08-13 ENCOUNTER — Encounter: Payer: Self-pay | Admitting: *Deleted

## 2022-08-13 ENCOUNTER — Telehealth: Payer: Self-pay | Admitting: *Deleted

## 2022-08-13 NOTE — Patient Instructions (Signed)
Visit Information  Thank you for taking time to visit with me today. Please don't hesitate to contact me if I can be of assistance to you.   Following are the goals we discussed today:   Goals Addressed               This Visit's Progress     COMPLETED: No needs (pt-stated)        Care Coordination Interventions: Provided education to patient and/or caregiver about advanced directives Reviewed medications with patient and discussed adherence with all medications with no needed refills Reviewed scheduled/upcoming provider appointments including pending appointments with sufficient transportation source Screening for signs and symptoms of depression related to chronic disease state  Assessed social determinant of health barriers          Please call the care guide team at [phone] if you need to cancel or reschedule your appointment.   If you are experiencing a Mental Health or [place] or need someone to talk to, please call the Suicide and Crisis Lifeline: 988  Patient verbalizes understanding of instructions and care plan provided today and agrees to view in [place]. Active MyChart status and patient understanding of how to access instructions and care plan via MyChart confirmed with patient.     No further follow up required: No needs    Raina Mina, RN Care Management Coordinator [place] Office [phone]

## 2022-08-13 NOTE — Patient Outreach (Signed)
  Care Coordination   Initial Visit Note   08/13/2022 Name: Gabriela Rice MRN: [id number] DOB: 27-Jun-1956  Gabriela Rice is a 66 y.o. year old female who sees Jonathon Jordan, MD for primary care. I spoke with  [name] by phone today.  What matters to the patients health and wellness today?  No needs    Goals Addressed               This Visit's Progress     COMPLETED: No needs (pt-stated)        Care Coordination Interventions: Provided education to patient and/or caregiver about advanced directives Reviewed medications with patient and discussed adherence with all medications with no needed refills Reviewed scheduled/upcoming provider appointments including pending appointments with sufficient transportation source Screening for signs and symptoms of depression related to chronic disease state  Assessed social determinant of health barriers          SDOH assessments and interventions completed:  Yes  SDOH Interventions Today    Flowsheet Row Most Recent Value  SDOH Interventions   Food Insecurity Interventions Intervention Not Indicated  Housing Interventions Intervention Not Indicated  Transportation Interventions Intervention Not Indicated  Utilities Interventions Intervention Not Indicated        Care Coordination Interventions Activated:  Yes  Care Coordination Interventions:  Yes, provided   Follow up plan: No further intervention required.   Encounter Outcome:  Pt. Visit Completed   Raina Mina, RN Care Management Coordinator [place] Office [phone]

## 2022-09-02 ENCOUNTER — Other Ambulatory Visit: Payer: Self-pay | Admitting: Neurology

## 2022-10-08 ENCOUNTER — Other Ambulatory Visit: Payer: Self-pay | Admitting: Neurology

## 2022-11-04 ENCOUNTER — Telehealth: Payer: Self-pay | Admitting: Neurology

## 2022-11-04 MED ORDER — APIXABAN 5 MG PO TABS: ORAL_TABLET | ORAL | 0 refills | Status: DC

## 2022-11-04 NOTE — Telephone Encounter (Signed)
Pt is requesting a refill for apixaban (ELIQUIS) 5 MG TABS tablet  .  Pharmacy: [facility] [phone]

## 2022-11-04 NOTE — Telephone Encounter (Signed)
Follow up scheduled on 12/26/22  Rx refilled.

## 2022-11-06 ENCOUNTER — Other Ambulatory Visit: Payer: Self-pay

## 2022-11-06 MED ORDER — APIXABAN 5 MG PO TABS: ORAL_TABLET | ORAL | 0 refills | Status: DC

## 2022-11-18 ENCOUNTER — Telehealth: Payer: Self-pay | Admitting: Internal Medicine

## 2022-11-18 NOTE — Telephone Encounter (Signed)
Pt states she has not had a BM in over a week. She is taking 1 dose of miralax daily. Reports she did 2 fleets liquid supp and did pass some liquid and some gas. Recommended pt try miralax purge. She is aware of the instructions and will try this, she knows to call back if this does not help.

## 2022-11-18 NOTE — Telephone Encounter (Signed)
Inbound call from patient stating that she is very constipated and has not had a bowel movement in a week and can't pass gas. Patient is requesting a call back to discuss. Please advise.

## 2022-11-22 DIAGNOSIS — Z794 Long term (current) use of insulin: Secondary | ICD-10-CM | POA: Diagnosis not present

## 2022-11-22 DIAGNOSIS — I69354 Hemiplegia and hemiparesis following cerebral infarction affecting left non-dominant side: Secondary | ICD-10-CM | POA: Diagnosis not present

## 2022-11-22 DIAGNOSIS — Z23 Encounter for immunization: Secondary | ICD-10-CM | POA: Diagnosis not present

## 2022-11-22 DIAGNOSIS — E559 Vitamin D deficiency, unspecified: Secondary | ICD-10-CM | POA: Diagnosis not present

## 2022-11-22 DIAGNOSIS — Z0000 Encounter for general adult medical examination without abnormal findings: Secondary | ICD-10-CM | POA: Diagnosis not present

## 2022-11-22 DIAGNOSIS — Z1159 Encounter for screening for other viral diseases: Secondary | ICD-10-CM | POA: Diagnosis not present

## 2022-11-22 DIAGNOSIS — K754 Autoimmune hepatitis: Secondary | ICD-10-CM | POA: Diagnosis not present

## 2022-11-22 DIAGNOSIS — F330 Major depressive disorder, recurrent, mild: Secondary | ICD-10-CM | POA: Diagnosis not present

## 2022-11-22 DIAGNOSIS — E119 Type 2 diabetes mellitus without complications: Secondary | ICD-10-CM | POA: Diagnosis not present

## 2022-11-22 DIAGNOSIS — E1121 Type 2 diabetes mellitus with diabetic nephropathy: Secondary | ICD-10-CM | POA: Diagnosis not present

## 2022-11-22 DIAGNOSIS — D692 Other nonthrombocytopenic purpura: Secondary | ICD-10-CM | POA: Diagnosis not present

## 2022-11-22 DIAGNOSIS — G35 Multiple sclerosis: Secondary | ICD-10-CM | POA: Diagnosis not present

## 2022-11-22 DIAGNOSIS — Z79899 Other long term (current) drug therapy: Secondary | ICD-10-CM | POA: Diagnosis not present

## 2022-11-22 DIAGNOSIS — E782 Mixed hyperlipidemia: Secondary | ICD-10-CM | POA: Diagnosis not present

## 2022-12-03 DIAGNOSIS — Z1211 Encounter for screening for malignant neoplasm of colon: Secondary | ICD-10-CM | POA: Diagnosis not present

## 2022-12-03 DIAGNOSIS — E119 Type 2 diabetes mellitus without complications: Secondary | ICD-10-CM | POA: Diagnosis not present

## 2022-12-05 LAB — LAB REPORT - SCANNED
Creatinine, POC: 66 mg/dL
Microalb Creat Ratio: 16.3
Microalbumin, Urine: 1.08

## 2022-12-12 ENCOUNTER — Telehealth: Payer: Self-pay | Admitting: Internal Medicine

## 2022-12-12 NOTE — Telephone Encounter (Signed)
Patient called requested to speak with a nurse said she has blood in her stool.

## 2022-12-13 NOTE — Telephone Encounter (Signed)
Pt stated that she recently had a physical at the beginning of March and had labs done which stated that she was positive for blood in her stool and was recommended for her to follow with with her GI doctor. Pt stated that she is bed ridden and uses a [employer] fast streamer at home and she cannot get up on an exam table. Pt stated that her husband has to rent a transportation Lucianne Lei so she wants to ensure that it is necessary for her to have an office visit.  Pt stated that she does have a history of hemorrhoids, pt husband has not seen any evidence of any blood in stool.  Increase gas for the last three weeks is only GI complaint. Pt PCP office contacted and requested recent labs and office notes be faxed over.

## 2022-12-13 NOTE — Telephone Encounter (Signed)
Left message for pt to call back  °

## 2022-12-17 NOTE — Telephone Encounter (Signed)
Please see notes below.  Office notes received from Pt PCP:  Pt was contacted for a symptom update. Pt stated that they have not observed any blood in stool Office notes were copied. Copy placed on Dr. Henrene Pastor desk to review and Copy sent to be scanned into epic.  Please review and advise

## 2022-12-19 ENCOUNTER — Encounter: Payer: Self-pay | Admitting: Internal Medicine

## 2022-12-19 DIAGNOSIS — I10 Essential (primary) hypertension: Secondary | ICD-10-CM | POA: Diagnosis not present

## 2022-12-19 DIAGNOSIS — E782 Mixed hyperlipidemia: Secondary | ICD-10-CM | POA: Diagnosis not present

## 2022-12-19 DIAGNOSIS — G35 Multiple sclerosis: Secondary | ICD-10-CM | POA: Diagnosis not present

## 2022-12-19 DIAGNOSIS — E1169 Type 2 diabetes mellitus with other specified complication: Secondary | ICD-10-CM | POA: Diagnosis not present

## 2022-12-19 DIAGNOSIS — F330 Major depressive disorder, recurrent, mild: Secondary | ICD-10-CM | POA: Diagnosis not present

## 2022-12-19 NOTE — Telephone Encounter (Signed)
Please see note from pt and advise if ok for pt to take.

## 2022-12-21 ENCOUNTER — Other Ambulatory Visit: Payer: Self-pay | Admitting: Neurology

## 2022-12-21 DIAGNOSIS — G35 Multiple sclerosis: Secondary | ICD-10-CM

## 2022-12-21 DIAGNOSIS — Z5181 Encounter for therapeutic drug level monitoring: Secondary | ICD-10-CM

## 2022-12-23 ENCOUNTER — Other Ambulatory Visit: Payer: Self-pay

## 2022-12-23 MED ORDER — BACLOFEN 10 MG PO TABS: ORAL_TABLET | ORAL | 3 refills | Status: DC

## 2022-12-24 NOTE — Telephone Encounter (Signed)
She is significantly debilitated and not appropriate for any significant endoscopic workup, particularly since she has no significant GI symptoms.  No further workup or plans on my part.  Available if needed for any significant clinical change or questions.  Thanks

## 2022-12-24 NOTE — Telephone Encounter (Signed)
Pt made aware of Dr. Perry recommendations:  Pt verbalized understanding with all questions answered.   

## 2022-12-26 ENCOUNTER — Ambulatory Visit: Payer: PPO | Admitting: Neurology

## 2022-12-26 ENCOUNTER — Encounter: Payer: Self-pay | Admitting: Neurology

## 2022-12-26 VITALS — BP 120/85 | HR 96 | Ht 64.0 in | Wt 205.0 lb

## 2022-12-26 DIAGNOSIS — Z993 Dependence on wheelchair: Secondary | ICD-10-CM

## 2022-12-26 DIAGNOSIS — G35D Multiple sclerosis, unspecified: Secondary | ICD-10-CM

## 2022-12-26 DIAGNOSIS — I63411 Cerebral infarction due to embolism of right middle cerebral artery: Secondary | ICD-10-CM | POA: Diagnosis not present

## 2022-12-26 DIAGNOSIS — G35 Multiple sclerosis: Secondary | ICD-10-CM

## 2022-12-26 MED ORDER — GABAPENTIN 300 MG PO CAPS: ORAL_CAPSULE | ORAL | 3 refills | Status: DC

## 2022-12-26 MED ORDER — ALPRAZOLAM 1 MG PO TABS: 1.0000 mg | ORAL_TABLET | Freq: Every evening | ORAL | 0 refills | Status: DC | PRN

## 2022-12-26 NOTE — Progress Notes (Signed)
Patient: Gabriela Rice Date of Birth: 1956/07/26  Reason for Visit: Follow up History from: Patient, husband Primary Neurologist: [facility]   ASSESSMENT AND PLAN 67 y.o. year old female   1.  Multiple sclerosis secondary progressive 2.  Paraparesis 3.  Neurogenic bowel and bladder 4.  Autoimmune hepatitis 5.  History of stroke in 2020, right brain  -Maxie remains overall stable, she will remain on Copaxone -We will continue her chronic medications, I will refill her Xanax and gabapentin today -She remains on Eliquis for history of embolic stroke possibly due to PFO and DVT -She is awaiting her new electric wheelchair -Follow-up in 6 months or sooner if needed  HISTORY OF PRESENT ILLNESS: Today 12/26/22 Here with husband. Remains on Copaxone. Remains on Xanax at bedtime, on Eliquis, baclofen, gabapentin, tramadol. Feels MS is stable. Still waiting for new electric wheelchair. Remains wheelchair bound, has hoyer lift. Hepatitis is doing fine, remains on Imuran, lowered the dose. Has noted some vision changes, eye doctor appointment coming up.  No other changes.  HISTORY  06/25/22 Dr. Felecia Shelling: She is a 67 yo woman who was diagnosed with MS in 1998 afer presenting with slurred speech, weakness and poor gait.   She saw Dr. Jannifer Franklin initially and was placed on Betaseron.  Her LFTs increased and she discontinued.   She took nothing for several years and then started Copaxone around 2000 or 2001.   At that time, she was walking fairly well.   She began to have more difficulty with her gait and needed a cane by 2010 and a walker a few years later.    After she broke her ankles in August  2020 she became unable to walk and has been wheelchair bound since.   She had a stroke December 2020 and had more lest arm weakness and slurred speech.    Her strength improved but not to baseline.   The embolic right parietal stroke was felt to be due to PFO and DVT in leg.   Since the stroke, she has been on  Eliquis   She reports her PCP is working to get her a power wheelchair..  She reports having a physical therapy appointment soon   Currently, she is wheelchair bound and does not take steps.    Both legs  are weak, left worse than rigtht.  Can not bear weight.   She has some spasticity in legs.   She has just mild weakness in the left arm.  She has numbness in her legs.   She has urinary incontinence.   She cannot get to a toilet so needs to use Depends.    Her vision is ok.   She has never had ON.   She has fatigue.  She sleeps well most nights.   She notes mild reduced short term memory.   She has no trouble coming up with the right word.    She has some depression and is sometimes irritable.      She has autoimmune hepatitis and is on azathioprine   She is on gabapentin and trmadol for pain.  REVIEW OF SYSTEMS: Out of a complete 14 system review of symptoms, the patient complains only of the following symptoms, and all other reviewed systems are negative.  See HPI  ALLERGIES: Allergies  Allergen Reactions   Penicillins Other (See Comments)    seizure as child Has patient had a PCN reaction causing immediate rash, facial/tongue/throat swelling, SOB or lightheadedness with hypotension: No Has patient  had a PCN reaction causing severe rash involving mucus membranes or skin necrosis:No Has patient had a PCN reaction that required hospitalizationNo Has patient had a PCN reaction occurring within the last 10 years:No If all of the above answers are "NO", then may proceed with Cephalosporin use.     Crestor [Rosuvastatin]     Other reaction(s): Elevated LFT's   Lipitor [Atorvastatin]     Other reaction(s): muscle pain   Lisinopril     Other reaction(s): cough   Sulfamethoxazole-Trimethoprim     Other reaction(s): Dizziness, Loss of feeling in tongue   Zocor [Simvastatin]     Other reaction(s): muscle pain   Codeine Swelling    hands swell    HOME MEDICATIONS: Outpatient  Medications Prior to Visit  Medication Sig Dispense Refill   ALPRAZolam (XANAX) 1 MG tablet TAKE 1 TABLET BY MOUTH AT BEDTIME AS NEEDED 90 tablet 0   apixaban (ELIQUIS) 5 MG TABS tablet TAKE 1 TABLET(5 MG) BY MOUTH TWICE DAILY 180 tablet 0   azaTHIOprine (IMURAN) 50 MG tablet TAKE 3 TABLETS(150 MG) BY MOUTH DAILY 270 tablet 3   baclofen (LIORESAL) 10 MG tablet One tablet in the morning and evening and 1.5 tablets at midday 315 tablet 3   cetirizine (ZYRTEC) 10 MG tablet Take 10 mg by mouth daily.     Cholecalciferol (VITAMIN D) 2000 UNITS CAPS Take 2,000 Units by mouth daily.      DULoxetine (CYMBALTA) 30 MG capsule Take 90 mg by mouth daily.      gabapentin (NEURONTIN) 300 MG capsule TAKE 1 CAPSULE BY MOUTH THREE TIMES A DAY 270 capsule 3   Glatiramer Acetate 40 MG/ML SOSY Use as directed by prescriber. Give 40mg  (66mL) as asubcutaneous injection three (3) times a week. PROTECT FROM LIGHT 12 mL 11   insulin lispro (HUMALOG) 100 UNIT/ML KwikPen Inject into the skin as needed.     losartan (COZAAR) 100 MG tablet Take 100 mg by mouth daily.     metFORMIN (GLUCOPHAGE) 500 MG tablet Take 500 mg by mouth 2 (two) times daily with a meal.      ONETOUCH ULTRA test strip 2 (two) times daily as needed.     polyethylene glycol powder (GLYCOLAX/MIRALAX) 17 GM/SCOOP powder Take 17 g by mouth daily as needed for mild constipation.     pravastatin (PRAVACHOL) 40 MG tablet Take 40 mg by mouth daily.      traMADol (ULTRAM) 50 MG tablet TAKE 1 TABLET(50 MG) BY MOUTH THREE TIMES DAILY AS NEEDED [id number] tablet 0   TRULICITY [id number] SOPN Inject 0.75 mg into the skin once a week.     No facility-administered medications prior to visit.    PAST MEDICAL HISTORY: Past Medical History:  Diagnosis Date   Abnormality of gait 02/16/2013   Acute bilateral ankle pain 11/15/2019   Allergic rhinitis    Allergy    Anxiety    Autoimmune hepatitis ([place])    Autoimmune hepatitis ([place])    Colon polyps    Diverticulosis     DM (diabetes mellitus) (HCC)    Elevated LFTs    Hemorrhoids    Hemorrhoids    Hepatitis    with cholestasis   Hyperlipidemia    Hypertension    Migraines    Multiple sclerosis ([place])    Obesity    Vitamin D deficiency     PAST SURGICAL HISTORY: Past Surgical History:  Procedure Laterality Date   ABDOMINAL HYSTERECTOMY     CESAREAN SECTION  x 2   MYOECTOMY     TONSILLECTOMY AND ADENOIDECTOMY      FAMILY HISTORY: Family History  Problem Relation Age of Onset   Diabetes Mother    Multiple sclerosis Mother    Cirrhosis Father    Heart disease Maternal Grandmother    Colon cancer Maternal Aunt    Uterine cancer Maternal Aunt    Brain cancer Maternal Uncle    Colon cancer Maternal Uncle    Breast cancer Neg Hx     SOCIAL HISTORY: Social History   Socioeconomic History   Marital status: Married    Spouse name: Shanon Brow   Number of children: 2   Years of education: HS   Highest education level: Not on file  Occupational History   Occupation: Unemployed  Tobacco Use   Smoking status: Former    Packs/day: 0.25    Years: 4.00    Additional pack years: 0.00    Total pack years: 1.00    Types: Cigarettes    Quit date: 10/01/1995    Years since quitting: 27.2   Smokeless tobacco: Never  Vaping Use   Vaping Use: Never used  Substance and Sexual Activity   Alcohol use: No    Alcohol/week: 0.0 standard drinks of alcohol   Drug use: No   Sexual activity: Not on file  Other Topics Concern   Not on file  Social History Narrative   Lives at home w/ her husband   Right-handed   Drinks 1 cup of coffee per day   Social Determinants of Health   Financial Resource Strain: Not on file  Food Insecurity: No Food Insecurity (08/13/2022)   Hunger Vital Sign    Worried About Running Out of Food in the Last Year: Never true    Ran Out of Food in the Last Year: Never true  Transportation Needs: No Transportation Needs (08/13/2022)   PRAPARE - Radiographer, therapeutic (Medical): No    Lack of Transportation (Non-Medical): No  Physical Activity: Not on file  Stress: Not on file  Social Connections: Not on file  Intimate Partner Violence: Not on file   PHYSICAL EXAM  Vitals:   12/26/22 1255  BP: 120/85  Pulse: 96  Weight: 205 lb (93 kg)  Height: 5\' 4"  (1.626 m)   Body mass index is 35.19 kg/m.  Generalized: Well developed, in no acute distress  Neurological examination  Mentation: Alert oriented to time, place, history taking. Follows all commands speech and language fluent Cranial nerve II-XII: Pupils were equal round reactive to light. Extraocular movements were full, visual field were full on confrontational test. Facial sensation and strength were normal.  Head turning and shoulder shrug  were normal and symmetric. Motor: 4/5 bilateral upper extremities, 1/5 right leg, 0/5 left, increased tone bilateral lower extremities Sensory: Sensory testing is intact to soft touch on all 4 extremities. No evidence of extinction is noted.  Coordination: Cerebellar testing reveals good finger-nose-finger bilaterally, cannot do the legs Gait and station: Is nonambulatory Reflexes: Deep tendon reflexes are symmetric and normal bilaterally.   DIAGNOSTIC DATA (LABS, IMAGING, TESTING) - I reviewed patient records, labs, notes, testing and imaging myself where available.  Lab Results  Component Value Date   WBC 5.4 03/21/2022   HGB 14.7 03/21/2022   HCT 43.7 03/21/2022   MCV 93.6 03/21/2022   PLT 271.0 03/21/2022      Component Value Date/Time   NA 138 03/21/2022 1436   NA 138 12/18/2021  1457   NA 142 09/16/2014 1420   K 4.0 03/21/2022 1436   K 3.9 09/16/2014 1420   CL 101 03/21/2022 1436   CL 105 09/16/2014 1420   CO2 29 03/21/2022 1436   CO2 29 09/16/2014 1420   GLUCOSE 157 (H) 03/21/2022 1436   GLUCOSE 111 (H) 09/16/2014 1420   BUN 14 03/21/2022 1436   BUN 15 12/18/2021 1457   BUN 21 (H) 09/16/2014 1420   CREATININE 0.50  03/21/2022 1436   CREATININE 0.86 09/16/2014 1420   CALCIUM 9.8 03/21/2022 1436   CALCIUM 9.2 09/16/2014 1420   PROT 7.6 06/25/2022 1609   PROT 7.0 12/18/2021 1457   PROT 7.7 09/16/2014 1420   ALBUMIN 4.3 06/25/2022 1609   ALBUMIN 4.6 12/18/2021 1457   ALBUMIN 4.0 09/16/2014 1420   AST 40 (H) 06/25/2022 1609   AST 41 (H) 09/16/2014 1420   ALT 27 06/25/2022 1609   ALT 42 09/16/2014 1420   ALKPHOS 62 06/25/2022 1609   ALKPHOS 69 09/16/2014 1420   BILITOT 0.7 06/25/2022 1609   BILITOT 0.7 12/18/2021 1457   BILITOT 0.6 09/16/2014 1420   GFRNONAA >60 09/29/2019 0333   GFRNONAA >60 09/16/2014 1420   GFRAA >60 09/29/2019 0333   GFRAA >60 09/16/2014 1420   Lab Results  Component Value Date   CHOL 200 09/24/2019   HDL 52 09/24/2019   LDLCALC 125 (H) 09/24/2019   TRIG 114 09/24/2019   CHOLHDL 3.8 09/24/2019   Lab Results  Component Value Date   HGBA1C 6.7 (H) 09/24/2019   No results found for: "VITAMINB12" Lab Results  Component Value Date   TSH 0.266 (L) 08/22/2015    Butler Denmark, AGNP-C, DNP 12/26/2022, 1:16 PM [facility] [address], [place], [place] [address] [phone]

## 2023-01-28 DIAGNOSIS — G35 Multiple sclerosis: Secondary | ICD-10-CM | POA: Diagnosis not present

## 2023-01-28 DIAGNOSIS — I639 Cerebral infarction, unspecified: Secondary | ICD-10-CM | POA: Diagnosis not present

## 2023-02-03 ENCOUNTER — Inpatient Hospital Stay (HOSPITAL_COMMUNITY)
Admission: EM | Admit: 2023-02-03 | Discharge: 2023-02-06 | DRG: 871 | Disposition: A | Discharge status: Home / Self Care | Payer: PPO | Attending: Family Medicine | Admitting: Family Medicine

## 2023-02-03 ENCOUNTER — Emergency Department (HOSPITAL_COMMUNITY): Payer: PPO

## 2023-02-03 ENCOUNTER — Encounter (HOSPITAL_COMMUNITY): Payer: Self-pay

## 2023-02-03 ENCOUNTER — Other Ambulatory Visit: Payer: Self-pay

## 2023-02-03 DIAGNOSIS — Z7401 Bed confinement status: Secondary | ICD-10-CM | POA: Diagnosis not present

## 2023-02-03 DIAGNOSIS — A419 Sepsis, unspecified organism: Secondary | ICD-10-CM | POA: Diagnosis not present

## 2023-02-03 DIAGNOSIS — Z820 Family history of epilepsy and other diseases of the nervous system: Secondary | ICD-10-CM

## 2023-02-03 DIAGNOSIS — J189 Pneumonia, unspecified organism: Secondary | ICD-10-CM | POA: Diagnosis not present

## 2023-02-03 DIAGNOSIS — R81 Glycosuria: Secondary | ICD-10-CM | POA: Diagnosis present

## 2023-02-03 DIAGNOSIS — Z7901 Long term (current) use of anticoagulants: Secondary | ICD-10-CM

## 2023-02-03 DIAGNOSIS — Z993 Dependence on wheelchair: Secondary | ICD-10-CM | POA: Diagnosis not present

## 2023-02-03 DIAGNOSIS — K754 Autoimmune hepatitis: Secondary | ICD-10-CM | POA: Diagnosis not present

## 2023-02-03 DIAGNOSIS — Z79624 Long term (current) use of inhibitors of nucleotide synthesis: Secondary | ICD-10-CM

## 2023-02-03 DIAGNOSIS — I639 Cerebral infarction, unspecified: Secondary | ICD-10-CM

## 2023-02-03 DIAGNOSIS — Z833 Family history of diabetes mellitus: Secondary | ICD-10-CM | POA: Diagnosis not present

## 2023-02-03 DIAGNOSIS — Z8249 Family history of ischemic heart disease and other diseases of the circulatory system: Secondary | ICD-10-CM

## 2023-02-03 DIAGNOSIS — R0902 Hypoxemia: Secondary | ICD-10-CM | POA: Diagnosis not present

## 2023-02-03 DIAGNOSIS — Z86010 Personal history of colonic polyps: Secondary | ICD-10-CM

## 2023-02-03 DIAGNOSIS — I10 Essential (primary) hypertension: Secondary | ICD-10-CM | POA: Diagnosis present

## 2023-02-03 DIAGNOSIS — Z794 Long term (current) use of insulin: Secondary | ICD-10-CM

## 2023-02-03 DIAGNOSIS — G43909 Migraine, unspecified, not intractable, without status migrainosus: Secondary | ICD-10-CM | POA: Diagnosis present

## 2023-02-03 DIAGNOSIS — R232 Flushing: Secondary | ICD-10-CM | POA: Diagnosis not present

## 2023-02-03 DIAGNOSIS — E559 Vitamin D deficiency, unspecified: Secondary | ICD-10-CM | POA: Diagnosis present

## 2023-02-03 DIAGNOSIS — E782 Mixed hyperlipidemia: Secondary | ICD-10-CM

## 2023-02-03 DIAGNOSIS — R531 Weakness: Secondary | ICD-10-CM | POA: Diagnosis not present

## 2023-02-03 DIAGNOSIS — R5381 Other malaise: Secondary | ICD-10-CM | POA: Diagnosis not present

## 2023-02-03 DIAGNOSIS — Z6835 Body mass index (BMI) 35.0-35.9, adult: Secondary | ICD-10-CM | POA: Diagnosis not present

## 2023-02-03 DIAGNOSIS — F419 Anxiety disorder, unspecified: Secondary | ICD-10-CM | POA: Diagnosis present

## 2023-02-03 DIAGNOSIS — I959 Hypotension, unspecified: Secondary | ICD-10-CM | POA: Diagnosis not present

## 2023-02-03 DIAGNOSIS — J90 Pleural effusion, not elsewhere classified: Secondary | ICD-10-CM | POA: Diagnosis not present

## 2023-02-03 DIAGNOSIS — E669 Obesity, unspecified: Secondary | ICD-10-CM | POA: Diagnosis present

## 2023-02-03 DIAGNOSIS — R000 Tachycardia, unspecified: Secondary | ICD-10-CM | POA: Diagnosis not present

## 2023-02-03 DIAGNOSIS — J309 Allergic rhinitis, unspecified: Secondary | ICD-10-CM | POA: Diagnosis present

## 2023-02-03 DIAGNOSIS — Z87891 Personal history of nicotine dependence: Secondary | ICD-10-CM | POA: Diagnosis not present

## 2023-02-03 DIAGNOSIS — Z7985 Long-term (current) use of injectable non-insulin antidiabetic drugs: Secondary | ICD-10-CM

## 2023-02-03 DIAGNOSIS — Z1152 Encounter for screening for COVID-19: Secondary | ICD-10-CM | POA: Diagnosis not present

## 2023-02-03 DIAGNOSIS — Z87440 Personal history of urinary (tract) infections: Secondary | ICD-10-CM | POA: Diagnosis not present

## 2023-02-03 DIAGNOSIS — Z90710 Acquired absence of both cervix and uterus: Secondary | ICD-10-CM

## 2023-02-03 DIAGNOSIS — K5900 Constipation, unspecified: Secondary | ICD-10-CM | POA: Diagnosis present

## 2023-02-03 DIAGNOSIS — N390 Urinary tract infection, site not specified: Secondary | ICD-10-CM | POA: Diagnosis present

## 2023-02-03 DIAGNOSIS — N3000 Acute cystitis without hematuria: Secondary | ICD-10-CM

## 2023-02-03 DIAGNOSIS — Z7984 Long term (current) use of oral hypoglycemic drugs: Secondary | ICD-10-CM

## 2023-02-03 DIAGNOSIS — Z885 Allergy status to narcotic agent status: Secondary | ICD-10-CM

## 2023-02-03 DIAGNOSIS — Z8673 Personal history of transient ischemic attack (TIA), and cerebral infarction without residual deficits: Secondary | ICD-10-CM

## 2023-02-03 DIAGNOSIS — R509 Fever, unspecified: Secondary | ICD-10-CM | POA: Diagnosis not present

## 2023-02-03 DIAGNOSIS — E1140 Type 2 diabetes mellitus with diabetic neuropathy, unspecified: Secondary | ICD-10-CM | POA: Diagnosis not present

## 2023-02-03 DIAGNOSIS — N200 Calculus of kidney: Secondary | ICD-10-CM | POA: Diagnosis not present

## 2023-02-03 DIAGNOSIS — R350 Frequency of micturition: Secondary | ICD-10-CM | POA: Diagnosis present

## 2023-02-03 DIAGNOSIS — R059 Cough, unspecified: Secondary | ICD-10-CM | POA: Diagnosis not present

## 2023-02-03 DIAGNOSIS — J9811 Atelectasis: Secondary | ICD-10-CM | POA: Diagnosis present

## 2023-02-03 DIAGNOSIS — E1165 Type 2 diabetes mellitus with hyperglycemia: Secondary | ICD-10-CM

## 2023-02-03 DIAGNOSIS — Z888 Allergy status to other drugs, medicaments and biological substances status: Secondary | ICD-10-CM

## 2023-02-03 DIAGNOSIS — Z880 Allergy status to penicillin: Secondary | ICD-10-CM

## 2023-02-03 DIAGNOSIS — G35 Multiple sclerosis: Secondary | ICD-10-CM | POA: Diagnosis not present

## 2023-02-03 DIAGNOSIS — Z79899 Other long term (current) drug therapy: Secondary | ICD-10-CM

## 2023-02-03 DIAGNOSIS — Z882 Allergy status to sulfonamides status: Secondary | ICD-10-CM

## 2023-02-03 DIAGNOSIS — K8020 Calculus of gallbladder without cholecystitis without obstruction: Secondary | ICD-10-CM | POA: Diagnosis not present

## 2023-02-03 LAB — URINALYSIS, W/ REFLEX TO CULTURE (INFECTION SUSPECTED)
Bilirubin Urine: NEGATIVE
Glucose, UA: >=500 mg/dL — AB
Ketones, ur: NEGATIVE mg/dL
Nitrite: NEGATIVE
Protein, ur: NEGATIVE mg/dL
Specific Gravity, Urine: 1.014 (ref 1.005–1.030)
WBC, UA: >50 WBC/hpf (ref 0–5)
pH: 5 (ref 5.0–8.0)

## 2023-02-03 LAB — CBC WITH DIFFERENTIAL/PLATELET
Abs Immature Granulocytes: 0.05 10*3/uL (ref 0.00–0.07)
Basophils Absolute: 0 10*3/uL (ref 0.0–0.1)
Basophils Relative: 0 %
Eosinophils Absolute: 0 10*3/uL (ref 0.0–0.5)
Eosinophils Relative: 0 %
HCT: 41.3 % (ref 36.0–46.0)
Hemoglobin: 14.1 g/dL (ref 12.0–15.0)
Immature Granulocytes: 0 %
Lymphocytes Relative: 12 %
Lymphs Abs: 1.5 10*3/uL (ref 0.7–4.0)
MCH: 30.7 pg (ref 26.0–34.0)
MCHC: 34.1 g/dL (ref 30.0–36.0)
MCV: 89.8 fL (ref 80.0–100.0)
Monocytes Absolute: 1.2 10*3/uL — ABNORMAL HIGH (ref 0.1–1.0)
Monocytes Relative: 9 %
Neutro Abs: 10.1 10*3/uL — ABNORMAL HIGH (ref 1.7–7.7)
Neutrophils Relative %: 79 %
Platelets: 242 10*3/uL (ref 150–400)
RBC: 4.6 MIL/uL (ref 3.87–5.11)
RDW: 14.7 % (ref 11.5–15.5)
WBC: 12.9 10*3/uL — ABNORMAL HIGH (ref 4.0–10.5)
nRBC: 0 % (ref 0.0–0.2)

## 2023-02-03 LAB — COMPREHENSIVE METABOLIC PANEL
ALT: 20 U/L (ref 0–44)
AST: 23 U/L (ref 15–41)
Albumin: 3.8 g/dL (ref 3.5–5.0)
Alkaline Phosphatase: 55 U/L (ref 38–126)
Anion gap: 10 (ref 5–15)
BUN: 12 mg/dL (ref 8–23)
CO2: 24 mmol/L (ref 22–32)
Calcium: 9 mg/dL (ref 8.9–10.3)
Chloride: 99 mmol/L (ref 98–111)
Creatinine, Ser: 0.54 mg/dL (ref 0.44–1.00)
GFR, Estimated: >60 mL/min (ref >60)
Glucose, Bld: 224 mg/dL — ABNORMAL HIGH (ref 70–99)
Potassium: 3.7 mmol/L (ref 3.5–5.1)
Sodium: 133 mmol/L — ABNORMAL LOW (ref 135–145)
Total Bilirubin: 0.8 mg/dL (ref 0.3–1.2)
Total Protein: 7.7 g/dL (ref 6.5–8.1)

## 2023-02-03 LAB — URINE CULTURE

## 2023-02-03 LAB — RESP PANEL BY RT-PCR (RSV, FLU A&B, COVID)  RVPGX2
Influenza A by PCR: NEGATIVE
Influenza B by PCR: NEGATIVE
Resp Syncytial Virus by PCR: NEGATIVE
SARS Coronavirus 2 by RT PCR: NEGATIVE

## 2023-02-03 LAB — LACTIC ACID, PLASMA
Lactic Acid, Venous: 1.2 mmol/L (ref 0.5–1.9)
Lactic Acid, Venous: 1.4 mmol/L (ref 0.5–1.9)

## 2023-02-03 LAB — PROTIME-INR
INR: 1.3 — ABNORMAL HIGH (ref 0.8–1.2)
Prothrombin Time: 16.2 seconds — ABNORMAL HIGH (ref 11.4–15.2)

## 2023-02-03 LAB — MAGNESIUM: Magnesium: 2 mg/dL (ref 1.7–2.4)

## 2023-02-03 MED ORDER — MENTHOL 3 MG MT LOZG: 1.0000 | LOZENGE | OROMUCOSAL | Status: DC | PRN

## 2023-02-03 MED ORDER — ENOXAPARIN SODIUM 40 MG/0.4ML IJ SOSY
40.0000 mg | PREFILLED_SYRINGE | INTRAMUSCULAR | Status: DC
  Filled 2023-02-03: qty 0.4

## 2023-02-03 MED ORDER — GABAPENTIN 300 MG PO CAPS
300.0000 mg | ORAL_CAPSULE | Freq: Three times a day (TID) | ORAL | Status: DC
  Administered 2023-02-04 – 2023-02-06 (×7): 300 mg via ORAL
  Filled 2023-02-03 (×7): qty 1

## 2023-02-03 MED ORDER — GUAIFENESIN-DM 100-10 MG/5ML PO SYRP: 5.0000 mL | ORAL_SOLUTION | ORAL | Status: DC | PRN

## 2023-02-03 MED ORDER — SODIUM CHLORIDE 0.9 % IV SOLN
2.0000 g | Freq: Once | INTRAVENOUS | Status: AC
  Administered 2023-02-03: 2 g via INTRAVENOUS
  Filled 2023-02-03: qty 12.5

## 2023-02-03 MED ORDER — DM-GUAIFENESIN ER 30-600 MG PO TB12
1.0000 | ORAL_TABLET | Freq: Two times a day (BID) | ORAL | Status: DC
  Administered 2023-02-03 – 2023-02-06 (×6): 1 via ORAL
  Filled 2023-02-03 (×6): qty 1

## 2023-02-03 MED ORDER — LACTATED RINGERS IV BOLUS (SEPSIS)
1000.0000 mL | Freq: Once | INTRAVENOUS | Status: AC
  Administered 2023-02-03: 1000 mL via INTRAVENOUS

## 2023-02-03 MED ORDER — PRAVASTATIN SODIUM 40 MG PO TABS
40.0000 mg | ORAL_TABLET | Freq: Every day | ORAL | Status: DC
  Administered 2023-02-04 – 2023-02-06 (×3): 40 mg via ORAL
  Filled 2023-02-03 (×3): qty 1

## 2023-02-03 MED ORDER — SODIUM CHLORIDE 0.9 % IV SOLN
2.0000 g | Freq: Three times a day (TID) | INTRAVENOUS | Status: DC
  Administered 2023-02-04 – 2023-02-06 (×8): 2 g via INTRAVENOUS
  Filled 2023-02-03 (×8): qty 12.5

## 2023-02-03 MED ORDER — ONDANSETRON HCL 4 MG PO TABS: 4.0000 mg | ORAL_TABLET | Freq: Four times a day (QID) | ORAL | Status: DC | PRN

## 2023-02-03 MED ORDER — LACTATED RINGERS IV SOLN: INTRAVENOUS | Status: AC

## 2023-02-03 MED ORDER — METRONIDAZOLE 500 MG/100ML IV SOLN
500.0000 mg | Freq: Once | INTRAVENOUS | Status: AC
  Administered 2023-02-03: 500 mg via INTRAVENOUS
  Filled 2023-02-03: qty 100

## 2023-02-03 MED ORDER — ACETAMINOPHEN 325 MG PO TABS
650.0000 mg | ORAL_TABLET | Freq: Four times a day (QID) | ORAL | Status: DC | PRN
  Administered 2023-02-04 – 2023-02-06 (×5): 650 mg via ORAL
  Filled 2023-02-03 (×6): qty 2

## 2023-02-03 MED ORDER — IOHEXOL 350 MG/ML SOLN
100.0000 mL | Freq: Once | INTRAVENOUS | Status: AC | PRN
  Administered 2023-02-03: 100 mL via INTRAVENOUS

## 2023-02-03 MED ORDER — VANCOMYCIN HCL 2000 MG/400ML IV SOLN
2000.0000 mg | Freq: Once | INTRAVENOUS | Status: AC
  Administered 2023-02-03: 2000 mg via INTRAVENOUS
  Filled 2023-02-03: qty 400

## 2023-02-03 MED ORDER — ONDANSETRON HCL 4 MG/2ML IJ SOLN: 4.0000 mg | Freq: Four times a day (QID) | INTRAMUSCULAR | Status: DC | PRN

## 2023-02-03 MED ORDER — APIXABAN 5 MG PO TABS
5.0000 mg | ORAL_TABLET | Freq: Two times a day (BID) | ORAL | Status: DC
  Administered 2023-02-04 – 2023-02-06 (×6): 5 mg via ORAL
  Filled 2023-02-03 (×6): qty 1

## 2023-02-03 MED ORDER — ACETAMINOPHEN 650 MG RE SUPP: 650.0000 mg | Freq: Four times a day (QID) | RECTAL | Status: DC | PRN

## 2023-02-03 MED ORDER — LOSARTAN POTASSIUM 50 MG PO TABS
100.0000 mg | ORAL_TABLET | Freq: Every day | ORAL | Status: DC
  Administered 2023-02-04 – 2023-02-06 (×3): 100 mg via ORAL
  Filled 2023-02-03 (×3): qty 2

## 2023-02-03 MED ORDER — INSULIN ASPART 100 UNIT/ML IJ SOLN
0.0000 [IU] | Freq: Three times a day (TID) | INTRAMUSCULAR | Status: DC
  Administered 2023-02-04: 2 [IU] via SUBCUTANEOUS
  Administered 2023-02-04 (×2): 3 [IU] via SUBCUTANEOUS
  Administered 2023-02-05 (×2): 2 [IU] via SUBCUTANEOUS
  Administered 2023-02-05 – 2023-02-06 (×2): 3 [IU] via SUBCUTANEOUS

## 2023-02-03 MED ORDER — VANCOMYCIN HCL 1250 MG/250ML IV SOLN
1250.0000 mg | INTRAVENOUS | Status: DC
  Administered 2023-02-04 – 2023-02-05 (×2): 1250 mg via INTRAVENOUS
  Filled 2023-02-03 (×2): qty 250

## 2023-02-03 MED ORDER — SODIUM CHLORIDE 0.9 % IV SOLN: INTRAVENOUS | Status: AC

## 2023-02-03 MED ORDER — VANCOMYCIN HCL IN DEXTROSE 1-5 GM/200ML-% IV SOLN: 1000.0000 mg | Freq: Once | INTRAVENOUS | Status: DC

## 2023-02-03 NOTE — H&P (Signed)
History and Physical    Patient: Gabriela Rice DOB: 1956-01-22 DOA: 02/03/2023 DOS: the patient was seen and examined on 02/03/2023 PCP: Mila Palmer, MD  Patient coming from: Home  Chief Complaint:  Chief Complaint  Patient presents with   Cough   HPI: Gabriela Rice is a 67 y.o. female with medical history significant of hypertension, hyperlipidemia, type 2 diabetes mellitus, multiple sclerosis presents to the emergency department via EMS from home due to 3-day onset of sore throat, nonproductive cough and chest congestion.  She also complained of 2 to 3 weeks of intermittent burning sensation with urination and increased urinary frequency.  She is wheelchair and bedbound at baseline, home health nurse visited her today and noted that she had persistent tachycardia, so she was advised to go to the ED for further evaluation and management.  Patient endorsed mild headache, sore throat, but denies fever, chest pain, nausea, vomiting, abdominal pain.  ED Course:  In the emergency department, patient was tachycardic, but other vital signs are within normal range.  Workup in the ED showed normal CBC except for WBC of 12.9.  BMP was normal except for sodium of 193 and blood glucose of 224.  Magnesium 2.0, lactic acid was normal, urinalysis was positive for glycosuria large leukocytes and rare bacteria. Influenza A, B, SARS coronavirus 2, RSV was negative.  Blood culture pending. Chest x-ray showed mild left basilar scarring or atelectasis.  Questionable tiny left effusion versus pleural thickening CT angiogram of the chest with contrast, but showed mild posterior right lower lobe infiltrate and mild anterior left apical, lingula and posterolateral bibasilar atelectatic changes. CT abdomen and pelvis with contrast also showed mild luminal and posterior bibasilar atelectasis with mild amount of superimposed posterior right lower lobe infiltrate suspected. Patient was started on IV  vancomycin and cefepime, metronidazole was given.  Hospitalist was asked to admit patient for further evaluation and management.  Review of Systems: Review of systems as noted in the HPI. All other systems reviewed and are negative.   Past Medical History:  Diagnosis Date   Abnormality of gait 02/16/2013   Acute bilateral ankle pain 11/15/2019   Allergic rhinitis    Allergy    Anxiety    Autoimmune hepatitis (HCC)    Autoimmune hepatitis (HCC)    Colon polyps    Diverticulosis    DM (diabetes mellitus) (HCC)    Elevated LFTs    Hemorrhoids    Hemorrhoids    Hepatitis    with cholestasis   Hyperlipidemia    Hypertension    Migraines    Multiple sclerosis (HCC)    Obesity    Vitamin D deficiency    Past Surgical History:  Procedure Laterality Date   ABDOMINAL HYSTERECTOMY     CESAREAN SECTION     x 2   MYOECTOMY     TONSILLECTOMY AND ADENOIDECTOMY      Social History:  reports that she quit smoking about 27 years ago. Her smoking use included cigarettes. She has a 1.00 pack-year smoking history. She has never used smokeless tobacco. She reports that she does not drink alcohol and does not use drugs.   Allergies  Allergen Reactions   Penicillins Other (See Comments)    seizure as child Has patient had a PCN reaction causing immediate rash, facial/tongue/throat swelling, SOB or lightheadedness with hypotension: No Has patient had a PCN reaction causing severe rash involving mucus membranes or skin necrosis:No Has patient had a PCN reaction that required  hospitalizationNo Has patient had a PCN reaction occurring within the last 10 years:No If all of the above answers are "NO", then may proceed with Cephalosporin use.     Crestor [Rosuvastatin]     Other reaction(s): Elevated LFT's   Lipitor [Atorvastatin]     Other reaction(s): muscle pain   Lisinopril     Other reaction(s): cough   Sulfamethoxazole-Trimethoprim     Other reaction(s): Dizziness, Loss of feeling in  tongue   Zocor [Simvastatin]     Other reaction(s): muscle pain   Codeine Swelling    hands swell    Family History  Problem Relation Age of Onset   Diabetes Mother    Multiple sclerosis Mother    Cirrhosis Father    Heart disease Maternal Grandmother    Colon cancer Maternal Aunt    Uterine cancer Maternal Aunt    Brain cancer Maternal Uncle    Colon cancer Maternal Uncle    Breast cancer Neg Hx      Prior to Admission medications   Medication Sig Start Date End Date Taking? Authorizing Provider  ALPRAZolam Prudy Feeler) 1 MG tablet Take 1 tablet (1 mg total) by mouth at bedtime as needed. 12/26/22   Glean Salvo, NP  apixaban (ELIQUIS) 5 MG TABS tablet TAKE 1 TABLET(5 MG) BY MOUTH TWICE DAILY 11/06/22   Sater, Pearletha Furl, MD  azaTHIOprine (IMURAN) 50 MG tablet TAKE 3 TABLETS(150 MG) BY MOUTH DAILY 05/24/22   Hilarie Fredrickson, MD  baclofen (LIORESAL) 10 MG tablet One tablet in the morning and evening and 1.5 tablets at midday 12/23/22   Glean Salvo, NP  cetirizine (ZYRTEC) 10 MG tablet Take 10 mg by mouth daily.    [provider]  Cholecalciferol (VITAMIN D) 2000 UNITS CAPS Take 2,000 Units by mouth daily.     [provider]  DULoxetine (CYMBALTA) 30 MG capsule Take 90 mg by mouth daily.  05/03/19   [provider]  gabapentin (NEURONTIN) 300 MG capsule TAKE 1 CAPSULE BY MOUTH THREE TIMES A DAY 12/26/22   Glean Salvo, NP  Glatiramer Acetate 40 MG/ML SOSY Use as directed by prescriber. Give 40mg  (1mL) as asubcutaneous injection three (3) times a week. PROTECT FROM LIGHT 10/08/22   Glean Salvo, NP  insulin lispro (HUMALOG) 100 UNIT/ML KwikPen Inject into the skin as needed. 12/03/21   [provider]  losartan (COZAAR) 100 MG tablet Take 100 mg by mouth daily. 09/02/13   [provider]  metFORMIN (GLUCOPHAGE) 500 MG tablet Take 500 mg by mouth 2 (two) times daily with a meal.  03/15/13   [provider]  [facility] ULTRA test strip 2 (two)  times daily as needed. 01/30/22   [provider]  polyethylene glycol powder (GLYCOLAX/MIRALAX) 17 GM/SCOOP powder Take 17 g by mouth daily as needed for mild constipation. 09/29/19   Layne Benton, NP  pravastatin (PRAVACHOL) 40 MG tablet Take 40 mg by mouth daily.  07/04/14   [provider]  traMADol (ULTRAM) 50 MG tablet TAKE 1 TABLET(50 MG) BY MOUTH THREE TIMES DAILY AS NEEDED 12/23/22   Sater, Pearletha Furl, MD  TRULICITY 0.75 MG/0.5ML SOPN Inject 0.75 mg into the skin once a week. 06/20/20   [provider]    Physical Exam: BP (!) 159/88   Pulse (!) 116   Temp 98.4 F (36.9 C) (Oral)   Resp (!) 24   Wt 93 kg   SpO2 94%   BMI 35.19 kg/m  General: 67 y.o. year-old female well developed well nourished in no acute distress.  Alert and oriented x3. HEENT: NCAT, EOMI Neck: Supple, trachea medial Cardiovascular: Regular rate and rhythm with no rubs or gallops.  No thyromegaly or JVD noted.  No lower extremity edema. 2/4 pulses in all 4 extremities. Respiratory: Clear to auscultation with no wheezes or rales. Good inspiratory effort. Abdomen: Soft, nontender nondistended with normal bowel sounds x4 quadrants. Muskuloskeletal: No cyanosis, clubbing or edema noted bilaterally Neuro: CN II-XII intact, strength 5/5 x 4, sensation, reflexes intact Skin: No ulcerative lesions noted or rashes Psychiatry: Judgement and insight appear normal. Mood is appropriate for condition and setting          Labs on Admission:  Basic Metabolic Panel: Recent Labs  Lab 02/03/23 1637  NA 133*  K 3.7  CL 99  CO2 24  GLUCOSE 224*  BUN 12  CREATININE 0.54  CALCIUM 9.0  MG 2.0   Liver Function Tests: Recent Labs  Lab 02/03/23 1637  AST 23  ALT 20  ALKPHOS 55  BILITOT 0.8  PROT 7.7  ALBUMIN 3.8   No results for input(s): "LIPASE", "AMYLASE" in the last 168 hours. No results for input(s): "AMMONIA" in the last 168 hours. CBC: Recent Labs  Lab 02/03/23 1637  WBC  12.9*  NEUTROABS 10.1*  HGB 14.1  HCT 41.3  MCV 89.8  PLT 242   Cardiac Enzymes: No results for input(s): "CKTOTAL", "CKMB", "CKMBINDEX", "TROPONINI" in the last 168 hours.  BNP (last 3 results) No results for input(s): "BNP" in the last 8760 hours.  ProBNP (last 3 results) No results for input(s): "PROBNP" in the last 8760 hours.  CBG: No results for input(s): "GLUCAP" in the last 168 hours.  Radiological Exams on Admission: CT ABDOMEN PELVIS W CONTRAST  Result Date: 02/03/2023 CLINICAL DATA:  Cough, congestion and urinary frequency. EXAM: CT ABDOMEN AND PELVIS WITH CONTRAST TECHNIQUE: Multidetector CT imaging of the abdomen and pelvis was performed using the standard protocol following bolus administration of intravenous contrast. RADIATION DOSE REDUCTION: This exam was performed according to the departmental dose-optimization program which includes automated exposure control, adjustment of the mA and/or kV according to patient size and/or use of iterative reconstruction technique. CONTRAST:  OMNIPAQUE IOHEXOL 350 MG/ML SOLN COMPARISON:  August 20, 2015. FINDINGS: Lower chest: Mild lingular and posterior bibasilar atelectasis is seen. A mild amount of superimposed posterior right lower lobe infiltrate is suspected. Hepatobiliary: There is diffuse fatty infiltration of the liver parenchyma. A tiny focus of parenchymal low attenuation is seen within the right lobe of the liver. Numerous tiny gallstones are seen within the dependent portion of a moderately distended gallbladder lumen. There is no evidence of gallbladder wall thickening, pericholecystic inflammation or biliary dilatation. Pancreas: Unremarkable. No pancreatic ductal dilatation or surrounding inflammatory changes. Spleen: Normal in size without focal abnormality. Adrenals/Urinary Tract: Adrenal glands are unremarkable. Kidneys are normal in size, without obstructing renal calculi. A 4 mm renal calculus is seen within the  dependent portion of a mildly dilated left renal pelvis. A 5 mm diameter focus of fat attenuation (-20.46 Hounsfield units) is seen within the lateral aspect of the mid to upper right kidney. A mild amount of air is seen within the lumen of a moderately distended urinary bladder. Stomach/Bowel: Stomach is within normal limits. The appendix is not identified. A very large amount of stool is noted within the mid to distal sigmoid colon and rectum. No evidence of bowel wall thickening, distention, or  inflammatory changes. Noninflamed diverticula are seen within the proximal to mid sigmoid colon. Vascular/Lymphatic: Aortic atherosclerosis. No enlarged abdominal or pelvic lymph nodes. Reproductive: Status post hysterectomy. No adnexal masses. Other: No abdominal wall hernia or abnormality. No abdominopelvic ascites. Musculoskeletal: Marked severity multilevel degenerative changes are seen throughout the lumbar spine. IMPRESSION: 1. Mild lingular and posterior bibasilar atelectasis with a mild amount of superimposed posterior right lower lobe infiltrate suspected. 2. Cholelithiasis. 3. 4 mm nonobstructing renal calculus within the dependent portion of a mildly dilated left renal pelvis. 4. Sigmoid diverticulosis. 5. Very large amount of stool within the mid to distal sigmoid colon and rectum, consistent with fecal impaction. 6. Small right renal angiomyolipoma. 7. Aortic atherosclerosis. Aortic Atherosclerosis (ICD10-I70.0). Electronically Signed   By: Aram Candela M.D.   On: 02/03/2023 19:26   CT Angio Chest PE W and/or Wo Contrast  Result Date: 02/03/2023 CLINICAL DATA:  Cough and congestion with urinary frequency. EXAM: CT ANGIOGRAPHY CHEST WITH CONTRAST TECHNIQUE: Multidetector CT imaging of the chest was performed using the standard protocol during bolus administration of intravenous contrast. Multiplanar CT image reconstructions and MIPs were obtained to evaluate the vascular anatomy. RADIATION DOSE  REDUCTION: This exam was performed according to the departmental dose-optimization program which includes automated exposure control, adjustment of the mA and/or kV according to patient size and/or use of iterative reconstruction technique. CONTRAST:  OMNIPAQUE IOHEXOL 350 MG/ML SOLN COMPARISON:  None Available. FINDINGS: Cardiovascular: Mild calcification of the aortic arch is seen, without evidence of aortic aneurysm or dissection. Satisfactory opacification of the pulmonary arteries to the segmental level. No evidence of pulmonary embolism. Normal heart size with moderate severity coronary artery calcification. No pericardial effusion. Mediastinum/Nodes: No enlarged mediastinal, hilar, or axillary lymph nodes. The thyroid gland is mildly enlarged and heterogeneous in appearance. The trachea and esophagus demonstrate no significant findings. Lungs/Pleura: Mild anterior left apical, lingular and posterior bibasilar atelectatic changes are seen. Mild posterior right lower lobe infiltrate is also noted. There is no evidence of a pleural effusion or pneumothorax. Upper Abdomen: There is diffuse fatty infiltration of the liver parenchyma. Numerous tiny gallstones are seen within the dependent portion of a moderately distended gallbladder. Musculoskeletal: No chest wall abnormality. No acute or significant osseous findings. Review of the MIP images confirms the above findings. IMPRESSION: 1. No evidence of pulmonary embolism. 2. Mild posterior right lower lobe infiltrate. 3. Mild anterior left apical, lingular and posterior bibasilar atelectatic changes. 4. Cholelithiasis. 5. Hepatic steatosis. 6. Aortic atherosclerosis and coronary artery disease. Aortic Atherosclerosis (ICD10-I70.0). Electronically Signed   By: Aram Candela M.D.   On: 02/03/2023 19:20   DG Chest Port 1 View  Result Date: 02/03/2023 CLINICAL DATA:  Sepsis EXAM: PORTABLE CHEST 1 VIEW COMPARISON:  X-ray 02/04/2013 FINDINGS: There is some  linear opacity at the lung bases likely scar or atelectasis. Trace left pleural effusion. No pneumothorax or edema. Normal cardiopericardial silhouette. Overlapping cardiac leads. Degenerative changes of the right shoulder. IMPRESSION: Mild basilar scar or atelectasis. Question tiny left effusion versus pleural thickening. Electronically Signed   By: Karen Kays M.D.   On: 02/03/2023 17:33    EKG: I independently viewed the EKG done and my findings are as followed: Sinus tachycardia at a rate of 118 bpm  Assessment/Plan Present on Admission:  CAP (community acquired pneumonia)  Type 2 diabetes mellitus with diabetic neuropathy (HCC)  UTI (urinary tract infection)  Essential hypertension  MS (multiple sclerosis) (HCC)  Obesity (BMI 30-39.9)  Mixed hyperlipidemia  Principal Problem:  CAP (community acquired pneumonia) Active Problems:   MS (multiple sclerosis) (HCC)   Mixed hyperlipidemia   Obesity (BMI 30-39.9)   Essential hypertension   Type 2 diabetes mellitus with diabetic neuropathy (HCC)   UTI (urinary tract infection)   Personal history of transient ischemic attack (TIA), and cerebral infarction without residual deficits   Sepsis (HCC)  Sepsis secondary to community-acquired pneumonia CT angiography of chest was suggestive of right lower lobe infiltrate Patient met sepsis criteria due to leukocytosis and tachycardia with source of infection being pneumonia (and UTI) Patient was treated with metronidazole, vancomycin and cefepime, we shall continue vancomycin and cefepime at this time with plan to de-escalate/discontinue based on blood culture, sputum culture, urine Legionella, strep pneumo and procalcitonin Continue Tylenol, Cepacol and Robitussin as needed Continue Mucinex, incentive spirometry, flutter valve   UTI POA Continue IV cefepime Urine culture pending  Essential hypertension (uncontrolled) Continue losartan  Type 2 diabetes mellitus Continue ISS and  hypoglycemic protocol  Mixed hyperlipidemia Continue pravastatin  Multiple sclerosis Patient takes Glatiramer at home 3 times a week  Diabetic neuropathy Continue gabapentin  History of transient ischemic attack and cerebral infarct without residual deficits Continue pravastatin and Eliquis  Obesity (BMI 35.19) Diet and lifestyle modification   DVT prophylaxis: Eliquis  Advance Care Planning:   Code Status: Full Code   Consults: None  Family Communication: None at bedside  Severity of Illness: The appropriate patient status for this patient is INPATIENT. Inpatient status is judged to be reasonable and necessary in order to provide the required intensity of service to ensure the patient's safety. The patient's presenting symptoms, physical exam findings, and initial radiographic and laboratory data in the context of their chronic comorbidities is felt to place them at high risk for further clinical deterioration. Furthermore, it is not anticipated that the patient will be medically stable for discharge from the hospital within 2 midnights of admission.   * I certify that at the point of admission it is my clinical judgment that the patient will require inpatient hospital care spanning beyond 2 midnights from the point of admission due to high intensity of service, high risk for further deterioration and high frequency of surveillance required.*  Author: Frankey Shown, DO 02/03/2023 11:11 PM  For on call review [email or web address].

## 2023-02-03 NOTE — ED Notes (Signed)
Admission questions completed. Kellogg RN

## 2023-02-03 NOTE — ED Triage Notes (Signed)
[facility] reports pt coming from home for c/o cough and congestion as well as urinary frequency and odor. Home health nurse states she was having tachycardia as well.

## 2023-02-03 NOTE — ED Notes (Signed)
I&O cath attempted x2 with no success

## 2023-02-03 NOTE — ED Provider Notes (Signed)
[place] [facility] DEPARTMENT AT [facility] Provider Note   CSN: [id number] Arrival date & time: 02/03/23  1623     History  Chief Complaint  Patient presents with   Cough    Gabriela Rice is a 67 y.o. female.   Cough Associated symptoms: headaches and sore throat   Patient presents for cough, congestion, and urinary frequency.  Medical history includes chronic back pain, T2DM, IBS, MS, HLD, HTN, CVA, DVT.  She is on Eliquis.  She is wheelchair-bound at baseline.  Cough is been present for the past 3 days.  In addition to her urinary frequency, she has noticed a foul odor to her urine.  Patient currently endorses mild headache and sore throat.  She denies any other areas of discomfort.     Home Medications Prior to Admission medications   Medication Sig Start Date End Date Taking? Authorizing Provider  ALPRAZolam Prudy Feeler) 1 MG tablet Take 1 tablet (1 mg total) by mouth at bedtime as needed. 12/26/22   Glean Salvo, NP  apixaban (ELIQUIS) 5 MG TABS tablet TAKE 1 TABLET(5 MG) BY MOUTH TWICE DAILY 11/06/22   Sater, Pearletha Furl, MD  azaTHIOprine (IMURAN) 50 MG tablet TAKE 3 TABLETS(150 MG) BY MOUTH DAILY 05/24/22   Hilarie Fredrickson, MD  baclofen (LIORESAL) 10 MG tablet One tablet in the morning and evening and 1.5 tablets at midday 12/23/22   Glean Salvo, NP  cetirizine (ZYRTEC) 10 MG tablet Take 10 mg by mouth daily.    [provider]  Cholecalciferol (VITAMIN D) 2000 UNITS CAPS Take 2,000 Units by mouth daily.     [provider]  DULoxetine (CYMBALTA) 30 MG capsule Take 90 mg by mouth daily.  05/03/19   [provider]  gabapentin (NEURONTIN) 300 MG capsule TAKE 1 CAPSULE BY MOUTH THREE TIMES A DAY 12/26/22   Glean Salvo, NP  Glatiramer Acetate 40 MG/ML SOSY Use as directed by prescriber. Give 40mg  (1mL) as asubcutaneous injection three (3) times a week. PROTECT FROM LIGHT 10/08/22   Glean Salvo, NP  insulin lispro (HUMALOG) 100 UNIT/ML  KwikPen Inject into the skin as needed. 12/03/21   [provider]  losartan (COZAAR) 100 MG tablet Take 100 mg by mouth daily. 09/02/13   [provider]  metFORMIN (GLUCOPHAGE) 500 MG tablet Take 500 mg by mouth 2 (two) times daily with a meal.  03/15/13   [provider]  [facility] ULTRA test strip 2 (two) times daily as needed. 01/30/22   [provider]  polyethylene glycol powder (GLYCOLAX/MIRALAX) 17 GM/SCOOP powder Take 17 g by mouth daily as needed for mild constipation. 09/29/19   Layne Benton, NP  pravastatin (PRAVACHOL) 40 MG tablet Take 40 mg by mouth daily.  07/04/14   [provider]  traMADol (ULTRAM) 50 MG tablet TAKE 1 TABLET(50 MG) BY MOUTH THREE TIMES DAILY AS NEEDED 12/23/22   Sater, Pearletha Furl, MD  TRULICITY 0.75 MG/0.5ML SOPN Inject 0.75 mg into the skin once a week. 06/20/20   [provider]      Allergies    Penicillins, Crestor [rosuvastatin], Lipitor [atorvastatin], Lisinopril, Sulfamethoxazole-trimethoprim, Zocor [simvastatin], and Codeine    Review of Systems   Review of Systems  Constitutional:  Positive for fatigue.  HENT:  Positive for sore throat.   Respiratory:  Positive for cough.   Neurological:  Positive for headaches.  All other systems reviewed and are negative.   Physical Exam Updated Vital Signs  BP 121/70   Pulse (!) 114   Temp 98.4 F (36.9 C) (Oral)   Resp (!) 24   Wt 93 kg   SpO2 96%   BMI 35.19 kg/m  Physical Exam Vitals and nursing note reviewed.  Constitutional:      General: She is not in acute distress.    Appearance: Normal appearance. She is well-developed. She is not ill-appearing, toxic-appearing or diaphoretic.  HENT:     Head: Normocephalic and atraumatic.     Right Ear: External ear normal.     Left Ear: External ear normal.     Nose: Nose normal.     Mouth/Throat:     Mouth: Mucous membranes are moist.     Pharynx: Oropharynx is clear. No oropharyngeal exudate or  posterior oropharyngeal erythema.  Eyes:     Extraocular Movements: Extraocular movements intact.     Conjunctiva/sclera: Conjunctivae normal.  Cardiovascular:     Rate and Rhythm: Regular rhythm. Tachycardia present.     Heart sounds: No murmur heard. Pulmonary:     Effort: Pulmonary effort is normal. No respiratory distress.     Breath sounds: Normal breath sounds. No wheezing or rales.  Abdominal:     General: There is no distension.     Palpations: Abdomen is soft.     Tenderness: There is no abdominal tenderness.  Musculoskeletal:        General: No swelling. Normal range of motion.     Cervical back: Normal range of motion and neck supple.     Right lower leg: No edema.     Left lower leg: No edema.  Skin:    General: Skin is warm and dry.     Coloration: Skin is not jaundiced or pale.  Neurological:     General: No focal deficit present.     Mental Status: She is alert and oriented to person, place, and time.  Psychiatric:        Mood and Affect: Mood normal.        Behavior: Behavior normal.     ED Results / Procedures / Treatments   Labs (all labs ordered are listed, but only abnormal results are displayed) Labs Reviewed  COMPREHENSIVE METABOLIC PANEL - Abnormal; Notable for the following components:      Result Value   Sodium 133 (*)    Glucose, Bld 224 (*)    All other components within normal limits  CBC WITH DIFFERENTIAL/PLATELET - Abnormal; Notable for the following components:   WBC 12.9 (*)    Neutro Abs 10.1 (*)    Monocytes Absolute 1.2 (*)    All other components within normal limits  PROTIME-INR - Abnormal; Notable for the following components:   Prothrombin Time 16.2 (*)    INR 1.3 (*)    All other components within normal limits  URINALYSIS, W/ REFLEX TO CULTURE (INFECTION SUSPECTED) - Abnormal; Notable for the following components:   APPearance HAZY (*)    Glucose, UA >=500 (*)    Hgb urine dipstick SMALL (*)    Leukocytes,Ua LARGE (*)     Bacteria, UA RARE (*)    All other components within normal limits  RESP PANEL BY RT-PCR (RSV, FLU A&B, COVID)  RVPGX2  CULTURE, BLOOD (ROUTINE X 2)  CULTURE, BLOOD (ROUTINE X 2)  MRSA NEXT GEN BY PCR, NASAL  URINE CULTURE  EXPECTORATED SPUTUM ASSESSMENT W GRAM STAIN, RFLX TO RESP C  LACTIC ACID, PLASMA  LACTIC ACID, PLASMA  MAGNESIUM  HIV  ANTIBODY (ROUTINE TESTING W REFLEX)  COMPREHENSIVE METABOLIC PANEL  CBC  MAGNESIUM  PHOSPHORUS  LEGIONELLA PNEUMOPHILA SEROGP 1 UR AG  STREP PNEUMONIAE URINARY ANTIGEN  PROCALCITONIN    EKG None  Radiology CT ABDOMEN PELVIS W CONTRAST  Result Date: 02/03/2023 CLINICAL DATA:  Cough, congestion and urinary frequency. EXAM: CT ABDOMEN AND PELVIS WITH CONTRAST TECHNIQUE: Multidetector CT imaging of the abdomen and pelvis was performed using the standard protocol following bolus administration of intravenous contrast. RADIATION DOSE REDUCTION: This exam was performed according to the departmental dose-optimization program which includes automated exposure control, adjustment of the mA and/or kV according to patient size and/or use of iterative reconstruction technique. CONTRAST:  OMNIPAQUE IOHEXOL 350 MG/ML SOLN COMPARISON:  August 20, 2015. FINDINGS: Lower chest: Mild lingular and posterior bibasilar atelectasis is seen. A mild amount of superimposed posterior right lower lobe infiltrate is suspected. Hepatobiliary: There is diffuse fatty infiltration of the liver parenchyma. A tiny focus of parenchymal low attenuation is seen within the right lobe of the liver. Numerous tiny gallstones are seen within the dependent portion of a moderately distended gallbladder lumen. There is no evidence of gallbladder wall thickening, pericholecystic inflammation or biliary dilatation. Pancreas: Unremarkable. No pancreatic ductal dilatation or surrounding inflammatory changes. Spleen: Normal in size without focal abnormality. Adrenals/Urinary Tract: Adrenal glands  are unremarkable. Kidneys are normal in size, without obstructing renal calculi. A 4 mm renal calculus is seen within the dependent portion of a mildly dilated left renal pelvis. A 5 mm diameter focus of fat attenuation (-20.46 Hounsfield units) is seen within the lateral aspect of the mid to upper right kidney. A mild amount of air is seen within the lumen of a moderately distended urinary bladder. Stomach/Bowel: Stomach is within normal limits. The appendix is not identified. A very large amount of stool is noted within the mid to distal sigmoid colon and rectum. No evidence of bowel wall thickening, distention, or inflammatory changes. Noninflamed diverticula are seen within the proximal to mid sigmoid colon. Vascular/Lymphatic: Aortic atherosclerosis. No enlarged abdominal or pelvic lymph nodes. Reproductive: Status post hysterectomy. No adnexal masses. Other: No abdominal wall hernia or abnormality. No abdominopelvic ascites. Musculoskeletal: Marked severity multilevel degenerative changes are seen throughout the lumbar spine. IMPRESSION: 1. Mild lingular and posterior bibasilar atelectasis with a mild amount of superimposed posterior right lower lobe infiltrate suspected. 2. Cholelithiasis. 3. 4 mm nonobstructing renal calculus within the dependent portion of a mildly dilated left renal pelvis. 4. Sigmoid diverticulosis. 5. Very large amount of stool within the mid to distal sigmoid colon and rectum, consistent with fecal impaction. 6. Small right renal angiomyolipoma. 7. Aortic atherosclerosis. Aortic Atherosclerosis (ICD10-I70.0). Electronically Signed   By: Aram Candela M.D.   On: 02/03/2023 19:26   CT Angio Chest PE W and/or Wo Contrast  Result Date: 02/03/2023 CLINICAL DATA:  Cough and congestion with urinary frequency. EXAM: CT ANGIOGRAPHY CHEST WITH CONTRAST TECHNIQUE: Multidetector CT imaging of the chest was performed using the standard protocol during bolus administration of intravenous  contrast. Multiplanar CT image reconstructions and MIPs were obtained to evaluate the vascular anatomy. RADIATION DOSE REDUCTION: This exam was performed according to the departmental dose-optimization program which includes automated exposure control, adjustment of the mA and/or kV according to patient size and/or use of iterative reconstruction technique. CONTRAST:  OMNIPAQUE IOHEXOL 350 MG/ML SOLN COMPARISON:  None Available. FINDINGS: Cardiovascular: Mild calcification of the aortic arch is seen, without evidence of aortic aneurysm or dissection. Satisfactory opacification of the pulmonary  arteries to the segmental level. No evidence of pulmonary embolism. Normal heart size with moderate severity coronary artery calcification. No pericardial effusion. Mediastinum/Nodes: No enlarged mediastinal, hilar, or axillary lymph nodes. The thyroid gland is mildly enlarged and heterogeneous in appearance. The trachea and esophagus demonstrate no significant findings. Lungs/Pleura: Mild anterior left apical, lingular and posterior bibasilar atelectatic changes are seen. Mild posterior right lower lobe infiltrate is also noted. There is no evidence of a pleural effusion or pneumothorax. Upper Abdomen: There is diffuse fatty infiltration of the liver parenchyma. Numerous tiny gallstones are seen within the dependent portion of a moderately distended gallbladder. Musculoskeletal: No chest wall abnormality. No acute or significant osseous findings. Review of the MIP images confirms the above findings. IMPRESSION: 1. No evidence of pulmonary embolism. 2. Mild posterior right lower lobe infiltrate. 3. Mild anterior left apical, lingular and posterior bibasilar atelectatic changes. 4. Cholelithiasis. 5. Hepatic steatosis. 6. Aortic atherosclerosis and coronary artery disease. Aortic Atherosclerosis (ICD10-I70.0). Electronically Signed   By: Aram Candela M.D.   On: 02/03/2023 19:20   DG Chest Port 1 View  Result  Date: 02/03/2023 CLINICAL DATA:  Sepsis EXAM: PORTABLE CHEST 1 VIEW COMPARISON:  X-ray 02/04/2013 FINDINGS: There is some linear opacity at the lung bases likely scar or atelectasis. Trace left pleural effusion. No pneumothorax or edema. Normal cardiopericardial silhouette. Overlapping cardiac leads. Degenerative changes of the right shoulder. IMPRESSION: Mild basilar scar or atelectasis. Question tiny left effusion versus pleural thickening. Electronically Signed   By: Karen Kays M.D.   On: 02/03/2023 17:33    Procedures Procedures    Medications Ordered in ED Medications  lactated ringers infusion ( Intravenous New Bag/Given 02/03/23 1658)  ceFEPIme (MAXIPIME) 2 g in sodium chloride 0.9 % 100 mL IVPB (has no administration in time range)  vancomycin (VANCOREADY) IVPB 1250 mg/250 mL (has no administration in time range)  lactated ringers bolus 1,000 mL (has no administration in time range)  enoxaparin (LOVENOX) injection 40 mg (has no administration in time range)  acetaminophen (TYLENOL) tablet 650 mg (has no administration in time range)    Or  acetaminophen (TYLENOL) suppository 650 mg (has no administration in time range)  ondansetron (ZOFRAN) tablet 4 mg (has no administration in time range)    Or  ondansetron (ZOFRAN) injection 4 mg (has no administration in time range)  lactated ringers bolus 1,000 mL (0 mLs Intravenous Stopped 02/03/23 1829)  ceFEPIme (MAXIPIME) 2 g in sodium chloride 0.9 % 100 mL IVPB (0 g Intravenous Stopped 02/03/23 1727)  metroNIDAZOLE (FLAGYL) IVPB 500 mg (0 mg Intravenous Stopped 02/03/23 1829)  vancomycin (VANCOREADY) IVPB 2000 mg/400 mL (0 mg Intravenous Stopped 02/03/23 2049)  iohexol (OMNIPAQUE) 350 MG/ML injection 100 mL (100 mLs Intravenous Contrast Given 02/03/23 1907)    ED Course/ Medical Decision Making/ A&P                             Medical Decision Making Amount and/or Complexity of Data Reviewed Labs: ordered. Radiology: ordered. ECG/medicine  tests: ordered.  Risk Prescription drug management. Decision regarding hospitalization.   This patient presents to the ED for concern of cough, this involves an extensive number of treatment options, and is a complaint that carries with it a high risk of complications and morbidity.  The differential diagnosis includes pneumonia, reactive airway disease, URI, CHF   Co morbidities that complicate the patient evaluation  chronic back pain, T2DM, IBS, MS, HLD, HTN, CVA, DVT  Additional history obtained:  Additional history obtained from N/A External records from outside source obtained and reviewed including EMR   Lab Tests:  I Ordered, and personally interpreted labs.  The pertinent results include: Leukocytosis is present.  Patient has mild hyperglycemia.  Electrolytes are normal.  Urinalysis is consistent with UTI.   Imaging Studies ordered:  I ordered imaging studies including chest x-ray, CTA chest, CT of abdomen and pelvis I independently visualized and interpreted imaging which showed mild posterior right lower lobe infiltrate on CTA, large amount of stool in distal sigmoid colon and rectum, no other acute findings I agree with the radiologist interpretation   Cardiac Monitoring: / EKG:  The patient was maintained on a cardiac monitor.  I personally viewed and interpreted the cardiac monitored which showed an underlying rhythm of: Sinus rhythm   Problem List / ED Course / Critical interventions / Medication management  Patient presents for cough and chest congestion for the past 3 days in addition to urinary frequency.  Vital signs on arrival are notable for tachycardia.  SpO2 is slightly hypoxic on room air at 89%.  Patient is warm to the touch.  Despite this, she is well-appearing, alert, and oriented.  Breathing is currently unlabored.  Abdomen is soft and without tenderness.  She has no focal neurologic deficits.  Despite sore throat, no erythema or exudates are  present.  Septic workup and treatment were initiated.  Lab work is notable for leukocytosis and evidence of UTI on urinalysis.  Patient also has evidence of posterior right lower lobe pneumonia on CTA of chest.  CT of abdomen pelvis showed large amount of stool in distal sigmoid colon and rectum.  When questioned about this, patient states that she has not had a bowel movement in the past 3 to 4 days.  I attempted fecal disimpaction but there was no firm stool in rectal vault.  Patient underwent trial of Fleet enema as well.  Following initial bolus of IV fluids, patient remained tachycardic.  SpO2 is now normal on room air.  She was admitted for further management. I ordered medication including IV fluids and broad-spectrum antibiotics for treatment of sepsis; Fleet enema for constipation Reevaluation of the patient after these medicines showed that the patient improved I have reviewed the patients home medicines and have made adjustments as needed   Social Determinants of Health:  Nonambulatory at baseline  CRITICAL CARE Performed by: Gloris Manchester   Total critical care time: 35 minutes  Critical care time was exclusive of separately billable procedures and treating other patients.  Critical care was necessary to treat or prevent imminent or life-threatening deterioration.  Critical care was time spent personally by me on the following activities: development of treatment plan with patient and/or surrogate as well as nursing, discussions with consultants, evaluation of patient's response to treatment, examination of patient, obtaining history from patient or surrogate, ordering and performing treatments and interventions, ordering and review of laboratory studies, ordering and review of radiographic studies, pulse oximetry and re-evaluation of patient's condition.         Final Clinical Impression(s) / ED Diagnoses Final diagnoses:  Pneumonia of right lower lobe due to infectious  organism  Urinary tract infection with hematuria, site unspecified  Sepsis without acute organ dysfunction, due to unspecified organism [facility])  Constipation, unspecified constipation type    Rx / DC Orders ED Discharge Orders     None         Gloris Manchester, MD 02/03/23 2146

## 2023-02-03 NOTE — Progress Notes (Signed)
Pharmacy Antibiotic Note  Gabriela Rice is a 67 y.o. female admitted on 02/03/2023 with concern for pneumonia and UTI 2/2 to cough/congestion and increased urinary frequency.  Pharmacy has been consulted for Vancomycin/cefepime dosing.  Scr 0.54 mg/dL (utilized 0.8 mg/dL) Weight 93 kg Lactic acid 1.2  Plan: Vancomycin 2000mg  LD x1 followed by vancomycin 1250mg  q24hr (eAUC 498) Cefepime 2gm q8hr MRSA nares ordered Plan to obtain levels at steady state if therapy continued  Will monitor for acute changes in renal function and adjust as needed F/u cultures results and de-escalate as appropriate  Weight: 93 kg (205 lb)  Temp (24hrs), Avg:99.8 F (37.7 C), Min:99.8 F (37.7 C), Max:99.8 F (37.7 C)  Recent Labs  Lab 02/03/23 1637  WBC 12.9*  CREATININE 0.54  LATICACIDVEN 1.2    Estimated Creatinine Clearance: 76.4 mL/min (by C-G formula based on SCr of 0.54 mg/dL).    Allergies  Allergen Reactions   Penicillins Other (See Comments)    seizure as child Has patient had a PCN reaction causing immediate rash, facial/tongue/throat swelling, SOB or lightheadedness with hypotension: No Has patient had a PCN reaction causing severe rash involving mucus membranes or skin necrosis:No Has patient had a PCN reaction that required hospitalizationNo Has patient had a PCN reaction occurring within the last 10 years:No If all of the above answers are "NO", then may proceed with Cephalosporin use.     Crestor [Rosuvastatin]     Other reaction(s): Elevated LFT's   Lipitor [Atorvastatin]     Other reaction(s): muscle pain   Lisinopril     Other reaction(s): cough   Sulfamethoxazole-Trimethoprim     Other reaction(s): Dizziness, Loss of feeling in tongue   Zocor [Simvastatin]     Other reaction(s): muscle pain   Codeine Swelling    hands swell    Antimicrobials this admission: Vancomycin 5/6 >> p Cefepime  5/6 >> p  Dose adjustments this admission:  Microbiology results: 5/6  BCx: IP 5/6 MRSA PCR: IP  Thank you for allowing pharmacy to be a part of this patient's care.  Lewis Shock Andriel Omalley 02/03/2023 5:33 PM

## 2023-02-04 DIAGNOSIS — J189 Pneumonia, unspecified organism: Secondary | ICD-10-CM | POA: Diagnosis not present

## 2023-02-04 LAB — COMPREHENSIVE METABOLIC PANEL
ALT: 16 U/L (ref 0–44)
AST: 16 U/L (ref 15–41)
Albumin: 3.4 g/dL — ABNORMAL LOW (ref 3.5–5.0)
Alkaline Phosphatase: 47 U/L (ref 38–126)
Anion gap: 7 (ref 5–15)
BUN: 8 mg/dL (ref 8–23)
CO2: 24 mmol/L (ref 22–32)
Calcium: 9 mg/dL (ref 8.9–10.3)
Chloride: 104 mmol/L (ref 98–111)
Creatinine, Ser: 0.42 mg/dL — ABNORMAL LOW (ref 0.44–1.00)
GFR, Estimated: >60 mL/min (ref >60)
Glucose, Bld: 184 mg/dL — ABNORMAL HIGH (ref 70–99)
Potassium: 3.6 mmol/L (ref 3.5–5.1)
Sodium: 135 mmol/L (ref 135–145)
Total Bilirubin: 0.9 mg/dL (ref 0.3–1.2)
Total Protein: 6.7 g/dL (ref 6.5–8.1)

## 2023-02-04 LAB — GLUCOSE, CAPILLARY
Glucose-Capillary: 154 mg/dL — ABNORMAL HIGH (ref 70–99)
Glucose-Capillary: 174 mg/dL — ABNORMAL HIGH (ref 70–99)
Glucose-Capillary: 135 mg/dL — ABNORMAL HIGH (ref 70–99)
Glucose-Capillary: 190 mg/dL — ABNORMAL HIGH (ref 70–99)
Glucose-Capillary: 143 mg/dL — ABNORMAL HIGH (ref 70–99)

## 2023-02-04 LAB — CULTURE, BLOOD (ROUTINE X 2): Special Requests: ADEQUATE

## 2023-02-04 LAB — CBC
HCT: 38.4 % (ref 36.0–46.0)
Hemoglobin: 13 g/dL (ref 12.0–15.0)
MCH: 30.7 pg (ref 26.0–34.0)
MCHC: 33.9 g/dL (ref 30.0–36.0)
MCV: 90.8 fL (ref 80.0–100.0)
Platelets: 192 10*3/uL (ref 150–400)
RBC: 4.23 MIL/uL (ref 3.87–5.11)
RDW: 14.8 % (ref 11.5–15.5)
WBC: 9.2 10*3/uL (ref 4.0–10.5)
nRBC: 0 % (ref 0.0–0.2)

## 2023-02-04 LAB — PROCALCITONIN: Procalcitonin: 0.24 ng/mL

## 2023-02-04 LAB — URINE CULTURE: Culture: <10000 — AB

## 2023-02-04 LAB — HIV ANTIBODY (ROUTINE TESTING W REFLEX): HIV Screen 4th Generation wRfx: NONREACTIVE

## 2023-02-04 LAB — PHOSPHORUS: Phosphorus: 3 mg/dL (ref 2.5–4.6)

## 2023-02-04 LAB — MAGNESIUM: Magnesium: 1.9 mg/dL (ref 1.7–2.4)

## 2023-02-04 LAB — HEMOGLOBIN A1C
Hgb A1c MFr Bld: 7 % — ABNORMAL HIGH (ref 4.8–5.6)
Mean Plasma Glucose: 154.2 mg/dL

## 2023-02-04 LAB — STREP PNEUMONIAE URINARY ANTIGEN: Strep Pneumo Urinary Antigen: NEGATIVE

## 2023-02-04 MED ORDER — BACLOFEN 10 MG PO TABS
10.0000 mg | ORAL_TABLET | Freq: Two times a day (BID) | ORAL | Status: DC
  Administered 2023-02-04 – 2023-02-06 (×4): 10 mg via ORAL
  Filled 2023-02-04 (×4): qty 1

## 2023-02-04 MED ORDER — BACLOFEN 10 MG PO TABS
15.0000 mg | ORAL_TABLET | Freq: Every day | ORAL | Status: DC
  Administered 2023-02-04 – 2023-02-05 (×2): 15 mg via ORAL
  Filled 2023-02-04 (×2): qty 2

## 2023-02-04 MED ORDER — ALPRAZOLAM 1 MG PO TABS: 1.0000 mg | ORAL_TABLET | Freq: Every evening | ORAL | Status: DC | PRN

## 2023-02-04 MED ORDER — TRAMADOL HCL 50 MG PO TABS
50.0000 mg | ORAL_TABLET | Freq: Two times a day (BID) | ORAL | Status: DC
  Administered 2023-02-04 – 2023-02-06 (×5): 50 mg via ORAL
  Filled 2023-02-04 (×5): qty 1

## 2023-02-04 MED ORDER — DULOXETINE HCL 60 MG PO CPEP
90.0000 mg | ORAL_CAPSULE | Freq: Every day | ORAL | Status: DC
  Administered 2023-02-04 – 2023-02-06 (×3): 90 mg via ORAL
  Filled 2023-02-04 (×3): qty 1

## 2023-02-04 NOTE — Progress Notes (Signed)
  Transition of Care [facility]) Screening Note   Patient Details  Name: GALENA BRIGGS Date of Birth: [birth year]   Transition of Care [facility]) CM/SW Contact:    Villa Herb, LCSWA Phone Number: 02/04/2023, 10:12 AM    Transition of Care Department [facility]) has reviewed patient and no TOC needs have been identified at this time. We will continue to monitor patient advancement through interdisciplinary progression rounds. If new patient transition needs arise, please place a TOC consult.

## 2023-02-04 NOTE — Progress Notes (Signed)
PROGRESS NOTE    Gabriela Rice  ZOX:[id number] DOB: 1955-11-04 DOA: 02/03/2023 PCP: Mila Palmer, MD   Brief Narrative:    Gabriela Rice is a 67 y.o. female with medical history significant of hypertension, hyperlipidemia, type 2 diabetes mellitus, multiple sclerosis presents to the emergency department via EMS from home due to 3-day onset of sore throat, nonproductive cough and chest congestion.  She also complained of 2 to 3 weeks of intermittent burning sensation with urination and increased urinary frequency.  Patient was admitted with sepsis secondary to community-acquired pneumonia as well as UTI and was started on vancomycin and cefepime.  She is usually bedbound and uses wheelchair at baseline.  Assessment & Plan:   Principal Problem:   CAP (community acquired pneumonia) Active Problems:   MS (multiple sclerosis) (HCC)   Mixed hyperlipidemia   Obesity (BMI 30-39.9)   Essential hypertension   Type 2 diabetes mellitus with diabetic neuropathy (HCC)   UTI (urinary tract infection)   Personal history of transient ischemic attack (TIA), and cerebral infarction without residual deficits   Sepsis (HCC)  Assessment and Plan:   Sepsis secondary to community-acquired pneumonia and UTI CT angiography of chest was suggestive of right lower lobe infiltrate Patient met sepsis criteria due to leukocytosis and tachycardia with source of infection being pneumonia (and UTI) Patient was treated with metronidazole, vancomycin and cefepime, we shall continue vancomycin and cefepime at this time with plan to de-escalate/discontinue based on blood culture, sputum culture, urine Legionella, strep pneumo and procalcitonin Continue Tylenol, Cepacol and Robitussin as needed Continue Mucinex, incentive spirometry, flutter valve    UTI POA Continue IV cefepime Urine culture pending Blood cultures with no growth noted   Essential hypertension (uncontrolled) Continue losartan   Type 2  diabetes mellitus Continue ISS and hypoglycemic protocol   Mixed hyperlipidemia Continue pravastatin   Multiple sclerosis Patient takes Glatiramer at home 3 times a week   Diabetic neuropathy Continue gabapentin   History of transient ischemic attack and cerebral infarct without residual deficits Continue pravastatin and Eliquis   Obesity (BMI 35.19) Diet and lifestyle modification    DVT prophylaxis:Eliquis Code Status: Full Family Communication: None at bedside Disposition Plan:  Status is: Inpatient Remains inpatient appropriate because: Need for IV medications.   Consultants:  None  Procedures:  None  Antimicrobials:  Anti-infectives (From admission, onward)    Start     Dose/Rate Route Frequency Ordered Stop   02/04/23 1800  vancomycin (VANCOREADY) IVPB 1250 mg/250 mL        1,250 mg 166.7 mL/hr over 90 Minutes Intravenous Every 24 hours 02/03/23 1740     02/04/23 0200  ceFEPIme (MAXIPIME) 2 g in sodium chloride 0.9 % 100 mL IVPB        2 g 200 mL/hr over 30 Minutes Intravenous Every 8 hours 02/03/23 1740     02/03/23 1730  vancomycin (VANCOREADY) IVPB 2000 mg/400 mL        2,000 mg 200 mL/hr over 120 Minutes Intravenous  Once 02/03/23 1655 02/03/23 2049   02/03/23 1700  ceFEPIme (MAXIPIME) 2 g in sodium chloride 0.9 % 100 mL IVPB        2 g 200 mL/hr over 30 Minutes Intravenous  Once 02/03/23 1646 02/03/23 1727   02/03/23 1700  metroNIDAZOLE (FLAGYL) IVPB 500 mg        500 mg 100 mL/hr over 60 Minutes Intravenous  Once 02/03/23 1646 02/03/23 1829   02/03/23 1700  vancomycin (VANCOCIN) IVPB 1000 mg/200  mL premix  Status:  Discontinued        1,000 mg 200 mL/hr over 60 Minutes Intravenous  Once 02/03/23 1646 02/03/23 1655       Subjective: Patient seen and evaluated today with no new acute complaints or concerns. No acute concerns or events noted overnight.  She is feeling somewhat nauseous this morning.  Objective: Vitals:   02/03/23 2230 02/03/23  2359 02/04/23 0515 02/04/23 0739  BP: (!) 159/88 (!) 147/70 127/72 (!) 169/99  Pulse:  (!) 110  (!) 106  Resp: (!) 24 18 20 20   Temp:  99 F (37.2 C) 99.1 F (37.3 C) 98.3 F (36.8 C)  TempSrc:  Oral  Oral  SpO2:  95% 95% 94%  Weight:  84.7 kg    Height:  5\' 4"  (1.626 m)      Intake/Output Summary (Last 24 hours) at 02/04/2023 1239 Last data filed at 02/04/2023 1610 Gross per 24 hour  Intake 2956.96 ml  Output 2000 ml  Net 956.96 ml   Filed Weights   02/03/23 1629 02/03/23 2359  Weight: 93 kg 84.7 kg    Examination:  General exam: Appears calm and comfortable  Respiratory system: Clear to auscultation. Respiratory effort normal. Cardiovascular system: S1 & S2 heard, RRR.  Gastrointestinal system: Abdomen is soft Central nervous system: Alert and awake Extremities: No edema Skin: No significant lesions noted Psychiatry: Flat affect.    Data Reviewed: I have personally reviewed following labs and imaging studies  CBC: Recent Labs  Lab 02/03/23 1637 02/04/23 0456  WBC 12.9* 9.2  NEUTROABS 10.1*  --   HGB 14.1 13.0  HCT 41.3 38.4  MCV 89.8 90.8  PLT 242 192   Basic Metabolic Panel: Recent Labs  Lab 02/03/23 1637 02/04/23 0456  NA 133* 135  K 3.7 3.6  CL 99 104  CO2 24 24  GLUCOSE 224* 184*  BUN 12 8  CREATININE 0.54 0.42*  CALCIUM 9.0 9.0  MG 2.0 1.9  PHOS  --  3.0   GFR: Estimated Creatinine Clearance: 72.8 mL/min (A) (by C-G formula based on SCr of 0.42 mg/dL (L)). Liver Function Tests: Recent Labs  Lab 02/03/23 1637 02/04/23 0456  AST 23 16  ALT 20 16  ALKPHOS 55 47  BILITOT 0.8 0.9  PROT 7.7 6.7  ALBUMIN 3.8 3.4*   No results for input(s): "LIPASE", "AMYLASE" in the last 168 hours. No results for input(s): "AMMONIA" in the last 168 hours. Coagulation Profile: Recent Labs  Lab 02/03/23 1637  INR 1.3*   Cardiac Enzymes: No results for input(s): "CKTOTAL", "CKMB", "CKMBINDEX", "TROPONINI" in the last 168 hours. BNP (last 3  results) No results for input(s): "PROBNP" in the last 8760 hours. HbA1C: Recent Labs    02/03/23 1637  HGBA1C 7.0*   CBG: Recent Labs  Lab 02/04/23 0723 02/04/23 1058  GLUCAP 154* 174*   Lipid Profile: No results for input(s): "CHOL", "HDL", "LDLCALC", "TRIG", "CHOLHDL", "LDLDIRECT" in the last 72 hours. Thyroid Function Tests: No results for input(s): "TSH", "T4TOTAL", "FREET4", "T3FREE", "THYROIDAB" in the last 72 hours. Anemia Panel: No results for input(s): "VITAMINB12", "FOLATE", "FERRITIN", "TIBC", "IRON", "RETICCTPCT" in the last 72 hours. Sepsis Labs: Recent Labs  Lab 02/03/23 1637 02/03/23 1821 02/04/23 0456  PROCALCITON  --   --  0.24  LATICACIDVEN 1.2 1.4  --     Recent Results (from the past 240 hour(s))  Blood Culture (routine x 2)     Status: None (Preliminary result)   Collection  Time: 02/03/23  4:31 PM   Specimen: BLOOD  Result Value Ref Range Status   Specimen Description BLOOD BLOOD RIGHT ARM  Final   Special Requests   Final    BOTTLES DRAWN AEROBIC AND ANAEROBIC Blood Culture results may not be optimal due to an excessive volume of blood received in culture bottles   Culture   Final    NO GROWTH < 24 HOURS Performed at [facility], [address] [place], [place] [address]    Report Status PENDING  Incomplete  Blood Culture (routine x 2)     Status: None (Preliminary result)   Collection Time: 02/03/23  5:29 PM   Specimen: BLOOD RIGHT HAND  Result Value Ref Range Status   Specimen Description BLOOD RIGHT HAND AEROBIC BOTTLE ONLY  Final   Special Requests Blood Culture adequate volume  Final   Culture   Final    NO GROWTH < 24 HOURS Performed at [facility], [address] [place], [place] [address]    Report Status PENDING  Incomplete  Resp panel by RT-PCR (RSV, Flu A&B, Covid) Anterior Nasal Swab     Status: None   Collection Time: 02/03/23  5:45 PM   Specimen: Anterior Nasal Swab  Result Value Ref Range Status   SARS Coronavirus 2 by  RT PCR NEGATIVE NEGATIVE Final    Comment: (NOTE) SARS-CoV-2 target nucleic acids are NOT DETECTED.  The SARS-CoV-2 RNA is generally detectable in upper respiratory specimens during the acute phase of infection. The lowest concentration of SARS-CoV-2 viral copies this assay can detect is 138 copies/mL. A negative result does not preclude SARS-Cov-2 infection and should not be used as the sole basis for treatment or other patient management decisions. A negative result may occur with  improper specimen collection/handling, submission of specimen other than nasopharyngeal swab, presence of viral mutation(s) within the areas targeted by this assay, and inadequate number of viral copies(<138 copies/mL). A negative result must be combined with clinical observations, patient history, and epidemiological information. The expected result is Negative.  Fact Sheet for Patients:  BloggerCourse.com  Fact Sheet for Healthcare Providers:  SeriousBroker.it  This test is no t yet approved or cleared by the [place] FDA and  has been authorized for detection and/or diagnosis of SARS-CoV-2 by FDA under an Emergency Use Authorization (EUA). This EUA will remain  in effect (meaning this test can be used) for the duration of the COVID-19 declaration under Section 564(b)(1) of the Act, 21 U.S.C.section 360bbb-3(b)(1), unless the authorization is terminated  or revoked sooner.       Influenza A by PCR NEGATIVE NEGATIVE Final   Influenza B by PCR NEGATIVE NEGATIVE Final    Comment: (NOTE) The Xpert Xpress SARS-CoV-2/FLU/RSV plus assay is intended as an aid in the diagnosis of influenza from Nasopharyngeal swab specimens and should not be used as a sole basis for treatment. Nasal washings and aspirates are unacceptable for Xpert Xpress SARS-CoV-2/FLU/RSV testing.  Fact Sheet for Patients: BloggerCourse.com  Fact Sheet  for Healthcare Providers: SeriousBroker.it  This test is not yet approved or cleared by the [place] FDA and has been authorized for detection and/or diagnosis of SARS-CoV-2 by FDA under an Emergency Use Authorization (EUA). This EUA will remain in effect (meaning this test can be used) for the duration of the COVID-19 declaration under Section 564(b)(1) of the Act, 21 U.S.C. section 360bbb-3(b)(1), unless the authorization is terminated or revoked.     Resp Syncytial Virus by PCR NEGATIVE NEGATIVE  Final    Comment: (NOTE) Fact Sheet for Patients: BloggerCourse.com  Fact Sheet for Healthcare Providers: SeriousBroker.it  This test is not yet approved or cleared by the [place] FDA and has been authorized for detection and/or diagnosis of SARS-CoV-2 by FDA under an Emergency Use Authorization (EUA). This EUA will remain in effect (meaning this test can be used) for the duration of the COVID-19 declaration under Section 564(b)(1) of the Act, 21 U.S.C. section 360bbb-3(b)(1), unless the authorization is terminated or revoked.  Performed at [facility], [address] [place], [place] [address]   Urine Culture     Status: None (Preliminary result)   Collection Time: 02/03/23  6:15 PM   Specimen: Urine, Clean Catch  Result Value Ref Range Status   Specimen Description   Final    URINE, CLEAN CATCH Performed at [facility], [address] [place], [place] [address]    Special Requests   Final    NONE Reflexed from [id number] Performed at [facility], [address], [place], [place] [address]    Culture PENDING  Incomplete   Report Status PENDING  Incomplete         Radiology Studies: CT ABDOMEN PELVIS W CONTRAST  Result Date: 02/03/2023 CLINICAL DATA:  Cough, congestion and urinary frequency. EXAM: CT ABDOMEN AND PELVIS WITH CONTRAST TECHNIQUE: Multidetector CT imaging of the abdomen  and pelvis was performed using the standard protocol following bolus administration of intravenous contrast. RADIATION DOSE REDUCTION: This exam was performed according to the departmental dose-optimization program which includes automated exposure control, adjustment of the mA and/or kV according to patient size and/or use of iterative reconstruction technique. CONTRAST:  OMNIPAQUE IOHEXOL 350 MG/ML SOLN COMPARISON:  August 20, 2015. FINDINGS: Lower chest: Mild lingular and posterior bibasilar atelectasis is seen. A mild amount of superimposed posterior right lower lobe infiltrate is suspected. Hepatobiliary: There is diffuse fatty infiltration of the liver parenchyma. A tiny focus of parenchymal low attenuation is seen within the right lobe of the liver. Numerous tiny gallstones are seen within the dependent portion of a moderately distended gallbladder lumen. There is no evidence of gallbladder wall thickening, pericholecystic inflammation or biliary dilatation. Pancreas: Unremarkable. No pancreatic ductal dilatation or surrounding inflammatory changes. Spleen: Normal in size without focal abnormality. Adrenals/Urinary Tract: Adrenal glands are unremarkable. Kidneys are normal in size, without obstructing renal calculi. A 4 mm renal calculus is seen within the dependent portion of a mildly dilated left renal pelvis. A 5 mm diameter focus of fat attenuation (-20.46 Hounsfield units) is seen within the lateral aspect of the mid to upper right kidney. A mild amount of air is seen within the lumen of a moderately distended urinary bladder. Stomach/Bowel: Stomach is within normal limits. The appendix is not identified. A very large amount of stool is noted within the mid to distal sigmoid colon and rectum. No evidence of bowel wall thickening, distention, or inflammatory changes. Noninflamed diverticula are seen within the proximal to mid sigmoid colon. Vascular/Lymphatic: Aortic atherosclerosis. No enlarged  abdominal or pelvic lymph nodes. Reproductive: Status post hysterectomy. No adnexal masses. Other: No abdominal wall hernia or abnormality. No abdominopelvic ascites. Musculoskeletal: Marked severity multilevel degenerative changes are seen throughout the lumbar spine. IMPRESSION: 1. Mild lingular and posterior bibasilar atelectasis with a mild amount of superimposed posterior right lower lobe infiltrate suspected. 2. Cholelithiasis. 3. 4 mm nonobstructing renal calculus within the dependent portion of a mildly dilated left renal pelvis. 4. Sigmoid diverticulosis. 5. Very large  amount of stool within the mid to distal sigmoid colon and rectum, consistent with fecal impaction. 6. Small right renal angiomyolipoma. 7. Aortic atherosclerosis. Aortic Atherosclerosis (ICD10-I70.0). Electronically Signed   By: Aram Candela M.D.   On: 02/03/2023 19:26   CT Angio Chest PE W and/or Wo Contrast  Result Date: 02/03/2023 CLINICAL DATA:  Cough and congestion with urinary frequency. EXAM: CT ANGIOGRAPHY CHEST WITH CONTRAST TECHNIQUE: Multidetector CT imaging of the chest was performed using the standard protocol during bolus administration of intravenous contrast. Multiplanar CT image reconstructions and MIPs were obtained to evaluate the vascular anatomy. RADIATION DOSE REDUCTION: This exam was performed according to the departmental dose-optimization program which includes automated exposure control, adjustment of the mA and/or kV according to patient size and/or use of iterative reconstruction technique. CONTRAST:  OMNIPAQUE IOHEXOL 350 MG/ML SOLN COMPARISON:  None Available. FINDINGS: Cardiovascular: Mild calcification of the aortic arch is seen, without evidence of aortic aneurysm or dissection. Satisfactory opacification of the pulmonary arteries to the segmental level. No evidence of pulmonary embolism. Normal heart size with moderate severity coronary artery calcification. No pericardial effusion.  Mediastinum/Nodes: No enlarged mediastinal, hilar, or axillary lymph nodes. The thyroid gland is mildly enlarged and heterogeneous in appearance. The trachea and esophagus demonstrate no significant findings. Lungs/Pleura: Mild anterior left apical, lingular and posterior bibasilar atelectatic changes are seen. Mild posterior right lower lobe infiltrate is also noted. There is no evidence of a pleural effusion or pneumothorax. Upper Abdomen: There is diffuse fatty infiltration of the liver parenchyma. Numerous tiny gallstones are seen within the dependent portion of a moderately distended gallbladder. Musculoskeletal: No chest wall abnormality. No acute or significant osseous findings. Review of the MIP images confirms the above findings. IMPRESSION: 1. No evidence of pulmonary embolism. 2. Mild posterior right lower lobe infiltrate. 3. Mild anterior left apical, lingular and posterior bibasilar atelectatic changes. 4. Cholelithiasis. 5. Hepatic steatosis. 6. Aortic atherosclerosis and coronary artery disease. Aortic Atherosclerosis (ICD10-I70.0). Electronically Signed   By: Aram Candela M.D.   On: 02/03/2023 19:20   DG Chest Port 1 View  Result Date: 02/03/2023 CLINICAL DATA:  Sepsis EXAM: PORTABLE CHEST 1 VIEW COMPARISON:  X-ray 02/04/2013 FINDINGS: There is some linear opacity at the lung bases likely scar or atelectasis. Trace left pleural effusion. No pneumothorax or edema. Normal cardiopericardial silhouette. Overlapping cardiac leads. Degenerative changes of the right shoulder. IMPRESSION: Mild basilar scar or atelectasis. Question tiny left effusion versus pleural thickening. Electronically Signed   By: Karen Kays M.D.   On: 02/03/2023 17:33        Scheduled Meds:  apixaban  5 mg Oral BID   baclofen  10 mg Oral BID   baclofen  15 mg Oral Q1400   dextromethorphan-guaiFENesin  1 tablet Oral BID   DULoxetine  90 mg Oral Daily   gabapentin  300 mg Oral TID   insulin aspart  0-15 Units  Subcutaneous TID WC   losartan  100 mg Oral Daily   pravastatin  40 mg Oral Daily   traMADol  50 mg Oral Q12H   Continuous Infusions:  ceFEPime (MAXIPIME) IV 2 g (02/04/23 1013)   lactated ringers Stopped (02/03/23 2332)   vancomycin       LOS: 1 day    Time spent: 35 minutes    Anastacia Reinecke Hoover Brunette, DO [facility]  If 7PM-7AM, please contact night-coverage [email or web address] 02/04/2023, 12:39 PM

## 2023-02-05 DIAGNOSIS — J189 Pneumonia, unspecified organism: Secondary | ICD-10-CM | POA: Diagnosis not present

## 2023-02-05 LAB — BLOOD CULTURE ID PANEL (REFLEXED) - BCID2

## 2023-02-05 LAB — CBC
HCT: 37.1 % (ref 36.0–46.0)
Hemoglobin: 12.5 g/dL (ref 12.0–15.0)
MCH: 30.9 pg (ref 26.0–34.0)
MCHC: 33.7 g/dL (ref 30.0–36.0)
MCV: 91.6 fL (ref 80.0–100.0)
Platelets: 191 10*3/uL (ref 150–400)
RBC: 4.05 MIL/uL (ref 3.87–5.11)
RDW: 15.2 % (ref 11.5–15.5)
WBC: 7.8 10*3/uL (ref 4.0–10.5)
nRBC: 0 % (ref 0.0–0.2)

## 2023-02-05 LAB — CULTURE, BLOOD (ROUTINE X 2)

## 2023-02-05 LAB — MAGNESIUM: Magnesium: 1.9 mg/dL (ref 1.7–2.4)

## 2023-02-05 LAB — BASIC METABOLIC PANEL
Anion gap: 10 (ref 5–15)
BUN: 11 mg/dL (ref 8–23)
CO2: 25 mmol/L (ref 22–32)
Calcium: 8.9 mg/dL (ref 8.9–10.3)
Chloride: 99 mmol/L (ref 98–111)
Creatinine, Ser: 0.57 mg/dL (ref 0.44–1.00)
GFR, Estimated: >60 mL/min (ref >60)
Glucose, Bld: 148 mg/dL — ABNORMAL HIGH (ref 70–99)
Potassium: 3.7 mmol/L (ref 3.5–5.1)
Sodium: 134 mmol/L — ABNORMAL LOW (ref 135–145)

## 2023-02-05 LAB — GLUCOSE, CAPILLARY
Glucose-Capillary: 130 mg/dL — ABNORMAL HIGH (ref 70–99)
Glucose-Capillary: 146 mg/dL — ABNORMAL HIGH (ref 70–99)
Glucose-Capillary: 176 mg/dL — ABNORMAL HIGH (ref 70–99)
Glucose-Capillary: 137 mg/dL — ABNORMAL HIGH (ref 70–99)

## 2023-02-05 LAB — LEGIONELLA PNEUMOPHILA SEROGP 1 UR AG: L. pneumophila Serogp 1 Ur Ag: NEGATIVE

## 2023-02-05 LAB — MRSA NEXT GEN BY PCR, NASAL: MRSA by PCR Next Gen: NOT DETECTED

## 2023-02-05 NOTE — Progress Notes (Signed)
[facility] PROGRESS NOTE  Gabriela Rice (DOB: 29-Oct-1955) ZOX:[id number] PCP: Mila Palmer, MD  Brief Narrative: Gabriela Rice is a 67 y.o. female with a history of MS, T2DM, HTN, HLD who presented to the ED on 02/03/2023 with cough, chest congestion admitted for sepsis due to RLL pneumonia improving on antibiotics.   Subjective: Breathing improved, no fevers, feels better. Denies current or recent urinary symptoms. Has had UTIs in the past and did not suspect she had one this admission. Eating ok.   Objective: BP (!) 140/88 (BP Location: Right Arm)   Pulse (!) 104   Temp 98.4 F (36.9 C) (Oral)   Resp 18   Ht 5\' 4"  (1.626 m)   Wt 84.7 kg   SpO2 97%   BMI 32.05 kg/m   Gen: Pleasant 66yo F in no distress Pulm: Clear, nonlabored  CV: RRR, no MRG GI: Soft, NT, ND, +BS  Neuro: Alert and oriented. No new focal deficits. Ext: Warm, no deformities. Skin: Right anterolateral lower leg with hyperkeratotic lesion on erythematous base ~1.5cm rounded. No induration, tenderness, fluctuance.   Assessment & Plan: Sepsis secondary to RLL CAP: Leukocytosis and tachycardia have resolved with antibiotics.  - Continue cefepime pending culture results. MRSA PCR negative, DC vancomycin.  - Sputum culture sent, no result. - Blood cultures with 1 of 3 bottles with Staph spp NOS (non-aureus, epidermidis, lugdunensis). Will continue monitoring, though suspect this is contaminant.   - Continue Tylenol, Cepacol and Robitussin as needed - Continue Mucinex, incentive spirometry, flutter valve    Right leg lesion: Has been present and stable for 2 years.  - Suggest dermatology evaluation/biopsy, could be SCC  UTI: Ruled out. Urine culture insignificant growth.    HTN:  - Continue losartan   Type 2 diabetes mellitus Continue ISS and hypoglycemic protocol  Multiple sclerosis: Bedbound at baseline, plan to return home, at her baseline. - Continue glatiramer at home TIW   Diabetic  neuropathy: - Continue gabapentin   History of TIA, HLD:  - Continue eliquis, statin   Obesity: Body mass index is 32.05 kg/m.   Tyrone Nine, MD [facility] [email or web address] 02/05/2023, 6:13 PM

## 2023-02-06 DIAGNOSIS — J189 Pneumonia, unspecified organism: Secondary | ICD-10-CM | POA: Diagnosis not present

## 2023-02-06 LAB — GLUCOSE, CAPILLARY: Glucose-Capillary: 148 mg/dL — ABNORMAL HIGH (ref 70–99)

## 2023-02-06 MED ORDER — CEFDINIR 300 MG PO CAPS: 300.0000 mg | ORAL_CAPSULE | Freq: Two times a day (BID) | ORAL | 0 refills | Status: AC

## 2023-02-06 NOTE — Discharge Summary (Signed)
Physician Discharge Summary   Patient: Gabriela Rice MRN: [id number] DOB: 01-Nov-1955  Admit date:     02/03/2023  Discharge date: 02/06/23  Discharge Physician: Tyrone Nine   PCP: Mila Palmer, MD   Recommendations at discharge:  Follow up with PCP in 1-2 weeks.  Recommend dermatology follow up/biopsy of right lower leg chronic lesion.  Discharge Diagnoses: Principal Problem:   CAP (community acquired pneumonia) Active Problems:   MS (multiple sclerosis) (HCC)   Mixed hyperlipidemia   Obesity (BMI 30-39.9)   Essential hypertension   Type 2 diabetes mellitus with diabetic neuropathy (HCC)   UTI (urinary tract infection)   Personal history of transient ischemic attack (TIA), and cerebral infarction without residual deficits   Sepsis [facility])  Hospital Course: Gabriela Rice is a 67 y.o. female with a history of MS, T2DM, HTN, HLD who presented to the ED on 02/03/2023 with cough, chest congestion admitted for sepsis due to RLL pneumonia improving on antibiotics.   Assessment and Plan: Sepsis secondary to RLL CAP: Leukocytosis and tachycardia have resolved with antibiotics.  - Continued cefepime pending culture results. Pt has allergies listed to, among others, bactrim and PCN. Has tolerated multiple cephalosporins in the past, so will be discharged to complete a course with omnicef. Plan 10 day course given her immunosuppressant use. MRSA PCR negative, DC'ed vancomycin.  - Sputum culture sent, no result. - Blood cultures with 1 of 3 bottles with Staph spp NOS (non-aureus, epidermidis, lugdunensis). No further growth on other cultures, this will be treated as contaminant.    - Continue incentive spirometry, flutter valve    Right leg lesion: Has been present and stable for 2 years.  - Suggest dermatology evaluation/biopsy, could be SCC   UTI: Ruled out. Urine culture insignificant growth.    HTN:  - Continue losartan   T2DM: Continue home Tx.   Multiple sclerosis:  Bedbound at baseline, plan to return home, at her baseline. - Continue glatiramer at home TIW, azathioprine.    Diabetic neuropathy: - Continue gabapentin   History of TIA, HLD:  - Continue eliquis, pravastatin (intolerant to several others) and zetia.    Obesity: Body mass index is 32.05 kg/m.   Consultants: None Procedures performed: None  Disposition: Home Diet recommendation:  Cardiac and Carb modified diet DISCHARGE MEDICATION: Allergies as of 02/06/2023       Reactions   Penicillins Other (See Comments)   seizure as child Has patient had a PCN reaction causing immediate rash, facial/tongue/throat swelling, SOB or lightheadedness with hypotension: No Has patient had a PCN reaction causing severe rash involving mucus membranes or skin necrosis:No Has patient had a PCN reaction that required hospitalizationNo Has patient had a PCN reaction occurring within the last 10 years:No If all of the above answers are "NO", then may proceed with Cephalosporin use.   Crestor [rosuvastatin]    Other reaction(s): Elevated LFT's   Lipitor [atorvastatin]    Other reaction(s): muscle pain   Lisinopril    Other reaction(s): cough   Sulfamethoxazole-trimethoprim    Other reaction(s): Dizziness, Loss of feeling in tongue   Zocor [simvastatin]    Other reaction(s): muscle pain   Codeine Swelling   hands swell        Medication List     TAKE these medications    ALPRAZolam 1 MG tablet Commonly known as: XANAX Take 1 tablet (1 mg total) by mouth at bedtime as needed. What changed:  how much to take reasons to take this  apixaban 5 MG Tabs tablet Commonly known as: Eliquis TAKE 1 TABLET(5 MG) BY MOUTH TWICE DAILY What changed:  how much to take how to take this when to take this additional instructions   azaTHIOprine 50 MG tablet Commonly known as: IMURAN TAKE 3 TABLETS(150 MG) BY MOUTH DAILY What changed: See the new instructions.   baclofen 10 MG tablet Commonly  known as: LIORESAL One tablet in the morning and evening and 1.5 tablets at midday What changed:  how much to take how to take this when to take this   cefdinir 300 MG capsule Commonly known as: OMNICEF Take 1 capsule (300 mg total) by mouth 2 (two) times daily for 7 days.   cetirizine 10 MG tablet Commonly known as: ZYRTEC Take 10 mg by mouth daily.   DULoxetine 30 MG capsule Commonly known as: CYMBALTA Take 90 mg by mouth daily.   ezetimibe 10 MG tablet Commonly known as: ZETIA Take 10 mg by mouth daily.   gabapentin 300 MG capsule Commonly known as: NEURONTIN TAKE 1 CAPSULE BY MOUTH THREE TIMES A DAY What changed:  how much to take how to take this when to take this additional instructions   Glatiramer Acetate 40 MG/ML Sosy Use as directed by prescriber. Give 40mg  (1mL) as asubcutaneous injection three (3) times a week. PROTECT FROM LIGHT   insulin lispro 100 UNIT/ML KwikPen Commonly known as: HUMALOG Inject 2-8 Units into the skin as needed. Sliding units   losartan 100 MG tablet Commonly known as: COZAAR Take 100 mg by mouth daily.   metFORMIN 500 MG tablet Commonly known as: GLUCOPHAGE Take 500 mg by mouth 2 (two) times daily with a meal.   OneTouch Ultra test strip Generic drug: glucose blood 2 (two) times daily as needed.   polyethylene glycol powder 17 GM/SCOOP powder Commonly known as: GLYCOLAX/MIRALAX Take 17 g by mouth daily as needed for mild constipation.   pravastatin 40 MG tablet Commonly known as: PRAVACHOL Take 40 mg by mouth daily.   traMADol 50 MG tablet Commonly known as: ULTRAM TAKE 1 TABLET(50 MG) BY MOUTH THREE TIMES DAILY AS NEEDED What changed: See the new instructions.   Trulicity 0.75 MG/0.5ML Sopn Generic drug: Dulaglutide Inject 0.75 mg into the skin once a week. Monday   Vitamin D 50 MCG (2000 UT) Caps Take 2,000 Units by mouth daily.        Follow-up Information     Mila Palmer, MD Follow up.   Specialty:  Family Medicine Contact information: [address] [place] [address] [phone]                Discharge Exam: Ceasar Mons Weights   02/03/23 1629 02/03/23 2359  Weight: 93 kg 84.7 kg  BP 131/85 (BP Location: Right Arm)   Pulse 95   Temp 98.2 F (36.8 C) (Oral)   Resp 18   Ht 5\' 4"  (1.626 m)   Wt 84.7 kg   SpO2 99%   BMI 32.05 kg/m   Well-appearing female in no distress eating breakfast. Clear, nonlabored on room air RRR, no MRG or pitting edema Soft, NT, ND, +BS No new deficits.   Condition at discharge: stable  The results of significant diagnostics from this hospitalization (including imaging, microbiology, ancillary and laboratory) are listed below for reference.   Imaging Studies: CT ABDOMEN PELVIS W CONTRAST  Result Date: 02/03/2023 CLINICAL DATA:  Cough, congestion and urinary frequency. EXAM: CT ABDOMEN AND PELVIS WITH CONTRAST TECHNIQUE: Multidetector CT  imaging of the abdomen and pelvis was performed using the standard protocol following bolus administration of intravenous contrast. RADIATION DOSE REDUCTION: This exam was performed according to the departmental dose-optimization program which includes automated exposure control, adjustment of the mA and/or kV according to patient size and/or use of iterative reconstruction technique. CONTRAST:  OMNIPAQUE IOHEXOL 350 MG/ML SOLN COMPARISON:  August 20, 2015. FINDINGS: Lower chest: Mild lingular and posterior bibasilar atelectasis is seen. A mild amount of superimposed posterior right lower lobe infiltrate is suspected. Hepatobiliary: There is diffuse fatty infiltration of the liver parenchyma. A tiny focus of parenchymal low attenuation is seen within the right lobe of the liver. Numerous tiny gallstones are seen within the dependent portion of a moderately distended gallbladder lumen. There is no evidence of gallbladder wall thickening, pericholecystic inflammation or biliary dilatation.  Pancreas: Unremarkable. No pancreatic ductal dilatation or surrounding inflammatory changes. Spleen: Normal in size without focal abnormality. Adrenals/Urinary Tract: Adrenal glands are unremarkable. Kidneys are normal in size, without obstructing renal calculi. A 4 mm renal calculus is seen within the dependent portion of a mildly dilated left renal pelvis. A 5 mm diameter focus of fat attenuation (-20.46 Hounsfield units) is seen within the lateral aspect of the mid to upper right kidney. A mild amount of air is seen within the lumen of a moderately distended urinary bladder. Stomach/Bowel: Stomach is within normal limits. The appendix is not identified. A very large amount of stool is noted within the mid to distal sigmoid colon and rectum. No evidence of bowel wall thickening, distention, or inflammatory changes. Noninflamed diverticula are seen within the proximal to mid sigmoid colon. Vascular/Lymphatic: Aortic atherosclerosis. No enlarged abdominal or pelvic lymph nodes. Reproductive: Status post hysterectomy. No adnexal masses. Other: No abdominal wall hernia or abnormality. No abdominopelvic ascites. Musculoskeletal: Marked severity multilevel degenerative changes are seen throughout the lumbar spine. IMPRESSION: 1. Mild lingular and posterior bibasilar atelectasis with a mild amount of superimposed posterior right lower lobe infiltrate suspected. 2. Cholelithiasis. 3. 4 mm nonobstructing renal calculus within the dependent portion of a mildly dilated left renal pelvis. 4. Sigmoid diverticulosis. 5. Very large amount of stool within the mid to distal sigmoid colon and rectum, consistent with fecal impaction. 6. Small right renal angiomyolipoma. 7. Aortic atherosclerosis. Aortic Atherosclerosis (ICD10-I70.0). Electronically Signed   By: Aram Candela M.D.   On: 02/03/2023 19:26   CT Angio Chest PE W and/or Wo Contrast  Result Date: 02/03/2023 CLINICAL DATA:  Cough and congestion with urinary  frequency. EXAM: CT ANGIOGRAPHY CHEST WITH CONTRAST TECHNIQUE: Multidetector CT imaging of the chest was performed using the standard protocol during bolus administration of intravenous contrast. Multiplanar CT image reconstructions and MIPs were obtained to evaluate the vascular anatomy. RADIATION DOSE REDUCTION: This exam was performed according to the departmental dose-optimization program which includes automated exposure control, adjustment of the mA and/or kV according to patient size and/or use of iterative reconstruction technique. CONTRAST:  OMNIPAQUE IOHEXOL 350 MG/ML SOLN COMPARISON:  None Available. FINDINGS: Cardiovascular: Mild calcification of the aortic arch is seen, without evidence of aortic aneurysm or dissection. Satisfactory opacification of the pulmonary arteries to the segmental level. No evidence of pulmonary embolism. Normal heart size with moderate severity coronary artery calcification. No pericardial effusion. Mediastinum/Nodes: No enlarged mediastinal, hilar, or axillary lymph nodes. The thyroid gland is mildly enlarged and heterogeneous in appearance. The trachea and esophagus demonstrate no significant findings. Lungs/Pleura: Mild anterior left apical, lingular and posterior bibasilar atelectatic changes are seen. Mild  posterior right lower lobe infiltrate is also noted. There is no evidence of a pleural effusion or pneumothorax. Upper Abdomen: There is diffuse fatty infiltration of the liver parenchyma. Numerous tiny gallstones are seen within the dependent portion of a moderately distended gallbladder. Musculoskeletal: No chest wall abnormality. No acute or significant osseous findings. Review of the MIP images confirms the above findings. IMPRESSION: 1. No evidence of pulmonary embolism. 2. Mild posterior right lower lobe infiltrate. 3. Mild anterior left apical, lingular and posterior bibasilar atelectatic changes. 4. Cholelithiasis. 5. Hepatic steatosis. 6. Aortic  atherosclerosis and coronary artery disease. Aortic Atherosclerosis (ICD10-I70.0). Electronically Signed   By: Aram Candela M.D.   On: 02/03/2023 19:20   DG Chest Port 1 View  Result Date: 02/03/2023 CLINICAL DATA:  Sepsis EXAM: PORTABLE CHEST 1 VIEW COMPARISON:  X-ray 02/04/2013 FINDINGS: There is some linear opacity at the lung bases likely scar or atelectasis. Trace left pleural effusion. No pneumothorax or edema. Normal cardiopericardial silhouette. Overlapping cardiac leads. Degenerative changes of the right shoulder. IMPRESSION: Mild basilar scar or atelectasis. Question tiny left effusion versus pleural thickening. Electronically Signed   By: Karen Kays M.D.   On: 02/03/2023 17:33    Microbiology: Results for orders placed or performed during the hospital encounter of 02/03/23  Blood Culture (routine x 2)     Status: None (Preliminary result)   Collection Time: 02/03/23  4:31 PM   Specimen: BLOOD  Result Value Ref Range Status   Specimen Description   Final    BLOOD BLOOD RIGHT ARM Performed at [facility], [address] [place], [place] [address]    Special Requests   Final    BOTTLES DRAWN AEROBIC AND ANAEROBIC Blood Culture results may not be optimal due to an excessive volume of blood received in culture bottles Performed at [facility], [address] [place], [place] [address]    Culture  Setup Time   Final    GRAM POSITIVE COCCI AEROBIC BOTTLE Gram Stain Report Called to,Read Back By and Verified With: POOLE,A ON 02/04/23 AT 2245 BY LOY,C CRITICAL RESULT CALLED TO, READ BACK BY AND VERIFIED WITH: J. MUSE RN 02/05/23 @ 0142 BY AB Performed at [facility], [address], [place], [place] [address]    Culture GRAM POSITIVE COCCI  Final   Report Status PENDING  Incomplete  Blood Culture ID Panel (Reflexed)     Status: Abnormal   Collection Time: 02/03/23  4:31 PM  Result Value Ref Range Status   Enterococcus faecalis NOT DETECTED NOT DETECTED Final    Enterococcus Faecium NOT DETECTED NOT DETECTED Final   Listeria monocytogenes NOT DETECTED NOT DETECTED Final   Staphylococcus species DETECTED (A) NOT DETECTED Final    Comment: CRITICAL RESULT CALLED TO, READ BACK BY AND VERIFIED WITH: J. MUSE RN 02/05/23 @ 0142 BY AB    Staphylococcus aureus (BCID) NOT DETECTED NOT DETECTED Final   Staphylococcus epidermidis NOT DETECTED NOT DETECTED Final   Staphylococcus lugdunensis NOT DETECTED NOT DETECTED Final   Streptococcus species NOT DETECTED NOT DETECTED Final   Streptococcus agalactiae NOT DETECTED NOT DETECTED Final   Streptococcus pneumoniae NOT DETECTED NOT DETECTED Final   Streptococcus pyogenes NOT DETECTED NOT DETECTED Final   A.calcoaceticus-baumannii NOT DETECTED NOT DETECTED Final   Bacteroides fragilis NOT DETECTED NOT DETECTED Final   Enterobacterales NOT DETECTED NOT DETECTED Final   Enterobacter cloacae complex NOT DETECTED NOT DETECTED Final   Escherichia coli NOT DETECTED NOT DETECTED Final   Klebsiella  aerogenes NOT DETECTED NOT DETECTED Final   Klebsiella oxytoca NOT DETECTED NOT DETECTED Final   Klebsiella pneumoniae NOT DETECTED NOT DETECTED Final   Proteus species NOT DETECTED NOT DETECTED Final   Salmonella species NOT DETECTED NOT DETECTED Final   Serratia marcescens NOT DETECTED NOT DETECTED Final   Haemophilus influenzae NOT DETECTED NOT DETECTED Final   Neisseria meningitidis NOT DETECTED NOT DETECTED Final   Pseudomonas aeruginosa NOT DETECTED NOT DETECTED Final   Stenotrophomonas maltophilia NOT DETECTED NOT DETECTED Final   Candida albicans NOT DETECTED NOT DETECTED Final   Candida auris NOT DETECTED NOT DETECTED Final   Candida glabrata NOT DETECTED NOT DETECTED Final   Candida krusei NOT DETECTED NOT DETECTED Final   Candida parapsilosis NOT DETECTED NOT DETECTED Final   Candida tropicalis NOT DETECTED NOT DETECTED Final   Cryptococcus neoformans/gattii NOT DETECTED NOT DETECTED Final    Comment: Performed  at [facility], [address] [place], [place] [address]  Blood Culture (routine x 2)     Status: None (Preliminary result)   Collection Time: 02/03/23  5:29 PM   Specimen: BLOOD RIGHT HAND  Result Value Ref Range Status   Specimen Description BLOOD RIGHT HAND AEROBIC BOTTLE ONLY  Final   Special Requests Blood Culture adequate volume  Final   Culture   Final    NO GROWTH 3 DAYS Performed at [facility], [address], [place], [place] [address]    Report Status PENDING  Incomplete  Resp panel by RT-PCR (RSV, Flu A&B, Covid) Anterior Nasal Swab     Status: None   Collection Time: 02/03/23  5:45 PM   Specimen: Anterior Nasal Swab  Result Value Ref Range Status   SARS Coronavirus 2 by RT PCR NEGATIVE NEGATIVE Final    Comment: (NOTE) SARS-CoV-2 target nucleic acids are NOT DETECTED.  The SARS-CoV-2 RNA is generally detectable in upper respiratory specimens during the acute phase of infection. The lowest concentration of SARS-CoV-2 viral copies this assay can detect is 138 copies/mL. A negative result does not preclude SARS-Cov-2 infection and should not be used as the sole basis for treatment or other patient management decisions. A negative result may occur with  improper specimen collection/handling, submission of specimen other than nasopharyngeal swab, presence of viral mutation(s) within the areas targeted by this assay, and inadequate number of viral copies(<138 copies/mL). A negative result must be combined with clinical observations, patient history, and epidemiological information. The expected result is Negative.  Fact Sheet for Patients:  BloggerCourse.com  Fact Sheet for Healthcare Providers:  SeriousBroker.it  This test is no t yet approved or cleared by the [place] FDA and  has been authorized for detection and/or diagnosis of SARS-CoV-2 by FDA under an Emergency Use Authorization (EUA). This EUA  will remain  in effect (meaning this test can be used) for the duration of the COVID-19 declaration under Section 564(b)(1) of the Act, 21 U.S.C.section 360bbb-3(b)(1), unless the authorization is terminated  or revoked sooner.       Influenza A by PCR NEGATIVE NEGATIVE Final   Influenza B by PCR NEGATIVE NEGATIVE Final    Comment: (NOTE) The Xpert Xpress SARS-CoV-2/FLU/RSV plus assay is intended as an aid in the diagnosis of influenza from Nasopharyngeal swab specimens and should not be used as a sole basis for treatment. Nasal washings and aspirates are unacceptable for Xpert Xpress SARS-CoV-2/FLU/RSV testing.  Fact Sheet for Patients: BloggerCourse.com  Fact Sheet for Healthcare Providers: SeriousBroker.it  This test is  not yet approved or cleared by the [place] and has been authorized for detection and/or diagnosis of SARS-CoV-2 by FDA under an Emergency Use Authorization (EUA). This EUA will remain in effect (meaning this test can be used) for the duration of the COVID-19 declaration under Section 564(b)(1) of the Act, 21 U.S.C. section 360bbb-3(b)(1), unless the authorization is terminated or revoked.     Resp Syncytial Virus by PCR NEGATIVE NEGATIVE Final    Comment: (NOTE) Fact Sheet for Patients: BloggerCourse.com  Fact Sheet for Healthcare Providers: SeriousBroker.it  This test is not yet approved or cleared by the [place] FDA and has been authorized for detection and/or diagnosis of SARS-CoV-2 by FDA under an Emergency Use Authorization (EUA). This EUA will remain in effect (meaning this test can be used) for the duration of the COVID-19 declaration under Section 564(b)(1) of the Act, 21 U.S.C. section 360bbb-3(b)(1), unless the authorization is terminated or revoked.  Performed at [facility], [address], [place], [place] [address]   Urine  Culture     Status: Abnormal   Collection Time: 02/03/23  6:15 PM   Specimen: Urine, Clean Catch  Result Value Ref Range Status   Specimen Description   Final    URINE, CLEAN CATCH Performed at [facility], [address] [place], [place] [address]    Special Requests   Final    NONE Reflexed from [phone] Performed at [facility], [address] [place], [place] [address]    Culture (A)  Final    <10,000 COLONIES/mL INSIGNIFICANT GROWTH Performed at [facility], [address] [place], [place] [address]    Report Status 02/04/2023 FINAL  Final  MRSA Next Gen by PCR, Nasal     Status: None   Collection Time: 02/05/23 11:45 AM   Specimen: Nasal Mucosa; Nasal Swab  Result Value Ref Range Status   MRSA by PCR Next Gen NOT DETECTED NOT DETECTED Final    Comment: (NOTE) The GeneXpert MRSA Assay (FDA approved for NASAL specimens only), is one component of a comprehensive MRSA colonization surveillance program. It is not intended to diagnose MRSA infection nor to guide or monitor treatment for MRSA infections. Test performance is not FDA approved in patients less than 76 years old. Performed at [facility], [address] [place], [place] [address]     Labs: CBC: Recent Labs  Lab 02/03/23 1637 02/04/23 0456 02/05/23 0550  WBC 12.9* 9.2 7.8  NEUTROABS 10.1*  --   --   HGB 14.1 13.0 12.5  HCT 41.3 38.4 37.1  MCV 89.8 90.8 91.6  PLT 242 192 191   Basic Metabolic Panel: Recent Labs  Lab 02/03/23 1637 02/04/23 0456 02/05/23 0550  NA 133* 135 134*  K 3.7 3.6 3.7  CL 99 104 99  CO2 24 24 25   GLUCOSE 224* 184* 148*  BUN 12 8 11   CREATININE 0.54 0.42* 0.57  CALCIUM 9.0 9.0 8.9  MG 2.0 1.9 1.9  PHOS  --  3.0  --    Liver Function Tests: Recent Labs  Lab 02/03/23 1637 02/04/23 0456  AST 23 16  ALT 20 16  ALKPHOS 55 47  BILITOT 0.8 0.9  PROT 7.7 6.7  ALBUMIN 3.8 3.4*   CBG: Recent Labs  Lab 02/05/23 0740 02/05/23 1109 02/05/23 1627  02/05/23 2206 02/06/23 0745  GLUCAP 130* 146* 176* 137* 148*    Discharge time spent: greater than 30 minutes.  Signed: Tyrone Nine, MD  [facility] 02/06/2023

## 2023-02-06 NOTE — Progress Notes (Signed)
Pt discharged via stretcher by [facility] to home.

## 2023-02-06 NOTE — Care Management Important Message (Signed)
Important Message  Patient Details  Name: Gabriela Rice MRN: [id number] Date of Birth: 01-30-1956   Medicare Important Message Given:  Yes     Corey Harold 02/06/2023, 10:47 AM

## 2023-02-06 NOTE — Plan of Care (Signed)

## 2023-02-07 LAB — CULTURE, BLOOD (ROUTINE X 2): Culture: NO GROWTH

## 2023-02-08 LAB — CULTURE, BLOOD (ROUTINE X 2)

## 2023-02-16 DIAGNOSIS — R110 Nausea: Secondary | ICD-10-CM | POA: Diagnosis not present

## 2023-02-20 DIAGNOSIS — R4182 Altered mental status, unspecified: Secondary | ICD-10-CM | POA: Diagnosis not present

## 2023-02-20 DIAGNOSIS — Z8619 Personal history of other infectious and parasitic diseases: Secondary | ICD-10-CM | POA: Diagnosis not present

## 2023-02-20 DIAGNOSIS — C801 Malignant (primary) neoplasm, unspecified: Secondary | ICD-10-CM | POA: Diagnosis not present

## 2023-02-20 DIAGNOSIS — G35 Multiple sclerosis: Secondary | ICD-10-CM | POA: Diagnosis not present

## 2023-02-20 DIAGNOSIS — I69351 Hemiplegia and hemiparesis following cerebral infarction affecting right dominant side: Secondary | ICD-10-CM | POA: Diagnosis not present

## 2023-02-20 DIAGNOSIS — N390 Urinary tract infection, site not specified: Secondary | ICD-10-CM | POA: Diagnosis not present

## 2023-02-20 DIAGNOSIS — I700 Atherosclerosis of aorta: Secondary | ICD-10-CM | POA: Diagnosis not present

## 2023-02-20 DIAGNOSIS — Z79631 Long term (current) use of antimetabolite agent: Secondary | ICD-10-CM | POA: Diagnosis not present

## 2023-02-20 DIAGNOSIS — J189 Pneumonia, unspecified organism: Secondary | ICD-10-CM | POA: Diagnosis not present

## 2023-02-20 DIAGNOSIS — E782 Mixed hyperlipidemia: Secondary | ICD-10-CM | POA: Diagnosis not present

## 2023-02-26 ENCOUNTER — Encounter: Payer: Self-pay | Admitting: Neurology

## 2023-02-26 DIAGNOSIS — E782 Mixed hyperlipidemia: Secondary | ICD-10-CM | POA: Diagnosis not present

## 2023-02-26 DIAGNOSIS — E1169 Type 2 diabetes mellitus with other specified complication: Secondary | ICD-10-CM | POA: Diagnosis not present

## 2023-02-26 DIAGNOSIS — F330 Major depressive disorder, recurrent, mild: Secondary | ICD-10-CM | POA: Diagnosis not present

## 2023-02-26 DIAGNOSIS — I10 Essential (primary) hypertension: Secondary | ICD-10-CM | POA: Diagnosis not present

## 2023-02-27 ENCOUNTER — Telehealth: Payer: Self-pay

## 2023-02-27 DIAGNOSIS — G35 Multiple sclerosis: Secondary | ICD-10-CM | POA: Diagnosis not present

## 2023-02-27 DIAGNOSIS — I639 Cerebral infarction, unspecified: Secondary | ICD-10-CM | POA: Diagnosis not present

## 2023-02-27 NOTE — Telephone Encounter (Signed)
Call to patient who states she saw her PCP for a hospital follow up last week. She had a UTI and pneumonia in the hospital. Her PCP gave another round of oral antibiotics last week.  She is also  having issues with headaches and confusion. She reports sister in law coming to check on her and call 911 and EMS evaluated in the home due to increased heart of 120 bmp with severe headache and Blood pressure 160/90. Patient states she is feeling better and doesn't need an appointment,  just wanted to inform us and that her PCP wanted [place] to be aware that patient had been having issues. I reviewed signs and symptoms of stroke as well and encouraged patient to call 911 if those symptoms presented. Advised patient is would let Maralyn Sago know and follow up if she had further advise or recommendations.Patient appreciative of call.

## 2023-03-25 DIAGNOSIS — L988 Other specified disorders of the skin and subcutaneous tissue: Secondary | ICD-10-CM | POA: Diagnosis not present

## 2023-03-30 DIAGNOSIS — G35 Multiple sclerosis: Secondary | ICD-10-CM | POA: Diagnosis not present

## 2023-03-30 DIAGNOSIS — I639 Cerebral infarction, unspecified: Secondary | ICD-10-CM | POA: Diagnosis not present

## 2023-04-01 DIAGNOSIS — L988 Other specified disorders of the skin and subcutaneous tissue: Secondary | ICD-10-CM | POA: Diagnosis not present

## 2023-04-10 ENCOUNTER — Other Ambulatory Visit: Payer: Self-pay | Admitting: Neurology

## 2023-04-10 NOTE — Telephone Encounter (Signed)
Requested Prescriptions   Pending Prescriptions Disp Refills   ALPRAZolam (XANAX) 1 MG tablet [Pharmacy Med Name: ALPRAZOLAM 1MG  TABLETS] 90 tablet     Sig: TAKE 1 TABLET(1 MG) BY MOUTH AT BEDTIME AS NEEDED   Last seen  12/26/22, next visit 07/08/23   Dispenses  Routing to provider to fill  Dispensed Days Supply Quantity Provider Pharmacy  ALPRAZOLAM 1MG  TABLETS 12/26/2022 90 90 each Glean Salvo, NP [facility]  ALPRAZOLAM 1MG  TABLETS 09/03/2022 90 90 each Glean Salvo, NP [facility]  ALPRAZOLAM 1MG  TABLETS 05/28/2022 90 90 each Glean Salvo, NP [facility].Marland KitchenMarland Kitchen

## 2023-04-16 DIAGNOSIS — L918 Other hypertrophic disorders of the skin: Secondary | ICD-10-CM | POA: Diagnosis not present

## 2023-04-16 DIAGNOSIS — C44712 Basal cell carcinoma of skin of right lower limb, including hip: Secondary | ICD-10-CM | POA: Diagnosis not present

## 2023-04-16 DIAGNOSIS — D1801 Hemangioma of skin and subcutaneous tissue: Secondary | ICD-10-CM | POA: Diagnosis not present

## 2023-04-20 DIAGNOSIS — L03115 Cellulitis of right lower limb: Secondary | ICD-10-CM | POA: Diagnosis not present

## 2023-04-20 DIAGNOSIS — K754 Autoimmune hepatitis: Secondary | ICD-10-CM | POA: Diagnosis not present

## 2023-04-20 DIAGNOSIS — Z8701 Personal history of pneumonia (recurrent): Secondary | ICD-10-CM | POA: Diagnosis not present

## 2023-04-20 DIAGNOSIS — Z8619 Personal history of other infectious and parasitic diseases: Secondary | ICD-10-CM | POA: Diagnosis not present

## 2023-04-20 DIAGNOSIS — Z87440 Personal history of urinary (tract) infections: Secondary | ICD-10-CM | POA: Diagnosis not present

## 2023-04-20 DIAGNOSIS — G35 Multiple sclerosis: Secondary | ICD-10-CM | POA: Diagnosis not present

## 2023-04-20 DIAGNOSIS — E119 Type 2 diabetes mellitus without complications: Secondary | ICD-10-CM | POA: Diagnosis not present

## 2023-04-28 DIAGNOSIS — L03115 Cellulitis of right lower limb: Secondary | ICD-10-CM | POA: Diagnosis not present

## 2023-04-29 DIAGNOSIS — G35 Multiple sclerosis: Secondary | ICD-10-CM | POA: Diagnosis not present

## 2023-04-29 DIAGNOSIS — I639 Cerebral infarction, unspecified: Secondary | ICD-10-CM | POA: Diagnosis not present

## 2023-05-07 ENCOUNTER — Other Ambulatory Visit: Payer: Self-pay | Admitting: Neurology

## 2023-05-07 DIAGNOSIS — G35D Multiple sclerosis, unspecified: Secondary | ICD-10-CM

## 2023-05-07 DIAGNOSIS — G35 Multiple sclerosis: Secondary | ICD-10-CM

## 2023-05-07 DIAGNOSIS — Z5181 Encounter for therapeutic drug level monitoring: Secondary | ICD-10-CM

## 2023-05-07 NOTE — Telephone Encounter (Signed)
Last seen on 12/26/22 Follow up scheduled on 07/08/23 Last filled on 12/23/22 #270 tablets (30 day supply) Rx pending to be signed

## 2023-05-16 ENCOUNTER — Encounter: Payer: Self-pay | Admitting: Neurology

## 2023-05-16 DIAGNOSIS — Z5181 Encounter for therapeutic drug level monitoring: Secondary | ICD-10-CM

## 2023-05-16 DIAGNOSIS — G35D Multiple sclerosis, unspecified: Secondary | ICD-10-CM

## 2023-05-16 DIAGNOSIS — G35 Multiple sclerosis: Secondary | ICD-10-CM

## 2023-05-17 ENCOUNTER — Other Ambulatory Visit: Payer: Self-pay | Admitting: Neurology

## 2023-05-20 ENCOUNTER — Telehealth: Payer: Self-pay

## 2023-05-20 MED ORDER — TRAMADOL HCL 50 MG PO TABS
50.0000 mg | ORAL_TABLET | Freq: Three times a day (TID) | ORAL | 0 refills | Status: DC | PRN
Start: 2023-05-20 — End: 2023-10-06

## 2023-05-20 NOTE — Telephone Encounter (Signed)
Last seen on 12/26/22 Follow up scheduled on 07/08/23

## 2023-05-20 NOTE — Telephone Encounter (Signed)
Mychart message sent.

## 2023-05-22 NOTE — Telephone Encounter (Signed)
Faxed tramadol appeal to [employer]

## 2023-05-23 ENCOUNTER — Other Ambulatory Visit: Payer: Self-pay | Admitting: Internal Medicine

## 2023-05-27 NOTE — Telephone Encounter (Signed)
Communication received from [facility] stating  that Tramadol 50 mg was approved from start date 05/20/2023 to end date 09/30/2023.

## 2023-05-29 DIAGNOSIS — Z08 Encounter for follow-up examination after completed treatment for malignant neoplasm: Secondary | ICD-10-CM | POA: Diagnosis not present

## 2023-05-29 DIAGNOSIS — Z85828 Personal history of other malignant neoplasm of skin: Secondary | ICD-10-CM | POA: Diagnosis not present

## 2023-05-30 DIAGNOSIS — G35 Multiple sclerosis: Secondary | ICD-10-CM | POA: Diagnosis not present

## 2023-05-30 DIAGNOSIS — I639 Cerebral infarction, unspecified: Secondary | ICD-10-CM | POA: Diagnosis not present

## 2023-06-03 ENCOUNTER — Other Ambulatory Visit (INDEPENDENT_AMBULATORY_CARE_PROVIDER_SITE_OTHER): Payer: PPO

## 2023-06-03 ENCOUNTER — Other Ambulatory Visit: Payer: Self-pay

## 2023-06-03 DIAGNOSIS — E782 Mixed hyperlipidemia: Secondary | ICD-10-CM | POA: Diagnosis not present

## 2023-06-03 DIAGNOSIS — K754 Autoimmune hepatitis: Secondary | ICD-10-CM

## 2023-06-03 DIAGNOSIS — R7989 Other specified abnormal findings of blood chemistry: Secondary | ICD-10-CM

## 2023-06-03 LAB — HEPATIC FUNCTION PANEL
ALT: 22 U/L (ref 0–35)
AST: 33 U/L (ref 0–37)
Albumin: 4.3 g/dL (ref 3.5–5.2)
Alkaline Phosphatase: 56 U/L (ref 39–117)
Bilirubin, Direct: 0.1 mg/dL (ref 0.0–0.3)
Total Bilirubin: 0.5 mg/dL (ref 0.2–1.2)
Total Protein: 7.6 g/dL (ref 6.0–8.3)

## 2023-06-03 LAB — LAB REPORT - SCANNED: EGFR: 105

## 2023-06-03 NOTE — Progress Notes (Signed)
Lab order and reminder in epic.  

## 2023-06-06 ENCOUNTER — Telehealth: Payer: Self-pay | Admitting: Internal Medicine

## 2023-06-06 NOTE — Telephone Encounter (Signed)
Returned call to patient. Patient denies any bleeding symptoms at this time including BRBPR, not black or tarry stools. I told pt that testing is not necessary unless she is having issues. Pt states that she just wanted to be sure. Pt had no concerns at the end of the call.

## 2023-06-06 NOTE — Telephone Encounter (Signed)
Inbound call from patient requesting a call to discuss if she is due for another fecal blood test. States when she previously had test it came back positive and would like to have another one. Please advise, thank you.

## 2023-06-30 DIAGNOSIS — I639 Cerebral infarction, unspecified: Secondary | ICD-10-CM | POA: Diagnosis not present

## 2023-06-30 DIAGNOSIS — G35 Multiple sclerosis: Secondary | ICD-10-CM | POA: Diagnosis not present

## 2023-07-07 ENCOUNTER — Telehealth: Payer: Self-pay | Admitting: Neurology

## 2023-07-07 NOTE — Telephone Encounter (Signed)
..   Pt understands that although there may be some limitations with this type of visit, we will take all precautions to reduce any security or privacy concerns.  Pt understands that this will be treated like an in office visit and we will file with pt's insurance, and there may be a patient responsible charge related to this service. ? ?

## 2023-07-08 ENCOUNTER — Telehealth (INDEPENDENT_AMBULATORY_CARE_PROVIDER_SITE_OTHER): Payer: PPO | Admitting: Neurology

## 2023-07-08 DIAGNOSIS — G35 Multiple sclerosis: Secondary | ICD-10-CM

## 2023-07-08 DIAGNOSIS — Z993 Dependence on wheelchair: Secondary | ICD-10-CM | POA: Diagnosis not present

## 2023-07-08 DIAGNOSIS — G35D Multiple sclerosis, unspecified: Secondary | ICD-10-CM

## 2023-07-08 DIAGNOSIS — I63411 Cerebral infarction due to embolism of right middle cerebral artery: Secondary | ICD-10-CM | POA: Diagnosis not present

## 2023-07-08 DIAGNOSIS — I69959 Hemiplegia and hemiparesis following unspecified cerebrovascular disease affecting unspecified side: Secondary | ICD-10-CM

## 2023-07-08 DIAGNOSIS — G894 Chronic pain syndrome: Secondary | ICD-10-CM | POA: Diagnosis not present

## 2023-07-08 DIAGNOSIS — R29898 Other symptoms and signs involving the musculoskeletal system: Secondary | ICD-10-CM | POA: Diagnosis not present

## 2023-07-08 NOTE — Progress Notes (Signed)
Virtual Visit via Video Note  I connected with Gabriela Rice on 07/08/23 at  2:15 PM EDT by a video enabled telemedicine application and verified that I am speaking with the correct person using two identifiers.  Location: Patient: at her home Provider: in the office    I discussed the limitations of evaluation and management by telemedicine and the availability of in person appointments. The patient expressed understanding and agreed to proceed.  History of Present Illness: Update July 08, 2023 SS: Via VV, in May hospitalized for PNA, sepsis, UTI, is recovered. Remains on Copaxone. Feels MS stable. Got her new electric wheelchair. Her Imuran has been reduced from 150 mg to 100 mg. Her LFTs were normal in Sept. Has urinary incontinence with adult briefs. Remains on chronic medication from [place] Tramadol BID PRN for back, leg pain. She wonders about stopping Copaxone.  Is nonambulatory, in wheelchair, has Nurse, adult.  Update 12/26/22 SS: Here with husband. Remains on Copaxone. Remains on Xanax at bedtime, on Eliquis, baclofen, gabapentin, tramadol. Feels MS is stable. Still waiting for new electric wheelchair. Remains wheelchair bound, has hoyer lift. Hepatitis is doing fine, remains on Imuran, lowered the dose. Has noted some vision changes, eye doctor appointment coming up.  No other changes.    HISTORY  06/25/22 Dr. Epimenio Foot: She is a 67 yo woman who was diagnosed with MS in 1998 afer presenting with slurred speech, weakness and poor gait.   She saw Dr. Anne Hahn initially and was placed on Betaseron.  Her LFTs increased and she discontinued.   She took nothing for several years and then started Copaxone around 2000 or 2001.   At that time, she was walking fairly well.   She began to have more difficulty with her gait and needed a cane by 2010 and a walker a few years later.    After she broke her ankles in August  2020 she became unable to walk and has been wheelchair bound since.   She had a  stroke December 2020 and had more lest arm weakness and slurred speech.    Her strength improved but not to baseline.   The embolic right parietal stroke was felt to be due to PFO and DVT in leg.   Since the stroke, she has been on Eliquis   She reports her PCP is working to get her a power wheelchair..  She reports having a physical therapy appointment soon   Currently, she is wheelchair bound and does not take steps.    Both legs  are weak, left worse than rigtht.  Can not bear weight.   She has some spasticity in legs.   She has just mild weakness in the left arm.  She has numbness in her legs.   She has urinary incontinence.   She cannot get to a toilet so needs to use Depends.    Her vision is ok.   She has never had ON.   She has fatigue.  She sleeps well most nights.   She notes mild reduced short term memory.   She has no trouble coming up with the right word.    She has some depression and is sometimes irritable.      She has autoimmune hepatitis and is on azathioprine   She is on gabapentin and trmadol for pain.   Observations/Objective: Via video visit, sitting upright in the chair, very conversant, alert and oriented, is nonambulatory, well dressed, speech is clear  Assessment and Plan:  1.  Multiple sclerosis secondary progressive 2.  Paraparesis 3.  Neurogenic bowel and bladder 4.  Autoimmune hepatitis 5.  History of stroke in 2020, right brain  -We discussed discontinuing Copaxone in the future.  Unclear benefit currently for her progressive aspects of MS . She has not had routine MRIs due to the difficulty of maintaining the proper positioning and her non-ambulatory status.  For now, she has decided to remain on Copaxone, we will revisit next year. -She is pleased with her new electric wheelchair -We will continue chronic medications including: Xanax, baclofen, Eliquis, gabapentin, tramadol.  Follow Up Instructions: 1 year with Dr. Epimenio Foot    I discussed the assessment and  treatment plan with the patient. The patient was provided an opportunity to ask questions and all were answered. The patient agreed with the plan and demonstrated an understanding of the instructions.   The patient was advised to call back or seek an in-person evaluation if the symptoms worsen or if the condition fails to improve as anticipated.   Otila Kluver, DNP  [facility] [address], [address] [place], [place] [address] [phone]

## 2023-07-30 DIAGNOSIS — G35 Multiple sclerosis: Secondary | ICD-10-CM | POA: Diagnosis not present

## 2023-07-30 DIAGNOSIS — I639 Cerebral infarction, unspecified: Secondary | ICD-10-CM | POA: Diagnosis not present

## 2023-08-06 ENCOUNTER — Other Ambulatory Visit: Payer: Self-pay | Admitting: Neurology

## 2023-08-07 ENCOUNTER — Other Ambulatory Visit: Payer: Self-pay | Admitting: Neurology

## 2023-08-17 ENCOUNTER — Other Ambulatory Visit: Payer: Self-pay | Admitting: Neurology

## 2023-08-18 NOTE — Telephone Encounter (Signed)
Last seen on 07/08/23 Follow up scheduled on 07/08/24

## 2023-08-30 DIAGNOSIS — G35 Multiple sclerosis: Secondary | ICD-10-CM | POA: Diagnosis not present

## 2023-08-30 DIAGNOSIS — I639 Cerebral infarction, unspecified: Secondary | ICD-10-CM | POA: Diagnosis not present

## 2023-09-03 ENCOUNTER — Telehealth: Payer: Self-pay

## 2023-09-03 NOTE — Telephone Encounter (Signed)
Received a fax requesting clinical notes and labs to be faxed to [facility].  I did not do a PA due to form only asked for clinical notes and labs.      Faxed clinicals, MRI and Labs to [phone].

## 2023-09-08 ENCOUNTER — Encounter: Payer: Self-pay | Admitting: Neurology

## 2023-09-11 ENCOUNTER — Other Ambulatory Visit (HOSPITAL_COMMUNITY): Payer: Self-pay

## 2023-09-11 ENCOUNTER — Telehealth: Payer: Self-pay

## 2023-09-11 NOTE — Telephone Encounter (Signed)
   I will try to do PA closer to end date of 09/30/2023

## 2023-09-12 NOTE — Telephone Encounter (Signed)
Pharmacy Patient Advocate Encounter   Received notification from CoverMyMeds that prior authorization for Glatopa 40MG /ML syringes is required/requested.   Insurance verification completed.   The patient is insured through [facility] ADVANTAGE/RX ADVANCE .   Per test claim: PA required; PA submitted to above mentioned insurance via CoverMyMeds Key/confirmation #/EOC [id number] Status is pending

## 2023-09-15 NOTE — Telephone Encounter (Signed)
Pharmacy Patient Advocate Encounter  Received notification from [facility] ADVANTAGE/RX ADVANCE that Prior Authorization for Glatopa 40MG /ML syringes has been APPROVED from 09/12/2023 to 09/11/2024   PA #/Case ID/Reference #: PA Case ID #: [id number]

## 2023-09-29 ENCOUNTER — Encounter: Payer: Self-pay | Admitting: Neurology

## 2023-09-29 DIAGNOSIS — I639 Cerebral infarction, unspecified: Secondary | ICD-10-CM | POA: Diagnosis not present

## 2023-09-29 DIAGNOSIS — G35 Multiple sclerosis: Secondary | ICD-10-CM | POA: Diagnosis not present

## 2023-10-03 ENCOUNTER — Telehealth: Payer: Self-pay

## 2023-10-03 ENCOUNTER — Encounter: Payer: Self-pay | Admitting: Neurology

## 2023-10-03 ENCOUNTER — Other Ambulatory Visit (HOSPITAL_COMMUNITY): Payer: Self-pay

## 2023-10-03 DIAGNOSIS — G35 Multiple sclerosis: Secondary | ICD-10-CM

## 2023-10-03 DIAGNOSIS — Z5181 Encounter for therapeutic drug level monitoring: Secondary | ICD-10-CM

## 2023-10-03 NOTE — Telephone Encounter (Signed)
 I was asked to do a PA for tramadol-the rx in chart is for a 7 day supply-Is this what you wish me to submit for or will the PT be getting a 30DS or 90DS wit their mail order? Please advise-Thanks

## 2023-10-06 MED ORDER — TRAMADOL HCL 50 MG PO TABS
50.0000 mg | ORAL_TABLET | Freq: Three times a day (TID) | ORAL | 0 refills | Status: DC | PRN
Start: 2023-10-06 — End: 2024-04-26

## 2023-10-06 NOTE — Telephone Encounter (Signed)
 Meds ordered this encounter  Medications   traMADol (ULTRAM) 50 MG tablet    Sig: Take 1 tablet (50 mg total) by mouth 3 (three) times daily as needed.    Dispense:  270 tablet    Refill:  0

## 2023-10-06 NOTE — Addendum Note (Signed)
 Addended by: Glean Salvo on: 10/06/2023 12:29 PM   Modules accepted: Orders

## 2023-10-08 ENCOUNTER — Other Ambulatory Visit (HOSPITAL_COMMUNITY): Payer: Self-pay

## 2023-10-08 NOTE — Telephone Encounter (Signed)
     Per test claim PT got a 5 DS today and the rest should be able to be filled on 10/12/2023. Per CMM no PA needed for this medication. If PT has any further issues please let me know and I will call insurance plan. Thanks.

## 2023-10-17 LAB — LAB REPORT - SCANNED
A1c: 6.7
EGFR: 101

## 2023-11-07 ENCOUNTER — Other Ambulatory Visit: Payer: Self-pay | Admitting: Neurology

## 2023-11-07 NOTE — Telephone Encounter (Signed)
 Last seen 07/08/23, next appt 07/08/24 Dispenses    Dispensed Days Supply Quantity Provider Pharmacy  ALPRAZOLAM  1MG  TABLETS 08/07/2023 90 90 each Gayland Lauraine PARAS, NP [facility]  ALPRAZOLAM  1MG  TABLETS 04/10/2023 90 90 each Gayland Lauraine PARAS, NP [facility]  ALPRAZOLAM  1MG  TABLETS 12/26/2022 90 90 each Gayland Lauraine PARAS, NP [facility]

## 2023-11-19 ENCOUNTER — Other Ambulatory Visit: Payer: Self-pay | Admitting: Internal Medicine

## 2023-11-28 DIAGNOSIS — G35 Multiple sclerosis: Secondary | ICD-10-CM | POA: Diagnosis not present

## 2023-11-28 DIAGNOSIS — I639 Cerebral infarction, unspecified: Secondary | ICD-10-CM | POA: Diagnosis not present

## 2023-12-20 ENCOUNTER — Other Ambulatory Visit: Payer: Self-pay | Admitting: Neurology

## 2023-12-24 DIAGNOSIS — I639 Cerebral infarction, unspecified: Secondary | ICD-10-CM | POA: Diagnosis not present

## 2023-12-24 DIAGNOSIS — G35 Multiple sclerosis: Secondary | ICD-10-CM | POA: Diagnosis not present

## 2023-12-28 DIAGNOSIS — G35 Multiple sclerosis: Secondary | ICD-10-CM | POA: Diagnosis not present

## 2023-12-28 DIAGNOSIS — I639 Cerebral infarction, unspecified: Secondary | ICD-10-CM | POA: Diagnosis not present

## 2024-01-15 DIAGNOSIS — Z85828 Personal history of other malignant neoplasm of skin: Secondary | ICD-10-CM | POA: Diagnosis not present

## 2024-01-15 DIAGNOSIS — Z08 Encounter for follow-up examination after completed treatment for malignant neoplasm: Secondary | ICD-10-CM | POA: Diagnosis not present

## 2024-01-15 DIAGNOSIS — B078 Other viral warts: Secondary | ICD-10-CM | POA: Diagnosis not present

## 2024-01-24 DIAGNOSIS — I639 Cerebral infarction, unspecified: Secondary | ICD-10-CM | POA: Diagnosis not present

## 2024-01-24 DIAGNOSIS — G35 Multiple sclerosis: Secondary | ICD-10-CM | POA: Diagnosis not present

## 2024-01-28 DIAGNOSIS — G35 Multiple sclerosis: Secondary | ICD-10-CM | POA: Diagnosis not present

## 2024-01-28 DIAGNOSIS — I639 Cerebral infarction, unspecified: Secondary | ICD-10-CM | POA: Diagnosis not present

## 2024-02-08 ENCOUNTER — Other Ambulatory Visit: Payer: Self-pay | Admitting: Neurology

## 2024-02-23 DIAGNOSIS — G35 Multiple sclerosis: Secondary | ICD-10-CM | POA: Diagnosis not present

## 2024-02-23 DIAGNOSIS — I639 Cerebral infarction, unspecified: Secondary | ICD-10-CM | POA: Diagnosis not present

## 2024-03-08 DIAGNOSIS — E119 Type 2 diabetes mellitus without complications: Secondary | ICD-10-CM | POA: Diagnosis not present

## 2024-03-08 DIAGNOSIS — E1121 Type 2 diabetes mellitus with diabetic nephropathy: Secondary | ICD-10-CM | POA: Diagnosis not present

## 2024-03-08 DIAGNOSIS — E118 Type 2 diabetes mellitus with unspecified complications: Secondary | ICD-10-CM | POA: Diagnosis not present

## 2024-03-08 DIAGNOSIS — E1165 Type 2 diabetes mellitus with hyperglycemia: Secondary | ICD-10-CM | POA: Diagnosis not present

## 2024-03-12 DIAGNOSIS — I10 Essential (primary) hypertension: Secondary | ICD-10-CM | POA: Diagnosis not present

## 2024-03-12 DIAGNOSIS — E559 Vitamin D deficiency, unspecified: Secondary | ICD-10-CM | POA: Diagnosis not present

## 2024-03-12 DIAGNOSIS — Z86718 Personal history of other venous thrombosis and embolism: Secondary | ICD-10-CM | POA: Diagnosis not present

## 2024-03-12 DIAGNOSIS — I700 Atherosclerosis of aorta: Secondary | ICD-10-CM | POA: Diagnosis not present

## 2024-03-12 DIAGNOSIS — Z79899 Other long term (current) drug therapy: Secondary | ICD-10-CM | POA: Diagnosis not present

## 2024-03-12 DIAGNOSIS — E782 Mixed hyperlipidemia: Secondary | ICD-10-CM | POA: Diagnosis not present

## 2024-03-12 DIAGNOSIS — I69354 Hemiplegia and hemiparesis following cerebral infarction affecting left non-dominant side: Secondary | ICD-10-CM | POA: Diagnosis not present

## 2024-03-12 DIAGNOSIS — Z1211 Encounter for screening for malignant neoplasm of colon: Secondary | ICD-10-CM | POA: Diagnosis not present

## 2024-03-12 DIAGNOSIS — E1121 Type 2 diabetes mellitus with diabetic nephropathy: Secondary | ICD-10-CM | POA: Diagnosis not present

## 2024-03-12 DIAGNOSIS — Z0000 Encounter for general adult medical examination without abnormal findings: Secondary | ICD-10-CM | POA: Diagnosis not present

## 2024-03-12 DIAGNOSIS — F331 Major depressive disorder, recurrent, moderate: Secondary | ICD-10-CM | POA: Diagnosis not present

## 2024-03-25 DIAGNOSIS — I639 Cerebral infarction, unspecified: Secondary | ICD-10-CM | POA: Diagnosis not present

## 2024-03-25 DIAGNOSIS — G35 Multiple sclerosis: Secondary | ICD-10-CM | POA: Diagnosis not present

## 2024-03-29 DIAGNOSIS — E119 Type 2 diabetes mellitus without complications: Secondary | ICD-10-CM | POA: Diagnosis not present

## 2024-03-29 DIAGNOSIS — E118 Type 2 diabetes mellitus with unspecified complications: Secondary | ICD-10-CM | POA: Diagnosis not present

## 2024-03-29 DIAGNOSIS — E1121 Type 2 diabetes mellitus with diabetic nephropathy: Secondary | ICD-10-CM | POA: Diagnosis not present

## 2024-03-29 DIAGNOSIS — I10 Essential (primary) hypertension: Secondary | ICD-10-CM | POA: Diagnosis not present

## 2024-03-29 DIAGNOSIS — E1165 Type 2 diabetes mellitus with hyperglycemia: Secondary | ICD-10-CM | POA: Diagnosis not present

## 2024-04-24 DIAGNOSIS — I639 Cerebral infarction, unspecified: Secondary | ICD-10-CM | POA: Diagnosis not present

## 2024-04-24 DIAGNOSIS — G35 Multiple sclerosis: Secondary | ICD-10-CM | POA: Diagnosis not present

## 2024-04-26 ENCOUNTER — Other Ambulatory Visit: Payer: Self-pay

## 2024-04-26 DIAGNOSIS — Z5181 Encounter for therapeutic drug level monitoring: Secondary | ICD-10-CM

## 2024-04-26 DIAGNOSIS — G35 Multiple sclerosis: Secondary | ICD-10-CM

## 2024-04-26 MED ORDER — TRAMADOL HCL 50 MG PO TABS
50.0000 mg | ORAL_TABLET | Freq: Three times a day (TID) | ORAL | 0 refills | Status: DC | PRN
Start: 2024-04-26 — End: 2024-08-03

## 2024-04-26 NOTE — Telephone Encounter (Signed)
 Requested Prescriptions   Pending Prescriptions Disp Refills   traMADol  (ULTRAM ) 50 MG tablet 270 tablet 0    Sig: Take 1 tablet (50 mg total) by mouth 3 (three) times daily as needed.   Last seen 07/08/23, next 07/08/25  Dispenses   Dispensed Days Supply Quantity Provider Pharmacy  TRAMADOL  50MG  TABLETS 01/22/2024 90 270 each Gayland Lauraine PARAS, NP [facility]  TRAMADOL  50MG  TABLETS 10/24/2023 80 240 each Sater, Charlie LABOR, MD [facility]  TRAMADOL  50MG  TABLETS 10/14/2023 5 15 each Sater, Charlie LABOR, MD [facility]  TRAMADOL  50MG  TABLETS 10/08/2023 5 15 each Sater, Charlie LABOR, MD [facility]  traMADol  (ULTRAM ) tablet 50 mg 09/16/2023 90 270 Sater, Charlie LABOR, MD [facility]  TRAMADOL  50MG  TABLETS 05/21/2023 7 21 each Gayland Lauraine PARAS, NP [facility]

## 2024-04-29 DIAGNOSIS — E119 Type 2 diabetes mellitus without complications: Secondary | ICD-10-CM | POA: Diagnosis not present

## 2024-04-29 DIAGNOSIS — I10 Essential (primary) hypertension: Secondary | ICD-10-CM | POA: Diagnosis not present

## 2024-04-29 DIAGNOSIS — E1121 Type 2 diabetes mellitus with diabetic nephropathy: Secondary | ICD-10-CM | POA: Diagnosis not present

## 2024-05-07 DIAGNOSIS — Z1211 Encounter for screening for malignant neoplasm of colon: Secondary | ICD-10-CM | POA: Diagnosis not present

## 2024-05-11 DIAGNOSIS — E1165 Type 2 diabetes mellitus with hyperglycemia: Secondary | ICD-10-CM | POA: Diagnosis not present

## 2024-05-11 DIAGNOSIS — E1121 Type 2 diabetes mellitus with diabetic nephropathy: Secondary | ICD-10-CM | POA: Diagnosis not present

## 2024-05-11 DIAGNOSIS — E119 Type 2 diabetes mellitus without complications: Secondary | ICD-10-CM | POA: Diagnosis not present

## 2024-05-11 DIAGNOSIS — E118 Type 2 diabetes mellitus with unspecified complications: Secondary | ICD-10-CM | POA: Diagnosis not present

## 2024-05-18 ENCOUNTER — Other Ambulatory Visit: Payer: Self-pay | Admitting: Neurology

## 2024-05-18 ENCOUNTER — Other Ambulatory Visit: Payer: Self-pay

## 2024-05-18 NOTE — Telephone Encounter (Signed)
 Pt Last Seen 07/08/2023 Upcoming Appointment 07/08/2024  Xanax  Last filled 02/09/2024 90 day supply

## 2024-05-24 ENCOUNTER — Encounter: Payer: Self-pay | Admitting: Neurology

## 2024-05-25 ENCOUNTER — Telehealth: Payer: Self-pay | Admitting: Internal Medicine

## 2024-05-25 DIAGNOSIS — I639 Cerebral infarction, unspecified: Secondary | ICD-10-CM | POA: Diagnosis not present

## 2024-05-25 DIAGNOSIS — G35 Multiple sclerosis: Secondary | ICD-10-CM | POA: Diagnosis not present

## 2024-05-25 NOTE — Telephone Encounter (Signed)
 Pt scheduled to see Dr Abran 06/16/24@4pm . Pt aware of appt.

## 2024-05-25 NOTE — Telephone Encounter (Signed)
 Patient called and stated that she would like to schedule an appointment with Dr. Abran due to a referral that she has for BIS. Patient stated that she suffers from MS and Dr. Abran normally checks her the best her can in her wheel chair. Patient then stated that she feels nor comfortable seeing Dr. Abran instead of his PA. Patient is requesting to speak to the nurse to see if Dr. Abran is willing to squeeze her in in order to be seen sooner. Please advise.

## 2024-05-30 DIAGNOSIS — E1165 Type 2 diabetes mellitus with hyperglycemia: Secondary | ICD-10-CM | POA: Diagnosis not present

## 2024-05-30 DIAGNOSIS — I10 Essential (primary) hypertension: Secondary | ICD-10-CM | POA: Diagnosis not present

## 2024-05-30 DIAGNOSIS — E1121 Type 2 diabetes mellitus with diabetic nephropathy: Secondary | ICD-10-CM | POA: Diagnosis not present

## 2024-05-30 DIAGNOSIS — E119 Type 2 diabetes mellitus without complications: Secondary | ICD-10-CM | POA: Diagnosis not present

## 2024-05-30 DIAGNOSIS — E118 Type 2 diabetes mellitus with unspecified complications: Secondary | ICD-10-CM | POA: Diagnosis not present

## 2024-06-10 DIAGNOSIS — E119 Type 2 diabetes mellitus without complications: Secondary | ICD-10-CM | POA: Diagnosis not present

## 2024-06-10 DIAGNOSIS — E1165 Type 2 diabetes mellitus with hyperglycemia: Secondary | ICD-10-CM | POA: Diagnosis not present

## 2024-06-10 DIAGNOSIS — E118 Type 2 diabetes mellitus with unspecified complications: Secondary | ICD-10-CM | POA: Diagnosis not present

## 2024-06-10 DIAGNOSIS — E1121 Type 2 diabetes mellitus with diabetic nephropathy: Secondary | ICD-10-CM | POA: Diagnosis not present

## 2024-06-16 ENCOUNTER — Ambulatory Visit (INDEPENDENT_AMBULATORY_CARE_PROVIDER_SITE_OTHER): Payer: PRIVATE HEALTH INSURANCE | Admitting: Internal Medicine

## 2024-06-16 ENCOUNTER — Encounter: Payer: Self-pay | Admitting: Internal Medicine

## 2024-06-16 VITALS — BP 134/82

## 2024-06-16 DIAGNOSIS — Z8619 Personal history of other infectious and parasitic diseases: Secondary | ICD-10-CM

## 2024-06-16 DIAGNOSIS — R195 Other fecal abnormalities: Secondary | ICD-10-CM | POA: Diagnosis not present

## 2024-06-16 DIAGNOSIS — K754 Autoimmune hepatitis: Secondary | ICD-10-CM

## 2024-06-16 NOTE — Progress Notes (Signed)
 HISTORY OF PRESENT ILLNESS:  Gabriela Rice is a 68 y.o. female with multiple medical problems, including but not limited to, severe multiple sclerosis for which she is wheelchair-bound, prior history of stroke for which she is on chronic anticoagulation, morbid obesity, diabetes mellitus on insulin , and hypertension.  She is sent today by her PCP regarding routine Hemoccult testing which returned positive.  Patient has been followed in this office for biopsy supported autoimmune hepatitis for which she is on Imuran  150 mg daily.  Normalization of liver test.  Prior liver biopsy with fatty liver.  Heterozygous for hemochromatosis mutations.  Also, colonoscopies in 2005 and 2011 were both negative for neoplasia.  She is not a candidate for screening colonoscopies due to her comorbid conditions.  This was all outlined in her last office visit June 2023.  Patient tells me that as part of her routine evaluation she underwent Hemoccult testing.  These returned positive.  They were told to schedule this appointment.  Review of blood work from January 2025 shows normal liver tests.  CT scan of the abdomen and pelvis with contrast May 2024 to evaluate your complaints.  Incidental cholelithiasis.  Large amount of stool in the distal sigmoid colon and rectum consistent with fecal impaction.  This was treated.  No other significant intra-abdominal abnormalities.  Patient is accompanied today by her husband.  She tells me that she moves her bowels about once every 3 days.  She becomes bloated prior to bowel movements.  She does use MiraLAX  inconsistently.  No bleeding.  REVIEW OF SYSTEMS:  All non-GI ROS noncontributory  Past Medical History:  Diagnosis Date   Abnormality of gait 02/16/2013   Acute bilateral ankle pain 11/15/2019   Allergic rhinitis    Allergy    Anxiety    Autoimmune hepatitis (HCC)    Autoimmune hepatitis (HCC)    Colon polyps    Diverticulosis    DM (diabetes mellitus) (HCC)     Elevated LFTs    Hemorrhoids    Hemorrhoids    Hepatitis    with cholestasis   Hyperlipidemia    Hypertension    Migraines    Multiple sclerosis (HCC)    Obesity    Vitamin D deficiency     Past Surgical History:  Procedure Laterality Date   ABDOMINAL HYSTERECTOMY     CESAREAN SECTION     x 2   MYOECTOMY     TONSILLECTOMY AND ADENOIDECTOMY      Social History Gabriela Rice  reports that she quit smoking about 28 years ago. Her smoking use included cigarettes. She started smoking about 32 years ago. She has a 1 pack-year smoking history. She has never used smokeless tobacco. She reports that she does not drink alcohol  and does not use drugs.  family history includes Brain cancer in her maternal uncle; Cirrhosis in her father; Colon cancer in her maternal aunt and maternal uncle; Diabetes in her mother; Heart disease in her maternal grandmother; Multiple sclerosis in her mother; Uterine cancer in her maternal aunt.  Allergies  Allergen Reactions   Penicillins Other (See Comments)    seizure as child Has patient had a PCN reaction causing immediate rash, facial/tongue/throat swelling, SOB or lightheadedness with hypotension: No Has patient had a PCN reaction causing severe rash involving mucus membranes or skin necrosis:No Has patient had a PCN reaction that required hospitalizationNo Has patient had a PCN reaction occurring within the last 10 years:No If all of the above answers are NO,  then may proceed with Cephalosporin use.     Crestor [Rosuvastatin]     Other reaction(s): Elevated LFT's   Lipitor [Atorvastatin ]     Other reaction(s): muscle pain   Lisinopril     Other reaction(s): cough   Sulfamethoxazole -Trimethoprim      Other reaction(s): Dizziness, Loss of feeling in tongue   Zocor [Simvastatin]     Other reaction(s): muscle pain   Codeine Swelling    hands swell       PHYSICAL EXAMINATION: Vital signs: BP 134/82 (BP Location: Right Arm, Patient  Position: Sitting, Cuff Size: Normal)  General: Pleasant, chronically ill-appearing, wheelchair-bound obese female, well-nourished, no acute distress HEENT: Sclerae are anicteric, conjunctiva pink. Oral mucosa intact Lungs: Clear Heart: Regular Abdomen: soft, nontender, nondistended, no obvious ascites, no peritoneal signs, normal bowel sounds. No organomegaly. Extremities: No clubbing, cyanosis, or edema Psychiatric: alert and oriented x3. Cooperative   ASSESSMENT:  1.  Heme positive stool.  Asymptomatic. 2.  Colonoscopy 2005 and 2011 were both negative for neoplasia. 3.  History of hepatitis.  Normal liver test on Imuran  4.  Multiple significant medical problems and limitations.  Not a candidate for colonoscopy--extremely high risk   PLAN:  1.  We discussed the relevance of Hemoccult positive testing.  We discussed true positive versus false positive results.  We discussed her physical and medical limitations regarding colonoscopy, as well as its high risk nature.  We discussed that screening tools other than colonoscopy should not be used if the patient is not a candidate for colonoscopy. 2.  As an option, we discussed virtual colonoscopy to rule out gross colonic lesions.  She has concerns regarding her mobility.  I agree.  I have provided a piece of paper with virtual colonoscopy scribed.  They will research and consider such.  I do not feel strongly, given her limitations.  They understand. 3.  Continue Imuran  150 mg daily 4.  Continue periodic blood work with PCP A total time of 40 minutes was spent preparing to see the patient, obtaining interval history, performing medically appropriate physical exam, counseling and educating the patient and her husband regarding the above listed issues, and documenting clinical information in the health record

## 2024-06-16 NOTE — Patient Instructions (Signed)
 Please follow up as needed.  _______________________________________________________  If your blood pressure at your visit was 140/90 or greater, please contact your primary care physician to follow up on this.  _______________________________________________________  If you are age 68 or older, your body mass index should be between 23-30. Your There is no height or weight on file to calculate BMI. If this is out of the aforementioned range listed, please consider follow up with your Primary Care Provider.  If you are age 65 or younger, your body mass index should be between 19-25. Your There is no height or weight on file to calculate BMI. If this is out of the aformentioned range listed, please consider follow up with your Primary Care Provider.   ________________________________________________________  The [facility] providers would like to encourage you to use MYCHART to communicate with providers for non-urgent requests or questions.  Due to long hold times on the telephone, sending your provider a message by [facility] may be a faster and more efficient way to get a response.  Please allow 48 business hours for a response.  Please remember that this is for non-urgent requests.  _______________________________________________________  [facility] is using a team-based approach to care.  Your team is made up of your doctor and two to three APPS. Our APPS (Nurse Practitioners and Physician Assistants) work with your physician to ensure care continuity for you. They are fully qualified to address your health concerns and develop a treatment plan. They communicate directly with your gastroenterologist to care for you. Seeing the Advanced Practice Practitioners on your physician's team can help you by facilitating care more promptly, often allowing for earlier appointments, access to diagnostic testing, procedures, and other specialty referrals.

## 2024-06-25 DIAGNOSIS — G35 Multiple sclerosis: Secondary | ICD-10-CM | POA: Diagnosis not present

## 2024-06-25 DIAGNOSIS — I639 Cerebral infarction, unspecified: Secondary | ICD-10-CM | POA: Diagnosis not present

## 2024-06-29 DIAGNOSIS — E119 Type 2 diabetes mellitus without complications: Secondary | ICD-10-CM | POA: Diagnosis not present

## 2024-06-29 DIAGNOSIS — E1121 Type 2 diabetes mellitus with diabetic nephropathy: Secondary | ICD-10-CM | POA: Diagnosis not present

## 2024-06-29 DIAGNOSIS — E1165 Type 2 diabetes mellitus with hyperglycemia: Secondary | ICD-10-CM | POA: Diagnosis not present

## 2024-06-29 DIAGNOSIS — I10 Essential (primary) hypertension: Secondary | ICD-10-CM | POA: Diagnosis not present

## 2024-06-29 DIAGNOSIS — E118 Type 2 diabetes mellitus with unspecified complications: Secondary | ICD-10-CM | POA: Diagnosis not present

## 2024-07-08 ENCOUNTER — Telehealth: Payer: PPO | Admitting: Neurology

## 2024-07-08 DIAGNOSIS — I63411 Cerebral infarction due to embolism of right middle cerebral artery: Secondary | ICD-10-CM

## 2024-07-08 DIAGNOSIS — I69959 Hemiplegia and hemiparesis following unspecified cerebrovascular disease affecting unspecified side: Secondary | ICD-10-CM | POA: Diagnosis not present

## 2024-07-08 DIAGNOSIS — R29898 Other symptoms and signs involving the musculoskeletal system: Secondary | ICD-10-CM

## 2024-07-08 DIAGNOSIS — Z5181 Encounter for therapeutic drug level monitoring: Secondary | ICD-10-CM | POA: Diagnosis not present

## 2024-07-08 DIAGNOSIS — G35D Multiple sclerosis, unspecified: Secondary | ICD-10-CM | POA: Diagnosis not present

## 2024-07-08 DIAGNOSIS — Z993 Dependence on wheelchair: Secondary | ICD-10-CM | POA: Diagnosis not present

## 2024-07-08 NOTE — Progress Notes (Signed)
 [facility]  PATIENT: Gabriela Rice DOB: 1956-07-23  REFERRING DOCTOR OR PCP:   Reena Duck, MD SOURCE: Patient, notes from Dr. Jenel, imaging and lab reports, MRI images personally reviewed.  _________________________________   HISTORICAL  CHIEF COMPLAINT:  Chief Complaint  Patient presents with   Multiple Sclerosis    HISTORY OF PRESENT ILLNESS:  Gabriela Rice is a 68 y.o. woman with multiple sclerosis.  She had been seeing Dr. Francis Jenel and is transferring care upon his retirement.  Virtual Visit via Video Note I connected with Gabriela Rice on 07/08/24 at  1:00 PM EDT by a video enabled telemedicine application and verified that I am speaking with the correct person.  I discussed the limitations of evaluation and management by telemedicine and the availability of in person appointments. The patient expressed understanding and agreed to proceed.   Update 07/08/2024 She is currently on Copaxone .     Currently, she is wheelchair bound and does not take steps. Seh has a new wheelchair;.   She needs a Nurse, adult for transfers.    Both legs  are weak, left worse than right.  She cannot bear weight.   She has some spasticity in legs, left > right.   She has just mild weakness in the left arm.  She is on baclofen  but not sure it is helping.  She has numbness in her legs.     She has urinary incontinence.   She cannot get to a toilet so needs to use Depends.      Her vision is a little worse and we discussed her seeing ophthalmology.    She has never had ON.  She has fatigue.  She sleeps well most nights.   She notes mild reduced short term memory.   She has no trouble coming up with the right word.    She has some depression and is sometimes irritable.     She has autoimmune hepatitis and is on azathioprine   She is on gabapentin  now just taking 2/day and tramadol  prn for pain.     She would like to cut the gabapentin  down to qHS   PE:   She is  a well-developed well-nourished woman in no acute distress.  The head is normocephalic and atraumatic.  Sclera are anicteric.  Visible skin appears normal.  The neck has a good range of motion.  She is alert and oriented with fluent speech and good attention.  Extraocular muscles are intact.  Facial strength is normal.  Palatal elevation and tongue protrusion are midline.  She can move both arms.   Unable to stand.  Rapid alternating movements and finger-nose-finger are performed well.   MS HISTORY She was diagnosed with MS in 1998 afer presenting with slurred speech, weakness and poor gait.   She saw Dr. Jenel initially and was placed on Betaseron.  Her LFTs increased and she discontinued.   She took nothing for several years and then started Copaxone  around 2000 or 2001.   At that time, she was walking fairly well.   She began to have more difficulty with her gait and needed a cane by 2010 and a walker a few years later.    After she broke her ankles in August  2020 she became unable to walk and has been wheelchair bound since.   She had a stroke December 2020 and had more lest arm weakness and slurred speech.    Her strength improved but not to baseline.  The embolic right parietal stroke was felt to be due to PFO and DVT in leg.   Since the stroke, she has been on Eliquis   REVIEW OF SYSTEMS: Constitutional: No fevers, chills, sweats, or change in appetite.  She has fatigue. Eyes: No visual changes, double vision, eye pain Ear, nose and throat: No hearing loss, ear pain, nasal congestion, sore throat Cardiovascular: No chest pain, palpitations Respiratory:  No shortness of breath at rest or with exertion.   No wheezes GastrointestinaI: No nausea, vomiting, diarrhea, abdominal pain, fecal incontinence.  She has autoimmune hepatitis. Genitourinary:  No dysuria, urinary retention or frequency.  No nocturia. Musculoskeletal:  No neck pain, back pain Integumentary: No rash, pruritus, skin  lesions Neurological: as above Psychiatric: Notes mild depression at this time.  No anxiety Endocrine: No palpitations, diaphoresis, change in appetite, change in weigh or increased thirst Hematologic/Lymphatic:  No anemia, purpura, petechiae. Allergic/Immunologic: No itchy/runny eyes, nasal congestion, recent allergic reactions, rashes  ALLERGIES: Allergies  Allergen Reactions   Penicillins Other (See Comments)    seizure as child Has patient had a PCN reaction causing immediate rash, facial/tongue/throat swelling, SOB or lightheadedness with hypotension: No Has patient had a PCN reaction causing severe rash involving mucus membranes or skin necrosis:No Has patient had a PCN reaction that required hospitalizationNo Has patient had a PCN reaction occurring within the last 10 years:No If all of the above answers are NO, then may proceed with Cephalosporin use.     Crestor [Rosuvastatin]     Other reaction(s): Elevated LFT's   Lipitor [Atorvastatin ]     Other reaction(s): muscle pain   Lisinopril     Other reaction(s): cough   Sulfamethoxazole -Trimethoprim      Other reaction(s): Dizziness, Loss of feeling in tongue   Zocor [Simvastatin]     Other reaction(s): muscle pain   Codeine Swelling    hands swell    HOME MEDICATIONS:  Current Outpatient Medications:    ALPRAZolam  (XANAX ) 1 MG tablet, TAKE 1 TABLET(1 MG) BY MOUTH AT BEDTIME AS NEEDED, Disp: 90 tablet, Rfl: 1   apixaban  (ELIQUIS ) 5 MG TABS tablet, TAKE 1 TABLET(5 MG) BY MOUTH TWICE DAILY, Disp: 180 tablet, Rfl: 3   azaTHIOprine  (IMURAN ) 50 MG tablet, TAKE 3 TABLETS(150 MG) BY MOUTH DAILY (Patient taking differently: Take 100 mg by mouth daily.), Disp: 270 tablet, Rfl: 1   baclofen  (LIORESAL ) 10 MG tablet, TAKE 1 TABLET BY MOUTH EVERY MORNING, 1 1/2 TABLET MIDDAY, AND 1 TABLET EVERY EVENING., Disp: 315 tablet, Rfl: 3   cetirizine (ZYRTEC) 10 MG tablet, Take 10 mg by mouth daily., Disp: , Rfl:    Cholecalciferol  (VITAMIN  D) 2000 UNITS CAPS, Take 2,000 Units by mouth daily. , Disp: , Rfl:    DULoxetine  (CYMBALTA ) 30 MG capsule, Take 90 mg by mouth daily. , Disp: , Rfl:    ezetimibe (ZETIA) 10 MG tablet, Take 10 mg by mouth daily., Disp: , Rfl:    gabapentin  (NEURONTIN ) 300 MG capsule, TAKE 1 CAPSULE BY MOUTH THREE TIMES DAILY, Disp: 270 capsule, Rfl: 3   Glatiramer  Acetate 40 MG/ML SOSY, GIVE 40MG  ( ) SUBCUTANEOUSLY THREE TIMES A WEEK, Disp: 12 mL, Rfl: 11   insulin  lispro (HUMALOG) 100 UNIT/ML KwikPen, Inject 2-8 Units into the skin as needed. Sliding units, Disp: , Rfl:    ketoconazole (NIZORAL) 2 % cream, Apply 1 Application topically daily., Disp: , Rfl:    losartan  (COZAAR ) 100 MG tablet, Take 100 mg by mouth daily., Disp: , Rfl:  metFORMIN  (GLUCOPHAGE ) 500 MG tablet, Take 500 mg by mouth 2 (two) times daily with a meal. , Disp: , Rfl:    ONETOUCH ULTRA test strip, 2 (two) times daily as needed., Disp: , Rfl:    OZEMPIC, 2 MG/DOSE, 8 MG/3ML SOPN, Inject 2 mg into the skin once a week., Disp: , Rfl:    polyethylene glycol powder (GLYCOLAX /MIRALAX ) 17 GM/SCOOP powder, Take 17 g by mouth daily as needed for mild constipation., Disp: , Rfl:    pravastatin  (PRAVACHOL ) 80 MG tablet, Take 80 mg by mouth daily., Disp: , Rfl:    traMADol  (ULTRAM ) 50 MG tablet, Take 1 tablet (50 mg total) by mouth 3 (three) times daily as needed., Disp: 270 tablet, Rfl: 0  PAST MEDICAL HISTORY: Past Medical History:  Diagnosis Date   Abnormality of gait 02/16/2013   Acute bilateral ankle pain 11/15/2019   Allergic rhinitis    Allergy    Anxiety    Autoimmune hepatitis (HCC)    Autoimmune hepatitis (HCC)    Colon polyps    Diverticulosis    DM (diabetes mellitus) (HCC)    Elevated LFTs    Hemorrhoids    Hemorrhoids    Hepatitis    with cholestasis   Hyperlipidemia    Hypertension    Migraines    Multiple sclerosis    Obesity    Vitamin D deficiency     PAST SURGICAL HISTORY: Past Surgical History:  Procedure  Laterality Date   ABDOMINAL HYSTERECTOMY     CESAREAN SECTION     x 2   MYOECTOMY     TONSILLECTOMY AND ADENOIDECTOMY      FAMILY HISTORY: Family History  Problem Relation Age of Onset   Diabetes Mother    Multiple sclerosis Mother    Cirrhosis Father    Heart disease Maternal Grandmother    Colon cancer Maternal Aunt    Uterine cancer Maternal Aunt    Brain cancer Maternal Uncle    Colon cancer Maternal Uncle    Breast cancer Neg Hx     SOCIAL HISTORY: Social History   Socioeconomic History   Marital status: Married    Spouse name: Alm   Number of children: 2   Years of education: HS   Highest education level: Not on file  Occupational History   Occupation: Unemployed  Tobacco Use   Smoking status: Former    Current packs/day: 0.00    Average packs/day: 0.3 packs/day for 4.0 years (1.0 ttl pk-yrs)    Types: Cigarettes    Start date: 10/01/1991    Quit date: 10/01/1995    Years since quitting: 28.7   Smokeless tobacco: Never  Vaping Use   Vaping status: Never Used  Substance and Sexual Activity   Alcohol  use: No    Alcohol /week: 0.0 standard drinks of alcohol    Drug use: No   Sexual activity: Not on file  Other Topics Concern   Not on file  Social History Narrative   Lives at home w/ her husband   Right-handed   Drinks 1 cup of coffee per day   Social Drivers of Corporate investment banker Strain: Not on file  Food Insecurity: No Food Insecurity (02/03/2023)   Hunger Vital Sign    Worried About Running Out of Food in the Last Year: Never true    Ran Out of Food in the Last Year: Never true  Transportation Needs: No Transportation Needs (02/03/2023)   PRAPARE - Transportation    Lack of  Transportation (Medical): No    Lack of Transportation (Non-Medical): No  Physical Activity: Not on file  Stress: Not on file  Social Connections: Not on file  Intimate Partner Violence: Not At Risk (02/03/2023)   Humiliation, Afraid, Rape, and Kick questionnaire    Fear  of Current or Ex-Partner: No    Emotionally Abused: No    Physically Abused: No    Sexually Abused: No       PHYSICAL EXAM from 06/25/2022  There were no vitals filed for this visit.   There is no height or weight on file to calculate BMI.   General: The patient is well-developed and well-nourished and in no acute distress.  She is in a wheelchair  HEENT:  Head is [place]/AT.  Sclera are anicteric.     Neck: No carotid bruits are noted.  The neck is nontender.  Cardiovascular: The heart has a regular rate and rhythm with a normal S1 and S2. There were no murmurs, gallops or rubs.    Skin: Extremities are without rash or  edema.  Musculoskeletal:  Back is nontender  Neurologic Exam  Mental status: The patient is alert and oriented x 3 at the time of the examination. The patient has apparent normal recent and remote memory, with an apparently normal attention span and concentration ability.   Speech is normal.  Cranial nerves: Extraocular movements are full. Pupils are equal, round, and reactive to light and accomodation.  Color vision was symmetric.  Facial symmetry is present. There is good facial sensation to soft touch bilaterally.Facial strength is normal.  Trapezius and sternocleidomastoid strength is normal. No dysarthria is noted.  The tongue is midline, and the patient has symmetric elevation of the soft palate. No obvious hearing deficits are noted.  Motor:  Muscle bulk is normal.   Tone is increased in the legs.  Strength is 4++/5 in the left arm and 5/5 in the right arm.  Strength is 1/5 in the right leg and 0/5 in the left leg.  Sensory: Sensory testing is intact to pinprick, soft touch and vibration sensation in all 4 extremities.  Coordination: Cerebellar testing reveals good finger-nose-finger.  Cannot test legs.  Gait and station: She is unable to stand.  Reflexes: Deep tendon reflexes are symmetric and normal bilaterally.   Plantar responses are  flexor.    DIAGNOSTIC DATA (LABS, IMAGING, TESTING) - I reviewed patient records, labs, notes, testing and imaging myself where available.  Lab Results  Component Value Date   WBC 7.8 02/05/2023   HGB 12.5 02/05/2023   HCT 37.1 02/05/2023   MCV 91.6 02/05/2023   PLT 191 02/05/2023      Component Value Date/Time   NA 134 (L) 02/05/2023 0550   NA 138 12/18/2021 1457   NA 142 09/16/2014 1420   K 3.7 02/05/2023 0550   K 3.9 09/16/2014 1420   CL 99 02/05/2023 0550   CL 105 09/16/2014 1420   CO2 25 02/05/2023 0550   CO2 29 09/16/2014 1420   GLUCOSE 148 (H) 02/05/2023 0550   GLUCOSE 111 (H) 09/16/2014 1420   BUN 11 02/05/2023 0550   BUN 15 12/18/2021 1457   BUN 21 (H) 09/16/2014 1420   CREATININE 0.57 02/05/2023 0550   CREATININE 0.86 09/16/2014 1420   CALCIUM  8.9 02/05/2023 0550   CALCIUM  9.2 09/16/2014 1420   PROT 7.6 06/03/2023 1232   PROT 7.0 12/18/2021 1457   PROT 7.7 09/16/2014 1420   ALBUMIN 4.3 06/03/2023 1232   ALBUMIN  4.6 12/18/2021 1457   ALBUMIN 4.0 09/16/2014 1420   AST 33 06/03/2023 1232   AST 41 (H) 09/16/2014 1420   ALT 22 06/03/2023 1232   ALT 42 09/16/2014 1420   ALKPHOS 56 06/03/2023 1232   ALKPHOS 69 09/16/2014 1420   BILITOT 0.5 06/03/2023 1232   BILITOT 0.7 12/18/2021 1457   BILITOT 0.6 09/16/2014 1420   GFRNONAA >60 02/05/2023 0550   GFRNONAA >60 09/16/2014 1420   GFRAA >60 09/29/2019 0333   GFRAA >60 09/16/2014 1420   Lab Results  Component Value Date   CHOL 200 09/24/2019   HDL 52 09/24/2019   LDLCALC 125 (H) 09/24/2019   TRIG 114 09/24/2019   CHOLHDL 3.8 09/24/2019   Lab Results  Component Value Date   HGBA1C 7.0 (H) 02/03/2023   No results found for: VITAMINB12 Lab Results  Component Value Date   TSH 0.266 (L) 08/22/2015       ASSESSMENT AND PLAN  Multiple sclerosis  Encounter for therapeutic drug monitoring  Hemiplegia of nondominant side as late effect of cerebrovascular disease (HCC)  Cerebrovascular accident  (CVA) due to embolism of right middle cerebral artery (HCC)  Wheelchair dependence  Weakness of both legs  We discussed the natural history of MS.  She will stop the Copaxone  and we will follow clinically - getting out of house for MRI very difficult for her Can reduce the baclofen  and d/c if she notes no change in spasticity.   Reduce gabapentin  to just bedtime.   She had an embolic stroke, possibly due to a PFO and DVT.  She will remain on Eliquis . She will return in 7-8 months or sooner if there are new or worsening neurologic symptoms.  Follow Up Instructions: I discussed the assessment and treatment plan with the patient. The patient was provided an opportunity to ask questions and all were answered. The patient agreed with the plan and demonstrated an understanding of the instructions.    The patient was advised to call back or seek an in-person evaluation if the symptoms worsen or if the condition fails to improve as anticipated.  I provided 20 minutes of non-face-to-face time during this encounter.  This visit is part of a comprehensive longitudinal care medical relationship regarding the patients primary diagnosis of SPMS and related concerns.    Danell Vazquez A. Vear, MD, [facility] 07/08/2024, 1:26 PM Certified in Neurology, Clinical Neurophysiology, Sleep Medicine and Neuroimaging  [facility] [address], [address] [place], [place] [address] [phone]

## 2024-07-10 DIAGNOSIS — E118 Type 2 diabetes mellitus with unspecified complications: Secondary | ICD-10-CM | POA: Diagnosis not present

## 2024-07-10 DIAGNOSIS — E1121 Type 2 diabetes mellitus with diabetic nephropathy: Secondary | ICD-10-CM | POA: Diagnosis not present

## 2024-07-10 DIAGNOSIS — E1165 Type 2 diabetes mellitus with hyperglycemia: Secondary | ICD-10-CM | POA: Diagnosis not present

## 2024-07-10 DIAGNOSIS — E119 Type 2 diabetes mellitus without complications: Secondary | ICD-10-CM | POA: Diagnosis not present

## 2024-07-15 ENCOUNTER — Encounter: Payer: Self-pay | Admitting: Neurology

## 2024-07-19 ENCOUNTER — Encounter: Payer: Self-pay | Admitting: *Deleted

## 2024-07-25 DIAGNOSIS — I639 Cerebral infarction, unspecified: Secondary | ICD-10-CM | POA: Diagnosis not present

## 2024-07-27 ENCOUNTER — Ambulatory Visit: Payer: PRIVATE HEALTH INSURANCE | Admitting: Nurse Practitioner

## 2024-07-30 DIAGNOSIS — E1121 Type 2 diabetes mellitus with diabetic nephropathy: Secondary | ICD-10-CM | POA: Diagnosis not present

## 2024-07-30 DIAGNOSIS — E119 Type 2 diabetes mellitus without complications: Secondary | ICD-10-CM | POA: Diagnosis not present

## 2024-07-30 DIAGNOSIS — E1165 Type 2 diabetes mellitus with hyperglycemia: Secondary | ICD-10-CM | POA: Diagnosis not present

## 2024-07-30 DIAGNOSIS — E118 Type 2 diabetes mellitus with unspecified complications: Secondary | ICD-10-CM | POA: Diagnosis not present

## 2024-07-30 DIAGNOSIS — I10 Essential (primary) hypertension: Secondary | ICD-10-CM | POA: Diagnosis not present

## 2024-08-03 ENCOUNTER — Other Ambulatory Visit: Payer: Self-pay | Admitting: Neurology

## 2024-08-03 DIAGNOSIS — Z5181 Encounter for therapeutic drug level monitoring: Secondary | ICD-10-CM

## 2024-08-03 DIAGNOSIS — G35D Multiple sclerosis, unspecified: Secondary | ICD-10-CM

## 2024-08-03 NOTE — Telephone Encounter (Signed)
 Last seen on 07/08/24 Follow up 02/21/25   Dispensed Days Supply Quantity Provider Pharmacy  TRAMADOL  50MG  TABLETS 04/27/2024 90 270 each Sater, Charlie LABOR, MD [facility]...     Rx pending to be signed

## 2024-08-08 ENCOUNTER — Other Ambulatory Visit: Payer: Self-pay | Admitting: Neurology

## 2024-08-09 DIAGNOSIS — E1121 Type 2 diabetes mellitus with diabetic nephropathy: Secondary | ICD-10-CM | POA: Diagnosis not present

## 2024-08-09 DIAGNOSIS — E118 Type 2 diabetes mellitus with unspecified complications: Secondary | ICD-10-CM | POA: Diagnosis not present

## 2024-08-09 DIAGNOSIS — E1165 Type 2 diabetes mellitus with hyperglycemia: Secondary | ICD-10-CM | POA: Diagnosis not present

## 2024-08-09 DIAGNOSIS — E119 Type 2 diabetes mellitus without complications: Secondary | ICD-10-CM | POA: Diagnosis not present

## 2024-08-15 ENCOUNTER — Other Ambulatory Visit: Payer: Self-pay | Admitting: Internal Medicine

## 2024-09-03 ENCOUNTER — Encounter: Payer: Self-pay | Admitting: Internal Medicine

## 2024-09-03 NOTE — Telephone Encounter (Signed)
 Yes, that should be fine. Thanks for checking!

## 2025-02-22 ENCOUNTER — Ambulatory Visit: Admitting: Neurology

## 2025-03-01 ENCOUNTER — Ambulatory Visit: Admitting: Neurology
